# Patient Record
Sex: Female | Born: 1935 | Race: White | Hispanic: No | Smoking: Never smoker
Health system: Southern US, Community
[De-identification: ages and names within clinical notes are randomized; demographics above are authoritative.]

## PROBLEM LIST (undated history)

## (undated) DIAGNOSIS — F419 Anxiety disorder, unspecified: Secondary | ICD-10-CM

## (undated) DIAGNOSIS — I10 Essential (primary) hypertension: Secondary | ICD-10-CM

## (undated) DIAGNOSIS — Z950 Presence of cardiac pacemaker: Secondary | ICD-10-CM

## (undated) DIAGNOSIS — I340 Nonrheumatic mitral (valve) insufficiency: Secondary | ICD-10-CM

## (undated) DIAGNOSIS — I4891 Unspecified atrial fibrillation: Secondary | ICD-10-CM

## (undated) DIAGNOSIS — M1990 Unspecified osteoarthritis, unspecified site: Secondary | ICD-10-CM

## (undated) DIAGNOSIS — E785 Hyperlipidemia, unspecified: Secondary | ICD-10-CM

## (undated) DIAGNOSIS — E119 Type 2 diabetes mellitus without complications: Secondary | ICD-10-CM

## (undated) DIAGNOSIS — N189 Chronic kidney disease, unspecified: Secondary | ICD-10-CM

## (undated) DIAGNOSIS — F32A Depression, unspecified: Secondary | ICD-10-CM

## (undated) DIAGNOSIS — H3530 Unspecified macular degeneration: Secondary | ICD-10-CM

## (undated) DIAGNOSIS — K5792 Diverticulitis of intestine, part unspecified, without perforation or abscess without bleeding: Secondary | ICD-10-CM

## (undated) DIAGNOSIS — D649 Anemia, unspecified: Secondary | ICD-10-CM

## (undated) DIAGNOSIS — C50919 Malignant neoplasm of unspecified site of unspecified female breast: Secondary | ICD-10-CM

## (undated) DIAGNOSIS — K219 Gastro-esophageal reflux disease without esophagitis: Secondary | ICD-10-CM

## (undated) DIAGNOSIS — I639 Cerebral infarction, unspecified: Secondary | ICD-10-CM

## (undated) DIAGNOSIS — I499 Cardiac arrhythmia, unspecified: Secondary | ICD-10-CM

## (undated) DIAGNOSIS — I509 Heart failure, unspecified: Secondary | ICD-10-CM

## (undated) DIAGNOSIS — I341 Nonrheumatic mitral (valve) prolapse: Secondary | ICD-10-CM

## (undated) HISTORY — PX: INSERT / REPLACE / REMOVE PACEMAKER: SUR710

## (undated) HISTORY — PX: APPENDECTOMY: SHX54

## (undated) HISTORY — PX: CARDIAC CATHETERIZATION: SHX172

## (undated) HISTORY — DX: Chronic kidney disease, unspecified: N18.9

## (undated) HISTORY — DX: Anxiety disorder, unspecified: F41.9

## (undated) HISTORY — PX: TONSILLECTOMY: SUR1361

## (undated) HISTORY — PX: BREAST SURGERY: SHX581

## (undated) HISTORY — PX: EYE SURGERY: SHX253

## (undated) HISTORY — PX: ABDOMINAL HYSTERECTOMY: SHX81

## (undated) HISTORY — PX: LEFT OOPHORECTOMY: SHX1961

## (undated) HISTORY — DX: Essential (primary) hypertension: I10

## (undated) HISTORY — DX: Cerebral infarction, unspecified: I63.9

## (undated) HISTORY — DX: Depression, unspecified: F32.A

## (undated) HISTORY — PX: KNEE ARTHROSCOPY: SUR90

## (undated) HISTORY — PX: ESOPHAGEAL DILATION: SHX303

## (undated) HISTORY — PX: TEMPORAL ARTERY BIOPSY / LIGATION: SUR132

---

## 1976-04-06 DIAGNOSIS — C50919 Malignant neoplasm of unspecified site of unspecified female breast: Secondary | ICD-10-CM

## 1976-04-06 HISTORY — PX: MASTECTOMY: SHX3

## 1976-04-06 HISTORY — DX: Malignant neoplasm of unspecified site of unspecified female breast: C50.919

## 2004-06-25 ENCOUNTER — Ambulatory Visit: Payer: Self-pay | Admitting: Unknown Physician Specialty

## 2005-07-02 ENCOUNTER — Ambulatory Visit: Payer: Self-pay | Admitting: Internal Medicine

## 2007-10-03 ENCOUNTER — Ambulatory Visit: Payer: Self-pay | Admitting: Internal Medicine

## 2009-07-30 ENCOUNTER — Ambulatory Visit: Payer: Self-pay | Admitting: Internal Medicine

## 2009-12-23 ENCOUNTER — Ambulatory Visit: Payer: Self-pay | Admitting: Ophthalmology

## 2009-12-31 ENCOUNTER — Ambulatory Visit: Payer: Self-pay | Admitting: Ophthalmology

## 2010-01-03 ENCOUNTER — Ambulatory Visit: Payer: Self-pay | Admitting: Cardiovascular Disease

## 2010-12-10 ENCOUNTER — Ambulatory Visit: Payer: Self-pay | Admitting: Unknown Physician Specialty

## 2012-08-08 ENCOUNTER — Ambulatory Visit: Payer: Self-pay | Admitting: Cardiology

## 2012-08-08 DIAGNOSIS — I4891 Unspecified atrial fibrillation: Secondary | ICD-10-CM

## 2012-08-08 LAB — BASIC METABOLIC PANEL
Anion Gap: 18 — ABNORMAL HIGH (ref 7–16)
BUN: 18 mg/dL (ref 7–18)
Calcium, Total: 10.3 mg/dL — ABNORMAL HIGH (ref 8.5–10.1)
Chloride: 103 mmol/L (ref 98–107)
Co2: 17 mmol/L — ABNORMAL LOW (ref 21–32)
Creatinine: 0.72 mg/dL (ref 0.60–1.30)
EGFR (African American): >60
EGFR (Non-African Amer.): >60
Glucose: 228 mg/dL — ABNORMAL HIGH (ref 65–99)
Osmolality: 285 (ref 275–301)
Potassium: 4.2 mmol/L (ref 3.5–5.1)
Sodium: 138 mmol/L (ref 136–145)

## 2012-08-08 LAB — CBC WITH DIFFERENTIAL/PLATELET
Basophil #: 0.1 10*3/uL (ref 0.0–0.1)
Basophil %: 0.8 %
Eosinophil #: 0.1 10*3/uL (ref 0.0–0.7)
Eosinophil %: 1.2 %
HCT: 37.2 % (ref 35.0–47.0)
HGB: 12.6 g/dL (ref 12.0–16.0)
Lymphocyte #: 1.4 10*3/uL (ref 1.0–3.6)
Lymphocyte %: 20.2 %
MCH: 28.4 pg (ref 26.0–34.0)
MCHC: 34 g/dL (ref 32.0–36.0)
MCV: 84 fL (ref 80–100)
Monocyte #: 0.6 x10 3/mm (ref 0.2–0.9)
Monocyte %: 9 %
Neutrophil #: 4.6 10*3/uL (ref 1.4–6.5)
Neutrophil %: 68.8 %
Platelet: 258 10*3/uL (ref 150–440)
RBC: 4.45 10*6/uL (ref 3.80–5.20)
RDW: 13.9 % (ref 11.5–14.5)
WBC: 6.7 10*3/uL (ref 3.6–11.0)

## 2012-08-08 LAB — APTT: Activated PTT: 33.1 secs (ref 23.6–35.9)

## 2012-08-08 LAB — PROTIME-INR
INR: 1.4
Prothrombin Time: 17.3 secs — ABNORMAL HIGH (ref 11.5–14.7)

## 2012-08-11 ENCOUNTER — Ambulatory Visit: Payer: Self-pay | Admitting: Cardiology

## 2012-08-11 HISTORY — PX: PACEMAKER INSERTION: SHX728

## 2012-08-11 LAB — PROTIME-INR
INR: 1
Prothrombin Time: 13.2 secs (ref 11.5–14.7)

## 2012-09-30 DIAGNOSIS — I5030 Unspecified diastolic (congestive) heart failure: Secondary | ICD-10-CM | POA: Insufficient documentation

## 2012-09-30 DIAGNOSIS — I509 Heart failure, unspecified: Secondary | ICD-10-CM | POA: Insufficient documentation

## 2012-09-30 DIAGNOSIS — I340 Nonrheumatic mitral (valve) insufficiency: Secondary | ICD-10-CM | POA: Insufficient documentation

## 2012-11-29 ENCOUNTER — Encounter: Payer: Self-pay | Admitting: Internal Medicine

## 2012-12-05 ENCOUNTER — Encounter: Payer: Self-pay | Admitting: Internal Medicine

## 2013-01-04 ENCOUNTER — Encounter: Payer: Self-pay | Admitting: Internal Medicine

## 2013-01-30 ENCOUNTER — Ambulatory Visit: Payer: Self-pay | Admitting: Unknown Physician Specialty

## 2013-02-01 LAB — PATHOLOGY REPORT

## 2013-02-04 ENCOUNTER — Encounter: Payer: Self-pay | Admitting: Internal Medicine

## 2013-03-06 ENCOUNTER — Encounter: Payer: Self-pay | Admitting: Internal Medicine

## 2013-04-06 ENCOUNTER — Encounter: Payer: Self-pay | Admitting: Internal Medicine

## 2013-05-07 ENCOUNTER — Encounter: Payer: Self-pay | Admitting: Internal Medicine

## 2013-08-10 DIAGNOSIS — I4891 Unspecified atrial fibrillation: Secondary | ICD-10-CM | POA: Insufficient documentation

## 2013-10-26 DIAGNOSIS — I495 Sick sinus syndrome: Secondary | ICD-10-CM | POA: Insufficient documentation

## 2013-10-26 DIAGNOSIS — I341 Nonrheumatic mitral (valve) prolapse: Secondary | ICD-10-CM | POA: Insufficient documentation

## 2013-10-26 DIAGNOSIS — E119 Type 2 diabetes mellitus without complications: Secondary | ICD-10-CM | POA: Insufficient documentation

## 2013-11-22 ENCOUNTER — Ambulatory Visit: Payer: Self-pay | Admitting: Ophthalmology

## 2013-12-18 ENCOUNTER — Ambulatory Visit: Payer: Self-pay | Admitting: Internal Medicine

## 2014-03-15 ENCOUNTER — Ambulatory Visit: Payer: Self-pay | Admitting: Internal Medicine

## 2014-04-03 ENCOUNTER — Ambulatory Visit: Payer: Self-pay | Admitting: Unknown Physician Specialty

## 2014-06-12 DIAGNOSIS — E782 Mixed hyperlipidemia: Secondary | ICD-10-CM | POA: Insufficient documentation

## 2014-07-27 NOTE — Op Note (Signed)
PATIENT NAME:  Elizabeth Novak, Elizabeth Novak MR#:  [id number] DATE OF BIRTH:  10-24-1923  DATE OF PROCEDURE:  08/11/2012  PRIMARY CARE PHYSICIAN:  Emily Filbert, MD   PREPROCEDURE DIAGNOSES:  1.  Sick sinus syndrome.  2.  Elective replacement indication.   PROCEDURE: Single-chamber pacemaker generator change out.   POSTPROCEDURE DIAGNOSIS: Intermittent ventricular pacing.   INDICATION: The patient is an 81 year old female status post single chamber pacemaker for sick sinus syndrome. Recent pacemaker interrogation has shown the pacemaker was at elective replacement indication. The procedure, risks, benefits, and alternatives of pacemaker generator change out were explained to the patient and informed written consent was obtained.   DESCRIPTION OF PROCEDURE: She was brought to the operating room in a fasting state. The left pectoral region was prepped and draped in the usual sterile manner. A 6 cm incision was performed over the old pacemaker generator site. The old pacemaker generator was retrieved by electrocautery and blunt dissection. The ventricular lead was disconnected from the old pacemaker generator and interrogated. After proper thresholds were obtained, the leads were connected to a new single-chamber rate responsive pacemaker generator ([employer] Adapta [id number]).  The pacemaker pocket was irrigated with gentamicin solution. The new pacemaker generator was positioned into the pocket. The pocket was closed with 2-0 and 4-0 Vicryl, respectively. Steri-Strips and pressure dressing were applied.  ____________________________ Isaias Cowman, MD ap:sb D: 08/11/2012 13:58:29 ET T: 08/11/2012 14:15:07 ET JOB#: [id number]  cc: Isaias Cowman, MD, <Dictator> Isaias Cowman MD ELECTRONICALLY SIGNED 08/15/2012 13:48

## 2014-08-27 ENCOUNTER — Other Ambulatory Visit: Payer: Self-pay | Admitting: Internal Medicine

## 2014-08-27 DIAGNOSIS — M5416 Radiculopathy, lumbar region: Secondary | ICD-10-CM

## 2014-08-31 ENCOUNTER — Ambulatory Visit
Admission: RE | Admit: 2014-08-31 | Discharge: 2014-08-31 | Disposition: A | Discharge status: Home / Self Care | Payer: Medicare Other | Source: Ambulatory Visit | Attending: Internal Medicine | Admitting: Internal Medicine

## 2014-08-31 DIAGNOSIS — M2578 Osteophyte, vertebrae: Secondary | ICD-10-CM | POA: Diagnosis not present

## 2014-08-31 DIAGNOSIS — M5386 Other specified dorsopathies, lumbar region: Secondary | ICD-10-CM | POA: Diagnosis not present

## 2014-08-31 DIAGNOSIS — M4807 Spinal stenosis, lumbosacral region: Secondary | ICD-10-CM | POA: Diagnosis not present

## 2014-08-31 DIAGNOSIS — M5416 Radiculopathy, lumbar region: Secondary | ICD-10-CM | POA: Diagnosis present

## 2014-08-31 DIAGNOSIS — M47896 Other spondylosis, lumbar region: Secondary | ICD-10-CM | POA: Diagnosis not present

## 2014-11-15 ENCOUNTER — Other Ambulatory Visit
Admission: RE | Admit: 2014-11-15 | Discharge: 2014-11-15 | Disposition: A | Discharge status: Home / Self Care | Payer: Medicare Other | Source: Ambulatory Visit | Attending: Ophthalmology | Admitting: Ophthalmology

## 2014-11-15 DIAGNOSIS — H02409 Unspecified ptosis of unspecified eyelid: Secondary | ICD-10-CM | POA: Insufficient documentation

## 2015-01-01 LAB — MISC LABCORP TEST (SEND OUT)
LabCorp test name: 85933
Labcorp test code: 85933

## 2015-06-25 IMAGING — RF DG BARIUM SWALLOW
9 of 11 series · 14 of 20 positions shown · non-contrast
Comparison: None.

CLINICAL DATA: Dysphasia.

EXAM:
ESOPHOGRAM/BARIUM SWALLOW
TECHNIQUE: Combined double contrast and single contrast examination performed
using effervescent crystals, thick barium liquid, and thin barium
liquid.
FLUOROSCOPY TIME:  1 min 30 seconds.

[Series 1: fluoro_barium 2fps_bw · 0.18mm/px · 1 of 2 frames shown (1 of 9)]
[frame 1/2]
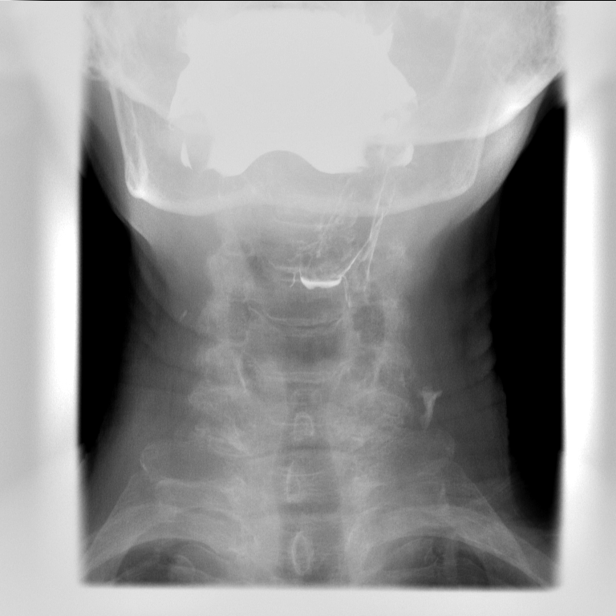

[Series 2: fluoro_barium 2fps_bw · 0.18mm/px · 3 of 12 frames shown (2 of 9)]
[frame 2/12]
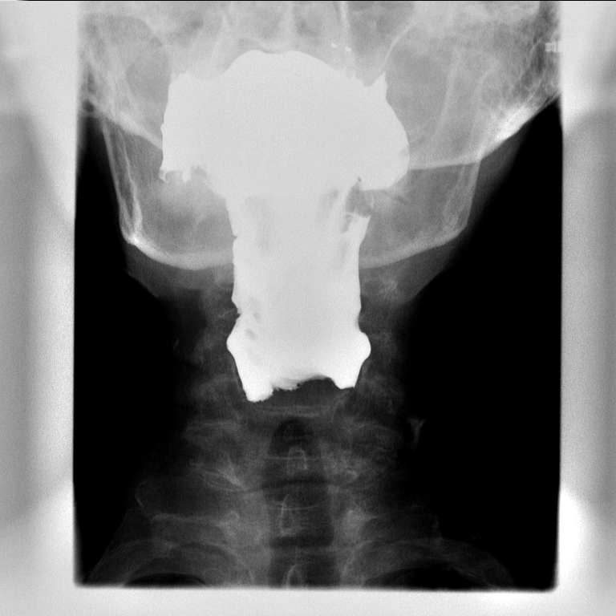
[frame 4/12]
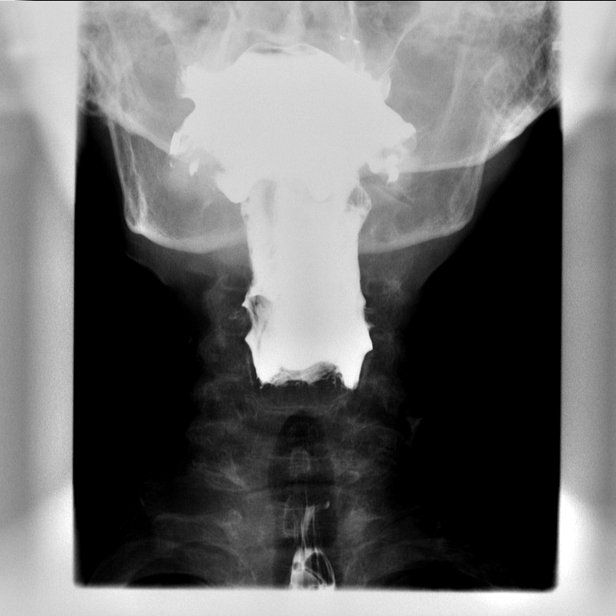
[frame 11/12]
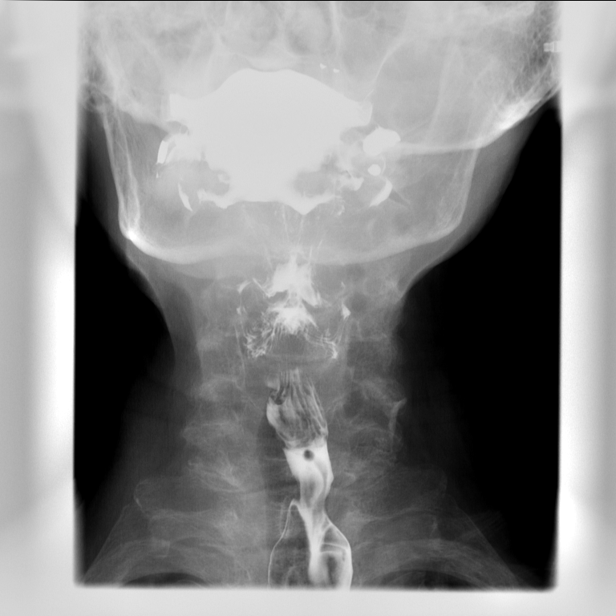

[Series 3: fluoro_barium 2fps_bw · 0.18mm/px · 3 of 10 frames shown (3 of 9)]
[frame 2/10]
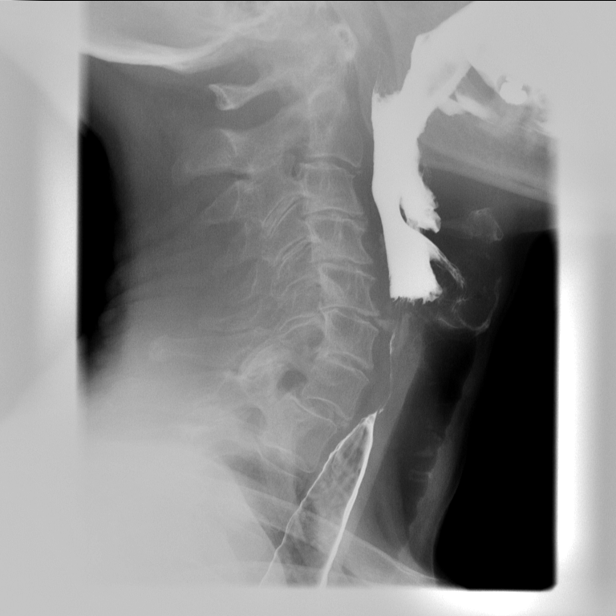
[frame 6/10]
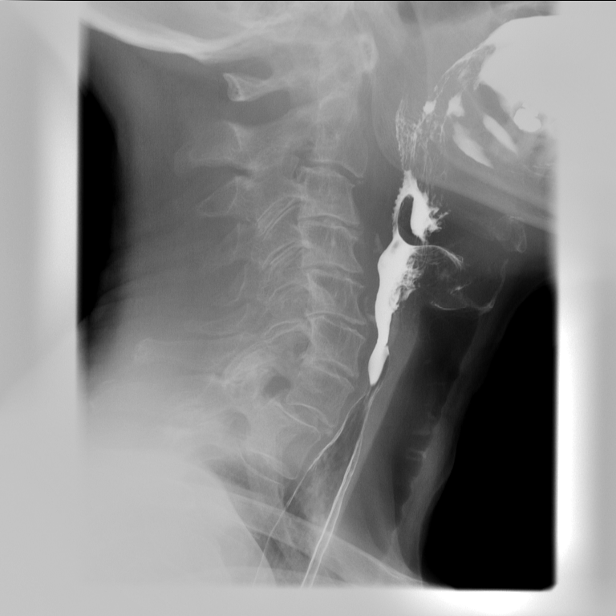
[frame 9/10]
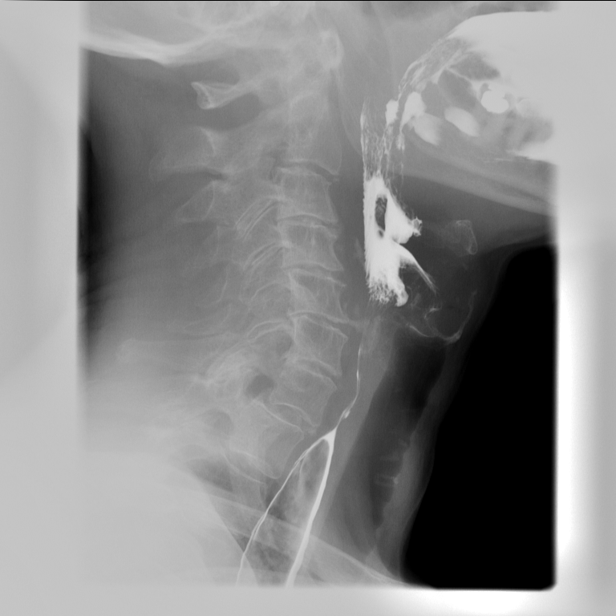

[Series 4: fluoro_barium 2fps_bw · 0.18mm/px · 1 of 1 slices shown (4 of 9)]
[im 1/1]
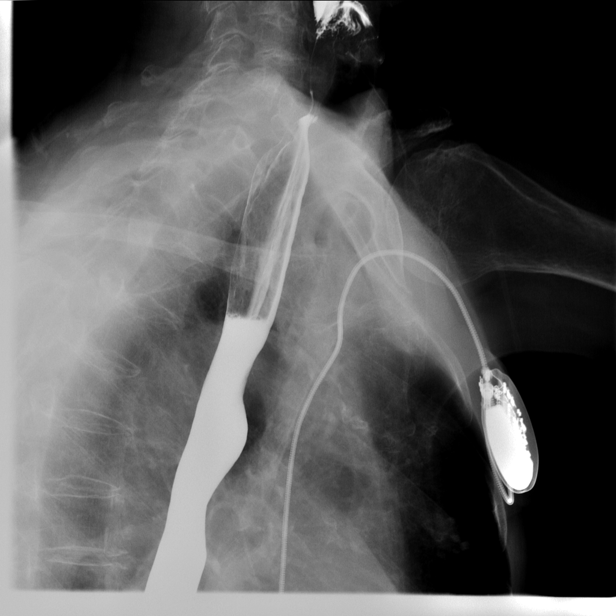

[Series 6: fluoro_barium 2fps_bw · 0.18mm/px · 1 of 1 slices shown (5 of 9)]
[im 1/1]
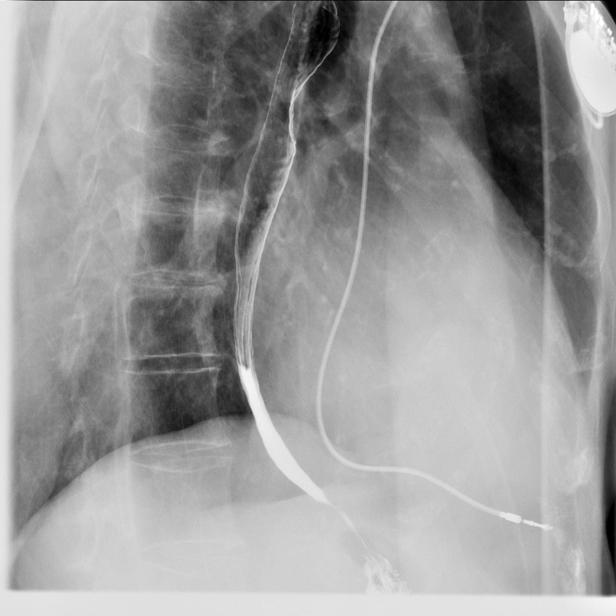

[Series 7: fluoro_barium 2fps_bw · 0.18mm/px · 1 of 1 slices shown (6 of 9)]
[im 1/1]
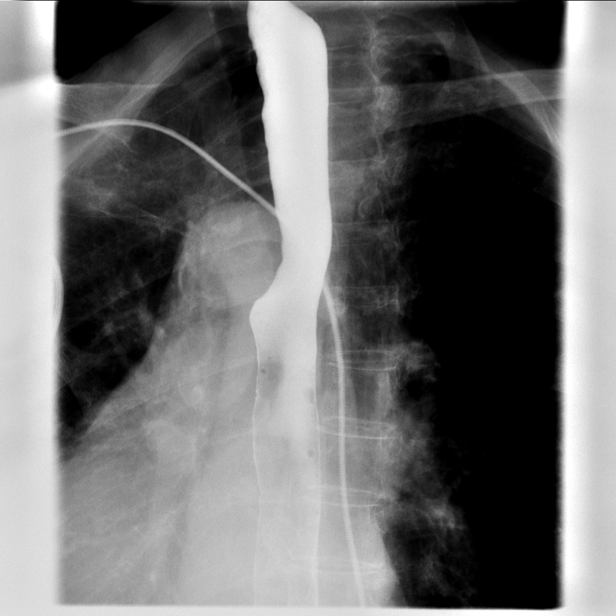

[Series 9: fluoro_barium 2fps_bw · 0.19mm/px · 1 of 1 slices shown (7 of 9)]
[im 1/1]
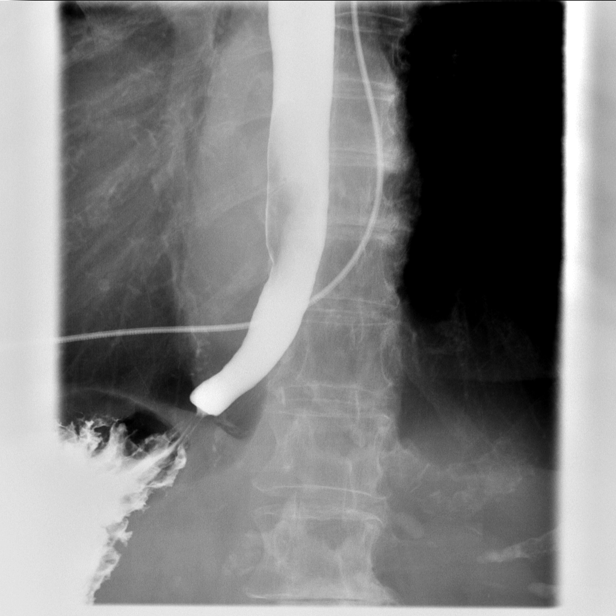

[Series 10: fluoro_barium 2fps_bw · 0.19mm/px · 1 of 1 slices shown (8 of 9)]
[im 1/1]
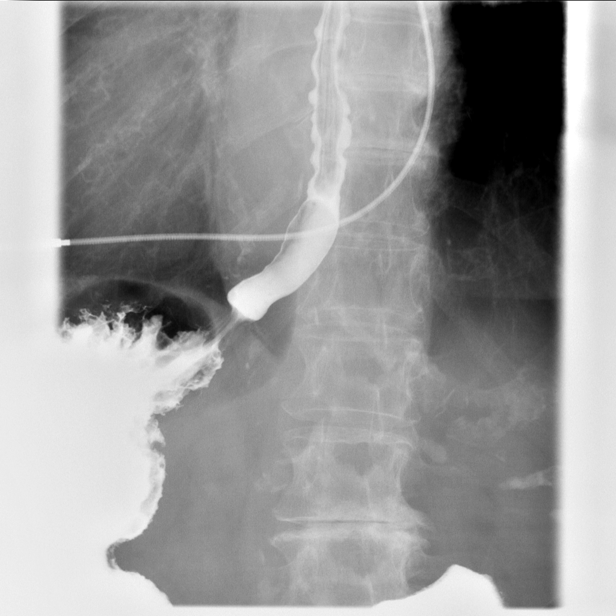

[Series 11: fluoro_barium 2fps_bw · 0.20mm/px · 2 of 4 frames shown (9 of 9)]
[frame 1/4]
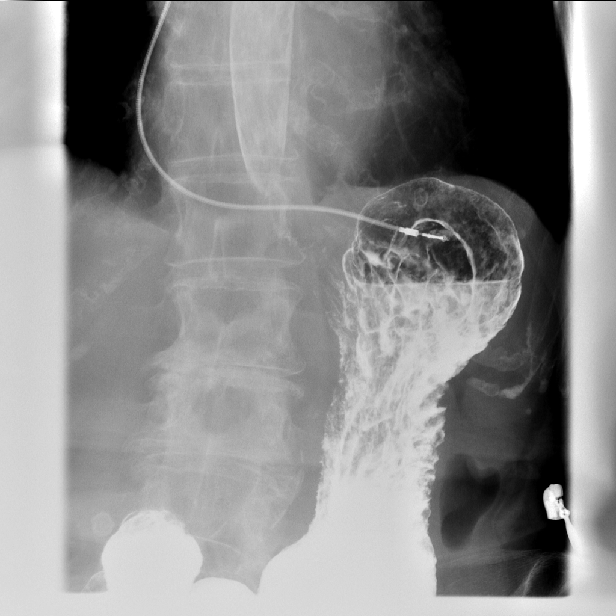
[frame 4/4]
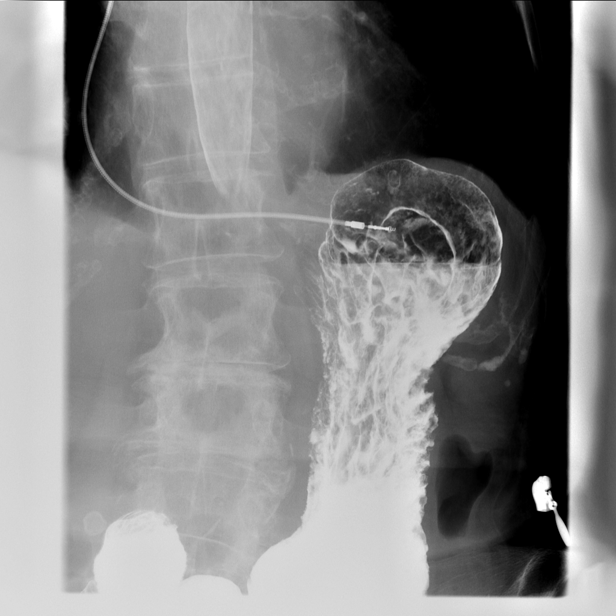

[14 of 20 positions shown; findings below may reference images not displayed]

FINDINGS: Minimal laryngeal penetration noted. No evidence of frank
aspiration. Mild deformity of the posterior sulci is noted secondary
prominent vertebral endplate osteophytes. Minimal irregularity noted
of the anterior cervical esophagus. Endoscopic evaluation this
region should be considered to exclude a focal ulceration or lesion.

Mild tertiary esophageal contractions are noted consistent present
esophagus. No hiatal hernia or reflux. No thoracic esophageal mass
lesion. Standardized barium tablet passed easily into the stomach.
IMPRESSION: 1. Minimal laryngeal penetration noted. No evidence of frank
aspiration.
2. Mild deformity the posterior wall esophagus is noted secondary to
prominent vertebral endplate osteophytes.
3. Mild irregularity noted of the anterior cervical esophagus.
Endoscopic evaluation of this region should be considered to exclude
a focal ulceration or lesion.
4. Presbyesophagus.  No reflux.

## 2015-06-30 ENCOUNTER — Emergency Department: Payer: Medicare Other

## 2015-06-30 ENCOUNTER — Observation Stay: Payer: Medicare Other

## 2015-06-30 ENCOUNTER — Observation Stay
Admission: EM | Admit: 2015-06-30 | Discharge: 2015-07-03 | Disposition: A | Discharge status: Skilled Nursing Facility | Payer: Medicare Other | Attending: Internal Medicine | Admitting: Internal Medicine

## 2015-06-30 DIAGNOSIS — Z79899 Other long term (current) drug therapy: Secondary | ICD-10-CM | POA: Insufficient documentation

## 2015-06-30 DIAGNOSIS — Z9841 Cataract extraction status, right eye: Secondary | ICD-10-CM | POA: Insufficient documentation

## 2015-06-30 DIAGNOSIS — T148XXA Other injury of unspecified body region, initial encounter: Secondary | ICD-10-CM

## 2015-06-30 DIAGNOSIS — E785 Hyperlipidemia, unspecified: Secondary | ICD-10-CM | POA: Insufficient documentation

## 2015-06-30 DIAGNOSIS — I509 Heart failure, unspecified: Secondary | ICD-10-CM | POA: Diagnosis not present

## 2015-06-30 DIAGNOSIS — T148 Other injury of unspecified body region: Secondary | ICD-10-CM | POA: Diagnosis present

## 2015-06-30 DIAGNOSIS — S8012XA Contusion of left lower leg, initial encounter: Secondary | ICD-10-CM | POA: Diagnosis not present

## 2015-06-30 DIAGNOSIS — Z888 Allergy status to other drugs, medicaments and biological substances status: Secondary | ICD-10-CM | POA: Insufficient documentation

## 2015-06-30 DIAGNOSIS — S8261XA Displaced fracture of lateral malleolus of right fibula, initial encounter for closed fracture: Secondary | ICD-10-CM | POA: Insufficient documentation

## 2015-06-30 DIAGNOSIS — E119 Type 2 diabetes mellitus without complications: Secondary | ICD-10-CM | POA: Diagnosis not present

## 2015-06-30 DIAGNOSIS — Z7901 Long term (current) use of anticoagulants: Secondary | ICD-10-CM | POA: Insufficient documentation

## 2015-06-30 DIAGNOSIS — I081 Rheumatic disorders of both mitral and tricuspid valves: Secondary | ICD-10-CM | POA: Insufficient documentation

## 2015-06-30 DIAGNOSIS — M1990 Unspecified osteoarthritis, unspecified site: Secondary | ICD-10-CM | POA: Insufficient documentation

## 2015-06-30 DIAGNOSIS — R079 Chest pain, unspecified: Principal | ICD-10-CM | POA: Insufficient documentation

## 2015-06-30 DIAGNOSIS — Z9842 Cataract extraction status, left eye: Secondary | ICD-10-CM | POA: Diagnosis not present

## 2015-06-30 DIAGNOSIS — M1711 Unilateral primary osteoarthritis, right knee: Secondary | ICD-10-CM | POA: Insufficient documentation

## 2015-06-30 DIAGNOSIS — K8020 Calculus of gallbladder without cholecystitis without obstruction: Secondary | ICD-10-CM | POA: Insufficient documentation

## 2015-06-30 DIAGNOSIS — I2510 Atherosclerotic heart disease of native coronary artery without angina pectoris: Secondary | ICD-10-CM | POA: Insufficient documentation

## 2015-06-30 DIAGNOSIS — M25571 Pain in right ankle and joints of right foot: Secondary | ICD-10-CM

## 2015-06-30 DIAGNOSIS — R531 Weakness: Secondary | ICD-10-CM | POA: Insufficient documentation

## 2015-06-30 DIAGNOSIS — I4891 Unspecified atrial fibrillation: Secondary | ICD-10-CM | POA: Diagnosis not present

## 2015-06-30 DIAGNOSIS — I110 Hypertensive heart disease with heart failure: Secondary | ICD-10-CM | POA: Insufficient documentation

## 2015-06-30 DIAGNOSIS — R200 Anesthesia of skin: Secondary | ICD-10-CM | POA: Insufficient documentation

## 2015-06-30 DIAGNOSIS — Z9010 Acquired absence of unspecified breast and nipple: Secondary | ICD-10-CM | POA: Insufficient documentation

## 2015-06-30 DIAGNOSIS — E041 Nontoxic single thyroid nodule: Secondary | ICD-10-CM | POA: Insufficient documentation

## 2015-06-30 DIAGNOSIS — J90 Pleural effusion, not elsewhere classified: Secondary | ICD-10-CM | POA: Insufficient documentation

## 2015-06-30 DIAGNOSIS — I495 Sick sinus syndrome: Secondary | ICD-10-CM | POA: Diagnosis not present

## 2015-06-30 DIAGNOSIS — J449 Chronic obstructive pulmonary disease, unspecified: Secondary | ICD-10-CM | POA: Insufficient documentation

## 2015-06-30 DIAGNOSIS — R52 Pain, unspecified: Secondary | ICD-10-CM | POA: Insufficient documentation

## 2015-06-30 DIAGNOSIS — Z950 Presence of cardiac pacemaker: Secondary | ICD-10-CM | POA: Insufficient documentation

## 2015-06-30 DIAGNOSIS — M542 Cervicalgia: Secondary | ICD-10-CM | POA: Insufficient documentation

## 2015-06-30 DIAGNOSIS — Z7984 Long term (current) use of oral hypoglycemic drugs: Secondary | ICD-10-CM | POA: Insufficient documentation

## 2015-06-30 DIAGNOSIS — I517 Cardiomegaly: Secondary | ICD-10-CM | POA: Insufficient documentation

## 2015-06-30 DIAGNOSIS — Z853 Personal history of malignant neoplasm of breast: Secondary | ICD-10-CM | POA: Diagnosis not present

## 2015-06-30 DIAGNOSIS — Z8249 Family history of ischemic heart disease and other diseases of the circulatory system: Secondary | ICD-10-CM | POA: Insufficient documentation

## 2015-06-30 DIAGNOSIS — Z885 Allergy status to narcotic agent status: Secondary | ICD-10-CM | POA: Insufficient documentation

## 2015-06-30 DIAGNOSIS — I340 Nonrheumatic mitral (valve) insufficiency: Secondary | ICD-10-CM | POA: Insufficient documentation

## 2015-06-30 HISTORY — DX: Nonrheumatic mitral (valve) prolapse: I34.1

## 2015-06-30 HISTORY — DX: Nonrheumatic mitral (valve) insufficiency: I34.0

## 2015-06-30 HISTORY — DX: Type 2 diabetes mellitus without complications: E11.9

## 2015-06-30 HISTORY — DX: Heart failure, unspecified: I50.9

## 2015-06-30 HISTORY — DX: Hyperlipidemia, unspecified: E78.5

## 2015-06-30 HISTORY — DX: Unspecified osteoarthritis, unspecified site: M19.90

## 2015-06-30 HISTORY — DX: Presence of cardiac pacemaker: Z95.0

## 2015-06-30 HISTORY — DX: Unspecified atrial fibrillation: I48.91

## 2015-06-30 HISTORY — DX: Malignant neoplasm of unspecified site of unspecified female breast: C50.919

## 2015-06-30 LAB — COMPREHENSIVE METABOLIC PANEL
ALT: 39 U/L (ref 14–54)
AST: 52 U/L — ABNORMAL HIGH (ref 15–41)
Albumin: 4.1 g/dL (ref 3.5–5.0)
Alkaline Phosphatase: 51 U/L (ref 38–126)
Anion gap: 7 (ref 5–15)
BUN: 16 mg/dL (ref 6–20)
CO2: 29 mmol/L (ref 22–32)
Calcium: 9.9 mg/dL (ref 8.9–10.3)
Chloride: 100 mmol/L — ABNORMAL LOW (ref 101–111)
Creatinine, Ser: 0.69 mg/dL (ref 0.44–1.00)
GFR calc Af Amer: >60 mL/min (ref >60)
GFR calc non Af Amer: >60 mL/min (ref >60)
Glucose, Bld: 144 mg/dL — ABNORMAL HIGH (ref 65–99)
Potassium: 4.1 mmol/L (ref 3.5–5.1)
Sodium: 136 mmol/L (ref 135–145)
Total Bilirubin: 0.9 mg/dL (ref 0.3–1.2)
Total Protein: 6.8 g/dL (ref 6.5–8.1)

## 2015-06-30 LAB — CBC WITH DIFFERENTIAL/PLATELET
Basophils Absolute: 0 10*3/uL (ref 0–0.1)
Basophils Relative: 1 %
Eosinophils Absolute: 0.1 10*3/uL (ref 0–0.7)
Eosinophils Relative: 1 %
HCT: 37.3 % (ref 35.0–47.0)
Hemoglobin: 12.6 g/dL (ref 12.0–16.0)
Lymphocytes Relative: 13 %
Lymphs Abs: 1 10*3/uL (ref 1.0–3.6)
MCH: 28.9 pg (ref 26.0–34.0)
MCHC: 33.7 g/dL (ref 32.0–36.0)
MCV: 85.7 fL (ref 80.0–100.0)
Monocytes Absolute: 0.6 10*3/uL (ref 0.2–0.9)
Monocytes Relative: 7 %
Neutro Abs: 6.3 10*3/uL (ref 1.4–6.5)
Neutrophils Relative %: 78 %
Platelets: 247 10*3/uL (ref 150–440)
RBC: 4.36 MIL/uL (ref 3.80–5.20)
RDW: 14.2 % (ref 11.5–14.5)
WBC: 8 10*3/uL (ref 3.6–11.0)

## 2015-06-30 LAB — GLUCOSE, CAPILLARY
Glucose-Capillary: 159 mg/dL — ABNORMAL HIGH (ref 65–99)
Glucose-Capillary: 180 mg/dL — ABNORMAL HIGH (ref 65–99)

## 2015-06-30 LAB — PROTIME-INR
INR: 1.55
Prothrombin Time: 18.6 seconds — ABNORMAL HIGH (ref 11.4–15.0)

## 2015-06-30 LAB — TROPONIN I
Troponin I: <0.03 ng/mL (ref <0.031)
Troponin I: <0.03 ng/mL (ref <0.031)

## 2015-06-30 LAB — APTT: aPTT: 34 seconds (ref 24–36)

## 2015-06-30 MED ORDER — MAGNESIUM OXIDE 400 (241.3 MG) MG PO TABS
400.0000 mg | ORAL_TABLET | Freq: Every day | ORAL | Status: DC
  Administered 2015-07-01 – 2015-07-03 (×3): 400 mg via ORAL
  Filled 2015-06-30 (×3): qty 1

## 2015-06-30 MED ORDER — METOPROLOL SUCCINATE ER 50 MG PO TB24: 75.0000 mg | ORAL_TABLET | Freq: Every day | ORAL | Status: DC

## 2015-06-30 MED ORDER — FENTANYL CITRATE (PF) 100 MCG/2ML IJ SOLN: 25.0000 ug | Freq: Four times a day (QID) | INTRAMUSCULAR | Status: DC | PRN

## 2015-06-30 MED ORDER — SODIUM CHLORIDE 0.9% FLUSH: 3.0000 mL | INTRAVENOUS | Status: DC | PRN

## 2015-06-30 MED ORDER — INSULIN ASPART 100 UNIT/ML ~~LOC~~ SOLN
0.0000 [IU] | Freq: Three times a day (TID) | SUBCUTANEOUS | Status: DC
  Administered 2015-07-01: 2 [IU] via SUBCUTANEOUS
  Administered 2015-07-01 – 2015-07-02 (×2): 3 [IU] via SUBCUTANEOUS
  Administered 2015-07-02: 2 [IU] via SUBCUTANEOUS
  Administered 2015-07-02: 1 [IU] via SUBCUTANEOUS
  Administered 2015-07-03 (×2): 2 [IU] via SUBCUTANEOUS
  Filled 2015-06-30 (×2): qty 2
  Filled 2015-06-30: qty 1
  Filled 2015-06-30: qty 3
  Filled 2015-06-30: qty 2
  Filled 2015-06-30: qty 3
  Filled 2015-06-30: qty 2

## 2015-06-30 MED ORDER — TORSEMIDE 20 MG PO TABS
30.0000 mg | ORAL_TABLET | Freq: Every day | ORAL | Status: DC
  Administered 2015-07-01 – 2015-07-03 (×3): 30 mg via ORAL
  Filled 2015-06-30 (×2): qty 2
  Filled 2015-06-30: qty 1

## 2015-06-30 MED ORDER — INSULIN ASPART 100 UNIT/ML ~~LOC~~ SOLN: 0.0000 [IU] | Freq: Every day | SUBCUTANEOUS | Status: DC

## 2015-06-30 MED ORDER — POTASSIUM CHLORIDE CRYS ER 10 MEQ PO TBCR
10.0000 meq | EXTENDED_RELEASE_TABLET | Freq: Four times a day (QID) | ORAL | Status: DC
  Administered 2015-06-30 – 2015-07-03 (×12): 10 meq via ORAL
  Filled 2015-06-30 (×12): qty 1

## 2015-06-30 MED ORDER — SODIUM CHLORIDE 0.9% FLUSH: 3.0000 mL | Freq: Two times a day (BID) | INTRAVENOUS | Status: DC

## 2015-06-30 MED ORDER — SODIUM CHLORIDE 0.9% FLUSH
3.0000 mL | Freq: Two times a day (BID) | INTRAVENOUS | Status: DC
  Administered 2015-06-30 – 2015-07-03 (×6): 3 mL via INTRAVENOUS

## 2015-06-30 MED ORDER — ONDANSETRON HCL 4 MG PO TABS: 4.0000 mg | ORAL_TABLET | Freq: Four times a day (QID) | ORAL | Status: DC | PRN

## 2015-06-30 MED ORDER — FLUTICASONE PROPIONATE 50 MCG/ACT NA SUSP
2.0000 | Freq: Every day | NASAL | Status: DC
  Administered 2015-07-01 – 2015-07-03 (×3): 2 via NASAL
  Filled 2015-06-30: qty 16

## 2015-06-30 MED ORDER — SENNOSIDES-DOCUSATE SODIUM 8.6-50 MG PO TABS
1.0000 | ORAL_TABLET | Freq: Two times a day (BID) | ORAL | Status: DC
  Administered 2015-06-30 – 2015-07-03 (×6): 1 via ORAL
  Filled 2015-06-30 (×4): qty 1

## 2015-06-30 MED ORDER — WARFARIN - PHARMACIST DOSING INPATIENT
Freq: Every day | Status: DC
  Administered 2015-07-01: 18:00:00
  Filled 2015-06-30 (×4): qty 1

## 2015-06-30 MED ORDER — SODIUM CHLORIDE 0.9 % IV SOLN: 250.0000 mL | INTRAVENOUS | Status: DC | PRN

## 2015-06-30 MED ORDER — ONDANSETRON HCL 4 MG/2ML IJ SOLN
4.0000 mg | Freq: Once | INTRAMUSCULAR | Status: AC
  Administered 2015-06-30: 4 mg via INTRAVENOUS
  Filled 2015-06-30: qty 2

## 2015-06-30 MED ORDER — SENNOSIDES-DOCUSATE SODIUM 8.6-50 MG PO TABS
1.0000 | ORAL_TABLET | Freq: Every evening | ORAL | Status: DC | PRN
  Filled 2015-06-30 (×3): qty 1

## 2015-06-30 MED ORDER — ALBUTEROL SULFATE (2.5 MG/3ML) 0.083% IN NEBU: 2.5000 mg | INHALATION_SOLUTION | RESPIRATORY_TRACT | Status: DC | PRN

## 2015-06-30 MED ORDER — ACETAMINOPHEN 650 MG RE SUPP: 650.0000 mg | Freq: Four times a day (QID) | RECTAL | Status: DC | PRN

## 2015-06-30 MED ORDER — SODIUM CHLORIDE 0.9 % IV BOLUS (SEPSIS)
500.0000 mL | Freq: Once | INTRAVENOUS | Status: AC
  Administered 2015-06-30: 500 mL via INTRAVENOUS

## 2015-06-30 MED ORDER — HYDROCORTISONE NA SUCCINATE PF 100 MG IJ SOLR
200.0000 mg | Freq: Once | INTRAMUSCULAR | Status: AC
  Administered 2015-06-30: 200 mg via INTRAVENOUS
  Filled 2015-06-30: qty 4

## 2015-06-30 MED ORDER — PANTOPRAZOLE SODIUM 40 MG PO TBEC
40.0000 mg | DELAYED_RELEASE_TABLET | Freq: Every day | ORAL | Status: DC
  Administered 2015-07-01 – 2015-07-03 (×3): 40 mg via ORAL
  Filled 2015-06-30 (×3): qty 1

## 2015-06-30 MED ORDER — FENTANYL CITRATE (PF) 100 MCG/2ML IJ SOLN
50.0000 ug | Freq: Once | INTRAMUSCULAR | Status: AC
  Administered 2015-06-30: 50 ug via INTRAVENOUS
  Filled 2015-06-30: qty 2

## 2015-06-30 MED ORDER — ACETAMINOPHEN 325 MG PO TABS: 650.0000 mg | ORAL_TABLET | Freq: Four times a day (QID) | ORAL | Status: DC | PRN

## 2015-06-30 MED ORDER — ONDANSETRON HCL 4 MG/2ML IJ SOLN: 4.0000 mg | Freq: Four times a day (QID) | INTRAMUSCULAR | Status: DC | PRN

## 2015-06-30 MED ORDER — WARFARIN SODIUM 3 MG PO TABS
3.0000 mg | ORAL_TABLET | Freq: Once | ORAL | Status: AC
  Administered 2015-06-30: 3 mg via ORAL
  Filled 2015-06-30 (×2): qty 1

## 2015-06-30 MED ORDER — DIGOXIN 125 MCG PO TABS: 125.0000 ug | ORAL_TABLET | Freq: Every day | ORAL | Status: DC

## 2015-06-30 MED ORDER — IOPAMIDOL (ISOVUE-300) INJECTION 61%
100.0000 mL | Freq: Once | INTRAVENOUS | Status: AC | PRN
  Administered 2015-06-30: 100 mL via INTRAVENOUS

## 2015-06-30 MED ORDER — DIPHENHYDRAMINE HCL 50 MG/ML IJ SOLN
50.0000 mg | Freq: Once | INTRAMUSCULAR | Status: AC
  Administered 2015-06-30: 50 mg via INTRAVENOUS
  Filled 2015-06-30: qty 1

## 2015-06-30 MED ORDER — MORPHINE SULFATE (PF) 4 MG/ML IV SOLN
4.0000 mg | INTRAVENOUS | Status: DC | PRN
  Administered 2015-06-30 – 2015-07-03 (×4): 4 mg via INTRAVENOUS
  Filled 2015-06-30 (×4): qty 1

## 2015-06-30 MED ORDER — HYDROCODONE-ACETAMINOPHEN 5-325 MG PO TABS
1.0000 | ORAL_TABLET | ORAL | Status: DC | PRN
  Administered 2015-06-30 (×3): 1 via ORAL
  Administered 2015-07-01 – 2015-07-03 (×11): 2 via ORAL
  Filled 2015-06-30: qty 2
  Filled 2015-06-30: qty 1
  Filled 2015-06-30 (×2): qty 2
  Filled 2015-06-30: qty 1
  Filled 2015-06-30 (×3): qty 2
  Filled 2015-06-30: qty 1
  Filled 2015-06-30 (×5): qty 2
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
## 2015-06-30 NOTE — ED Notes (Signed)
Pt c/o left leg pain. Swelling noted to left lower leg. MD notified. Pt will go to xray.

## 2015-06-30 NOTE — Care Management Obs Status (Signed)
[facility] NOTIFICATION   Patient Details  Name: Caroline Twiddy MRN: [id number] Date of Birth: Dec 07, 1923   Medicare Observation Status Notification Given:  Yes (signed by POA)    Ival Bible, RN 06/30/2015, 3:23 PM

## 2015-06-30 NOTE — ED Notes (Signed)
Pt transported to CT scan.
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
## 2015-06-30 NOTE — Consult Note (Signed)
ANTICOAGULATION CONSULT NOTE - Initial Consult  Pharmacy Consult for warfarin Indication: atrial fibrillation  Allergies  Allergen Reactions  . Codeine Nausea Only  . Disopyramide     Other reaction(s): Unknown  . Ibuprofen Diarrhea  . Iodine     blisters  . Quinidine     Other reaction(s): Unknown  . Terfenadine     Other reaction(s): Unknown  . Topiramate     Other reaction(s): Other (See Comments) Hair loss  . Verapamil     Other reaction(s): Unknown    Patient Measurements:   Heparin Dosing Weight:   Vital Signs: BP: 102/58 mmHg (03/26 1632) Pulse Rate: 98 (03/26 1632)  Labs:  Recent Labs  06/30/15 1047  HGB 12.6  HCT 37.3  PLT 247  APTT 34  LABPROT 18.6*  INR 1.55  CREATININE 0.69  TROPONINI <0.03    CrCl cannot be calculated (Unknown ideal weight.).   Medical History: Past Medical History  Diagnosis Date  . Diabetes mellitus without complication ([place])   . Arthritis   . A-fib ([place])   . Breast cancer ([place])     Medications:  Scheduled:   Assessment: Pt is a 82 year old female with a PMH of afib on warfarin. Home dose is 1.5mg  qod alternating with 2mg  qod. INR on admission is subtherapeutic at 1.55. Patient last took 1.5mg  last night (3/25), no dose today yet.  Goal of Therapy:  INR 2-3 Monitor platelets by anticoagulation protocol: Yes   Plan:  INR is subtherapeutic. Will give 3mg  tonight and then resume pt home dose tomorrow night. Recheck INR in the AM.  Ramond Dial, Pharm.D Clinical Pharmacist   06/30/2015,4:58 PM
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
## 2015-07-01 ENCOUNTER — Observation Stay: Payer: Medicare Other

## 2015-07-01 DIAGNOSIS — R079 Chest pain, unspecified: Secondary | ICD-10-CM | POA: Diagnosis not present

## 2015-07-01 LAB — GLUCOSE, CAPILLARY
Glucose-Capillary: 138 mg/dL — ABNORMAL HIGH (ref 65–99)
Glucose-Capillary: 202 mg/dL — ABNORMAL HIGH (ref 65–99)
Glucose-Capillary: 174 mg/dL — ABNORMAL HIGH (ref 65–99)
Glucose-Capillary: 169 mg/dL — ABNORMAL HIGH (ref 65–99)

## 2015-07-01 LAB — PROTIME-INR
INR: 1.85
Prothrombin Time: 21.3 seconds — ABNORMAL HIGH (ref 11.4–15.0)

## 2015-07-01 MED ORDER — METOPROLOL TARTRATE 50 MG PO TABS
50.0000 mg | ORAL_TABLET | Freq: Every day | ORAL | Status: DC
  Administered 2015-07-01 – 2015-07-02 (×2): 50 mg via ORAL
  Filled 2015-07-01 (×2): qty 1

## 2015-07-01 MED ORDER — DIGOXIN 125 MCG PO TABS
0.1250 mg | ORAL_TABLET | Freq: Every day | ORAL | Status: DC
  Administered 2015-07-01 – 2015-07-02 (×2): 0.125 mg via ORAL
  Filled 2015-07-01 (×2): qty 1

## 2015-07-01 MED ORDER — WARFARIN SODIUM 2 MG PO TABS
2.0000 mg | ORAL_TABLET | Freq: Once | ORAL | Status: AC
  Administered 2015-07-01: 2 mg via ORAL
  Filled 2015-07-01: qty 1

## 2015-07-01 MED ORDER — METOPROLOL SUCCINATE ER 25 MG PO TB24
25.0000 mg | ORAL_TABLET | Freq: Every day | ORAL | Status: DC
  Administered 2015-07-01 – 2015-07-02 (×2): 25 mg via ORAL
  Filled 2015-07-01 (×2): qty 1

## 2015-07-01 NOTE — Consult Note (Signed)
ANTICOAGULATION CONSULT NOTE - Initial Consult  Pharmacy Consult for warfarin Indication: atrial fibrillation  Patient Measurements: Height: 5\' 3"  (160 cm) Weight: 111 lb 6.4 oz (50.531 kg) IBW/kg (Calculated) : 52.4  Vital Signs: Temp: 98.5 F (36.9 C) (03/27 0756) Temp Source: Oral (03/27 0756) BP: 121/71 mmHg (03/27 0756) Pulse Rate: 99 (03/27 0756)  Labs:  Recent Labs  06/30/15 1047 06/30/15 1847 07/01/15 0433  HGB 12.6  --   --   HCT 37.3  --   --   PLT 247  --   --   APTT 34  --   --   LABPROT 18.6*  --  21.3*  INR 1.55  --  1.85  CREATININE 0.69  --   --   TROPONINI <0.03 <0.03  --    Estimated Creatinine Clearance: 38 mL/min (by C-G formula based on Cr of 0.69).  Medical History: Past Medical History  Diagnosis Date  . Diabetes mellitus without complication ([place])   . Arthritis   . A-fib ([place])   . Breast cancer ([place])   . Presence of permanent cardiac pacemaker   . A-fib ([place])   . CHF (congestive heart failure) ([place])   . Mitral valve regurgitation   . Mitral valve prolapse   . Hyperlipidemia    Assessment: Pt is a 82 year old female with a PMH of afib on warfarin. Home dose is 1.5mg  qod alternating with 2mg  qod. Patient last took 1.5mg  on 3/25.  INR remains subtherapeutic at 1.85 but has increased from 1.55 yesterday.   Dosing history Date INR Dose 3/26 1.55 3 mg   3/27 1.85 2 mg   Goal of Therapy:  INR 2-3 Monitor platelets by anticoagulation protocol: Yes   Plan:  Will resume home dose and order warfarin 2 mg dose for tonight given increase in INR.  Theodis Shove, Pharm.D Clinical Pharmacist 07/01/2015,8:50 AM
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
## 2015-07-01 NOTE — Progress Notes (Signed)
Spoke with Marya Amsler from [employer], Network engineer will call back with a time for fitting the pt with a CAM walker.
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
## 2015-07-02 DIAGNOSIS — T148XXA Other injury of unspecified body region, initial encounter: Secondary | ICD-10-CM | POA: Insufficient documentation

## 2015-07-02 DIAGNOSIS — R52 Pain, unspecified: Secondary | ICD-10-CM

## 2015-07-02 DIAGNOSIS — T148 Other injury of unspecified body region: Secondary | ICD-10-CM | POA: Diagnosis not present

## 2015-07-02 DIAGNOSIS — R079 Chest pain, unspecified: Secondary | ICD-10-CM | POA: Diagnosis not present

## 2015-07-02 LAB — BASIC METABOLIC PANEL
Anion gap: 5 (ref 5–15)
BUN: 14 mg/dL (ref 6–20)
CO2: 28 mmol/L (ref 22–32)
Calcium: 9.4 mg/dL (ref 8.9–10.3)
Chloride: 102 mmol/L (ref 101–111)
Creatinine, Ser: 0.7 mg/dL (ref 0.44–1.00)
GFR calc Af Amer: >60 mL/min (ref >60)
GFR calc non Af Amer: >60 mL/min (ref >60)
Glucose, Bld: 149 mg/dL — ABNORMAL HIGH (ref 65–99)
Potassium: 4.4 mmol/L (ref 3.5–5.1)
Sodium: 135 mmol/L (ref 135–145)

## 2015-07-02 LAB — CBC
HCT: 32 % — ABNORMAL LOW (ref 35.0–47.0)
Hemoglobin: 10.9 g/dL — ABNORMAL LOW (ref 12.0–16.0)
MCH: 29.6 pg (ref 26.0–34.0)
MCHC: 33.9 g/dL (ref 32.0–36.0)
MCV: 87.4 fL (ref 80.0–100.0)
Platelets: 201 10*3/uL (ref 150–440)
RBC: 3.67 MIL/uL — ABNORMAL LOW (ref 3.80–5.20)
RDW: 14.3 % (ref 11.5–14.5)
WBC: 8.1 10*3/uL (ref 3.6–11.0)

## 2015-07-02 LAB — PROTIME-INR
INR: 2.45
Prothrombin Time: 26.3 seconds — ABNORMAL HIGH (ref 11.4–15.0)

## 2015-07-02 LAB — GLUCOSE, CAPILLARY
Glucose-Capillary: 145 mg/dL — ABNORMAL HIGH (ref 65–99)
Glucose-Capillary: 202 mg/dL — ABNORMAL HIGH (ref 65–99)
Glucose-Capillary: 172 mg/dL — ABNORMAL HIGH (ref 65–99)
Glucose-Capillary: 185 mg/dL — ABNORMAL HIGH (ref 65–99)

## 2015-07-02 MED ORDER — KETOROLAC TROMETHAMINE 15 MG/ML IJ SOLN
7.5000 mg | Freq: Four times a day (QID) | INTRAMUSCULAR | Status: DC | PRN
  Administered 2015-07-02 – 2015-07-03 (×2): 7.5 mg via INTRAVENOUS
  Filled 2015-07-02 (×2): qty 1
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
## 2015-07-02 NOTE — Progress Notes (Signed)
Visit with patient again this evening. Patient reports continued to have pain but feeling somewhat better this evening than last night. Mostly concerned about financial implications of current admission status.  On exam. Left lower extremities exam closely with significant improvement in swelling from previous examination. No evidence of erythema or infection to left lower extremity hematoma.  No current plans for surgical intervention for this. Remainder of injuries requiring treatment per orthopedics and primary care. Surgery will continue to follow for now in case the hematoma orders have infected.  Clayburn Pert, MD FACS General Surgeon [facility]
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
## 2015-07-02 NOTE — Progress Notes (Signed)
Dr. Adonis Huguenin came and examined pt.

## 2015-07-02 NOTE — Clinical Social Work Note (Signed)
Clinical Social Work Assessment  Patient Details  Name: Elizabeth Novak MRN: [id number] Date of Birth: 1924/02/12  Date of referral:  07/02/15               Reason for consult:  Facility Placement, Other (Comment Required) (Patient is under Medicare Observation )                Permission sought to share information with:    Permission granted to share information::     Name::        Agency::     Relationship::     Contact Information:     Housing/Transportation Living arrangements for the past 2 months:  Single Family Home Source of Information:  Patient, Power of Attorney Patient Interpreter Needed:  None Criminal Activity/Legal Involvement Pertinent to Current Situation/Hospitalization:  No - Comment as needed Significant Relationships:  Other Family Members, Adult Children Lives with:  Self Do you feel safe going back to the place where you live?  Yes Need for family participation in patient care:  Yes (Comment)  Care giving concerns:  Patient lives alone in [place].    Office manager / plan:  Visual merchandiser (CSW) received verbal consult from PT that recommendation is for SNF. CSW reviewed case with RN Case Manager who reported that patient will likely remain in observation status. CSW met with patient alone at bedside. Patient was alert and oriented and sitting up in the chair. CSW introduced self and explained role of CSW department. Patient reported that she lives alone in [place] and has a son in [place] and several nieces and nephews in the area. CSW explained to patient that she is under Medicare Observation status, which means Medicare will not pay for SNF. CSW explained private pay option for SNF. Per patient she cannot afford to pay for SNF out of pocket. CSW explained that Medicare would pay for home health PT. Patient reported that she would go home and get her son to stay with her or hire someone to stay with her. Patient gave CSW permission to call  her niece Jacqulyn Bath, who is HPOA.   CSW contacted patient's niece Doris Cheadle and made her aware of above. Per niece patient's son lives in [place] however he can come stay with her if needed. Niece also stated that patient can line up other people to stay with her if she needs to. Niece reported that she would like for patient to go to SNF but understands patient does not have a payer for SNF and is agreeable for her to return home. CSW explained that if patient needs EMS for transport home that can be arranged and patient will have a co-pay. RN Case Manager is aware of above.     Employment status:  Disabled (Comment on whether or not currently receiving Disability), Retired Health and safety inspector:  Medicare (Medicare Observation) PT Recommendations:  Skilled Nursing Facility Information / Referral to community resources:  Other (Comment Required), Skilled Nursing Facility (Patient is under Medicare observation and has not payor for SNF. )  Patient/Family's Response to care:  Patient understands that Medicare will not pay for SNF and has agreed to return home with home health.   Patient/Family's Understanding of and Emotional Response to Diagnosis, Current Treatment, and Prognosis:  Patient and niece were pleasant and thanked CSW for assistance.   Emotional Assessment Appearance:  Appears stated age Attitude/Demeanor/Rapport:    Affect (typically observed):  Accepting, Adaptable, Pleasant Orientation:  Oriented to  Self, Oriented to Place, Oriented to  Time, Oriented to Situation Alcohol / Substance use:  Not Applicable Psych involvement (Current and /or in the community):  No (Comment)  Discharge Needs  Concerns to be addressed:  Discharge Planning Concerns Readmission within the last 30 days:  No Current discharge risk:  Dependent with Mobility Barriers to Discharge:  Continued Medical Work up   Haig Prophet, LCSW 07/02/2015, 1:35 PM
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
## 2015-07-03 ENCOUNTER — Observation Stay (HOSPITAL_BASED_OUTPATIENT_CLINIC_OR_DEPARTMENT_OTHER)
Admit: 2015-07-03 | Discharge: 2015-07-03 | Disposition: A | Discharge status: Home / Self Care | Payer: Medicare Other | Attending: Internal Medicine | Admitting: Internal Medicine

## 2015-07-03 DIAGNOSIS — R079 Chest pain, unspecified: Secondary | ICD-10-CM | POA: Diagnosis not present

## 2015-07-03 DIAGNOSIS — S8012XA Contusion of left lower leg, initial encounter: Secondary | ICD-10-CM | POA: Insufficient documentation

## 2015-07-03 DIAGNOSIS — S8012XD Contusion of left lower leg, subsequent encounter: Secondary | ICD-10-CM

## 2015-07-03 LAB — ECHOCARDIOGRAM COMPLETE
Height: 63 in
Weight: 1782.4 oz

## 2015-07-03 LAB — CBC
HCT: 27.6 % — ABNORMAL LOW (ref 35.0–47.0)
Hemoglobin: 9.4 g/dL — ABNORMAL LOW (ref 12.0–16.0)
MCH: 28.8 pg (ref 26.0–34.0)
MCHC: 33.8 g/dL (ref 32.0–36.0)
MCV: 85.2 fL (ref 80.0–100.0)
Platelets: 183 10*3/uL (ref 150–440)
RBC: 3.24 MIL/uL — ABNORMAL LOW (ref 3.80–5.20)
RDW: 13.8 % (ref 11.5–14.5)
WBC: 8.5 10*3/uL (ref 3.6–11.0)

## 2015-07-03 LAB — GLUCOSE, CAPILLARY
Glucose-Capillary: 166 mg/dL — ABNORMAL HIGH (ref 65–99)
Glucose-Capillary: 153 mg/dL — ABNORMAL HIGH (ref 65–99)
Glucose-Capillary: 193 mg/dL — ABNORMAL HIGH (ref 65–99)

## 2015-07-03 LAB — PROTIME-INR
INR: 2.59
Prothrombin Time: 27.4 seconds — ABNORMAL HIGH (ref 11.4–15.0)

## 2015-07-03 MED ORDER — WARFARIN SODIUM 3 MG PO TABS
1.5000 mg | ORAL_TABLET | Freq: Once | ORAL | Status: DC
Start: 2015-07-03 — End: 2015-07-03

## 2015-07-03 MED ORDER — WARFARIN SODIUM 3 MG PO TABS: 1.5000 mg | ORAL_TABLET | Freq: Once | ORAL | Status: DC

## 2015-07-03 MED ORDER — ACETAMINOPHEN 325 MG PO TABS: 650.0000 mg | ORAL_TABLET | Freq: Four times a day (QID) | ORAL | Status: DC | PRN

## 2015-07-03 MED ORDER — METOPROLOL SUCCINATE ER 50 MG PO TB24: 75.0000 mg | ORAL_TABLET | Freq: Every day | ORAL | Status: DC

## 2015-07-03 MED ORDER — HYDROCODONE-ACETAMINOPHEN 5-325 MG PO TABS: 1.0000 | ORAL_TABLET | Freq: Four times a day (QID) | ORAL | Status: DC | PRN

## 2015-07-03 MED ORDER — HYDROCODONE-ACETAMINOPHEN 5-325 MG PO TABS: 1.0000 | ORAL_TABLET | ORAL | Status: DC | PRN
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
## 2015-07-03 NOTE — Discharge Instructions (Signed)
Activity-partial weightbearing on the right leg, as recommended by Ortho Cardiac diet Follow-up with primary care physician in 3-5 days Follow-up with surgery-Dr. Adonis Huguenin in a week Follow-up with Ortho Dr. Marry Guan in a week Home health to get repeat PT/INR tomorrow, further Coumadin dosing by primary care physician based on PT/INR results

## 2015-07-03 NOTE — Care Management (Signed)
Home health orders received and faxed to Santiago Glad with [facility]. No further RNCM needs. Case closed.

## 2015-07-03 NOTE — Consult Note (Signed)
ANTICOAGULATION CONSULT NOTE - Initial Consult  Pharmacy Consult for warfarin Indication: atrial fibrillation  Patient Measurements: Height: 5\' 3"  (160 cm) Weight: 111 lb 6.4 oz (50.531 kg) IBW/kg (Calculated) : 52.4  Vital Signs: Temp: 97.9 F (36.6 C) (03/29 0747) Temp Source: Oral (03/29 0747) BP: 147/62 mmHg (03/29 0747) Pulse Rate: 78 (03/29 0947)  Labs:  Recent Labs  06/30/15 1847 07/01/15 0433 07/02/15 0452 07/03/15 0510  HGB  --   --  10.9* 9.4*  HCT  --   --  32.0* 27.6*  PLT  --   --  201 183  LABPROT  --  21.3* 26.3* 27.4*  INR  --  1.85 2.45 2.59  CREATININE  --   --  0.70  --   TROPONINI <0.03  --   --   --    Estimated Creatinine Clearance: 38 mL/min (by C-G formula based on Cr of 0.7).  Medical History: Past Medical History  Diagnosis Date  . Diabetes mellitus without complication ([place])   . Arthritis   . A-fib ([place])   . Breast cancer ([place])   . Presence of permanent cardiac pacemaker   . A-fib ([place])   . CHF (congestive heart failure) ([place])   . Mitral valve regurgitation   . Mitral valve prolapse   . Hyperlipidemia    Assessment: Pt is a 82 year old female with a PMH of afib on warfarin. Home dose is 1.5mg  qod alternating with 2mg  qod. Patient last took 1.5mg  on 3/25.  INR is therapeutic today at 2.59.   Dosing history Date INR Dose 3/26 1.55 3 mg   3/27 1.85 2 mg  3/28 2.45 HOLD  3/29 2.59 1.5 mg   Goal of Therapy:  INR 2-3 Monitor platelets by anticoagulation protocol: Yes   Plan:  INR is therapeutic today and has not increased significantly from yesterday.  Will resume home dose and give 1.5 mg of warfarin tonight.   Theodis Shove, Pharm.D Clinical Pharmacist 07/03/2015,12:26 PM

## 2015-07-03 NOTE — Plan of Care (Signed)
Problem: Activity: Goal: Risk for activity intolerance will decrease Outcome: Progressing Pt up to [facility] one assist

## 2015-07-03 NOTE — Progress Notes (Signed)
EMS to take patient home, friend at side, discharge instructions given per MD order by Curlene Labrum.  Rx.slip given for hydrocodone. Patient verbalized understanding of discharge instructions given.  Discharge via stretcher with EMS.

## 2015-07-03 NOTE — Progress Notes (Signed)
LLE dressing changed per MD order.

## 2015-07-03 NOTE — Care Management (Signed)
Met with patient to discuss home health agency list provided yesterday. She would like to use [facility]. I have text Dr. Gilberto Better to update on patient need. HHRN for PT/INR labs along with wound assessment/documentation, PT, Education officer, museum, and nursing assistant to help with ADLs. No current orders are in [facility] but I will fax them to Santiago Glad with [facility] when they are available. EMS packet compete. Patient is arranging private duty for today- someone named Judson Roch.
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
## 2015-07-03 NOTE — Progress Notes (Signed)
*  PRELIMINARY RESULTS* Echocardiogram 2D Echocardiogram has been performed.  Elizabeth Novak 07/03/2015, 11:45 AM
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
## 2015-07-10 ENCOUNTER — Telehealth: Payer: Self-pay
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
## 2015-07-11 ENCOUNTER — Encounter: Payer: Self-pay | Admitting: General Surgery

## 2015-07-11 ENCOUNTER — Ambulatory Visit (INDEPENDENT_AMBULATORY_CARE_PROVIDER_SITE_OTHER): Payer: Medicare Other | Admitting: General Surgery

## 2015-07-11 VITALS — BP 122/65 | HR 86 | Temp 97.4°F | Ht 63.0 in | Wt 111.0 lb

## 2015-07-11 DIAGNOSIS — R0789 Other chest pain: Secondary | ICD-10-CM

## 2015-07-11 DIAGNOSIS — S8012XD Contusion of left lower leg, subsequent encounter: Secondary | ICD-10-CM | POA: Diagnosis not present

## 2015-07-11 MED ORDER — GABAPENTIN 300 MG PO CAPS: 300.0000 mg | ORAL_CAPSULE | Freq: Two times a day (BID) | ORAL | Status: DC
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
## 2015-07-11 NOTE — Patient Instructions (Signed)
You may use Ice or Heat if this helps your pain.  Apply lotion to your leg to keep the skin in this area from drying out.  We have sent your prescription for your medication to the pharmacy.

## 2015-07-17 ENCOUNTER — Telehealth: Payer: Self-pay

## 2015-07-17 NOTE — Telephone Encounter (Signed)
I was asked by Dr. Adonis Huguenin to call and get an update on the patient. I tried both numbers listed and was unable to get a voicemail or anyone to answer phone. Will be glad to speak with patient or family if she calls in.

## 2015-07-24 ENCOUNTER — Ambulatory Visit (INDEPENDENT_AMBULATORY_CARE_PROVIDER_SITE_OTHER): Payer: Medicare Other | Admitting: General Surgery

## 2015-07-24 ENCOUNTER — Encounter: Payer: Self-pay | Admitting: General Surgery

## 2015-07-24 DIAGNOSIS — S8012XA Contusion of left lower leg, initial encounter: Secondary | ICD-10-CM
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
## 2015-07-24 NOTE — Patient Instructions (Addendum)
Keep leg elevated when not walking around. Keep leg wrapped. Restart Gabapentin at night. Return in one week with the nurse and two weeks with the doctor.

## 2015-07-25 ENCOUNTER — Telehealth: Payer: Self-pay | Admitting: *Deleted

## 2015-07-25 NOTE — Telephone Encounter (Signed)
-----   Message from Robert Bellow, MD sent at 07/25/2015  8:51 AM EDT ----- Please contact patient and see how she tolerated Unna boot.  If not well, we may do I&D tomorrow.

## 2015-07-25 NOTE — Telephone Encounter (Signed)
She states she took a gabapentin and 1/2 pain pill last night and finally slept 6 hours. She states yesterday was worse but today not so bad. She states it feels better when she is off of it and it is not dangling. She is willing to keep the wrap on and wants to try to avoid surgery for now.

## 2015-07-26 DIAGNOSIS — S7002XA Contusion of left hip, initial encounter: Secondary | ICD-10-CM | POA: Insufficient documentation

## 2015-07-30 ENCOUNTER — Ambulatory Visit (INDEPENDENT_AMBULATORY_CARE_PROVIDER_SITE_OTHER): Payer: Medicare Other | Admitting: *Deleted

## 2015-07-30 DIAGNOSIS — S8012XD Contusion of left lower leg, subsequent encounter: Secondary | ICD-10-CM

## 2015-07-30 NOTE — Progress Notes (Signed)
The patient came in today for unna boot dressing change.  Left leg was washed with soap and water.  Unna boot, duoderm, kerlix and coban applied.  Edema improving.The area of concern is improving. She states the home health nurse had to change the unna boot due to bleeding.   Follow up as scheduled with MD next week.

## 2015-07-30 NOTE — Patient Instructions (Signed)
The patient is aware to call back for any questions or concerns.  

## 2015-08-08 ENCOUNTER — Encounter: Payer: Self-pay | Admitting: General Surgery

## 2015-08-08 ENCOUNTER — Ambulatory Visit (INDEPENDENT_AMBULATORY_CARE_PROVIDER_SITE_OTHER): Payer: Medicare Other | Admitting: General Surgery

## 2015-08-08 VITALS — BP 132/80 | HR 90 | Resp 16 | Ht 63.0 in | Wt 100.0 lb

## 2015-08-08 DIAGNOSIS — S8012XD Contusion of left lower leg, subsequent encounter: Secondary | ICD-10-CM | POA: Diagnosis not present

## 2015-08-08 NOTE — Patient Instructions (Signed)
The patient is aware to call back for any questions or concerns.  

## 2015-08-08 NOTE — Progress Notes (Signed)
Patient ID: Elizabeth Novak, female   DOB: 11-03-1923, 82 y.o.   MRN: [id number]  Chief Complaint  Patient presents with  . Follow-up    HPI Elizabeth Novak is a 82 y.o. female.  Here today for follow up left leg hematoma. She staes she is still having pain in the area.    HPI  Past Medical History  Diagnosis Date  . Diabetes mellitus without complication (HCC)   . Arthritis   . A-fib (HCC)   . Presence of permanent cardiac pacemaker   . A-fib (HCC)   . CHF (congestive heart failure) (HCC)   . Mitral valve regurgitation   . Mitral valve prolapse   . Hyperlipidemia   . Breast cancer (HCC) 1978    Past Surgical History  Procedure Laterality Date  . Pacemaker insertion  08/11/12  . Mastectomy Left   . Mastectomy Right   . Knee arthroscopy Right   . Abdominal hysterectomy    . Esophageal dilation    . Temporal artery biopsy / ligation    . Cardiac catheterization      Family History  Problem Relation Age of Onset  . Hypertension Mother     Social History Social History  Substance Use Topics  . Smoking status: Never Smoker   . Smokeless tobacco: Never Used  . Alcohol Use: No    Allergies  Allergen Reactions  . Codeine Nausea Only  . Disopyramide     Other reaction(s): Unknown  . Ibuprofen Diarrhea  . Iodine     blisters  . Norpace [Disopyramide Phosphate]   . Quinidine     Other reaction(s): Unknown  . Terfenadine     Other reaction(s): Unknown  . Topiramate     Other reaction(s): Other (See Comments) Hair loss  . Verapamil     Other reaction(s): Unknown    Current Outpatient Prescriptions  Medication Sig Dispense Refill  . acetaminophen (TYLENOL) 325 MG tablet Take 2 tablets (650 mg total) by mouth every 6 (six) hours as needed for mild pain (or Fever >/= 101).    . Biotin 1 MG CAPS Take 1 mg by mouth daily.    . digoxin (LANOXIN) 0.125 MG tablet Take 125 mcg by mouth at bedtime.     . ferrous sulfate 325 (65 FE) MG tablet Take 325 mg by  mouth daily with breakfast.    . fluticasone (FLONASE) 50 MCG/ACT nasal spray Place 2 sprays into both nostrils daily.    Marland Kitchen gabapentin (NEURONTIN) 100 MG capsule Take 100 mg by mouth at bedtime.    Marland Kitchen glimepiride (AMARYL) 2 MG tablet Take 2 mg by mouth 3 (three) times daily.    . magnesium oxide (MAG-OX) 400 MG tablet Take 400 mg by mouth 2 (two) times daily. Reported on 06/30/2015    . metFORMIN (GLUCOPHAGE) 500 MG tablet Take 500 mg by mouth 2 (two) times daily with a meal.    . methocarbamol (ROBAXIN) 500 MG tablet Take 500 mg by mouth 4 (four) times daily.    . metoprolol succinate (TOPROL-XL) 50 MG 24 hr tablet Take 75 mg by mouth daily.    . Multiple Vitamins-Minerals (PRESERVISION AREDS 2 PO) Take 1 tablet by mouth 2 (two) times daily.    Marland Kitchen omeprazole (PRILOSEC) 20 MG capsule Take 20 mg by mouth 2 (two) times daily as needed. For heartburn/indigestion.    Marland Kitchen oxycodone-acetaminophen (LYNOX) 5-300 MG tablet Take 1 tablet by mouth every 4 (four) hours as needed for pain.    Marland Kitchen  potassium chloride (K-DUR,KLOR-CON) 10 MEQ tablet Take 10 mEq by mouth 4 (four) times daily.    Marland Kitchen senna-docusate (SENOKOT-S) 8.6-50 MG tablet Take 1 tablet by mouth 2 (two) times daily.    Marland Kitchen torsemide (DEMADEX) 20 MG tablet Take 20 mg by mouth 3 (three) times daily.     Marland Kitchen warfarin (COUMADIN) 1 MG tablet Take 1 mg by mouth daily.     No current facility-administered medications for this visit.    Review of Systems Review of Systems  Constitutional: Negative.   Respiratory: Negative.   Cardiovascular: Negative.     Blood pressure 132/80, pulse 90, resp. rate 16, height 5\' 3"  (1.6 m), weight 100 lb (45.36 kg).  Physical Exam Physical Exam  Constitutional: She is oriented to person, place, and time. She appears well-developed and well-nourished.  Musculoskeletal:       Legs: Neurological: She is alert and oriented to person, place, and time.  Skin: Skin is warm and dry.  Psychiatric: Her behavior is normal.        Assessment    Resolving hematoma left lower extremity.    Plan    The patient is still having some discomfort, but in general appears to be doing better. Area is decreasing in diameter in volume nicely.  For continuity of examinations a repeat exam with the office nurse in 1 week in physician exam in 2 weeks is been requested.     Follow up MD 2 weeks. Unna boot in one week.  PCP:  Bethann Punches This information has been scribed by Dorathy Daft RN, BSN,BC.   Elizabeth Novak 08/09/2015, 6:36 AM

## 2015-08-13 ENCOUNTER — Ambulatory Visit (INDEPENDENT_AMBULATORY_CARE_PROVIDER_SITE_OTHER): Payer: Medicare Other | Admitting: *Deleted

## 2015-08-13 DIAGNOSIS — S8012XD Contusion of left lower leg, subsequent encounter: Secondary | ICD-10-CM

## 2015-08-13 NOTE — Patient Instructions (Addendum)
May take one half or one hydrocodone for pain instead of percocet.  Stop the glimepiride at night continue Finger stick blood sugars

## 2015-08-13 NOTE — Progress Notes (Addendum)
The patient came in today for unna boot dressing change.  Right leg were washed with soap and water.  Wound is 4.5 x 9 with circumference of 25. Unna boot duoderm, kerlix and coban applied.  Edema improving.The area of concern is improving. Dr Bary Castilla present to evaluate wound as well and is pleased with healing. He advised that she may adjust percocet and hydrocodone according to pain. She states her blood sugar has been dropping mostly in the mornings. Day time BS is in the 200's. Per Dr Bary Castilla she will hold the glimepiride at night.Follow up as scheduled.

## 2015-08-15 ENCOUNTER — Ambulatory Visit: Payer: Medicare Other

## 2015-08-20 ENCOUNTER — Ambulatory Visit: Payer: Self-pay

## 2015-08-20 ENCOUNTER — Encounter
Admission: RE | Admit: 2015-08-20 | Discharge: 2015-08-20 | Disposition: A | Discharge status: Home / Self Care | Payer: Medicare Other | Source: Ambulatory Visit | Attending: General Surgery | Admitting: General Surgery

## 2015-08-20 ENCOUNTER — Ambulatory Visit (INDEPENDENT_AMBULATORY_CARE_PROVIDER_SITE_OTHER): Payer: Medicare Other | Admitting: General Surgery

## 2015-08-20 ENCOUNTER — Encounter: Payer: Self-pay | Admitting: General Surgery

## 2015-08-20 VITALS — BP 116/70 | HR 89 | Resp 14 | Ht 63.0 in | Wt 100.0 lb

## 2015-08-20 DIAGNOSIS — I2510 Atherosclerotic heart disease of native coronary artery without angina pectoris: Secondary | ICD-10-CM | POA: Diagnosis not present

## 2015-08-20 DIAGNOSIS — S8012XD Contusion of left lower leg, subsequent encounter: Secondary | ICD-10-CM

## 2015-08-20 DIAGNOSIS — Z885 Allergy status to narcotic agent status: Secondary | ICD-10-CM | POA: Diagnosis not present

## 2015-08-20 DIAGNOSIS — L97901 Non-pressure chronic ulcer of unspecified part of unspecified lower leg limited to breakdown of skin: Secondary | ICD-10-CM | POA: Diagnosis not present

## 2015-08-20 DIAGNOSIS — X58XXXA Exposure to other specified factors, initial encounter: Secondary | ICD-10-CM | POA: Diagnosis not present

## 2015-08-20 DIAGNOSIS — Y929 Unspecified place or not applicable: Secondary | ICD-10-CM | POA: Diagnosis not present

## 2015-08-20 DIAGNOSIS — E1140 Type 2 diabetes mellitus with diabetic neuropathy, unspecified: Secondary | ICD-10-CM | POA: Diagnosis not present

## 2015-08-20 DIAGNOSIS — I4891 Unspecified atrial fibrillation: Secondary | ICD-10-CM | POA: Diagnosis not present

## 2015-08-20 DIAGNOSIS — I998 Other disorder of circulatory system: Secondary | ICD-10-CM | POA: Diagnosis not present

## 2015-08-20 DIAGNOSIS — Z886 Allergy status to analgesic agent status: Secondary | ICD-10-CM | POA: Diagnosis not present

## 2015-08-20 DIAGNOSIS — S8012XA Contusion of left lower leg, initial encounter: Secondary | ICD-10-CM | POA: Diagnosis not present

## 2015-08-20 DIAGNOSIS — I341 Nonrheumatic mitral (valve) prolapse: Secondary | ICD-10-CM | POA: Diagnosis not present

## 2015-08-20 DIAGNOSIS — K219 Gastro-esophageal reflux disease without esophagitis: Secondary | ICD-10-CM | POA: Diagnosis not present

## 2015-08-20 DIAGNOSIS — Z853 Personal history of malignant neoplasm of breast: Secondary | ICD-10-CM | POA: Diagnosis not present

## 2015-08-20 DIAGNOSIS — Z950 Presence of cardiac pacemaker: Secondary | ICD-10-CM | POA: Diagnosis not present

## 2015-08-20 DIAGNOSIS — Y939 Activity, unspecified: Secondary | ICD-10-CM | POA: Diagnosis not present

## 2015-08-20 DIAGNOSIS — E785 Hyperlipidemia, unspecified: Secondary | ICD-10-CM | POA: Diagnosis not present

## 2015-08-20 DIAGNOSIS — Z888 Allergy status to other drugs, medicaments and biological substances status: Secondary | ICD-10-CM | POA: Diagnosis not present

## 2015-08-20 HISTORY — DX: Diverticulitis of intestine, part unspecified, without perforation or abscess without bleeding: K57.92

## 2015-08-20 HISTORY — DX: Cardiac arrhythmia, unspecified: I49.9

## 2015-08-20 HISTORY — DX: Gastro-esophageal reflux disease without esophagitis: K21.9

## 2015-08-20 HISTORY — DX: Unspecified macular degeneration: H35.30

## 2015-08-20 HISTORY — DX: Anemia, unspecified: D64.9

## 2015-08-20 LAB — BASIC METABOLIC PANEL
Anion gap: 10 (ref 5–15)
BUN: 15 mg/dL (ref 6–20)
CO2: 30 mmol/L (ref 22–32)
Calcium: 9.8 mg/dL (ref 8.9–10.3)
Chloride: 94 mmol/L — ABNORMAL LOW (ref 101–111)
Creatinine, Ser: 0.74 mg/dL (ref 0.44–1.00)
GFR calc Af Amer: >60 mL/min (ref >60)
GFR calc non Af Amer: >60 mL/min (ref >60)
Glucose, Bld: 242 mg/dL — ABNORMAL HIGH (ref 65–99)
Potassium: 3.2 mmol/L — ABNORMAL LOW (ref 3.5–5.1)
Sodium: 134 mmol/L — ABNORMAL LOW (ref 135–145)

## 2015-08-20 LAB — DIFFERENTIAL
Basophils Absolute: 0 10*3/uL (ref 0–0.1)
Basophils Relative: 1 %
Eosinophils Absolute: 0.1 10*3/uL (ref 0–0.7)
Eosinophils Relative: 2 %
Lymphocytes Relative: 22 %
Lymphs Abs: 1.3 10*3/uL (ref 1.0–3.6)
Monocytes Absolute: 0.6 10*3/uL (ref 0.2–0.9)
Monocytes Relative: 10 %
Neutro Abs: 4 10*3/uL (ref 1.4–6.5)
Neutrophils Relative %: 65 %

## 2015-08-20 LAB — CBC
HCT: 37.6 % (ref 35.0–47.0)
Hemoglobin: 12.2 g/dL (ref 12.0–16.0)
MCH: 27.5 pg (ref 26.0–34.0)
MCHC: 32.5 g/dL (ref 32.0–36.0)
MCV: 84.5 fL (ref 80.0–100.0)
Platelets: 292 10*3/uL (ref 150–440)
RBC: 4.45 MIL/uL (ref 3.80–5.20)
RDW: 14 % (ref 11.5–14.5)
WBC: 6 10*3/uL (ref 3.6–11.0)

## 2015-08-20 LAB — PROTIME-INR
INR: 1.03
Prothrombin Time: 13.7 seconds (ref 11.4–15.0)

## 2015-08-20 NOTE — Patient Instructions (Signed)
Patient is scheduled for surgery at [facility] for 08/21/15. She will pre admit at the hospital today. Patient is aware of date and instructions.

## 2015-08-20 NOTE — Progress Notes (Signed)
Patient ID: Elizabeth Novak, female   DOB: 02/28/1924, 82 y.o.   MRN: [id number]  Chief Complaint  Patient presents with  . Follow-up    hematoma     HPI Elizabeth Novak is a 82 y.o. female here today for her follow up hematoma left leg.Patient states she got rewrapped on Thursday. At the time of her last visit on May 5, she had had several episodes of hypoglycemia. She had been instructed to discontinue her gliperimide at nighttime, and the following day she was seen by her primary care physician, Bethann Punches, M.D. at which time he discontinued it completely. She brought a list of her blood sugars with her and she has had no further episodes of nocturnal hypoglycemia. Lowest blood sugar 62 on the morning of 59 and since then nothing below 88. Evening blood sugars have been running a little higher but generally between 180 and 2:30. Great niece was present, [place] HPI  Past Medical History  Diagnosis Date  . Diabetes mellitus without complication (HCC)   . Arthritis   . A-fib (HCC)   . Presence of permanent cardiac pacemaker   . A-fib (HCC)   . CHF (congestive heart failure) (HCC)   . Mitral valve regurgitation   . Mitral valve prolapse   . Hyperlipidemia   . Breast cancer (HCC) 1978    Past Surgical History  Procedure Laterality Date  . Pacemaker insertion  08/11/12  . Mastectomy Left   . Mastectomy Right   . Knee arthroscopy Right   . Abdominal hysterectomy    . Esophageal dilation    . Temporal artery biopsy / ligation    . Cardiac catheterization      Family History  Problem Relation Age of Onset  . Hypertension Mother     Social History Social History  Substance Use Topics  . Smoking status: Never Smoker   . Smokeless tobacco: Never Used  . Alcohol Use: No    Allergies  Allergen Reactions  . Codeine Nausea Only  . Disopyramide     Other reaction(s): Unknown  . Ibuprofen Diarrhea  . Iodine     blisters  . Norpace [Disopyramide Phosphate]   .  Quinidine     Other reaction(s): Unknown  . Terfenadine     Other reaction(s): Unknown  . Topiramate     Other reaction(s): Other (See Comments) Hair loss  . Verapamil     Other reaction(s): Unknown    Current Outpatient Prescriptions  Medication Sig Dispense Refill  . acetaminophen (TYLENOL) 325 MG tablet Take 2 tablets (650 mg total) by mouth every 6 (six) hours as needed for mild pain (or Fever >/= 101).    . Biotin 1 MG CAPS Take 1 mg by mouth daily.    . digoxin (LANOXIN) 0.125 MG tablet Take 125 mcg by mouth at bedtime.     . ferrous sulfate 325 (65 FE) MG tablet Take 325 mg by mouth daily with breakfast.    . fluticasone (FLONASE) 50 MCG/ACT nasal spray Place 2 sprays into both nostrils daily.    Marland Kitchen gabapentin (NEURONTIN) 100 MG capsule Take 100 mg by mouth at bedtime.    Marland Kitchen glimepiride (AMARYL) 2 MG tablet Take 2 mg by mouth 3 (three) times daily.    . magnesium oxide (MAG-OX) 400 MG tablet Take 400 mg by mouth 2 (two) times daily. Reported on 06/30/2015    . metFORMIN (GLUCOPHAGE) 500 MG tablet Take 500 mg by mouth 2 (two) times daily  with a meal.    . methocarbamol (ROBAXIN) 500 MG tablet Take 500 mg by mouth 4 (four) times daily.    . metoprolol succinate (TOPROL-XL) 50 MG 24 hr tablet Take 75 mg by mouth daily.    . Multiple Vitamins-Minerals (PRESERVISION AREDS 2 PO) Take 1 tablet by mouth 2 (two) times daily.    Marland Kitchen omeprazole (PRILOSEC) 20 MG capsule Take 20 mg by mouth 2 (two) times daily as needed. For heartburn/indigestion.    Marland Kitchen oxycodone-acetaminophen (LYNOX) 5-300 MG tablet Take 1 tablet by mouth every 4 (four) hours as needed for pain.    . potassium chloride (K-DUR,KLOR-CON) 10 MEQ tablet Take 10 mEq by mouth 4 (four) times daily.    Marland Kitchen senna-docusate (SENOKOT-S) 8.6-50 MG tablet Take 1 tablet by mouth 2 (two) times daily.    Marland Kitchen torsemide (DEMADEX) 20 MG tablet Take 20 mg by mouth 3 (three) times daily.     Marland Kitchen warfarin (COUMADIN) 1 MG tablet Take 1 mg by mouth daily.  Reported on 08/20/2015     No current facility-administered medications for this visit.    Review of Systems Review of Systems  Constitutional: Negative.   Respiratory: Negative.   Cardiovascular: Negative.     Blood pressure 116/70, pulse 89, resp. rate 14, height 5\' 3"  (1.6 m), weight 100 lb (45.36 kg).  Physical Exam Physical Exam  Constitutional: She is oriented to person, place, and time. She appears well-developed and well-nourished.  Cardiovascular: An irregularly irregular rhythm present.  Pulmonary/Chest: Effort normal and breath sounds normal.  Musculoskeletal:       Legs: Neurological: She is alert and oriented to person, place, and time.  Skin: Skin is dry.    Data Reviewed Ultrasound examination showed a hematoma cavity measuring up to 2.6 cm in thickness above the level of the muscle fascia. The hematoma covers about 8 cm in length and width.  Popliteal vein and artery are patent.  Assessment    Chronic, slowly resolving hematoma of the left lower extremity with overlying superficial soft tissue loss.    Plan    At this point I think we can benefit from evacuation of the hematoma and debridement of the superficial soft tissue loss. She is at high risk for anesthesia based on the fact that she and Moses attended high school together, but I think this can be safely accomplished with propofol as it would be a very short procedure. Anesthesia input will be appreciated.    Patient is scheduled for surgery at [facility] for 08/21/15. She will pre admit at the hospital today. Patient is aware of date and instructions.   PCP:  Hyacinth Meeker This information has been scribed by Ples Specter CMA.   Earline Mayotte 08/20/2015, 9:44 AM

## 2015-08-20 NOTE — Pre-Procedure Instructions (Signed)
Cardiac clearance received from Dr.Fath and placed on chart.

## 2015-08-20 NOTE — Pre-Procedure Instructions (Signed)
REQUEST FOR CLEARANCE/EKG AS INSTRUCTED BY DR VANSTAVERN CCALLED AND FAXED TO DR Ubaldo Glassing. SPOKE WITH PATRICIA. ALSO CALLED AND FAXED INFO TO DR Bary Castilla AND SPOKE WITH CAROLYN

## 2015-08-20 NOTE — H&P (Signed)
HPI  Elizabeth Novak is a 82 y.o. female here today for her follow up hematoma left leg.Patient states she got rewrapped on Thursday. At the time of her last visit on May 5, she had had several episodes of hypoglycemia. She had been instructed to discontinue her gliperimide at nighttime, and the following day she was seen by her primary care physician, Emily Filbert, M.D. at which time he discontinued it completely. She brought a list of her blood sugars with her and she has had no further episodes of nocturnal hypoglycemia. Lowest blood sugar 62 on the morning of 59 and since then nothing below 88. Evening blood sugars have been running a little higher but generally between 180 and 2:30.  Great niece was present, Elizabeth Novak  HPI  Past Medical History   Diagnosis  Date   .  Diabetes mellitus without complication ([place])    .  Arthritis    .  A-fib ([place])    .  Presence of permanent cardiac pacemaker    .  A-fib ([place])    .  CHF (congestive heart failure) ([place])    .  Mitral valve regurgitation    .  Mitral valve prolapse    .  Hyperlipidemia    .  Breast cancer ([place])  1978    Past Surgical History   Procedure  Laterality  Date   .  Pacemaker insertion   08/11/12   .  Mastectomy  Left    .  Mastectomy  Right    .  Knee arthroscopy  Right    .  Abdominal hysterectomy     .  Esophageal dilation     .  Temporal artery biopsy / ligation     .  Cardiac catheterization      Family History   Problem  Relation  Age of Onset   .  Hypertension  Mother     Social History  Social History   Substance Use Topics   .  Smoking status:  Never Smoker   .  Smokeless tobacco:  Never Used   .  Alcohol Use:  No    Allergies   Allergen  Reactions   .  Codeine  Nausea Only   .  Disopyramide      Other reaction(s): Unknown   .  Ibuprofen  Diarrhea   .  Iodine      blisters   .  Norpace [Disopyramide Phosphate]    .  Quinidine      Other reaction(s): Unknown   .  Terfenadine      Other  reaction(s): Unknown   .  Topiramate      Other reaction(s): Other (See Comments)  Hair loss   .  Verapamil      Other reaction(s): Unknown    Current Outpatient Prescriptions   Medication  Sig  Dispense  Refill   .  acetaminophen (TYLENOL) 325 MG tablet  Take 2 tablets (650 mg total) by mouth every 6 (six) hours as needed for mild pain (or Fever >/= 101).     .  Biotin 1 MG CAPS  Take 1 mg by mouth daily.     .  digoxin (LANOXIN) 0.125 MG tablet  Take 125 mcg by mouth at bedtime.     .  ferrous sulfate 325 (65 FE) MG tablet  Take 325 mg by mouth daily with breakfast.     .  fluticasone (FLONASE) 50 MCG/ACT nasal spray  Place 2 sprays  into both nostrils daily.     Marland Kitchen  gabapentin (NEURONTIN) 100 MG capsule  Take 100 mg by mouth at bedtime.     Marland Kitchen  glimepiride (AMARYL) 2 MG tablet  Take 2 mg by mouth 3 (three) times daily.     .  magnesium oxide (MAG-OX) 400 MG tablet  Take 400 mg by mouth 2 (two) times daily. Reported on 06/30/2015     .  metFORMIN (GLUCOPHAGE) 500 MG tablet  Take 500 mg by mouth 2 (two) times daily with a meal.     .  methocarbamol (ROBAXIN) 500 MG tablet  Take 500 mg by mouth 4 (four) times daily.     .  metoprolol succinate (TOPROL-XL) 50 MG 24 hr tablet  Take 75 mg by mouth daily.     .  Multiple Vitamins-Minerals (PRESERVISION AREDS 2 PO)  Take 1 tablet by mouth 2 (two) times daily.     Marland Kitchen  omeprazole (PRILOSEC) 20 MG capsule  Take 20 mg by mouth 2 (two) times daily as needed. For heartburn/indigestion.     Marland Kitchen  oxycodone-acetaminophen (LYNOX) 5-300 MG tablet  Take 1 tablet by mouth every 4 (four) hours as needed for pain.     .  potassium chloride (K-DUR,KLOR-CON) 10 MEQ tablet  Take 10 mEq by mouth 4 (four) times daily.     Marland Kitchen  senna-docusate (SENOKOT-S) 8.6-50 MG tablet  Take 1 tablet by mouth 2 (two) times daily.     Marland Kitchen  torsemide (DEMADEX) 20 MG tablet  Take 20 mg by mouth 3 (three) times daily.     Marland Kitchen  warfarin (COUMADIN) 1 MG tablet  Take 1 mg by mouth daily. Reported on  08/20/2015      No current facility-administered medications for this visit.    Review of Systems  Review of Systems  Constitutional: Negative.  Respiratory: Negative.  Cardiovascular: Negative.   Blood pressure 116/70, pulse 89, resp. rate 14, height 5\' 3"  (1.6 m), weight 100 lb (45.36 kg).  Physical Exam  Physical Exam  Constitutional: She is oriented to person, place, and time. She appears well-developed and well-nourished.  Cardiovascular: An irregularly irregular rhythm present.  Pulmonary/Chest: Effort normal and breath sounds normal.  Musculoskeletal:  Legs: Neurological: She is alert and oriented to person, place, and time.  Skin: Skin is dry.   Data Reviewed  Ultrasound examination showed a hematoma cavity measuring up to 2.6 cm in thickness above the level of the muscle fascia. The hematoma covers about 8 cm in length and width.  Popliteal vein and artery are patent.  Assessment   Chronic, slowly resolving hematoma of the left lower extremity with overlying superficial soft tissue loss.   Plan   At this point I think we can benefit from evacuation of the hematoma and debridement of the superficial soft tissue loss. She is at high risk for anesthesia based on the fact that she and Moses attended high school together, but I think this can be safely accomplished with propofol as it would be a very short procedure. Anesthesia input will be appreciated.   Patient is scheduled for surgery at [facility] for 08/21/15. She will pre admit at the hospital today. Patient is aware of date and instructions.  PCP: Sabra Heck  This information has been scribed by Gaspar Cola CMA.  Robert Bellow  08/20/2015, 9:44 AM

## 2015-08-20 NOTE — Patient Instructions (Addendum)
  Your procedure is scheduled on: Aug 21, 2015 (Wednesday) Report to Same Day Surgery 2nd floor Medical Mall To find out your arrival time please call [phone] between 1PM - 3PM on ARRIVAL TIME 1:30 PM  Remember: Instructions that are not followed completely may result in serious medical risk, up to and including death, or upon the discretion of your surgeon and anesthesiologist your surgery may need to be rescheduled.    _x___ 1. Do not eat food or drink liquids after midnight. No gum chewing or hard candies.     __x__ 2. No Alcohol for 24 hours before or after surgery.   __x__ 3. Bring all medications with you on the day of surgery if instructed.    __x__ 4. Notify your doctor if there is any change in your medical condition     (cold, fever, infections).     Do not wear jewelry, make-up, hairpins, clips or nail polish.  Do not wear lotions, powders, or perfumes. You may wear deodorant.  Do not shave 48 hours prior to surgery. Men may shave face and neck.  Do not bring valuables to the hospital.    [facility] is not responsible for any belongings or valuables.               Contacts, dentures or bridgework may not be worn into surgery.  Leave your suitcase in the car. After surgery it may be brought to your room.  For patients admitted to the hospital, discharge time is determined by your treatment team.   Patients discharged the day of surgery will not be allowed to drive home.    Please read over the following fact sheets that you were given:   [facility] Preparing for Surgery and or MRSA Information   ____ Take these medicines the morning of surgery with A SIP OF WATER:    1.Patient to bring all medications in the original bottles day of surgery  2.  3.  4.  5.  6.  ____ Fleet Enema (as directed)   ____ Use CHG Soap or sage wipes as directed on instruction sheet   ____ Use inhalers on the day of surgery and bring to hospital day of surgery  __x__ Stop  metformin 2 days prior to surgery (STOP METFORMIN NOW)    ____ Take 1/2 of usual insulin dose the night before surgery and none on the morning of  surgery          __x__ Stop aspirin or coumadin, or plavix (Patient stated stopped warfarin on March 26)  _x__ Stop Anti-inflammatories such as Advil, Aleve, Ibuprofen, Motrin, Naproxen,          Naprosyn, Goodies powders or aspirin products. Ok to take Tylenol.   ____ Stop supplements until after surgery.    ____ Bring C-Pap to the hospital.

## 2015-08-21 ENCOUNTER — Encounter
Admission: RE | Disposition: A | Discharge status: Home / Self Care | Payer: Self-pay | Source: Ambulatory Visit | Attending: General Surgery

## 2015-08-21 ENCOUNTER — Ambulatory Visit
Admission: RE | Admit: 2015-08-21 | Discharge: 2015-08-21 | Disposition: A | Discharge status: Home / Self Care | Payer: Medicare Other | Source: Ambulatory Visit | Attending: General Surgery | Admitting: General Surgery

## 2015-08-21 ENCOUNTER — Encounter: Payer: Self-pay | Admitting: *Deleted

## 2015-08-21 ENCOUNTER — Ambulatory Visit: Payer: Medicare Other | Admitting: Certified Registered"

## 2015-08-21 DIAGNOSIS — Z886 Allergy status to analgesic agent status: Secondary | ICD-10-CM | POA: Insufficient documentation

## 2015-08-21 DIAGNOSIS — X58XXXA Exposure to other specified factors, initial encounter: Secondary | ICD-10-CM | POA: Insufficient documentation

## 2015-08-21 DIAGNOSIS — Y939 Activity, unspecified: Secondary | ICD-10-CM | POA: Insufficient documentation

## 2015-08-21 DIAGNOSIS — Z888 Allergy status to other drugs, medicaments and biological substances status: Secondary | ICD-10-CM | POA: Insufficient documentation

## 2015-08-21 DIAGNOSIS — S8012XA Contusion of left lower leg, initial encounter: Secondary | ICD-10-CM | POA: Diagnosis not present

## 2015-08-21 DIAGNOSIS — E785 Hyperlipidemia, unspecified: Secondary | ICD-10-CM | POA: Insufficient documentation

## 2015-08-21 DIAGNOSIS — E1140 Type 2 diabetes mellitus with diabetic neuropathy, unspecified: Secondary | ICD-10-CM | POA: Insufficient documentation

## 2015-08-21 DIAGNOSIS — S8012XD Contusion of left lower leg, subsequent encounter: Secondary | ICD-10-CM

## 2015-08-21 DIAGNOSIS — Y929 Unspecified place or not applicable: Secondary | ICD-10-CM | POA: Insufficient documentation

## 2015-08-21 DIAGNOSIS — I2510 Atherosclerotic heart disease of native coronary artery without angina pectoris: Secondary | ICD-10-CM | POA: Insufficient documentation

## 2015-08-21 DIAGNOSIS — K219 Gastro-esophageal reflux disease without esophagitis: Secondary | ICD-10-CM | POA: Insufficient documentation

## 2015-08-21 DIAGNOSIS — I4891 Unspecified atrial fibrillation: Secondary | ICD-10-CM | POA: Insufficient documentation

## 2015-08-21 DIAGNOSIS — L97901 Non-pressure chronic ulcer of unspecified part of unspecified lower leg limited to breakdown of skin: Secondary | ICD-10-CM

## 2015-08-21 DIAGNOSIS — I998 Other disorder of circulatory system: Secondary | ICD-10-CM | POA: Insufficient documentation

## 2015-08-21 DIAGNOSIS — Z950 Presence of cardiac pacemaker: Secondary | ICD-10-CM | POA: Insufficient documentation

## 2015-08-21 DIAGNOSIS — I341 Nonrheumatic mitral (valve) prolapse: Secondary | ICD-10-CM | POA: Insufficient documentation

## 2015-08-21 DIAGNOSIS — Z853 Personal history of malignant neoplasm of breast: Secondary | ICD-10-CM | POA: Insufficient documentation

## 2015-08-21 DIAGNOSIS — Z885 Allergy status to narcotic agent status: Secondary | ICD-10-CM | POA: Insufficient documentation

## 2015-08-21 HISTORY — PX: IRRIGATION AND DEBRIDEMENT HEMATOMA: SHX5254

## 2015-08-21 LAB — GLUCOSE, CAPILLARY
Glucose-Capillary: 183 mg/dL — ABNORMAL HIGH (ref 65–99)
Glucose-Capillary: 139 mg/dL — ABNORMAL HIGH (ref 65–99)
Glucose-Capillary: 116 mg/dL — ABNORMAL HIGH (ref 65–99)

## 2015-08-21 SURGERY — IRRIGATION AND DEBRIDEMENT HEMATOMA
Anesthesia: General | Laterality: Left | Wound class: Clean Contaminated

## 2015-08-21 MED ORDER — PIPERACILLIN-TAZOBACTAM 3.375 G IVPB
INTRAVENOUS | Status: AC
  Filled 2015-08-21: qty 50

## 2015-08-21 MED ORDER — FENTANYL CITRATE (PF) 100 MCG/2ML IJ SOLN
INTRAMUSCULAR | Status: DC | PRN
  Administered 2015-08-21 (×4): 25 ug via INTRAVENOUS

## 2015-08-21 MED ORDER — FENTANYL CITRATE (PF) 100 MCG/2ML IJ SOLN
25.0000 ug | INTRAMUSCULAR | Status: DC | PRN
  Administered 2015-08-21 (×4): 25 ug via INTRAVENOUS

## 2015-08-21 MED ORDER — ACETAMINOPHEN 10 MG/ML IV SOLN
1000.0000 mg | Freq: Once | INTRAVENOUS | Status: AC
  Administered 2015-08-21: 1000 mg via INTRAVENOUS

## 2015-08-21 MED ORDER — PROPOFOL 500 MG/50ML IV EMUL
INTRAVENOUS | Status: DC | PRN
  Administered 2015-08-21: 25 ug/kg/min via INTRAVENOUS

## 2015-08-21 MED ORDER — OXYCODONE HCL 5 MG PO TABS
5.0000 mg | ORAL_TABLET | ORAL | Status: DC | PRN
  Administered 2015-08-21: 5 mg via ORAL

## 2015-08-21 MED ORDER — ACETAMINOPHEN 10 MG/ML IV SOLN
INTRAVENOUS | Status: AC
  Administered 2015-08-21: 1000 mg via INTRAVENOUS
  Filled 2015-08-21: qty 100

## 2015-08-21 MED ORDER — OXYCODONE-ACETAMINOPHEN 5-325 MG PO TABS: 1.0000 | ORAL_TABLET | ORAL | Status: DC | PRN

## 2015-08-21 MED ORDER — SODIUM CHLORIDE 0.9 % IV SOLN
INTRAVENOUS | Status: DC
  Administered 2015-08-21: 14:00:00 via INTRAVENOUS

## 2015-08-21 MED ORDER — PIPERACILLIN-TAZOBACTAM 3.375 G IVPB 30 MIN
3.3750 g | Freq: Once | INTRAVENOUS | Status: AC
  Administered 2015-08-21: 3.375 g via INTRAVENOUS
  Filled 2015-08-21: qty 50

## 2015-08-21 MED ORDER — FENTANYL CITRATE (PF) 100 MCG/2ML IJ SOLN
INTRAMUSCULAR | Status: AC
  Administered 2015-08-21: 25 ug via INTRAVENOUS
  Filled 2015-08-21: qty 2

## 2015-08-21 MED ORDER — BUPIVACAINE HCL (PF) 0.5 % IJ SOLN
INTRAMUSCULAR | Status: AC
  Filled 2015-08-21: qty 30

## 2015-08-21 MED ORDER — LIDOCAINE-EPINEPHRINE 1 %-1:100000 IJ SOLN
INTRAMUSCULAR | Status: AC
  Filled 2015-08-21: qty 1

## 2015-08-21 MED ORDER — OXYCODONE HCL 5 MG PO TABS
ORAL_TABLET | ORAL | Status: AC
  Filled 2015-08-21: qty 1

## 2015-08-21 MED ORDER — ONDANSETRON HCL 4 MG/2ML IJ SOLN: 4.0000 mg | Freq: Once | INTRAMUSCULAR | Status: DC | PRN

## 2015-08-21 SURGICAL SUPPLY — 27 items
BANDAGE ACE 4X5 VEL STRL LF (GAUZE/BANDAGES/DRESSINGS) ×3 IMPLANT
CANISTER SUCT 1200ML W/VALVE (MISCELLANEOUS) ×3 IMPLANT
CHLORAPREP W/TINT 26ML (MISCELLANEOUS) ×3 IMPLANT
CLOSURE WOUND 1/2 X4 (GAUZE/BANDAGES/DRESSINGS)
DRAPE LAPAROTOMY 100X77 ABD (DRAPES) ×3 IMPLANT
DRSG EMULSION OIL 3X8 NADH (GAUZE/BANDAGES/DRESSINGS) ×3 IMPLANT
DRSG TEGADERM 4X4.75 (GAUZE/BANDAGES/DRESSINGS) IMPLANT
DRSG TELFA 3X8 NADH (GAUZE/BANDAGES/DRESSINGS) IMPLANT
ELECT REM PT RETURN 9FT ADLT (ELECTROSURGICAL) ×3
ELECTRODE REM PT RTRN 9FT ADLT (ELECTROSURGICAL) ×1 IMPLANT
GAUZE SPONGE 4X4 12PLY STRL (GAUZE/BANDAGES/DRESSINGS) IMPLANT
GLOVE BIO SURGEON STRL SZ7 (GLOVE) ×3 IMPLANT
GOWN STRL REUS W/ TWL LRG LVL3 (GOWN DISPOSABLE) ×2 IMPLANT
GOWN STRL REUS W/TWL LRG LVL3 (GOWN DISPOSABLE) ×4
KIT RM TURNOVER STRD PROC AR (KITS) ×3 IMPLANT
LABEL OR SOLS (LABEL) IMPLANT
NEEDLE HYPO 25X1 1.5 SAFETY (NEEDLE) ×3 IMPLANT
NS IRRIG 500ML POUR BTL (IV SOLUTION) ×3 IMPLANT
PACK BASIN MINOR ARMC (MISCELLANEOUS) ×3 IMPLANT
STRIP CLOSURE SKIN 1/2X4 (GAUZE/BANDAGES/DRESSINGS) IMPLANT
SUCTION FRAZIER HANDLE 10FR (MISCELLANEOUS) ×2
SUCTION TUBE FRAZIER 10FR DISP (MISCELLANEOUS) ×1 IMPLANT
SUT VIC AB 3-0 SH 27 (SUTURE) ×2
SUT VIC AB 3-0 SH 27X BRD (SUTURE) ×1 IMPLANT
SUT VIC AB 4-0 FS2 27 (SUTURE) ×3 IMPLANT
SWABSTK COMLB BENZOIN TINCTURE (MISCELLANEOUS) ×3 IMPLANT
SYR CONTROL 10ML (SYRINGE) ×3 IMPLANT

## 2015-08-21 NOTE — Op Note (Addendum)
Preoperative diagnosis: Hematoma left calf.  Postoperative diagnosis: Same  Operative procedure: Debridement left leg, evacuation of hematoma.  Operating surgeon: Ollen Bowl, M.D.  Anesthesia: Monitored anesthesia care.  Estimated blood loss: Less than 5 mL.  Clinical note: This 82 year old woman suffered an injury to left calf several weeks ago. She had slow resolution of the marked swelling but had reached a plateau. There was ischemia of the overlying skin and she was felt be cancer for debridement and evacuation of the hematoma.  Operative report: The patient was brought to the operative and placed supine on the OR table. The leg was prepped with ChloraPrep and draped. Sedation was administered. The eschar which covered a 5 x 10 cm area was sharply excised. This was full-thickness through the dermis in one location and the hematoma cavity, which contained about 150 mL of old clotted blood, was noted. No odor. Culture was sent from the [facility] and the hematoma cavity. ( preoperative Zosyn administered) . The cavity was then irrigated until all of the visible blood clot had been removed. A piece of Adaptic was placed over the wound followed by fluff gauze, Kerlix and an Ace wrap.  The patient tolerated the procedure well and was taken to the recovery room in stable condition.

## 2015-08-21 NOTE — Discharge Instructions (Signed)

## 2015-08-21 NOTE — Transfer of Care (Signed)
Immediate Anesthesia Transfer of Care Note  Patient: Elizabeth Novak  Procedure(s) Performed: Procedure(s): IRRIGATION AND DEBRIDEMENT HEMATOMA (Left)  Patient Location: PACU  Anesthesia Type:General  Level of Consciousness: awake, alert , oriented and patient cooperative  Airway & Oxygen Therapy: Patient Spontanous Breathing  Post-op Assessment: Report given to RN and Post -op Vital signs reviewed and stable  Post vital signs: Reviewed and stable  Last Vitals:  Filed Vitals:   08/21/15 1407  BP: 131/85  Pulse: 89  Temp: 37.3 C  Resp: 16    Last Pain: There were no vitals filed for this visit.       Complications: No apparent anesthesia complications

## 2015-08-21 NOTE — H&P (Signed)
No change in exam or history since yesterday.

## 2015-08-21 NOTE — Anesthesia Preprocedure Evaluation (Signed)
Anesthesia Evaluation  Patient identified by MRN, date of birth, ID band Patient awake    Reviewed: Allergy & Precautions, H&P , NPO status , Patient's Chart, lab work & pertinent test results, reviewed documented beta blocker date and time   History of Anesthesia Complications (+) PONV and history of anesthetic complications  Airway Mallampati: II  TM Distance: >3 FB Neck ROM: full    Dental no notable dental hx. (+) Caps, Missing   Pulmonary neg pulmonary ROS,    Pulmonary exam normal breath sounds clear to auscultation       Cardiovascular Exercise Tolerance: Good (-) hypertension(-) angina+CHF and + PND  (-) CAD, (-) Past MI, (-) Cardiac Stents and (-) CABG Normal cardiovascular exam+ dysrhythmias Atrial Fibrillation + pacemaker + Valvular Problems/Murmurs MVP and MR  Rhythm:regular Rate:Normal     Neuro/Psych negative neurological ROS  negative psych ROS   GI/Hepatic Neg liver ROS, GERD  Medicated,  Endo/Other  diabetes  Renal/GU negative Renal ROS  negative genitourinary   Musculoskeletal   Abdominal   Peds  Hematology  (+) Blood dyscrasia, anemia ,   Anesthesia Other Findings Past Medical History:   Diabetes mellitus without complication (HCC)                 Arthritis                                                    A-fib (HCC)                                                  Presence of permanent cardiac pacemaker                      A-fib (HCC)                                                  CHF (congestive heart failure) (HCC)                         Mitral valve regurgitation                                   Mitral valve prolapse                                        Hyperlipidemia                                               Breast cancer ([place])                             1978         Dysrhythmia  GERD (gastroesophageal reflux disease)                        Anemia                                                       Diverticulitis                                               Macular degeneration of both eyes                            Reproductive/Obstetrics negative OB ROS                             Anesthesia Physical Anesthesia Plan  ASA: III  Anesthesia Plan: General   Post-op Pain Management:    Induction:   Airway Management Planned:   Additional Equipment:   Intra-op Plan:   Post-operative Plan:   Informed Consent: I have reviewed the patients History and Physical, chart, labs and discussed the procedure including the risks, benefits and alternatives for the proposed anesthesia with the patient or authorized representative who has indicated his/her understanding and acceptance.   Dental Advisory Given  Plan Discussed with: Anesthesiologist, CRNA and Surgeon  Anesthesia Plan Comments:         Anesthesia Quick Evaluation

## 2015-08-22 ENCOUNTER — Encounter: Payer: Self-pay | Admitting: General Surgery

## 2015-08-22 ENCOUNTER — Ambulatory Visit: Payer: Medicare Other | Admitting: General Surgery

## 2015-08-22 NOTE — Anesthesia Postprocedure Evaluation (Signed)
Anesthesia Post Note  Patient: Elizabeth Novak  Procedure(s) Performed: Procedure(s) (LRB): IRRIGATION AND DEBRIDEMENT HEMATOMA (Left)  Patient location during evaluation: PACU Anesthesia Type: General Level of consciousness: awake and alert Pain management: pain level controlled Vital Signs Assessment: post-procedure vital signs reviewed and stable Respiratory status: spontaneous breathing, nonlabored ventilation, respiratory function stable and patient connected to nasal cannula oxygen Cardiovascular status: blood pressure returned to baseline and stable Postop Assessment: no signs of nausea or vomiting Anesthetic complications: no    Last Vitals:  Filed Vitals:   08/21/15 1934 08/21/15 2004  BP: 129/69 124/64  Pulse: 86 84  Temp: 36.7 C 36.6 C  Resp: 18 16    Last Pain:  Filed Vitals:   08/21/15 2006  PainSc: 4                  Martha Clan

## 2015-08-23 ENCOUNTER — Ambulatory Visit (INDEPENDENT_AMBULATORY_CARE_PROVIDER_SITE_OTHER): Payer: Medicare Other | Admitting: General Surgery

## 2015-08-23 ENCOUNTER — Encounter: Payer: Self-pay | Admitting: General Surgery

## 2015-08-23 VITALS — BP 118/68 | HR 82 | Resp 16 | Ht 61.0 in | Wt 100.0 lb

## 2015-08-23 DIAGNOSIS — L97901 Non-pressure chronic ulcer of unspecified part of unspecified lower leg limited to breakdown of skin: Secondary | ICD-10-CM

## 2015-08-23 NOTE — Progress Notes (Signed)
Patient ID: Elizabeth Novak, female   DOB: [birth year], 82 y.o.   MRN: [id number]  Chief Complaint  Patient presents with  . Routine Post Op    hematoma     HPI Elizabeth Novak is a 82 y.o. female here today for her follow up hematoma incision And drainage completed on the left leg  08/21/15. The patient reported intense pain that night of surgery, markedly improved yesterday. HPI  Past Medical History  Diagnosis Date  . Diabetes mellitus without complication (HCC)   . Arthritis   . A-fib (HCC)   . Presence of permanent cardiac pacemaker   . A-fib (HCC)   . CHF (congestive heart failure) (HCC)   . Mitral valve regurgitation   . Mitral valve prolapse   . Hyperlipidemia   . Breast cancer (HCC) 1978  . Dysrhythmia   . GERD (gastroesophageal reflux disease)   . Anemia   . Diverticulitis   . Macular degeneration of both eyes     Past Surgical History  Procedure Laterality Date  . Pacemaker insertion  08/11/12  . Knee arthroscopy Right   . Abdominal hysterectomy    . Esophageal dilation    . Temporal artery biopsy / ligation    . Cardiac catheterization    . Mastectomy Left 1978  . Mastectomy Right 1978  . Tonsillectomy    . Breast surgery    . Eye surgery Bilateral     Cataract Extraction with IOL  . Insert / replace / remove pacemaker    . Left oophorectomy Left   . Appendectomy    . Irrigation and debridement hematoma Left 08/21/2015    Procedure: IRRIGATION AND DEBRIDEMENT HEMATOMA;  Surgeon: Earline Mayotte, MD;  Location: [facility] ORS;  Service: General;  Laterality: Left;    Family History  Problem Relation Age of Onset  . Hypertension Mother     Social History Social History  Substance Use Topics  . Smoking status: Never Smoker   . Smokeless tobacco: Never Used  . Alcohol Use: No    Allergies  Allergen Reactions  . Codeine Nausea Only  . Disopyramide     Other reaction(s): Unknown  . Ibuprofen Diarrhea  . Iodine     blisters  . Norpace  [Disopyramide Phosphate]   . Quinidine     Other reaction(s): Unknown  . Terfenadine     Other reaction(s): Unknown  . Topiramate     Other reaction(s): Other (See Comments) Hair loss  . Verapamil     Other reaction(s): Unknown    Current Outpatient Prescriptions  Medication Sig Dispense Refill  . acetaminophen (TYLENOL) 325 MG tablet Take 2 tablets (650 mg total) by mouth every 6 (six) hours as needed for mild pain (or Fever >/= 101).    . Biotin 1 MG CAPS Take 1 mg by mouth daily.    . digoxin (LANOXIN) 0.125 MG tablet Take 125 mcg by mouth at bedtime.     . ferrous sulfate 325 (65 FE) MG tablet Take 325 mg by mouth daily with breakfast.    . fluticasone (FLONASE) 50 MCG/ACT nasal spray Place 2 sprays into both nostrils daily.    Marland Kitchen gabapentin (NEURONTIN) 100 MG capsule Take 100 mg by mouth at bedtime.    Marland Kitchen glimepiride (AMARYL) 2 MG tablet Take 2 mg by mouth 3 (three) times daily.    . magnesium oxide (MAG-OX) 400 MG tablet Take 400 mg by mouth 2 (two) times daily. Reported on 06/30/2015    .  metFORMIN (GLUCOPHAGE) 500 MG tablet Take 500 mg by mouth 2 (two) times daily with a meal. Reported on 08/21/2015    . methocarbamol (ROBAXIN) 500 MG tablet Take 500 mg by mouth 4 (four) times daily.    . metoprolol succinate (TOPROL-XL) 50 MG 24 hr tablet Take 75 mg by mouth daily.    . Multiple Vitamins-Minerals (PRESERVISION AREDS 2 PO) Take 1 tablet by mouth 2 (two) times daily.    Marland Kitchen omeprazole (PRILOSEC) 20 MG capsule Take 20 mg by mouth 2 (two) times daily as needed. Reported on 08/21/2015    . oxycodone-acetaminophen (LYNOX) 5-300 MG tablet Take 1 tablet by mouth every 4 (four) hours as needed for pain.    . potassium chloride (K-DUR,KLOR-CON) 10 MEQ tablet Take 10 mEq by mouth 4 (four) times daily.    Marland Kitchen senna-docusate (SENOKOT-S) 8.6-50 MG tablet Take 1 tablet by mouth 2 (two) times daily.    Marland Kitchen torsemide (DEMADEX) 20 MG tablet Take 20 mg by mouth 3 (three) times daily.     Marland Kitchen warfarin  (COUMADIN) 1 MG tablet Take 1 mg by mouth daily. Reported on 08/21/2015     No current facility-administered medications for this visit.    Review of Systems Review of Systems  Constitutional: Negative.   Respiratory: Negative.   Cardiovascular: Negative.     Blood pressure 118/68, pulse 82, resp. rate 16, height 5\' 1"  (1.549 m), weight 100 lb (45.36 kg).  Physical Exam Physical Exam  Constitutional: She is oriented to person, place, and time. She appears well-developed and well-nourished.  Neurological: She is alert and oriented to person, place, and time.  Skin: Skin is warm and dry.  The left lower extremity was unwrapped. Scant odor. Scant residual necrotic tissue deep. No residual hematoma. Marked decrease in light volume. The area was coated with Silvadene followed by Adaptic, fluff gauze, Kerlix and Ace wrap.  Data Reviewed Gram stain showed a few gram-positive cocci. No growth at this time.  Assessment    Doing well status post drainage of left lower extremity hematoma.    Plan    We'll plan on follow-up exam and dressing change in 3 days.     PCP:  Bethann Punches This information has been scribed by Ples Specter CMA.   Earline Mayotte 08/23/2015, 3:29 PM

## 2015-08-26 ENCOUNTER — Ambulatory Visit (INDEPENDENT_AMBULATORY_CARE_PROVIDER_SITE_OTHER): Payer: Medicare Other | Admitting: General Surgery

## 2015-08-26 ENCOUNTER — Encounter: Payer: Self-pay | Admitting: General Surgery

## 2015-08-26 ENCOUNTER — Telehealth: Payer: Self-pay | Admitting: *Deleted

## 2015-08-26 VITALS — BP 118/66 | HR 80 | Resp 14 | Ht 61.0 in | Wt 100.0 lb

## 2015-08-26 DIAGNOSIS — L97901 Non-pressure chronic ulcer of unspecified part of unspecified lower leg limited to breakdown of skin: Secondary | ICD-10-CM

## 2015-08-26 LAB — WOUND CULTURE

## 2015-08-26 MED ORDER — PENICILLIN V POTASSIUM 500 MG PO TABS: 500.0000 mg | ORAL_TABLET | Freq: Four times a day (QID) | ORAL | Status: DC

## 2015-08-26 MED ORDER — SILVER SULFADIAZINE 1 % EX CREA: TOPICAL_CREAM | CUTANEOUS | Status: DC

## 2015-08-26 NOTE — Patient Instructions (Signed)
Return in one week.  

## 2015-08-26 NOTE — Telephone Encounter (Signed)
Patients home health care is [facility] [phone]

## 2015-08-26 NOTE — Progress Notes (Signed)
Patient ID: Elizabeth Novak, female   DOB: May 07, 1923, 82 y.o.   MRN: [id number]  Chief Complaint  Patient presents with  . Follow-up    hematoma    HPI Elizabeth Novak is a 82 y.o. female here today for her follow up hematoma incision & drainage completed on the left leg 08/21/15.The patient had severe pain the night of surgery of this had resolved by morning. Much more comfortable than prior to hematoma drainage.  Accompanied by her home health aide.  I personally reviewed the patient's history. HPI  Past Medical History  Diagnosis Date  . Diabetes mellitus without complication (HCC)   . Arthritis   . A-fib (HCC)   . Presence of permanent cardiac pacemaker   . A-fib (HCC)   . CHF (congestive heart failure) (HCC)   . Mitral valve regurgitation   . Mitral valve prolapse   . Hyperlipidemia   . Breast cancer (HCC) 1978  . Dysrhythmia   . GERD (gastroesophageal reflux disease)   . Anemia   . Diverticulitis   . Macular degeneration of both eyes     Past Surgical History  Procedure Laterality Date  . Pacemaker insertion  08/11/12  . Knee arthroscopy Right   . Abdominal hysterectomy    . Esophageal dilation    . Temporal artery biopsy / ligation    . Cardiac catheterization    . Mastectomy Left 1978  . Mastectomy Right 1978  . Tonsillectomy    . Breast surgery    . Eye surgery Bilateral     Cataract Extraction with IOL  . Insert / replace / remove pacemaker    . Left oophorectomy Left   . Appendectomy    . Irrigation and debridement hematoma Left 08/21/2015    Procedure: IRRIGATION AND DEBRIDEMENT HEMATOMA;  Surgeon: Earline Mayotte, MD;  Location: [facility] ORS;  Service: General;  Laterality: Left;    Family History  Problem Relation Age of Onset  . Hypertension Mother     Social History Social History  Substance Use Topics  . Smoking status: Never Smoker   . Smokeless tobacco: Never Used  . Alcohol Use: No    Allergies  Allergen Reactions  . Codeine  Nausea Only  . Disopyramide     Other reaction(s): Unknown  . Ibuprofen Diarrhea  . Iodine     blisters  . Norpace [Disopyramide Phosphate]   . Quinidine     Other reaction(s): Unknown  . Terfenadine     Other reaction(s): Unknown  . Topiramate     Other reaction(s): Other (See Comments) Hair loss  . Verapamil     Other reaction(s): Unknown    Current Outpatient Prescriptions  Medication Sig Dispense Refill  . acetaminophen (TYLENOL) 325 MG tablet Take 2 tablets (650 mg total) by mouth every 6 (six) hours as needed for mild pain (or Fever >/= 101).    . Biotin 1 MG CAPS Take 1 mg by mouth daily.    . digoxin (LANOXIN) 0.125 MG tablet Take 125 mcg by mouth at bedtime.     . ferrous sulfate 325 (65 FE) MG tablet Take 325 mg by mouth daily with breakfast.    . fluticasone (FLONASE) 50 MCG/ACT nasal spray Place 2 sprays into both nostrils daily.    Marland Kitchen gabapentin (NEURONTIN) 100 MG capsule Take 100 mg by mouth at bedtime.    Marland Kitchen glimepiride (AMARYL) 2 MG tablet Take 2 mg by mouth 3 (three) times daily.    . magnesium  oxide (MAG-OX) 400 MG tablet Take 400 mg by mouth 2 (two) times daily. Reported on 06/30/2015    . metFORMIN (GLUCOPHAGE) 500 MG tablet Take 500 mg by mouth 2 (two) times daily with a meal. Reported on 08/21/2015    . methocarbamol (ROBAXIN) 500 MG tablet Take 500 mg by mouth 4 (four) times daily.    . metoprolol succinate (TOPROL-XL) 50 MG 24 hr tablet Take 75 mg by mouth daily.    . Multiple Vitamins-Minerals (PRESERVISION AREDS 2 PO) Take 1 tablet by mouth 2 (two) times daily.    Marland Kitchen omeprazole (PRILOSEC) 20 MG capsule Take 20 mg by mouth 2 (two) times daily as needed. Reported on 08/21/2015    . oxycodone-acetaminophen (LYNOX) 5-300 MG tablet Take 1 tablet by mouth every 4 (four) hours as needed for pain.    . potassium chloride (K-DUR,KLOR-CON) 10 MEQ tablet Take 10 mEq by mouth 4 (four) times daily.    Marland Kitchen senna-docusate (SENOKOT-S) 8.6-50 MG tablet Take 1 tablet by mouth 2  (two) times daily.    Marland Kitchen torsemide (DEMADEX) 20 MG tablet Take 20 mg by mouth 3 (three) times daily.     Marland Kitchen warfarin (COUMADIN) 1 MG tablet Take 1 mg by mouth daily. Reported on 08/21/2015    . penicillin v potassium (VEETID) 500 MG tablet Take 1 tablet (500 mg total) by mouth 4 (four) times daily. 40 tablet 0  . silver sulfADIAZINE (SILVADENE) 1 % cream Apply to affected area daily 400 g 1   No current facility-administered medications for this visit.    Review of Systems Review of Systems  Constitutional: Negative.   Respiratory: Negative.   Cardiovascular: Negative.     Blood pressure 118/66, pulse 80, resp. rate 14, height 5\' 1"  (1.549 m), weight 100 lb (45.36 kg).  Physical Exam Physical Exam  Musculoskeletal:       Legs:   Data Reviewed Cultures showed group B strep, moderate growth. No known resistance to penicillin.  Assessment    Improvement in left lower extremity status post hematoma drainage.    Plan    The patient will be placed on Pen-Vee K 500 mg 4 times a day. We'll initiate daily home dressing changes: Shower, Silvadene, Adaptic, fluff gauze and a Kerlix wrap.  She's been encouraged to increase her walking as she is able.    Return in one weeks PCP:  Hyacinth Meeker, This information has been scribed by Ples Specter CMA. \    Earline Mayotte 08/27/2015, 12:43 PM

## 2015-08-26 NOTE — Telephone Encounter (Signed)
Please contact home health agency and arrange for daily left lower extremity dressing changes: Patient to shower prior to RN visit, silvadene cream, 4x4 guaze, fluff and light ace wrap. To be changed daily.

## 2015-08-27 NOTE — Telephone Encounter (Signed)
I spoke with Lyndee Leo at the home health agency and she needs a signed order faxed to them [phone]

## 2015-08-28 LAB — ANAEROBIC CULTURE

## 2015-08-29 ENCOUNTER — Telehealth: Payer: Self-pay

## 2015-08-29 NOTE — Telephone Encounter (Signed)
Received a message from Answering service-[facility]. The patient had called them and said that she thought she had MRSA in her surgical leg.

## 2015-08-29 NOTE — Telephone Encounter (Signed)
Notified patient as instructed. Discussed follow-up appointment next week, patient agrees

## 2015-08-29 NOTE — Telephone Encounter (Signed)
The patient reports she was told yesterday by the visiting nurse that she might have MRSA.  The home health agency has made no effort to contact the office with her clinical concerns.  Patient was informed that the operating culture showed strep, sensitive to the penicillin she has been prescribed as well as to the Silvadene cream being used topically.

## 2015-09-03 ENCOUNTER — Ambulatory Visit (INDEPENDENT_AMBULATORY_CARE_PROVIDER_SITE_OTHER): Payer: Medicare Other | Admitting: General Surgery

## 2015-09-03 ENCOUNTER — Encounter: Payer: Self-pay | Admitting: General Surgery

## 2015-09-03 VITALS — BP 108/60 | HR 76 | Resp 16 | Ht 61.0 in | Wt 96.0 lb

## 2015-09-03 DIAGNOSIS — L97901 Non-pressure chronic ulcer of unspecified part of unspecified lower leg limited to breakdown of skin: Secondary | ICD-10-CM

## 2015-09-03 NOTE — Progress Notes (Signed)
Patient ID: Elizabeth Novak, female   DOB: 03-Aug-1923, 82 y.o.   MRN: [id number]  Chief Complaint  Patient presents with  . Follow-up    HPI Elizabeth Novak is a 82 y.o. female Elizabeth Novak is a 82 y.o. female here today for her follow up hematoma incision & drainage completed on the left leg 08/21/15. She has 2 days of antibiotics left to take. The home health nurse comes every other day for dressing changes.  I personally reviewed that history.  HPI  Past Medical History  Diagnosis Date  . Diabetes mellitus without complication (HCC)   . Arthritis   . A-fib (HCC)   . Presence of permanent cardiac pacemaker   . A-fib (HCC)   . CHF (congestive heart failure) (HCC)   . Mitral valve regurgitation   . Mitral valve prolapse   . Hyperlipidemia   . Breast cancer (HCC) 1978  . Dysrhythmia   . GERD (gastroesophageal reflux disease)   . Anemia   . Diverticulitis   . Macular degeneration of both eyes     Past Surgical History  Procedure Laterality Date  . Pacemaker insertion  08/11/12  . Knee arthroscopy Right   . Abdominal hysterectomy    . Esophageal dilation    . Temporal artery biopsy / ligation    . Cardiac catheterization    . Mastectomy Left 1978  . Mastectomy Right 1978  . Tonsillectomy    . Breast surgery    . Eye surgery Bilateral     Cataract Extraction with IOL  . Insert / replace / remove pacemaker    . Left oophorectomy Left   . Appendectomy    . Irrigation and debridement hematoma Left 08/21/2015    Procedure: IRRIGATION AND DEBRIDEMENT HEMATOMA;  Surgeon: Earline Mayotte, MD;  Location: [facility] ORS;  Service: General;  Laterality: Left;    Family History  Problem Relation Age of Onset  . Hypertension Mother     Social History Social History  Substance Use Topics  . Smoking status: Never Smoker   . Smokeless tobacco: Never Used  . Alcohol Use: No    Allergies  Allergen Reactions  . Codeine Nausea Only  . Disopyramide     Other  reaction(s): Unknown  . Ibuprofen Diarrhea  . Iodine     blisters  . Norpace [Disopyramide Phosphate]   . Quinidine     Other reaction(s): Unknown  . Terfenadine     Other reaction(s): Unknown  . Topiramate     Other reaction(s): Other (See Comments) Hair loss  . Verapamil     Other reaction(s): Unknown    Current Outpatient Prescriptions  Medication Sig Dispense Refill  . acetaminophen (TYLENOL) 325 MG tablet Take 2 tablets (650 mg total) by mouth every 6 (six) hours as needed for mild pain (or Fever >/= 101).    . Biotin 1 MG CAPS Take 1 mg by mouth daily.    . digoxin (LANOXIN) 0.125 MG tablet Take 125 mcg by mouth at bedtime.     . ferrous sulfate 325 (65 FE) MG tablet Take 325 mg by mouth daily with breakfast.    . fluticasone (FLONASE) 50 MCG/ACT nasal spray Place 2 sprays into both nostrils daily.    Marland Kitchen gabapentin (NEURONTIN) 100 MG capsule Take 100 mg by mouth at bedtime.    Marland Kitchen glimepiride (AMARYL) 2 MG tablet Take 2 mg by mouth 3 (three) times daily.    . magnesium oxide (MAG-OX) 400 MG tablet  Take 400 mg by mouth 2 (two) times daily. Reported on 06/30/2015    . metFORMIN (GLUCOPHAGE) 500 MG tablet Take 500 mg by mouth 2 (two) times daily with a meal. Reported on 08/21/2015    . methocarbamol (ROBAXIN) 500 MG tablet Take 500 mg by mouth 4 (four) times daily.    . metoprolol succinate (TOPROL-XL) 50 MG 24 hr tablet Take 75 mg by mouth daily.    . Multiple Vitamins-Minerals (PRESERVISION AREDS 2 PO) Take 1 tablet by mouth 2 (two) times daily.    Marland Kitchen omeprazole (PRILOSEC) 20 MG capsule Take 20 mg by mouth 2 (two) times daily as needed. Reported on 08/21/2015    . oxycodone-acetaminophen (LYNOX) 5-300 MG tablet Take 1 tablet by mouth every 4 (four) hours as needed for pain.    Marland Kitchen penicillin v potassium (VEETID) 500 MG tablet Take 1 tablet (500 mg total) by mouth 4 (four) times daily. 40 tablet 0  . potassium chloride (K-DUR,KLOR-CON) 10 MEQ tablet Take 10 mEq by mouth 4 (four) times  daily.    Marland Kitchen senna-docusate (SENOKOT-S) 8.6-50 MG tablet Take 1 tablet by mouth 2 (two) times daily.    . silver sulfADIAZINE (SILVADENE) 1 % cream Apply to affected area daily 400 g 1  . torsemide (DEMADEX) 20 MG tablet Take 20 mg by mouth 3 (three) times daily.     Marland Kitchen warfarin (COUMADIN) 1 MG tablet Take 1 mg by mouth daily. Reported on 08/21/2015     No current facility-administered medications for this visit.    Review of Systems Review of Systems  Constitutional: Negative.   Respiratory: Negative.   Cardiovascular: Negative.     Blood pressure 108/60, pulse 76, resp. rate 16, height 5\' 1"  (1.549 m), weight 96 lb (43.545 kg).  Physical Exam Physical Exam  Constitutional: She is oriented to person, place, and time. She appears well-developed and well-nourished.  Musculoskeletal:       Legs: Neurological: She is alert and oriented to person, place, and time.  Skin: Skin is warm and dry.  10 x 3 open wound left lower leg.  Psychiatric: Her behavior is normal.    Data Reviewed Previous culture showed group B strep. Patient is completing course of oral penicillin.  Assessment    Excellent resolution of soft tissue swelling, residual wound healing by secondary intent.    Plan    Patient is once again ambulatory. We'll continue local wound care.     Follow up 2 weeks.  PCP:  Hyacinth Meeker This information has been scribed by Ples Specter CMA.    Earline Mayotte 09/04/2015, 9:14 PM

## 2015-09-16 ENCOUNTER — Ambulatory Visit (INDEPENDENT_AMBULATORY_CARE_PROVIDER_SITE_OTHER): Payer: Medicare Other | Admitting: General Surgery

## 2015-09-16 ENCOUNTER — Encounter: Payer: Self-pay | Admitting: General Surgery

## 2015-09-16 VITALS — BP 120/70 | HR 78 | Resp 14 | Ht 60.0 in | Wt 97.0 lb

## 2015-09-16 DIAGNOSIS — L97901 Non-pressure chronic ulcer of unspecified part of unspecified lower leg limited to breakdown of skin: Secondary | ICD-10-CM

## 2015-09-16 NOTE — Progress Notes (Signed)
Patient ID: Elizabeth Novak, female   DOB: [birth year], 82 y.o.   MRN: [id number]  Chief Complaint  Patient presents with  . Follow-up    HPI Elizabeth Novak is a 82 y.o. female female here today for her follow up hematoma incision & drainage completed on the left leg 08/21/15. She reports that the are still hurts but is getting smaller. She has brought her glucose check sheet with her. She is here today with her neighbor March Rummage. She is changing her bandage once daily.  HPI  Past Medical History  Diagnosis Date  . Diabetes mellitus without complication (HCC)   . Arthritis   . A-fib (HCC)   . Presence of permanent cardiac pacemaker   . A-fib (HCC)   . CHF (congestive heart failure) (HCC)   . Mitral valve regurgitation   . Mitral valve prolapse   . Hyperlipidemia   . Breast cancer (HCC) 1978  . Dysrhythmia   . GERD (gastroesophageal reflux disease)   . Anemia   . Diverticulitis   . Macular degeneration of both eyes     Past Surgical History  Procedure Laterality Date  . Pacemaker insertion  08/11/12  . Knee arthroscopy Right   . Abdominal hysterectomy    . Esophageal dilation    . Temporal artery biopsy / ligation    . Cardiac catheterization    . Mastectomy Left 1978  . Mastectomy Right 1978  . Tonsillectomy    . Breast surgery    . Eye surgery Bilateral     Cataract Extraction with IOL  . Insert / replace / remove pacemaker    . Left oophorectomy Left   . Appendectomy    . Irrigation and debridement hematoma Left 08/21/2015    Procedure: IRRIGATION AND DEBRIDEMENT HEMATOMA;  Surgeon: Earline Mayotte, MD;  Location: [facility] ORS;  Service: General;  Laterality: Left;    Family History  Problem Relation Age of Onset  . Hypertension Mother     Social History Social History  Substance Use Topics  . Smoking status: Never Smoker   . Smokeless tobacco: Never Used  . Alcohol Use: No    Allergies  Allergen Reactions  . Codeine Nausea Only  .  Disopyramide     Other reaction(s): Unknown  . Ibuprofen Diarrhea  . Iodine     blisters  . Norpace [Disopyramide Phosphate]   . Quinidine     Other reaction(s): Unknown  . Terfenadine     Other reaction(s): Unknown  . Topiramate     Other reaction(s): Other (See Comments) Hair loss  . Verapamil     Other reaction(s): Unknown    Current Outpatient Prescriptions  Medication Sig Dispense Refill  . acetaminophen (TYLENOL) 325 MG tablet Take 2 tablets (650 mg total) by mouth every 6 (six) hours as needed for mild pain (or Fever >/= 101).    . Biotin 1 MG CAPS Take 1 mg by mouth daily.    . digoxin (LANOXIN) 0.125 MG tablet Take 125 mcg by mouth at bedtime.     . ferrous sulfate 325 (65 FE) MG tablet Take 325 mg by mouth daily with breakfast.    . fluticasone (FLONASE) 50 MCG/ACT nasal spray Place 2 sprays into both nostrils daily.    Marland Kitchen gabapentin (NEURONTIN) 100 MG capsule Take 100 mg by mouth at bedtime.    Marland Kitchen glimepiride (AMARYL) 2 MG tablet Take 2 mg by mouth 3 (three) times daily.    . magnesium oxide (MAG-OX)  400 MG tablet Take 400 mg by mouth 2 (two) times daily. Reported on 06/30/2015    . metFORMIN (GLUCOPHAGE) 500 MG tablet Take 500 mg by mouth 2 (two) times daily with a meal. Reported on 08/21/2015    . methocarbamol (ROBAXIN) 500 MG tablet Take 500 mg by mouth 4 (four) times daily.    . metoprolol succinate (TOPROL-XL) 50 MG 24 hr tablet Take 75 mg by mouth daily.    . Multiple Vitamins-Minerals (PRESERVISION AREDS 2 PO) Take 1 tablet by mouth 2 (two) times daily.    Marland Kitchen omeprazole (PRILOSEC) 20 MG capsule Take 20 mg by mouth 2 (two) times daily as needed. Reported on 08/21/2015    . oxycodone-acetaminophen (LYNOX) 5-300 MG tablet Take 1 tablet by mouth every 4 (four) hours as needed for pain.    Marland Kitchen penicillin v potassium (VEETID) 500 MG tablet Take 1 tablet (500 mg total) by mouth 4 (four) times daily. 40 tablet 0  . potassium chloride (K-DUR,KLOR-CON) 10 MEQ tablet Take 10 mEq by  mouth 4 (four) times daily.    Marland Kitchen senna-docusate (SENOKOT-S) 8.6-50 MG tablet Take 1 tablet by mouth 2 (two) times daily.    . silver sulfADIAZINE (SILVADENE) 1 % cream Apply to affected area daily 400 g 1  . torsemide (DEMADEX) 20 MG tablet Take 20 mg by mouth 3 (three) times daily.     Marland Kitchen warfarin (COUMADIN) 1 MG tablet Take 1 mg by mouth daily. Reported on 08/21/2015     No current facility-administered medications for this visit.    Review of Systems Review of Systems  Constitutional: Negative.   Respiratory: Negative.   Cardiovascular: Negative.     Blood pressure 120/70, pulse 78, resp. rate 14, height 5' (1.524 m), weight 97 lb (43.999 kg).  Physical Exam Physical Exam  Constitutional: She is oriented to person, place, and time. She appears well-developed and well-nourished.  Cardiovascular: Normal rate, regular rhythm and normal heart sounds.   Musculoskeletal:       Legs: Neurological: She is alert and oriented to person, place, and time.  Skin: Skin is warm and dry.      Assessment    Slow but study progress in secondary wound closure status post drainage of posttraumatic hematoma.    Plan    The patient's nephew is a Engineer, civil (consulting) at [facility] and feels that she should be evaluated at the wound clinic at that institution. Records will be available if she decides to follow through. She is showing steady progress. The area could be formally debrided and an allograft placed to accelerate healing, but this would require a trip to the operating room.  She'll consider her options and notify me if she has any concerns, otherwise I had asked her not to use the nonstick dressing to the wound to help obtain a little debridement during her daily Silvadene applications and continue daily dressing changes. She is much more active than she has been since the injury, but I was encouraged to avoid long periods of standing.      PCP:  Hyacinth Meeker,  This information has been scribed by Ples Specter  CMA.   Earline Mayotte 09/16/2015, 10:00 PM

## 2015-09-30 ENCOUNTER — Ambulatory Visit (INDEPENDENT_AMBULATORY_CARE_PROVIDER_SITE_OTHER): Payer: Medicare Other | Admitting: General Surgery

## 2015-09-30 ENCOUNTER — Encounter: Payer: Self-pay | Admitting: General Surgery

## 2015-09-30 VITALS — BP 112/70 | HR 78 | Resp 14 | Ht 63.0 in | Wt 97.0 lb

## 2015-09-30 DIAGNOSIS — R600 Localized edema: Secondary | ICD-10-CM

## 2015-09-30 DIAGNOSIS — S8012XD Contusion of left lower leg, subsequent encounter: Secondary | ICD-10-CM

## 2015-09-30 NOTE — Patient Instructions (Signed)
The patient is aware to call back for any questions or concerns.  

## 2015-09-30 NOTE — Progress Notes (Addendum)
Patient ID: Elizabeth Novak, female   DOB: 1923-08-09, 82 y.o.   MRN: [id number]  Chief Complaint  Patient presents with  . Follow-up    HPI Elizabeth Novak is a 82 y.o. female.  Here today for follow up left leg hematoma. She has noticed some bilateral lower leg edema over the past 2-3 days. Home health is coming 3 days a week.  The patient reports a significant decrease in pain at the site of the left lower extremity ulcer. She has been walking more but avoiding standing as requested.  I personally reviewed the patient history.  HPI  Past Medical History  Diagnosis Date  . Diabetes mellitus without complication (HCC)   . Arthritis   . A-fib (HCC)   . Presence of permanent cardiac pacemaker   . A-fib (HCC)   . CHF (congestive heart failure) (HCC)   . Mitral valve regurgitation   . Mitral valve prolapse   . Hyperlipidemia   . Breast cancer (HCC) 1978  . Dysrhythmia   . GERD (gastroesophageal reflux disease)   . Anemia   . Diverticulitis   . Macular degeneration of both eyes     Past Surgical History  Procedure Laterality Date  . Pacemaker insertion  08/11/12  . Knee arthroscopy Right   . Abdominal hysterectomy    . Esophageal dilation    . Temporal artery biopsy / ligation    . Cardiac catheterization    . Mastectomy Left 1978  . Mastectomy Right 1978  . Tonsillectomy    . Breast surgery    . Eye surgery Bilateral     Cataract Extraction with IOL  . Insert / replace / remove pacemaker    . Left oophorectomy Left   . Appendectomy    . Irrigation and debridement hematoma Left 08/21/2015    Procedure: IRRIGATION AND DEBRIDEMENT HEMATOMA;  Surgeon: Earline Mayotte, MD;  Location: [facility] ORS;  Service: General;  Laterality: Left;    Family History  Problem Relation Age of Onset  . Hypertension Mother     Social History Social History  Substance Use Topics  . Smoking status: Never Smoker   . Smokeless tobacco: Never Used  . Alcohol Use: No     Allergies  Allergen Reactions  . Codeine Nausea Only  . Disopyramide     Other reaction(s): Unknown  . Ibuprofen Diarrhea  . Iodine     blisters  . Norpace [Disopyramide Phosphate]   . Quinidine     Other reaction(s): Unknown  . Terfenadine     Other reaction(s): Unknown  . Topiramate     Other reaction(s): Other (See Comments) Hair loss  . Verapamil     Other reaction(s): Unknown    Current Outpatient Prescriptions  Medication Sig Dispense Refill  . acetaminophen (TYLENOL) 325 MG tablet Take 2 tablets (650 mg total) by mouth every 6 (six) hours as needed for mild pain (or Fever >/= 101).    . Biotin 1 MG CAPS Take 1 mg by mouth daily.    . digoxin (LANOXIN) 0.125 MG tablet Take 125 mcg by mouth at bedtime.     . ferrous sulfate 325 (65 FE) MG tablet Take 325 mg by mouth daily with breakfast.    . fluticasone (FLONASE) 50 MCG/ACT nasal spray Place 2 sprays into both nostrils daily.    Marland Kitchen gabapentin (NEURONTIN) 100 MG capsule Take 100 mg by mouth at bedtime.    Marland Kitchen glimepiride (AMARYL) 2 MG tablet Take 2 mg by  mouth 3 (three) times daily.    . magnesium oxide (MAG-OX) 400 MG tablet Take 400 mg by mouth 2 (two) times daily. Reported on 06/30/2015    . metFORMIN (GLUCOPHAGE) 500 MG tablet Take 500 mg by mouth 2 (two) times daily with a meal. Reported on 08/21/2015    . methocarbamol (ROBAXIN) 500 MG tablet Take 500 mg by mouth 4 (four) times daily.    . metoprolol succinate (TOPROL-XL) 50 MG 24 hr tablet Take 75 mg by mouth daily.    . Multiple Vitamins-Minerals (PRESERVISION AREDS 2 PO) Take 1 tablet by mouth 2 (two) times daily.    Marland Kitchen omeprazole (PRILOSEC) 20 MG capsule Take 20 mg by mouth 2 (two) times daily as needed. Reported on 08/21/2015    . penicillin v potassium (VEETID) 500 MG tablet Take 1 tablet (500 mg total) by mouth 4 (four) times daily. 40 tablet 0  . potassium chloride (K-DUR,KLOR-CON) 10 MEQ tablet Take 10 mEq by mouth 4 (four) times daily.    Marland Kitchen senna-docusate  (SENOKOT-S) 8.6-50 MG tablet Take 1 tablet by mouth 2 (two) times daily.    . silver sulfADIAZINE (SILVADENE) 1 % cream Apply to affected area daily 400 g 1  . torsemide (DEMADEX) 20 MG tablet Take 20 mg by mouth daily. 1.5 tablet     No current facility-administered medications for this visit.    Review of Systems Review of Systems  Blood pressure 112/70, pulse 78, resp. rate 14, height 5\' 3"  (1.6 m), weight 97 lb (43.999 kg). The patient's weight is stable from her last visit, up 1 pound from one month ago. Physical Exam Physical Exam  Constitutional: She is oriented to person, place, and time. She appears well-developed and well-nourished.  Cardiovascular: Normal rate, regular rhythm and normal heart sounds.   Bilateral lower leg edema present, one-2+.  No JVD, no hepatojugular reflux. No gallop.  Pulmonary/Chest: Effort normal and breath sounds normal.  Abdominal: Soft.  Musculoskeletal:       Legs: Neurological: She is alert and oriented to person, place, and time.  Skin: Skin is warm.  9 x 3 left lower leg  Psychiatric: Her behavior is normal.   Data: Laboratory studies recently completed her PCP showed mild elevation of the CO2 would otherwise normal electrolytes. Normal renal function with an estimated GFR 79. Hemoglobin A1c 7.1.  She has been checking her blood sugars 3-4 times per day. In general markedly improved with only one value greater than 200 since her last visit.  Assessment    Study improvement in left lower extremity ulcer status post incision and drainage of hematoma.    Plan    The patient will continue to apply Silvadene cream after showering. Light compressive wrap to the applied daily-every other day. Walking encouraged. Patient will refrain from standing. She may drive if she is comfortable and confident in her ability to control the car.    Home Health may decrease to once a week. Follow up in 3 weeks.  PCP:  Hyacinth Meeker,  This information has been  scribed by Dorathy Daft RN, BSN,BC.    Earline Mayotte 10/01/2015, 5:14 PM

## 2015-10-01 DIAGNOSIS — R600 Localized edema: Secondary | ICD-10-CM | POA: Insufficient documentation

## 2015-10-21 ENCOUNTER — Ambulatory Visit (INDEPENDENT_AMBULATORY_CARE_PROVIDER_SITE_OTHER): Payer: Medicare Other | Admitting: General Surgery

## 2015-10-21 ENCOUNTER — Encounter: Payer: Self-pay | Admitting: General Surgery

## 2015-10-21 VITALS — BP 130/74 | HR 77 | Resp 13 | Ht 67.0 in | Wt 97.0 lb

## 2015-10-21 DIAGNOSIS — S8012XD Contusion of left lower leg, subsequent encounter: Secondary | ICD-10-CM

## 2015-10-21 NOTE — Progress Notes (Signed)
Patient ID: Elizabeth Novak, female   DOB: Aug 10, 1923, 82 y.o.   MRN: [id number]  Chief Complaint  Patient presents with  . Routine Post Op    left leg hematoma    HPI Elizabeth Novak is a 82 y.o. female here for a follow up from a left lower leg hematoma. She reports that area is better and much smaller. She reports that bilateral leg edema is improvedWith a recent change in her diuretic therapy by her PCP. She is here today with her friend Camille Bal.  The patient reports some intermittent pain at the ulcer site, markedly improved over the last several months. I personally reviewed the patient's history. HPI  Past Medical History  Diagnosis Date  . Diabetes mellitus without complication (HCC)   . Arthritis   . A-fib (HCC)   . Presence of permanent cardiac pacemaker   . A-fib (HCC)   . CHF (congestive heart failure) (HCC)   . Mitral valve regurgitation   . Mitral valve prolapse   . Hyperlipidemia   . Breast cancer (HCC) 1978  . Dysrhythmia   . GERD (gastroesophageal reflux disease)   . Anemia   . Diverticulitis   . Macular degeneration of both eyes     Past Surgical History  Procedure Laterality Date  . Pacemaker insertion  08/11/12  . Knee arthroscopy Right   . Abdominal hysterectomy    . Esophageal dilation    . Temporal artery biopsy / ligation    . Cardiac catheterization    . Mastectomy Left 1978  . Mastectomy Right 1978  . Tonsillectomy    . Breast surgery    . Eye surgery Bilateral     Cataract Extraction with IOL  . Insert / replace / remove pacemaker    . Left oophorectomy Left   . Appendectomy    . Irrigation and debridement hematoma Left 08/21/2015    Procedure: IRRIGATION AND DEBRIDEMENT HEMATOMA;  Surgeon: Earline Mayotte, MD;  Location: [facility] ORS;  Service: General;  Laterality: Left;    Family History  Problem Relation Age of Onset  . Hypertension Mother     Social History Social History  Substance Use Topics  . Smoking status: Never  Smoker   . Smokeless tobacco: Never Used  . Alcohol Use: No    Allergies  Allergen Reactions  . Codeine Nausea Only  . Disopyramide     Other reaction(s): Unknown  . Ibuprofen Diarrhea  . Iodine     blisters  . Norpace [Disopyramide Phosphate]   . Quinidine     Other reaction(s): Unknown  . Terfenadine     Other reaction(s): Unknown  . Topiramate     Other reaction(s): Other (See Comments) Hair loss  . Verapamil     Other reaction(s): Unknown    Current Outpatient Prescriptions  Medication Sig Dispense Refill  . acetaminophen (TYLENOL) 325 MG tablet Take 2 tablets (650 mg total) by mouth every 6 (six) hours as needed for mild pain (or Fever >/= 101).    . Biotin 1 MG CAPS Take 1 mg by mouth daily.    . digoxin (LANOXIN) 0.125 MG tablet Take 125 mcg by mouth at bedtime.     . ferrous sulfate 325 (65 FE) MG tablet Take 325 mg by mouth daily with breakfast.    . fluticasone (FLONASE) 50 MCG/ACT nasal spray Place 2 sprays into both nostrils daily.    Marland Kitchen gabapentin (NEURONTIN) 100 MG capsule Take 100 mg by mouth at bedtime.    Marland Kitchen  glimepiride (AMARYL) 2 MG tablet Take 2 mg by mouth 3 (three) times daily.    . hydrochlorothiazide (HYDRODIURIL) 12.5 MG tablet Take by mouth.    . magnesium oxide (MAG-OX) 400 MG tablet Take 400 mg by mouth 2 (two) times daily. Reported on 06/30/2015    . metFORMIN (GLUCOPHAGE) 500 MG tablet Take 500 mg by mouth 2 (two) times daily with a meal. Reported on 08/21/2015    . methocarbamol (ROBAXIN) 500 MG tablet Take 500 mg by mouth 4 (four) times daily.    . metoprolol succinate (TOPROL-XL) 50 MG 24 hr tablet Take 75 mg by mouth daily.    . Multiple Vitamins-Minerals (PRESERVISION AREDS 2 PO) Take 1 tablet by mouth 2 (two) times daily.    Marland Kitchen omeprazole (PRILOSEC) 20 MG capsule Take 20 mg by mouth 2 (two) times daily as needed. Reported on 08/21/2015    . potassium chloride (K-DUR,KLOR-CON) 10 MEQ tablet Take 10 mEq by mouth 4 (four) times daily.    Marland Kitchen  senna-docusate (SENOKOT-S) 8.6-50 MG tablet Take 1 tablet by mouth 2 (two) times daily.    . silver sulfADIAZINE (SILVADENE) 1 % cream Apply to affected area daily 400 g 1  . torsemide (DEMADEX) 20 MG tablet Take 20 mg by mouth daily. 1.5 tablet     No current facility-administered medications for this visit.    Review of Systems Review of Systems  Constitutional: Negative.   Respiratory: Negative.   Cardiovascular: Negative.     Blood pressure 130/74, pulse 77, resp. rate 13, height 5\' 7"  (1.702 m), weight 97 lb (43.999 kg).  Physical Exam Physical Exam  Musculoskeletal:       Legs:     Assessment    Steady progress towards closure of left lower extremity ulcer.    Plan    Will discontinue the weekly nursing visits.  Patient to return in one month. PCP: Bethann Punches This has been scribed by Sinda Du LPN    Earline Mayotte 10/22/2015, 11:12 AM

## 2015-10-21 NOTE — Patient Instructions (Addendum)
Patient to return in one month. 

## 2015-10-22 ENCOUNTER — Telehealth: Payer: Self-pay

## 2015-10-22 NOTE — Telephone Encounter (Signed)
-----   Message from Robert Bellow, MD sent at 10/22/2015 11:15 AM EDT ----- Contact the visiting nurse service and discontinue the weekly visits. Thank you

## 2015-10-22 NOTE — Telephone Encounter (Signed)
Spoke with [place] with [facility] and discontinued weekly nurse visits.

## 2015-12-02 ENCOUNTER — Ambulatory Visit (INDEPENDENT_AMBULATORY_CARE_PROVIDER_SITE_OTHER): Payer: Medicare Other | Admitting: General Surgery

## 2015-12-02 ENCOUNTER — Encounter: Payer: Self-pay | Admitting: General Surgery

## 2015-12-02 VITALS — BP 138/80 | HR 78 | Resp 14 | Ht 63.5 in | Wt 101.2 lb

## 2015-12-02 DIAGNOSIS — S8012XD Contusion of left lower leg, subsequent encounter: Secondary | ICD-10-CM | POA: Diagnosis not present

## 2015-12-02 NOTE — Patient Instructions (Signed)
Return in eight weeks.

## 2015-12-02 NOTE — Progress Notes (Signed)
Patient ID: Elizabeth Novak, female   DOB: 02-Mar-1924, 82 y.o.   MRN: [id number]  Chief Complaint  Patient presents with  . Follow-up    left leg ulcer    HPI Elizabeth Novak is a 82 y.o. female here for a follow up for her left leg ulcer. She states that it still hurts but is healing well.  HPI  Past Medical History:  Diagnosis Date  . A-fib (HCC)   . A-fib (HCC)   . Anemia   . Arthritis   . Breast cancer (HCC) 1978  . CHF (congestive heart failure) (HCC)   . Diabetes mellitus without complication (HCC)   . Diverticulitis   . Dysrhythmia   . GERD (gastroesophageal reflux disease)   . Hyperlipidemia   . Macular degeneration of both eyes   . Mitral valve prolapse   . Mitral valve regurgitation   . Presence of permanent cardiac pacemaker     Past Surgical History:  Procedure Laterality Date  . ABDOMINAL HYSTERECTOMY    . APPENDECTOMY    . BREAST SURGERY    . CARDIAC CATHETERIZATION    . ESOPHAGEAL DILATION    . EYE SURGERY Bilateral    Cataract Extraction with IOL  . INSERT / REPLACE / REMOVE PACEMAKER    . IRRIGATION AND DEBRIDEMENT HEMATOMA Left 08/21/2015   Procedure: IRRIGATION AND DEBRIDEMENT HEMATOMA;  Surgeon: Earline Mayotte, MD;  Location: [facility] ORS;  Service: General;  Laterality: Left;  . KNEE ARTHROSCOPY Right   . LEFT OOPHORECTOMY Left   . MASTECTOMY Left 1978  . MASTECTOMY Right 1978  . PACEMAKER INSERTION  08/11/12  . TEMPORAL ARTERY BIOPSY / LIGATION    . TONSILLECTOMY      Family History  Problem Relation Age of Onset  . Hypertension Mother     Social History Social History  Substance Use Topics  . Smoking status: Never Smoker  . Smokeless tobacco: Never Used  . Alcohol use No    Allergies  Allergen Reactions  . Codeine Nausea Only  . Disopyramide     Other reaction(s): Unknown  . Ibuprofen Diarrhea  . Iodine     blisters  . Norpace [Disopyramide Phosphate]   . Quinidine     Other reaction(s): Unknown  . Terfenadine    Other reaction(s): Unknown  . Topiramate     Other reaction(s): Other (See Comments) Hair loss  . Verapamil     Other reaction(s): Unknown    Current Outpatient Prescriptions  Medication Sig Dispense Refill  . acetaminophen (TYLENOL) 325 MG tablet Take 2 tablets (650 mg total) by mouth every 6 (six) hours as needed for mild pain (or Fever >/= 101).    . Biotin 1 MG CAPS Take 1 mg by mouth daily.    . digoxin (LANOXIN) 0.125 MG tablet Take 125 mcg by mouth at bedtime.     . ferrous sulfate 325 (65 FE) MG tablet Take 325 mg by mouth daily with breakfast.    . fluticasone (FLONASE) 50 MCG/ACT nasal spray Place 2 sprays into both nostrils daily.    Marland Kitchen gabapentin (NEURONTIN) 100 MG capsule Take 100 mg by mouth at bedtime.    Marland Kitchen glimepiride (AMARYL) 2 MG tablet Take 2 mg by mouth 3 (three) times daily.    . hydrochlorothiazide (HYDRODIURIL) 12.5 MG tablet Take by mouth.    . magnesium oxide (MAG-OX) 400 MG tablet Take 400 mg by mouth 2 (two) times daily. Reported on 06/30/2015    . metFORMIN (  GLUCOPHAGE) 500 MG tablet Take 500 mg by mouth 2 (two) times daily with a meal. Reported on 08/21/2015    . methocarbamol (ROBAXIN) 500 MG tablet Take 500 mg by mouth 4 (four) times daily.    . metoprolol succinate (TOPROL-XL) 50 MG 24 hr tablet Take 75 mg by mouth daily.    . Multiple Vitamins-Minerals (PRESERVISION AREDS 2 PO) Take 1 tablet by mouth 2 (two) times daily.    Marland Kitchen omeprazole (PRILOSEC) 20 MG capsule Take 20 mg by mouth 2 (two) times daily as needed. Reported on 08/21/2015    . potassium chloride (K-DUR,KLOR-CON) 10 MEQ tablet Take 10 mEq by mouth 4 (four) times daily.    Marland Kitchen senna-docusate (SENOKOT-S) 8.6-50 MG tablet Take 1 tablet by mouth 2 (two) times daily.    . silver sulfADIAZINE (SILVADENE) 1 % cream Apply to affected area daily 400 g 1  . torsemide (DEMADEX) 20 MG tablet Take 20 mg by mouth daily. 1.5 tablet     No current facility-administered medications for this visit.     Review of  Systems Review of Systems  Constitutional: Negative.   Respiratory: Negative.   Cardiovascular: Negative.     Blood pressure 138/80, pulse 78, resp. rate 14, height 5' 3.5" (1.613 m), weight 101 lb 3.2 oz (45.9 kg).  Physical Exam Physical Exam  Musculoskeletal:       Legs:       Assessment    Study improvement in left lower extremity hematoma resolution.    Plan     The patient will continue application of Silvadene cream on a daily basis. Patient to return in eight weeks.   This has been scribed by Sinda Du LPN    Earline Mayotte 12/02/2015, 8:30 PM

## 2016-02-03 ENCOUNTER — Encounter: Payer: Self-pay | Admitting: General Surgery

## 2016-02-03 ENCOUNTER — Ambulatory Visit (INDEPENDENT_AMBULATORY_CARE_PROVIDER_SITE_OTHER): Payer: Medicare Other | Admitting: General Surgery

## 2016-02-03 VITALS — BP 128/68 | HR 70 | Resp 14 | Ht 63.0 in | Wt 101.0 lb

## 2016-02-03 DIAGNOSIS — S8012XD Contusion of left lower leg, subsequent encounter: Secondary | ICD-10-CM

## 2016-02-03 NOTE — Patient Instructions (Signed)
Return as needed

## 2016-02-03 NOTE — Progress Notes (Signed)
Patient ID: Elizabeth Novak, female   DOB: October 13, 1923, 82 y.o.   MRN: [id number]  Chief Complaint  Patient presents with  . Follow-up    leg ulcer     HPI Elizabeth Novak is a 82 y.o. female here for a follow up for her left leg ulcer. Patient states the area is doing well at this time.  HPI  Past Medical History:  Diagnosis Date  . A-fib (HCC)   . A-fib (HCC)   . Anemia   . Arthritis   . Breast cancer (HCC) 1978  . CHF (congestive heart failure) (HCC)   . Diabetes mellitus without complication (HCC)   . Diverticulitis   . Dysrhythmia   . GERD (gastroesophageal reflux disease)   . Hyperlipidemia   . Macular degeneration of both eyes   . Mitral valve prolapse   . Mitral valve regurgitation   . Presence of permanent cardiac pacemaker     Past Surgical History:  Procedure Laterality Date  . ABDOMINAL HYSTERECTOMY    . APPENDECTOMY    . BREAST SURGERY    . CARDIAC CATHETERIZATION    . ESOPHAGEAL DILATION    . EYE SURGERY Bilateral    Cataract Extraction with IOL  . INSERT / REPLACE / REMOVE PACEMAKER    . IRRIGATION AND DEBRIDEMENT HEMATOMA Left 08/21/2015   Procedure: IRRIGATION AND DEBRIDEMENT HEMATOMA;  Surgeon: Earline Mayotte, MD;  Location: [facility] ORS;  Service: General;  Laterality: Left;  . KNEE ARTHROSCOPY Right   . LEFT OOPHORECTOMY Left   . MASTECTOMY Left 1978  . MASTECTOMY Right 1978  . PACEMAKER INSERTION  08/11/12  . TEMPORAL ARTERY BIOPSY / LIGATION    . TONSILLECTOMY      Family History  Problem Relation Age of Onset  . Hypertension Mother     Social History Social History  Substance Use Topics  . Smoking status: Never Smoker  . Smokeless tobacco: Never Used  . Alcohol use No    Allergies  Allergen Reactions  . Codeine Nausea Only  . Disopyramide     Other reaction(s): Unknown  . Ibuprofen Diarrhea  . Iodine     blisters  . Norpace [Disopyramide Phosphate]   . Quinidine     Other reaction(s): Unknown  . Terfenadine     Other  reaction(s): Unknown  . Topiramate     Other reaction(s): Other (See Comments) Hair loss  . Verapamil     Other reaction(s): Unknown    Current Outpatient Prescriptions  Medication Sig Dispense Refill  . acetaminophen (TYLENOL) 325 MG tablet Take 2 tablets (650 mg total) by mouth every 6 (six) hours as needed for mild pain (or Fever >/= 101).    . Biotin 1 MG CAPS Take 1 mg by mouth daily.    . digoxin (LANOXIN) 0.125 MG tablet Take 125 mcg by mouth at bedtime.     . ferrous sulfate 325 (65 FE) MG tablet Take 325 mg by mouth daily with breakfast.    . fluticasone (FLONASE) 50 MCG/ACT nasal spray Place 2 sprays into both nostrils daily.    Marland Kitchen gabapentin (NEURONTIN) 100 MG capsule Take 100 mg by mouth at bedtime.    Marland Kitchen glimepiride (AMARYL) 2 MG tablet Take 2 mg by mouth 3 (three) times daily.    . hydrochlorothiazide (HYDRODIURIL) 12.5 MG tablet Take by mouth.    . magnesium oxide (MAG-OX) 400 MG tablet Take 400 mg by mouth 2 (two) times daily. Reported on 06/30/2015    .  metFORMIN (GLUCOPHAGE) 500 MG tablet Take 500 mg by mouth 2 (two) times daily with a meal. Reported on 08/21/2015    . methocarbamol (ROBAXIN) 500 MG tablet Take 500 mg by mouth 4 (four) times daily.    . metoprolol succinate (TOPROL-XL) 50 MG 24 hr tablet Take 75 mg by mouth daily.    . Multiple Vitamins-Minerals (PRESERVISION AREDS 2 PO) Take 1 tablet by mouth 2 (two) times daily.    Marland Kitchen omeprazole (PRILOSEC) 20 MG capsule Take 20 mg by mouth 2 (two) times daily as needed. Reported on 08/21/2015    . potassium chloride (K-DUR,KLOR-CON) 10 MEQ tablet Take 10 mEq by mouth 4 (four) times daily.    Marland Kitchen senna-docusate (SENOKOT-S) 8.6-50 MG tablet Take 1 tablet by mouth 2 (two) times daily.    . silver sulfADIAZINE (SILVADENE) 1 % cream Apply to affected area daily 400 g 1  . torsemide (DEMADEX) 20 MG tablet Take 20 mg by mouth daily. 1.5 tablet     No current facility-administered medications for this visit.     Review of  Systems Review of Systems  Constitutional: Negative.   Respiratory: Negative.   Cardiovascular: Negative.     Blood pressure 128/68, pulse 70, resp. rate 14, height 5\' 3"  (1.6 m), weight 101 lb (45.8 kg).  Physical Exam Physical Exam  Constitutional: She is oriented to person, place, and time. She appears well-developed and well-nourished.  Musculoskeletal:       Legs: Neurological: She is alert and oriented to person, place, and time.  Skin: Skin is warm and dry.    Assessment    The improvement in left lower extremity ulcer healing.    Plan    The patient will continue to apply Silvadene as needed and keep the area covered until the last remaining area has epithelialized. She'll report promptly if there is any increase in the wound.  Edema above the old injury site is secondary to the loss to be a last history of the tissue  was debrided.   Return as needed. This information has been scribed by Ples Specter CMA. \ Earline Mayotte 02/04/2016, 12:44 PM

## 2016-02-15 ENCOUNTER — Observation Stay
Admission: EM | Admit: 2016-02-15 | Discharge: 2016-02-16 | Disposition: A | Discharge status: Home / Self Care | Payer: Medicare Other | Attending: Internal Medicine | Admitting: Internal Medicine

## 2016-02-15 ENCOUNTER — Observation Stay: Payer: Medicare Other

## 2016-02-15 ENCOUNTER — Emergency Department: Payer: Medicare Other

## 2016-02-15 ENCOUNTER — Encounter: Payer: Self-pay | Admitting: Emergency Medicine

## 2016-02-15 DIAGNOSIS — Z9013 Acquired absence of bilateral breasts and nipples: Secondary | ICD-10-CM | POA: Insufficient documentation

## 2016-02-15 DIAGNOSIS — Z7982 Long term (current) use of aspirin: Secondary | ICD-10-CM | POA: Insufficient documentation

## 2016-02-15 DIAGNOSIS — E785 Hyperlipidemia, unspecified: Secondary | ICD-10-CM | POA: Diagnosis not present

## 2016-02-15 DIAGNOSIS — G459 Transient cerebral ischemic attack, unspecified: Secondary | ICD-10-CM | POA: Diagnosis not present

## 2016-02-15 DIAGNOSIS — I482 Chronic atrial fibrillation: Secondary | ICD-10-CM | POA: Insufficient documentation

## 2016-02-15 DIAGNOSIS — E119 Type 2 diabetes mellitus without complications: Secondary | ICD-10-CM | POA: Diagnosis not present

## 2016-02-15 DIAGNOSIS — H3530 Unspecified macular degeneration: Secondary | ICD-10-CM | POA: Diagnosis not present

## 2016-02-15 DIAGNOSIS — I495 Sick sinus syndrome: Secondary | ICD-10-CM | POA: Insufficient documentation

## 2016-02-15 DIAGNOSIS — Z950 Presence of cardiac pacemaker: Secondary | ICD-10-CM | POA: Insufficient documentation

## 2016-02-15 DIAGNOSIS — Z853 Personal history of malignant neoplasm of breast: Secondary | ICD-10-CM | POA: Insufficient documentation

## 2016-02-15 DIAGNOSIS — R202 Paresthesia of skin: Secondary | ICD-10-CM

## 2016-02-15 DIAGNOSIS — I509 Heart failure, unspecified: Secondary | ICD-10-CM | POA: Insufficient documentation

## 2016-02-15 DIAGNOSIS — K219 Gastro-esophageal reflux disease without esophagitis: Secondary | ICD-10-CM | POA: Insufficient documentation

## 2016-02-15 DIAGNOSIS — Z79899 Other long term (current) drug therapy: Secondary | ICD-10-CM | POA: Diagnosis not present

## 2016-02-15 DIAGNOSIS — R531 Weakness: Secondary | ICD-10-CM | POA: Diagnosis not present

## 2016-02-15 DIAGNOSIS — M6281 Muscle weakness (generalized): Secondary | ICD-10-CM

## 2016-02-15 LAB — BASIC METABOLIC PANEL
Anion gap: 8 (ref 5–15)
BUN: 21 mg/dL — ABNORMAL HIGH (ref 6–20)
CO2: 30 mmol/L (ref 22–32)
Calcium: 10 mg/dL (ref 8.9–10.3)
Chloride: 102 mmol/L (ref 101–111)
Creatinine, Ser: 0.79 mg/dL (ref 0.44–1.00)
GFR calc Af Amer: >60 mL/min (ref >60)
GFR calc non Af Amer: >60 mL/min (ref >60)
Glucose, Bld: 179 mg/dL — ABNORMAL HIGH (ref 65–99)
Potassium: 3.6 mmol/L (ref 3.5–5.1)
Sodium: 140 mmol/L (ref 135–145)

## 2016-02-15 LAB — CBC WITH DIFFERENTIAL/PLATELET
Basophils Absolute: 0 10*3/uL (ref 0–0.1)
Basophils Relative: 1 %
Eosinophils Absolute: 0.1 10*3/uL (ref 0–0.7)
Eosinophils Relative: 2 %
HCT: 38.5 % (ref 35.0–47.0)
Hemoglobin: 12.8 g/dL (ref 12.0–16.0)
Lymphocytes Relative: 24 %
Lymphs Abs: 1.2 10*3/uL (ref 1.0–3.6)
MCH: 29.5 pg (ref 26.0–34.0)
MCHC: 33.1 g/dL (ref 32.0–36.0)
MCV: 89.1 fL (ref 80.0–100.0)
Monocytes Absolute: 0.4 10*3/uL (ref 0.2–0.9)
Monocytes Relative: 9 %
Neutro Abs: 3.2 10*3/uL (ref 1.4–6.5)
Neutrophils Relative %: 64 %
Platelets: 248 10*3/uL (ref 150–440)
RBC: 4.32 MIL/uL (ref 3.80–5.20)
RDW: 13.3 % (ref 11.5–14.5)
WBC: 4.9 10*3/uL (ref 3.6–11.0)

## 2016-02-15 LAB — GLUCOSE, CAPILLARY: Glucose-Capillary: 93 mg/dL (ref 65–99)

## 2016-02-15 LAB — PROTIME-INR
INR: 1.17
Prothrombin Time: 15 seconds (ref 11.4–15.2)

## 2016-02-15 LAB — APTT: aPTT: 137 seconds — ABNORMAL HIGH (ref 24–36)

## 2016-02-15 MED ORDER — SODIUM CHLORIDE 0.9% FLUSH: 3.0000 mL | Freq: Two times a day (BID) | INTRAVENOUS | Status: DC

## 2016-02-15 MED ORDER — OCUVITE-LUTEIN PO CAPS
ORAL_CAPSULE | Freq: Two times a day (BID) | ORAL | Status: DC
  Administered 2016-02-16 (×2): 1 via ORAL
  Filled 2016-02-15 (×2): qty 1

## 2016-02-15 MED ORDER — FERROUS FUMARATE 324 (106 FE) MG PO TABS
1.0000 | ORAL_TABLET | ORAL | Status: DC
  Filled 2016-02-15: qty 1

## 2016-02-15 MED ORDER — METOPROLOL SUCCINATE ER 25 MG PO TB24
75.0000 mg | ORAL_TABLET | Freq: Every day | ORAL | Status: DC
  Administered 2016-02-16: 75 mg via ORAL
  Filled 2016-02-15 (×3): qty 1

## 2016-02-15 MED ORDER — CALCIUM CITRATE-VITAMIN D 500-400 MG-UNIT PO CHEW
1.0000 | CHEWABLE_TABLET | Freq: Two times a day (BID) | ORAL | Status: DC
  Administered 2016-02-16 (×2): 1 via ORAL
  Filled 2016-02-15 (×3): qty 1

## 2016-02-15 MED ORDER — SODIUM CHLORIDE 0.9 % IV SOLN: 250.0000 mL | INTRAVENOUS | Status: DC | PRN

## 2016-02-15 MED ORDER — SODIUM CHLORIDE 0.9% FLUSH
3.0000 mL | Freq: Two times a day (BID) | INTRAVENOUS | Status: DC
  Administered 2016-02-16: 3 mL via INTRAVENOUS

## 2016-02-15 MED ORDER — ACETAMINOPHEN 325 MG PO TABS: 650.0000 mg | ORAL_TABLET | Freq: Four times a day (QID) | ORAL | Status: DC | PRN

## 2016-02-15 MED ORDER — SODIUM CHLORIDE 0.9% FLUSH: 3.0000 mL | INTRAVENOUS | Status: DC | PRN

## 2016-02-15 MED ORDER — HEPARIN BOLUS VIA INFUSION
2250.0000 [IU] | Freq: Once | INTRAVENOUS | Status: AC
Start: 2016-02-15 — End: 2016-02-15
  Administered 2016-02-15: 2250 [IU] via INTRAVENOUS
  Filled 2016-02-15: qty 2250

## 2016-02-15 MED ORDER — HEPARIN (PORCINE) IN NACL 100-0.45 UNIT/ML-% IJ SOLN: 12.0000 [IU]/kg/h | Freq: Once | INTRAMUSCULAR | Status: DC

## 2016-02-15 MED ORDER — METFORMIN HCL 500 MG PO TABS
500.0000 mg | ORAL_TABLET | Freq: Two times a day (BID) | ORAL | Status: DC
  Administered 2016-02-16: 17:00:00 500 mg via ORAL
  Filled 2016-02-15 (×2): qty 1

## 2016-02-15 MED ORDER — GLIMEPIRIDE 2 MG PO TABS
2.0000 mg | ORAL_TABLET | Freq: Two times a day (BID) | ORAL | Status: DC
Start: 2016-02-16 — End: 2016-02-16
  Filled 2016-02-15: qty 1

## 2016-02-15 MED ORDER — POTASSIUM CHLORIDE CRYS ER 10 MEQ PO TBCR
10.0000 meq | EXTENDED_RELEASE_TABLET | Freq: Four times a day (QID) | ORAL | Status: DC
  Administered 2016-02-16 (×3): 10 meq via ORAL
  Filled 2016-02-15 (×3): qty 1

## 2016-02-15 MED ORDER — HEPARIN (PORCINE) IN NACL 100-0.45 UNIT/ML-% IJ SOLN
750.0000 [IU]/h | INTRAMUSCULAR | Status: DC
  Administered 2016-02-15: 750 [IU]/h via INTRAVENOUS
  Filled 2016-02-15: qty 250

## 2016-02-15 MED ORDER — ASPIRIN 325 MG PO TABS
325.0000 mg | ORAL_TABLET | Freq: Every day | ORAL | Status: DC
  Administered 2016-02-16: 325 mg via ORAL
  Filled 2016-02-15: qty 1

## 2016-02-15 MED ORDER — SENNOSIDES-DOCUSATE SODIUM 8.6-50 MG PO TABS
1.0000 | ORAL_TABLET | Freq: Two times a day (BID) | ORAL | Status: DC
  Administered 2016-02-16: 1 via ORAL
  Filled 2016-02-15 (×2): qty 1

## 2016-02-15 MED ORDER — HEPARIN SODIUM (PORCINE) 5000 UNIT/ML IJ SOLN: 60.0000 [IU]/kg | Freq: Once | INTRAMUSCULAR | Status: DC

## 2016-02-15 MED ORDER — DIGOXIN 125 MCG PO TABS
0.1250 mg | ORAL_TABLET | Freq: Every day | ORAL | Status: DC
  Administered 2016-02-16: 0.125 mg via ORAL
  Filled 2016-02-15 (×2): qty 1

## 2016-02-15 MED ORDER — TORSEMIDE 20 MG PO TABS
20.0000 mg | ORAL_TABLET | Freq: Every day | ORAL | Status: DC
  Administered 2016-02-16: 09:00:00 20 mg via ORAL
  Filled 2016-02-15: qty 1

## 2016-02-15 NOTE — Care Management Obs Status (Signed)
[facility] NOTIFICATION   Patient Details  Name: Elizabeth Novak MRN: [id number] Date of Birth: 1923-06-22   Medicare Observation Status Notification Given:  Yes    CrutchfieldAntony Haste, RN 02/15/2016, 6:52 PM

## 2016-02-15 NOTE — ED Notes (Signed)
ED Provider at bedside. 

## 2016-02-15 NOTE — Consult Note (Signed)
ANTICOAGULATION CONSULT NOTE - Initial Consult  Pharmacy Consult for heparin drip Indication: atrial fibrillation  Allergies  Allergen Reactions  . Codeine Nausea Only  . Disopyramide     Other reaction(s): Unknown  . Ibuprofen Diarrhea  . Iodine     blisters  . Norpace [Disopyramide Phosphate]   . Quinidine     Other reaction(s): Unknown  . Terfenadine     Other reaction(s): Unknown  . Topiramate     Other reaction(s): Other (See Comments) Hair loss  . Verapamil     Other reaction(s): Unknown    Patient Measurements: Height: 5\' 3"  (160 cm) Weight: 101 lb (45.8 kg) IBW/kg (Calculated) : 52.4 Heparin Dosing Weight: 45.8kg  Vital Signs: Temp: 97.7 F (36.5 C) (11/11 1225) Temp Source: Oral (11/11 1225) BP: 142/85 (11/11 1732) Pulse Rate: 75 (11/11 1732)  Labs:  Recent Labs  02/15/16 1227  HGB 12.8  HCT 38.5  PLT 248  CREATININE 0.79    Estimated Creatinine Clearance: 33.8 mL/min (by C-G formula based on SCr of 0.79 mg/dL).   Medical History: Past Medical History:  Diagnosis Date  . A-fib ([place])   . A-fib ([place])   . Anemia   . Arthritis   . Breast cancer ([place]) 1978  . CHF (congestive heart failure) ([place])   . Diabetes mellitus without complication ([place])   . Diverticulitis   . Dysrhythmia   . GERD (gastroesophageal reflux disease)   . Hyperlipidemia   . Macular degeneration of both eyes   . Mitral valve prolapse   . Mitral valve regurgitation   . Presence of permanent cardiac pacemaker     Medications:  Scheduled:  . heparin  2,250 Units Intravenous Once    Assessment: Pt is a 82 year old female with chronic afib, not on home anticoagulation per med rec, who presents with an epidsode of flaccid right arm paralysis. Pharmacy consulted to dose a heparin drip for afib full anticoagulation. CBC WNL, INR and APTT ordered.  Goal of Therapy:  Heparin level 0.3-0.7 units/ml Monitor platelets by anticoagulation protocol: Yes   Plan:  Give 2250  units bolus x 1 Start heparin infusion at 750 units/hr Check anti-Xa level in 8 hours and daily while on heparin Continue to monitor H&H and platelets  Andrey Mccaskill D Takeela Peil 02/15/2016,5:55 PM
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
## 2016-02-15 NOTE — ED Provider Notes (Addendum)
Patient is in no distress, evidently with an episode of flaccid right arm paralysis. She is on a baby aspirin, has chronic atrial fibrillation. I have placed her on heparin for adequate anticoagulation. I will discuss with the hospitalist for admission.  EKG: Interpreted by me, atrial fibrillation with a rate of 79 bpm, normal PR interval,  normal QT, LVH with repolarization abnormality.   Earleen Newport, MD 02/15/16 1729    Earleen Newport, MD 02/15/16 [id number]

## 2016-02-15 NOTE — ED Triage Notes (Signed)
Pt presents to ED via EMS from home c/o R arm numbness and tingling. Pt states R arm suddenly felt numb and she was unable to move it starting Thursday night. Pt is able to perform full ROM today but states movement is painful. States numbness has gradually improved. Was told by PCP to come to ED. No slurred speech, no facial droop noted. No other focal complaints at this time.
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
## 2016-02-16 ENCOUNTER — Observation Stay: Payer: Medicare Other

## 2016-02-16 DIAGNOSIS — G459 Transient cerebral ischemic attack, unspecified: Secondary | ICD-10-CM | POA: Diagnosis not present

## 2016-02-16 DIAGNOSIS — R200 Anesthesia of skin: Secondary | ICD-10-CM

## 2016-02-16 LAB — GLUCOSE, CAPILLARY
Glucose-Capillary: 232 mg/dL — ABNORMAL HIGH (ref 65–99)
Glucose-Capillary: 64 mg/dL — ABNORMAL LOW (ref 65–99)
Glucose-Capillary: 100 mg/dL — ABNORMAL HIGH (ref 65–99)
Glucose-Capillary: 105 mg/dL — ABNORMAL HIGH (ref 65–99)
Glucose-Capillary: 127 mg/dL — ABNORMAL HIGH (ref 65–99)
Glucose-Capillary: 84 mg/dL (ref 65–99)

## 2016-02-16 LAB — MAGNESIUM: Magnesium: 2 mg/dL (ref 1.7–2.4)

## 2016-02-16 LAB — HEPARIN LEVEL (UNFRACTIONATED): Heparin Unfractionated: 0.43 IU/mL (ref 0.30–0.70)

## 2016-02-16 LAB — POTASSIUM: Potassium: 3.5 mmol/L (ref 3.5–5.1)

## 2016-02-16 MED ORDER — INSULIN ASPART 100 UNIT/ML ~~LOC~~ SOLN
0.0000 [IU] | Freq: Every day | SUBCUTANEOUS | Status: DC
  Administered 2016-02-16: 2 [IU] via SUBCUTANEOUS
  Filled 2016-02-16: qty 2

## 2016-02-16 MED ORDER — ASPIRIN EC 325 MG PO TBEC: 325.0000 mg | DELAYED_RELEASE_TABLET | Freq: Every day | ORAL | Status: DC

## 2016-02-16 MED ORDER — DOCUSATE SODIUM 100 MG PO CAPS
200.0000 mg | ORAL_CAPSULE | Freq: Every day | ORAL | Status: DC
  Administered 2016-02-16: 200 mg via ORAL
  Filled 2016-02-16: qty 2

## 2016-02-16 MED ORDER — INSULIN ASPART 100 UNIT/ML ~~LOC~~ SOLN
0.0000 [IU] | Freq: Three times a day (TID) | SUBCUTANEOUS | Status: DC
  Administered 2016-02-16: 1 [IU] via SUBCUTANEOUS
  Filled 2016-02-16: qty 1
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
## 2016-02-16 NOTE — Care Management Note (Addendum)
Case Management Note  Patient Details  Name: Corayma Mccrossen MRN: [id number] Date of Birth: 06-Aug-1923  Subjective/Objective:      A referral for home health PT, RN, Aide, and OT was called to [facility] at [facility].  Benjamine Mola is the on-call nurse for [facility] today.              Action/Plan:   Expected Discharge Date:                  Expected Discharge Plan:     In-House Referral:     Discharge planning Services     Post Acute Care Choice:    Choice offered to:     DME Arranged:    DME Agency:     HH Arranged:    HH Agency:     Status of Service:     If discussed at [employer] of Stay Meetings, dates discussed:    Additional Comments:  Chyann Ambrocio A, RN 02/16/2016, 11:05 AM

## 2016-02-16 NOTE — Consult Note (Signed)
ANTICOAGULATION CONSULT NOTE - Initial Consult  Pharmacy Consult for heparin drip Indication: atrial fibrillation  Allergies  Allergen Reactions  . Codeine Nausea Only  . Disopyramide     Other reaction(s): Unknown  . Ibuprofen Diarrhea  . Iodine     blisters  . Norpace [Disopyramide Phosphate]   . Quinidine     Other reaction(s): Unknown  . Terfenadine     Other reaction(s): Unknown  . Topiramate     Other reaction(s): Other (See Comments) Hair loss  . Verapamil     Other reaction(s): Unknown    Patient Measurements: Height: 5\' 3"  (160 cm) Weight: 101 lb (45.8 kg) IBW/kg (Calculated) : 52.4 Heparin Dosing Weight: 45.8kg  Vital Signs: Temp: 97.7 F (36.5 C) (11/12 0148) Temp Source: Oral (11/12 0148) BP: 139/74 (11/12 0148) Pulse Rate: 71 (11/12 0148)  Labs:  Recent Labs  02/15/16 1227 02/15/16 1825 02/16/16 0245  HGB 12.8  --   --   HCT 38.5  --   --   PLT 248  --   --   APTT  --  137*  --   LABPROT  --  15.0  --   INR  --  1.17  --   HEPARINUNFRC  --   --  0.43  CREATININE 0.79  --   --     Estimated Creatinine Clearance: 33.8 mL/min (by C-G formula based on SCr of 0.79 mg/dL).   Medical History: Past Medical History:  Diagnosis Date  . A-fib ([place])   . A-fib ([place])   . Anemia   . Arthritis   . Breast cancer ([place]) 1978  . CHF (congestive heart failure) ([place])   . Diabetes mellitus without complication ([place])   . Diverticulitis   . Dysrhythmia   . GERD (gastroesophageal reflux disease)   . Hyperlipidemia   . Macular degeneration of both eyes   . Mitral valve prolapse   . Mitral valve regurgitation   . Presence of permanent cardiac pacemaker     Medications:  Scheduled:  . aspirin  325 mg Oral Daily  . calcium citrate-vitamin D  1 tablet Oral BID WC  . digoxin  0.125 mg Oral Daily  . docusate sodium  200 mg Oral QHS  . Ferrous Fumarate  1 tablet Oral Weekly  . insulin aspart  0-5 Units Subcutaneous QHS  . insulin aspart  0-9 Units  Subcutaneous TID WC  . metFORMIN  500 mg Oral BID WC  . metoprolol succinate  75 mg Oral Daily  . multivitamin-lutein   Oral BID  . potassium chloride  10 mEq Oral QID  . senna-docusate  1 tablet Oral BID  . sodium chloride flush  3 mL Intravenous Q12H  . torsemide  20 mg Oral Daily    Assessment: Pt is a 82 year old female with chronic afib, not on home anticoagulation per med rec, who presents with an epidsode of flaccid right arm paralysis. Pharmacy consulted to dose a heparin drip for afib full anticoagulation. CBC WNL, INR and APTT ordered.  Goal of Therapy:  Heparin level 0.3-0.7 units/ml Monitor platelets by anticoagulation protocol: Yes   Plan:  Give 2250 units bolus x 1 Start heparin infusion at 750 units/hr Check anti-Xa level in 8 hours and daily while on heparin Continue to monitor H&H and platelets  11/12 0245 HL therapeutic x 1. Continue current rate. Recheck HL in 8 hours.  Laural Benes, Pharm.D., BCPS Clinical Pharmacist 02/16/2016,3:53 AM
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
## 2016-02-16 NOTE — Progress Notes (Signed)
Pt is being discharged home with [facility]. Discharge papers given and explained to pt. Pt verbalized understanding. F/u appointments and meds reviewed with pt. Awaiting son to be transported.

## 2016-02-16 NOTE — Evaluation (Signed)
Physical Therapy Evaluation Patient Details Name: Elizabeth Novak MRN: [id number] DOB: 1923-07-25 Today's Date: 02/16/2016   History of Present Illness  82 y/o female who is normally very active.  She was cooking and abruptly lost control and sensation in R UE, the severe symptoms cleared relatively quickly, but she still has some weakness.  Clinical Impression  Overall pt did well with PT and should be able to go home, but she is considerably more limited than her typical baseline activity level.  She has some R UE weakness (but was able to do fine and gross motor acts relatively well).  Overall pt should be safe to go home with intermittent assist but encouraged her to take it a little easy and use a walker for a while until she is feeling better.  Pt did have some minimal L hip pain and weakness that does not have an obvious cause, this limited pt's ambulation confidence/tolerance.     Follow Up Recommendations Home health PT    Equipment Recommendations  None recommended by PT (encouraged her to use a walker for a while at home)    Recommendations for Other Services       Precautions / Restrictions Precautions Precautions: Fall Restrictions Weight Bearing Restrictions: No      Mobility  Bed Mobility Overal bed mobility: Independent                Transfers Overall transfer level: Independent Equipment used: Straight cane             General transfer comment: Pt able to rise and maintain balance without issue  Ambulation/Gait Ambulation/Gait assistance: Min guard Ambulation Distance (Feet): 100 Feet Assistive device: Straight cane     Gait velocity interpretation: <1.8 ft/sec, indicative of risk for recurrent falls General Gait Details: Pt with mild limp on L hip and is reliant on [facility] for stability.  She reports felling very different from her baseline.  She doesn't have any LOBs, but is cautious and slow.    Stairs            Wheelchair  Mobility    Modified Rankin (Stroke Patients Only)       Balance Overall balance assessment: Modified Independent                                           Pertinent Vitals/Pain Pain Assessment: No/denies pain (some mild L hip pain/discomfort with ambulation)    Home Living Family/patient expects to be discharged to:: Private residence Living Arrangements: Alone Available Help at Discharge: Family   Home Access: Stairs to enter Entrance Stairs-Rails: Right Entrance Stairs-Number of Steps: 3 Home Layout: One level Home Equipment: Environmental consultant - 2 wheels;Walker - 4 wheels;Cane - single point;Shower seat      Prior Function Level of Independence: Independent with assistive device(s)         Comments: Pt reported that she mostly uses SPC; however, when she gets tired she will occasionally use the rollator.  Pt drives, runs errands, is generally active     Hand Dominance   Dominant Hand: Right    Extremity/Trunk Assessment   Upper Extremity Assessment: Generalized weakness;Overall WFL for tasks assessed (R UE grossly 3+ to 4-/5, age appropriate weakness on L)           Lower Extremity Assessment: Overall WFL for tasks assessed;Generalized weakness (minimally decreased L hip AB/AD  and flexion, R WNL)         Communication   Communication: No difficulties  Cognition Arousal/Alertness: Awake/alert Behavior During Therapy: WFL for tasks assessed/performed Overall Cognitive Status: Within Functional Limits for tasks assessed                      General Comments General comments (skin integrity, edema, etc.): Pt able to use R UE to don socks and knee brace as well as tie bathrobe belt, but does c/o weakness w/ only minimal coordination deficits.    Exercises     Assessment/Plan    PT Assessment Patient needs continued PT services  PT Problem List Decreased strength;Decreased activity tolerance;Decreased coordination;Decreased knowledge  of use of DME;Decreased safety awareness          PT Treatment Interventions DME instruction;Gait training;Stair training;Functional mobility training;Therapeutic activities;Therapeutic exercise;Balance training;Neuromuscular re-education    PT Goals (Current goals can be found in the Care Plan section)  Acute Rehab PT Goals Patient Stated Goal: figure out what happened PT Goal Formulation: With patient Time For Goal Achievement: 03/01/16 Potential to Achieve Goals: Good    Frequency Min 2X/week   Barriers to discharge        Co-evaluation               End of Session Equipment Utilized During Treatment: Gait belt Activity Tolerance: Patient limited by fatigue (reports getting more more tired with limited ambulation ) Patient left: with bed alarm set;with call bell/phone within reach Nurse Communication: Mobility status    Functional Assessment Tool Used: clinical judgement Functional Limitation: Mobility: Walking and moving around Mobility: Walking and Moving Around Current Status (Y8657): At least 20 percent but less than 40 percent impaired, limited or restricted Mobility: Walking and Moving Around Goal Status [phone]): At least 1 percent but less than 20 percent impaired, limited or restricted    Time: 0920-0948 PT Time Calculation (min) (ACUTE ONLY): 28 min   Charges:   PT Evaluation $PT Eval Low Complexity: 1 Procedure     PT G Codes:   PT G-Codes **NOT FOR INPATIENT CLASS** Functional Assessment Tool Used: clinical judgement Functional Limitation: Mobility: Walking and moving around Mobility: Walking and Moving Around Current Status (E9528): At least 20 percent but less than 40 percent impaired, limited or restricted Mobility: Walking and Moving Around Goal Status [phone]): At least 1 percent but less than 20 percent impaired, limited or restricted    Malachi Pro, DPT 02/16/2016, 11:19 AM

## 2016-02-16 NOTE — Plan of Care (Addendum)
Problem: Education: Goal: Knowledge of  General Education information/materials will improve Outcome: Progressing Pt oriented to unit, including call bell, diet order.  VSS, free of falls during shift.  Pt ambulated to BR multiple times during shift, independent after set-up (IV pole).  Pt transported to XR dept for c-spine XR in wheelchair.  Denies pain, nausea.  Reports RUE numbness resolving.  Discussed rationale for SSI vs. PO anti-diabetics, pt expressed understanding.  Requested to use home med supply while hospitalized, RN informed pt hospital policy requires administration of hospital formulary meds, despite MD order ok to use home meds.  Home meds stored in pharmacy, pt aware.  Pt called out approximately 0620, reported R hand tingling, R arm weakness worsening.  Also stated her head felt heavy, BG 64, pt drank 4 oz. orange juice, recheck BG 100.  Pt reports feeling better s/p orange juice.  No other needs overnight.  Bed in low position, bed alarm on.  Call bell within reach, [place].

## 2016-02-17 ENCOUNTER — Other Ambulatory Visit: Payer: Self-pay | Admitting: Internal Medicine

## 2016-02-17 DIAGNOSIS — I6523 Occlusion and stenosis of bilateral carotid arteries: Secondary | ICD-10-CM

## 2016-02-25 ENCOUNTER — Ambulatory Visit
Admission: RE | Admit: 2016-02-25 | Discharge: 2016-02-25 | Disposition: A | Discharge status: Home / Self Care | Payer: Medicare Other | Source: Ambulatory Visit | Attending: Internal Medicine | Admitting: Internal Medicine

## 2016-02-25 DIAGNOSIS — I700 Atherosclerosis of aorta: Secondary | ICD-10-CM | POA: Insufficient documentation

## 2016-02-25 DIAGNOSIS — E041 Nontoxic single thyroid nodule: Secondary | ICD-10-CM | POA: Insufficient documentation

## 2016-02-25 DIAGNOSIS — Z950 Presence of cardiac pacemaker: Secondary | ICD-10-CM | POA: Insufficient documentation

## 2016-02-25 DIAGNOSIS — D1809 Hemangioma of other sites: Secondary | ICD-10-CM | POA: Insufficient documentation

## 2016-02-25 DIAGNOSIS — I6523 Occlusion and stenosis of bilateral carotid arteries: Secondary | ICD-10-CM

## 2016-02-25 MED ORDER — IOPAMIDOL (ISOVUE-370) INJECTION 76%
75.0000 mL | Freq: Once | INTRAVENOUS | Status: AC | PRN
  Administered 2016-02-25: 75 mL via INTRAVENOUS

## 2016-06-15 ENCOUNTER — Ambulatory Visit: Payer: Medicare Other | Admitting: General Surgery

## 2016-06-17 ENCOUNTER — Ambulatory Visit (INDEPENDENT_AMBULATORY_CARE_PROVIDER_SITE_OTHER): Payer: Medicare Other | Admitting: General Surgery

## 2016-06-17 ENCOUNTER — Encounter: Payer: Self-pay | Admitting: General Surgery

## 2016-06-17 VITALS — BP 120/80 | HR 68 | Resp 12 | Ht 63.0 in | Wt 107.0 lb

## 2016-06-17 DIAGNOSIS — S7002XA Contusion of left hip, initial encounter: Secondary | ICD-10-CM

## 2016-06-17 NOTE — Progress Notes (Signed)
Patient ID: Elizabeth Novak, female   DOB: [birth year], 83 y.o.   MRN: [id number]  Chief Complaint  Patient presents with  . Mass    HPI Elizabeth Novak is a 83 y.o. female here today for a evaluation of a mass on her left hip. Patient states she tried to seat in a chair on 05/13/2016 and hit the floor. The area on her left thigh is still very swollen. Patient states her toes stay numb. Niece, Fausto Skillern id present at visit.  A photograph obtained 1 week after the injury showed ecchymosis extending down into the distal thigh.  The patient reports that the mass develop promptly after the fall, with bruising developing several days later. The chronic anticoagulation was held for 1 week and has been restarted. Last pro time INR with her PCP was normal at 1.0 HPI  Past Medical History:  Diagnosis Date  . A-fib (HCC)   . A-fib (HCC)   . Anemia   . Arthritis   . Breast cancer (HCC) 1978  . CHF (congestive heart failure) (HCC)   . Diabetes mellitus without complication (HCC)   . Diverticulitis   . Dysrhythmia   . GERD (gastroesophageal reflux disease)   . Hyperlipidemia   . Macular degeneration of both eyes   . Mitral valve prolapse   . Mitral valve regurgitation   . Presence of permanent cardiac pacemaker     Past Surgical History:  Procedure Laterality Date  . ABDOMINAL HYSTERECTOMY    . APPENDECTOMY    . BREAST SURGERY    . CARDIAC CATHETERIZATION    . ESOPHAGEAL DILATION    . EYE SURGERY Bilateral    Cataract Extraction with IOL  . INSERT / REPLACE / REMOVE PACEMAKER    . IRRIGATION AND DEBRIDEMENT HEMATOMA Left 08/21/2015   Procedure: IRRIGATION AND DEBRIDEMENT HEMATOMA;  Surgeon: Earline Mayotte, MD;  Location: [facility] ORS;  Service: General;  Laterality: Left;  . KNEE ARTHROSCOPY Right   . LEFT OOPHORECTOMY Left   . MASTECTOMY Left 1978  . MASTECTOMY Right 1978  . PACEMAKER INSERTION  08/11/12  . TEMPORAL ARTERY BIOPSY / LIGATION    . TONSILLECTOMY      Family  History  Problem Relation Age of Onset  . Hypertension Mother     Social History Social History  Substance Use Topics  . Smoking status: Never Smoker  . Smokeless tobacco: Never Used  . Alcohol use No    Allergies  Allergen Reactions  . Codeine Nausea Only  . Disopyramide     Other reaction(s): Unknown  . Ibuprofen Diarrhea  . Iodine     blisters  . Norpace [Disopyramide Phosphate]   . Quinidine     Other reaction(s): Unknown  . Terfenadine     Other reaction(s): Unknown  . Topiramate     Other reaction(s): Other (See Comments) Hair loss  . Verapamil     Other reaction(s): Unknown    Current Outpatient Prescriptions  Medication Sig Dispense Refill  . acetaminophen (TYLENOL) 325 MG tablet Take 2 tablets (650 mg total) by mouth every 6 (six) hours as needed for mild pain (or Fever >/= 101).    . calcium citrate-vitamin D (CITRACAL+D) 315-200 MG-UNIT tablet Take 1 tablet by mouth 2 (two) times daily. Take 1 tablet by mouth 2 times a day with meals.    . digoxin (LANOXIN) 0.125 MG tablet Take 0.125 mg by mouth daily.     Marland Kitchen glimepiride (AMARYL) 2 MG tablet Take  2 mg by mouth 2 (two) times daily.     . Iron-Vitamin C (VITRON-C) 65-125 MG TABS Take 1 tablet by mouth once a week.    . Menthol-Zinc Oxide (CALMOSEPTINE) 0.44-20.6 % OINT Apply topically.    . metFORMIN (GLUCOPHAGE) 500 MG tablet Take 500 mg by mouth 2 (two) times daily with a meal. Reported on 08/21/2015    . metoprolol succinate (TOPROL-XL) 50 MG 24 hr tablet Take 75 mg by mouth daily.    . Multiple Vitamins-Minerals (PRESERVISION AREDS 2 PO) Take 1 tablet by mouth 2 (two) times daily.    . potassium chloride (K-DUR,KLOR-CON) 10 MEQ tablet Take 10 mEq by mouth 4 (four) times daily.    Marland Kitchen senna-docusate (SENOKOT-S) 8.6-50 MG tablet Take 3 tablets by mouth at bedtime.     . torsemide (DEMADEX) 20 MG tablet Take 20 mg by mouth daily. Take 1.5 tablet every day as directed.    . warfarin (COUMADIN) 1 MG tablet Take 1 mg  by mouth daily at 6 PM. TAKE ONE (1) TABLET BY MOUTH DAILY ALTERNATING WITH ONE AND ONE-HALF (1+1/2) TABLET ON OPPOSITE DAYS,     No current facility-administered medications for this visit.     Review of Systems Review of Systems  Blood pressure 120/80, pulse 68, resp. rate 12, height 5\' 3"  (1.6 m), weight 107 lb (48.5 kg).  Physical Exam Physical Exam  Constitutional: She is oriented to person, place, and time. She appears well-developed and well-nourished.  Cardiovascular: Normal rate and normal heart sounds.   Pulmonary/Chest: Effort normal and breath sounds normal.  Musculoskeletal:       Legs: Neurological: She is alert and oriented to person, place, and time.  Skin: Skin is warm and dry.       Data Reviewed Most recent CBC showed a hemoglobin of 12.4 with a MCV of 88. This was completed on 04/20/2016.  Assessment    Left thigh soft tissue hematoma, minimally symptomatic.    Plan        Discussed incision and drainage or using local heat for gradual resolution. At this time, the patient is not having enough symptoms for her to warrant surgical excision. She is aware of this changes she should call for early reassessment. With the reinstitution of anticoagulation expect little risk for further size increase of the hematoma, but she was advised to call this occurs.  She was concerned about the possibility of a blood clot moving. She was reassured that this blood is outside the vascular system and has no risk in this regard.  Patient to call in one month and report.     This information has been scribed by Ples Specter CMA.    Earline Mayotte 06/17/2016, 8:55 PM

## 2016-06-17 NOTE — Patient Instructions (Addendum)
Patient to call in one month and report. The patient is aware to use a heating pad as needed for comfort

## 2016-07-20 ENCOUNTER — Telehealth: Payer: Self-pay

## 2016-07-20 NOTE — Telephone Encounter (Signed)
Patient called and states that the hematoma on her hip is getting better. It is getting smaller. She reports some discomfort with the area but not bad. He therapist has been using pressure tape on the area and she has been using heat also.

## 2016-08-21 ENCOUNTER — Other Ambulatory Visit: Payer: Self-pay | Admitting: Physician Assistant

## 2016-08-21 ENCOUNTER — Ambulatory Visit
Admission: RE | Admit: 2016-08-21 | Discharge: 2016-08-21 | Disposition: A | Discharge status: Home / Self Care | Payer: Medicare Other | Source: Ambulatory Visit | Attending: Physician Assistant | Admitting: Physician Assistant

## 2016-08-21 DIAGNOSIS — M7989 Other specified soft tissue disorders: Secondary | ICD-10-CM | POA: Diagnosis present

## 2017-05-18 DIAGNOSIS — Z0000 Encounter for general adult medical examination without abnormal findings: Secondary | ICD-10-CM | POA: Insufficient documentation

## 2017-05-31 IMAGING — US US EXTREM LOW VENOUS*L*
1 series · 13 of 24 positions shown · non-contrast
Comparison: None.

CLINICAL DATA: Left lower extremity edema for 3 days

EXAM:
LEFT LOWER EXTREMITY VENOUS DUPLEX ULTRASOUND
TECHNIQUE: Gray-scale sonography with graded compression, as well as color
Doppler and duplex ultrasound were performed to evaluate the left
lower extremity deep venous systems

[Series 1: us extrem low venous*left* · 0.08mm/px · 13 of 40 slices shown]
[im 1/40]
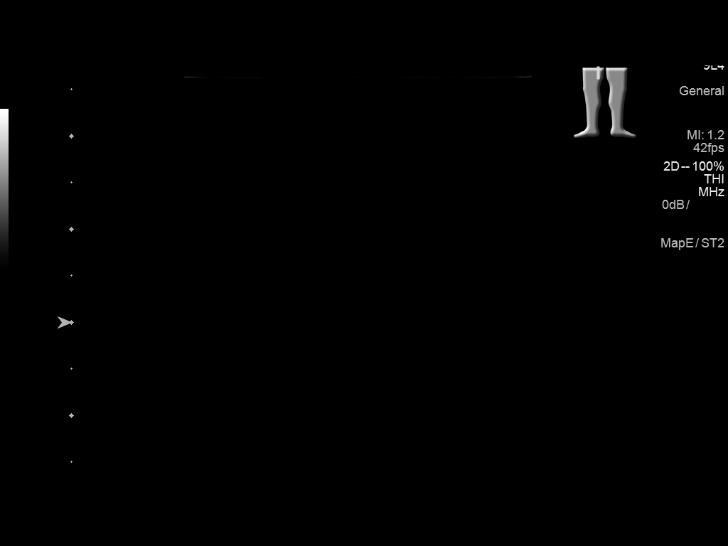
[im 4/40]
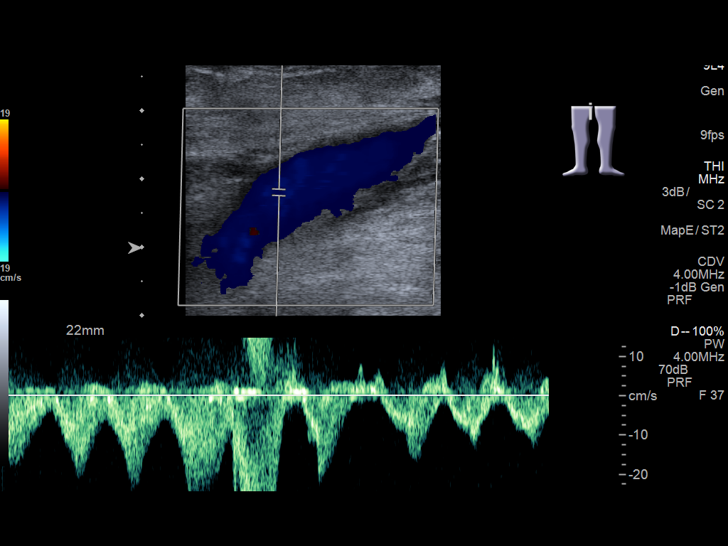
[im 7/40]
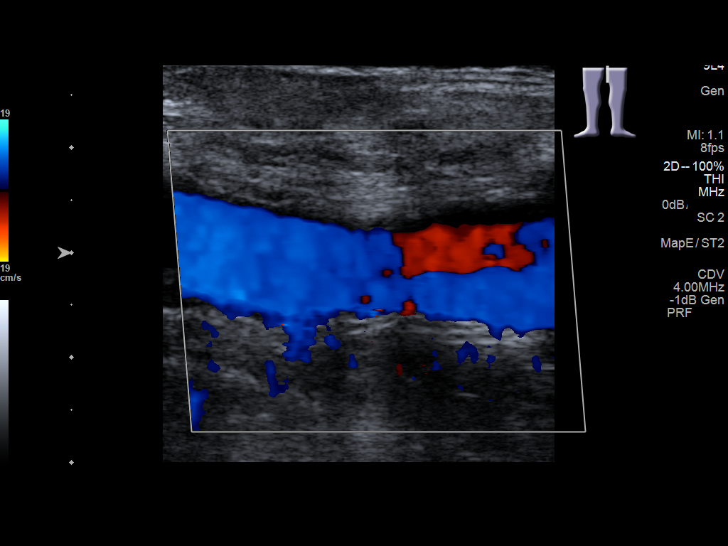
[im 11/40]
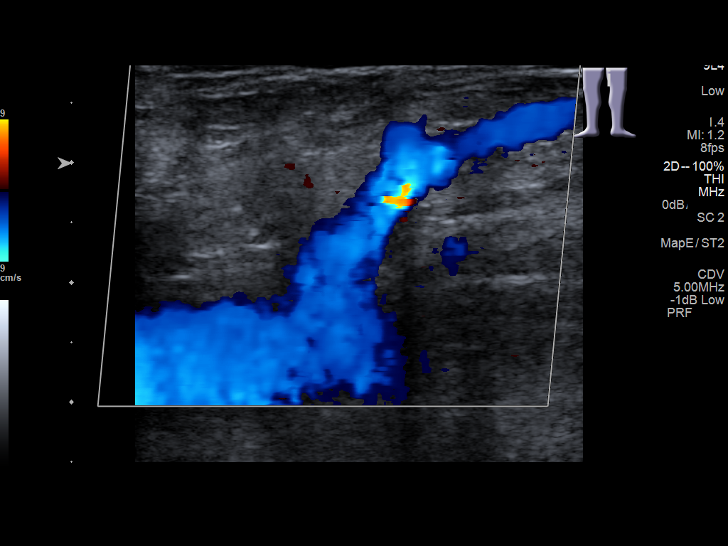
[im 14/40]
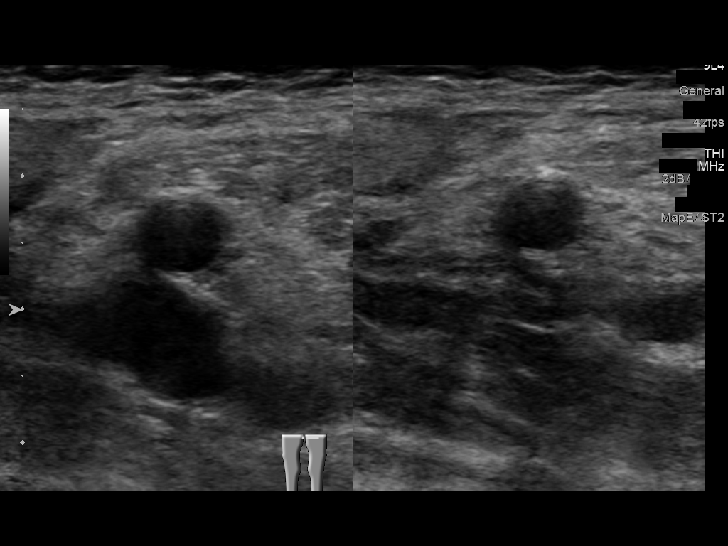
[im 17/40]
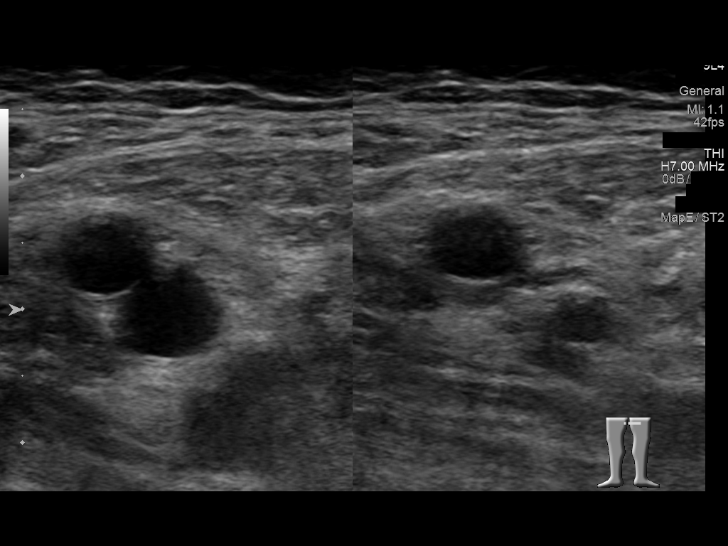
[im 21/40]
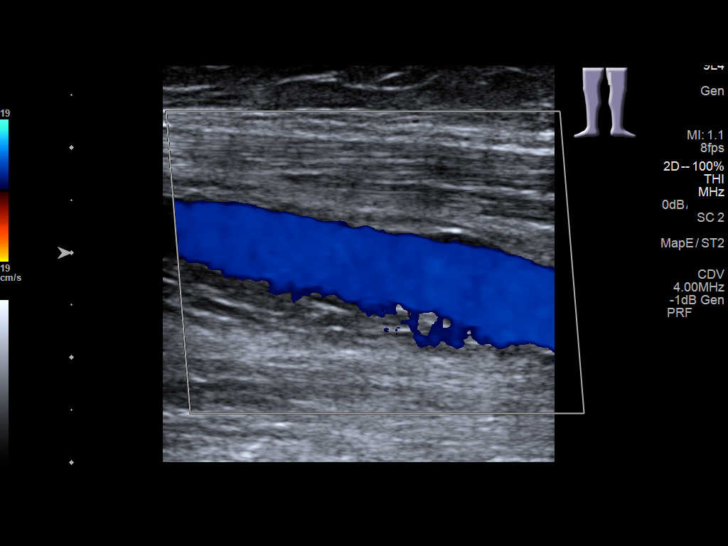
[im 23/40]
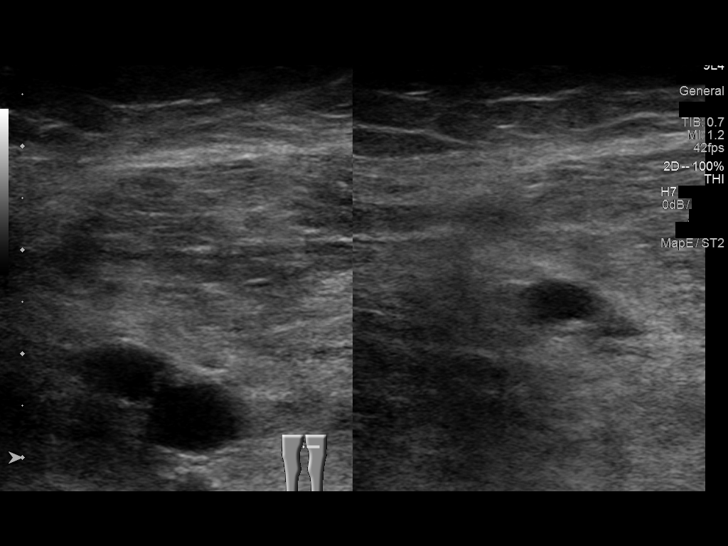
[im 26/40]
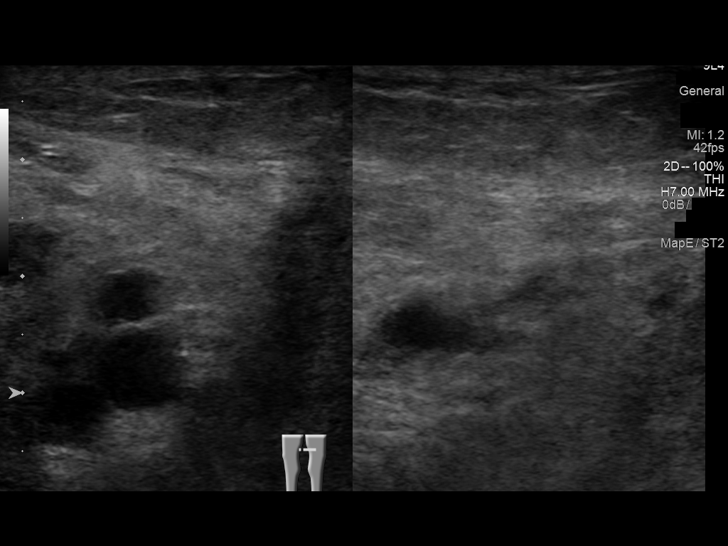
[im 29/40]
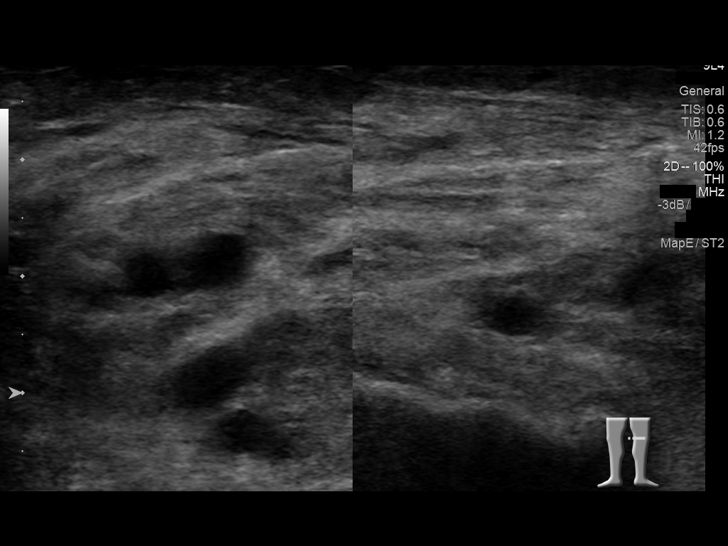
[im 33/40]
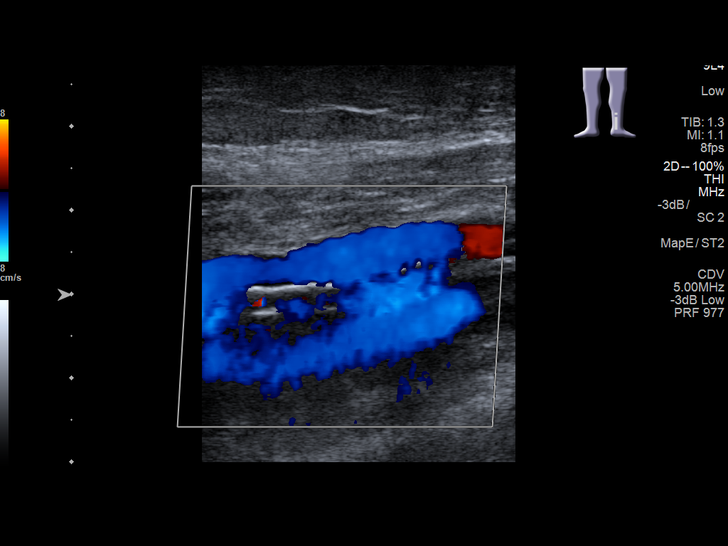
[im 36/40]
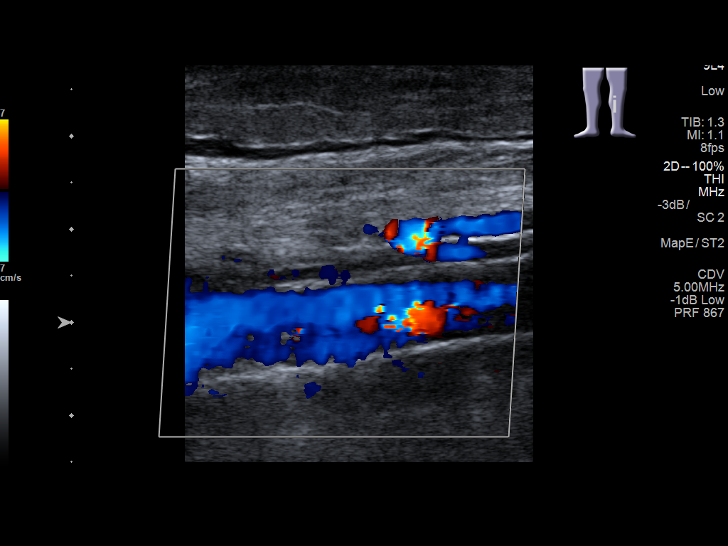
[im 40/40]
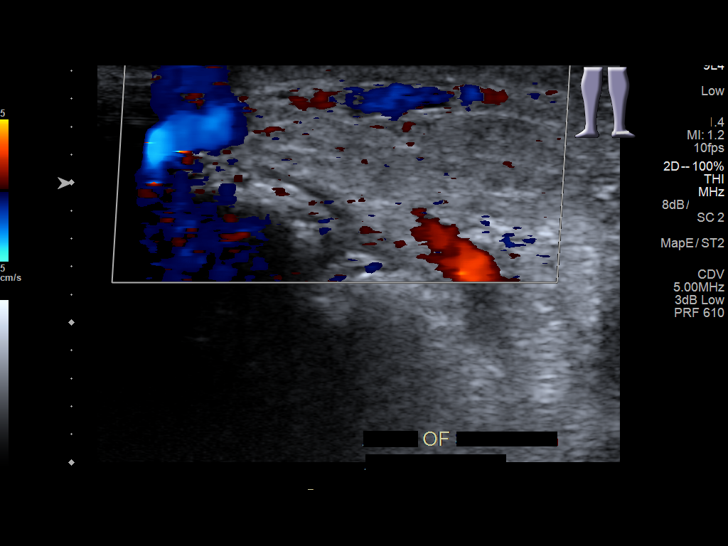

[13 of 24 positions shown; findings below may reference images not displayed]

From the level of the common femoral vein and including the common
femoral, femoral, profunda femoral, popliteal and calf veins
including the posterior tibial, peroneal and gastrocnemius veins
when visible. The superficial great saphenous vein was also
interrogated. Spectral Doppler was utilized to evaluate flow at rest
and with distal augmentation maneuvers in the common femoral,
femoral and popliteal veins.
FINDINGS: Contralateral Common Femoral Vein: Respiratory phasicity is normal
and symmetric with the symptomatic side. No evidence of thrombus.
Normal compressibility.

Common Femoral Vein: No evidence of thrombus. Normal
compressibility, respiratory phasicity and response to augmentation.

Saphenofemoral Junction: No evidence of thrombus. Normal
compressibility and flow on color Doppler imaging.

Profunda Femoral Vein: No evidence of thrombus. Normal
compressibility and flow on color Doppler imaging.

Femoral Vein: No evidence of thrombus. Normal compressibility,
respiratory phasicity and response to augmentation.

Popliteal Vein: No evidence of thrombus. Normal compressibility,
respiratory phasicity and response to augmentation.

Calf Veins: No evidence of thrombus. Normal compressibility and flow
on color Doppler imaging.

Superficial Great Saphenous Vein: No evidence of thrombus. Normal
compressibility and flow on color Doppler imaging.

Venous Reflux:  None.

Other Findings:  None.
IMPRESSION: No evidence of left lower extremity deep venous thrombosis. Right
common femoral vein also patent.

## 2017-07-28 DIAGNOSIS — I38 Endocarditis, valve unspecified: Secondary | ICD-10-CM | POA: Insufficient documentation

## 2017-07-28 DIAGNOSIS — I2781 Cor pulmonale (chronic): Secondary | ICD-10-CM | POA: Insufficient documentation

## 2017-07-28 DIAGNOSIS — I509 Heart failure, unspecified: Secondary | ICD-10-CM | POA: Insufficient documentation

## 2017-11-03 ENCOUNTER — Encounter: Admission: RE | Payer: Self-pay | Source: Ambulatory Visit

## 2017-11-03 ENCOUNTER — Ambulatory Visit
Admission: RE | Admit: 2017-11-03 | Payer: Medicare Other | Source: Ambulatory Visit | Admitting: Unknown Physician Specialty

## 2017-11-03 SURGERY — ESOPHAGOGASTRODUODENOSCOPY (EGD) WITH PROPOFOL
Anesthesia: General

## 2017-11-04 ENCOUNTER — Encounter
Admission: RE | Admit: 2017-11-04 | Discharge: 2017-11-04 | Disposition: A | Discharge status: Home / Self Care | Payer: Medicare Other | Source: Ambulatory Visit | Attending: Internal Medicine | Admitting: Internal Medicine

## 2017-11-06 ENCOUNTER — Other Ambulatory Visit
Admission: RE | Admit: 2017-11-06 | Discharge: 2017-11-06 | Disposition: A | Discharge status: Home / Self Care | Payer: Medicare Other | Source: Skilled Nursing Facility | Attending: Internal Medicine | Admitting: Internal Medicine

## 2017-11-06 DIAGNOSIS — I4891 Unspecified atrial fibrillation: Secondary | ICD-10-CM | POA: Diagnosis present

## 2017-11-06 LAB — PROTIME-INR
INR: 1.13
Prothrombin Time: 14.4 seconds (ref 11.4–15.2)

## 2017-11-10 ENCOUNTER — Encounter: Payer: Self-pay | Admitting: Adult Health

## 2017-11-10 ENCOUNTER — Non-Acute Institutional Stay (SKILLED_NURSING_FACILITY): Payer: Medicare Other | Admitting: Adult Health

## 2017-11-10 ENCOUNTER — Ambulatory Visit
Admission: RE | Admit: 2017-11-10 | Payer: Medicare Other | Source: Ambulatory Visit | Admitting: Unknown Physician Specialty

## 2017-11-10 ENCOUNTER — Other Ambulatory Visit
Admission: RE | Admit: 2017-11-10 | Discharge: 2017-11-10 | Disposition: A | Discharge status: Home / Self Care | Payer: Medicare Other | Source: Ambulatory Visit | Attending: Internal Medicine | Admitting: Internal Medicine

## 2017-11-10 ENCOUNTER — Encounter: Admission: RE | Payer: Self-pay | Source: Ambulatory Visit

## 2017-11-10 DIAGNOSIS — E119 Type 2 diabetes mellitus without complications: Secondary | ICD-10-CM | POA: Diagnosis not present

## 2017-11-10 DIAGNOSIS — I480 Paroxysmal atrial fibrillation: Secondary | ICD-10-CM

## 2017-11-10 DIAGNOSIS — K5901 Slow transit constipation: Secondary | ICD-10-CM

## 2017-11-10 DIAGNOSIS — Z7901 Long term (current) use of anticoagulants: Secondary | ICD-10-CM

## 2017-11-10 DIAGNOSIS — I481 Persistent atrial fibrillation: Secondary | ICD-10-CM | POA: Diagnosis present

## 2017-11-10 DIAGNOSIS — I509 Heart failure, unspecified: Secondary | ICD-10-CM

## 2017-11-10 LAB — CBC WITH DIFFERENTIAL/PLATELET
Basophils Absolute: 0 10*3/uL (ref 0–0.1)
Basophils Relative: 1 %
Eosinophils Absolute: 0.1 10*3/uL (ref 0–0.7)
Eosinophils Relative: 3 %
HCT: 33.9 % — ABNORMAL LOW (ref 35.0–47.0)
Hemoglobin: 11.4 g/dL — ABNORMAL LOW (ref 12.0–16.0)
Lymphocytes Relative: 29 %
Lymphs Abs: 1.1 10*3/uL (ref 1.0–3.6)
MCH: 30.5 pg (ref 26.0–34.0)
MCHC: 33.6 g/dL (ref 32.0–36.0)
MCV: 90.8 fL (ref 80.0–100.0)
Monocytes Absolute: 0.5 10*3/uL (ref 0.2–0.9)
Monocytes Relative: 12 %
Neutro Abs: 2.1 10*3/uL (ref 1.4–6.5)
Neutrophils Relative %: 55 %
Platelets: 248 10*3/uL (ref 150–440)
RBC: 3.73 MIL/uL — ABNORMAL LOW (ref 3.80–5.20)
RDW: 15.6 % — ABNORMAL HIGH (ref 11.5–14.5)
WBC: 3.8 10*3/uL (ref 3.6–11.0)

## 2017-11-10 LAB — BRAIN NATRIURETIC PEPTIDE: B Natriuretic Peptide: 186 pg/mL — ABNORMAL HIGH (ref 0.0–100.0)

## 2017-11-10 LAB — PROTIME-INR
INR: 1.09
Prothrombin Time: 14 seconds (ref 11.4–15.2)

## 2017-11-10 SURGERY — ESOPHAGOGASTRODUODENOSCOPY (EGD) WITH PROPOFOL
Anesthesia: General

## 2017-11-10 NOTE — Progress Notes (Signed)
[facility] Room Number: 349P Place of Service:  SNF ([id number]) Provider:  Kenard Gower, NP  Patient Care Team: Danella Penton, MD as PCP - General (Internal Medicine) Danella Penton, MD (Internal Medicine) Lemar Livings, Merrily Pew, MD (General Surgery)  Extended Emergency Contact Information Primary Emergency Contact: Dixon,Karen Address: [address]          [place], [place] [address] [place] Home Phone: [phone] Mobile Phone: [phone] Relation: Niece Secondary Emergency Contact: Fogleman,[name] Address: [address], [place] [address] [place] of [place] Home Phone: [phone] Mobile Phone: [phone] Relation: Niece  Code Status:  FULL  Goals of care: Advanced Directive information Advanced Directives 11/10/2017  Does Patient Have a Medical Advance Directive? Yes  Type of Advance Directive Healthcare Power of Attorney  Does patient want to make changes to medical advance directive? No - Patient declined  Copy of Healthcare Power of Attorney in Chart? Yes  Would patient like information on creating a medical advance directive? -     Chief Complaint  Patient presents with  . Acute Visit    Long term use of anticoagulants, bloating and constipation, and congestive heart failure     HPI:  Pt is a 84 y.o. female seen today for medical management of chronic illnesses. She is a short-term rehabilitation resident of [facility].  She has a PMH of atrial fibrillation, heart failure, hypokalemia, breast cancer, and MVP/MVR. She was seen in the room today. She complained of bloating/constipation. KUB result showed moderate to large quantity of fecal material present in the sigmoid and rectal region.  She has been admitted to [facility] on 11/04/17 from a recent hospitalization due to right arm numbness.  It was thought that numbness is due to cervical degeneration as seen on x-ray.  MRI was unable to  be done due to history of pacemaker.  She was evaluated by neurology and was referred for outpatient neurosurgery.   Past Medical History:  Diagnosis Date  . A-fib (HCC)   . Anemia   . Arthritis   . Breast cancer (HCC) 1978  . CHF (congestive heart failure) (HCC)   . Diabetes mellitus without complication (HCC)   . Diverticulitis   . Dysrhythmia   . GERD (gastroesophageal reflux disease)   . Hyperlipidemia   . Macular degeneration of both eyes   . Mitral valve prolapse   . Mitral valve regurgitation   . Presence of permanent cardiac pacemaker    Past Surgical History:  Procedure Laterality Date  . ABDOMINAL HYSTERECTOMY    . APPENDECTOMY    . BREAST SURGERY    . CARDIAC CATHETERIZATION    . ESOPHAGEAL DILATION    . EYE SURGERY Bilateral    Cataract Extraction with IOL  . INSERT / REPLACE / REMOVE PACEMAKER    . IRRIGATION AND DEBRIDEMENT HEMATOMA Left 08/21/2015   Procedure: IRRIGATION AND DEBRIDEMENT HEMATOMA;  Surgeon: Earline Mayotte, MD;  Location: [facility] ORS;  Service: General;  Laterality: Left;  . KNEE ARTHROSCOPY Right   . LEFT OOPHORECTOMY Left   . MASTECTOMY Left 1978  . MASTECTOMY Right 1978  . PACEMAKER INSERTION  08/11/12  . TEMPORAL ARTERY BIOPSY / LIGATION    . TONSILLECTOMY      Allergies  Allergen Reactions  . Codeine Nausea Only  . Disopyramide     Other reaction(s): Unknown  . Ibuprofen Diarrhea  . Iodine  blisters  . Norpace [Disopyramide Phosphate]   . Quinidine     Other reaction(s): Unknown  . Terfenadine     Other reaction(s): Unknown  . Topiramate     Other reaction(s): Other (See Comments) Hair loss  . Verapamil     Other reaction(s): Unknown    Outpatient Encounter Medications as of 11/10/2017  Medication Sig  . acetaminophen (TYLENOL) 325 MG tablet Take 2 tablets (650 mg total) by mouth every 6 (six) hours as needed for mild pain (or Fever >/= 101).  Marland Kitchen alum & mag hydroxide-simeth (MAALOX PLUS) 400-400-40 MG/5ML suspension  Take 30 mLs by mouth every 4 (four) hours as needed.  . digoxin (LANOXIN) 0.125 MG tablet Take 0.125 mg by mouth daily. CHF- * Hold for HR < 60  . glimepiride (AMARYL) 1 MG tablet Take 1 mg by mouth 2 (two) times daily.  . magnesium hydroxide (MILK OF MAGNESIA) 400 MG/5ML suspension Take 30 mLs by mouth every 4 (four) hours as needed. Constipation/ no BM for 2 days  . metolazone (ZAROXOLYN) 2.5 MG tablet Take 1.25 mg by mouth once a week. 1/2 tab   On Wednesday  . metoprolol succinate (TOPROL-XL) 50 MG 24 hr tablet Take 75 mg by mouth daily.   . Multiple Vitamins-Minerals (PRESERVISION AREDS 2 PO) Take 1 tablet by mouth 2 (two) times daily.  . pantoprazole (PROTONIX) 40 MG tablet Take 40 mg by mouth daily.  . polyethylene glycol (MIRALAX / GLYCOLAX) packet Take 17 g by mouth 2 (two) times daily.  . polyvinyl alcohol (ARTIFICIAL TEARS) 1.4 % ophthalmic solution Place 1 drop into both eyes daily.  . potassium chloride (K-DUR,KLOR-CON) 10 MEQ tablet Take 10 mEq by mouth 3 (three) times daily.   Marland Kitchen senna-docusate (SENOKOT-S) 8.6-50 MG tablet Take 2 tablets by mouth 2 (two) times daily.   . sitaGLIPtin (JANUVIA) 50 MG tablet Take 50 mg by mouth daily.  Marland Kitchen torsemide (DEMADEX) 20 MG tablet Take 20 mg by mouth daily. Give if 3 lbs increase in one day or 5 lbs in one week  . UNABLE TO FIND Diet Type: NAS/NCS  . warfarin (COUMADIN) 1 MG tablet Take 1.5 mg by mouth daily at 6 PM.   . [DISCONTINUED] calcium citrate-vitamin D (CITRACAL+D) 315-200 MG-UNIT tablet Take 1 tablet by mouth 2 (two) times daily. Take 1 tablet by mouth 2 times a day with meals.  . [DISCONTINUED] glimepiride (AMARYL) 2 MG tablet Take 2 mg by mouth 2 (two) times daily.   . [DISCONTINUED] Iron-Vitamin C (VITRON-C) 65-125 MG TABS Take 1 tablet by mouth once a week.  . [DISCONTINUED] Menthol-Zinc Oxide (CALMOSEPTINE) 0.44-20.6 % OINT Apply topically.  . [DISCONTINUED] metFORMIN (GLUCOPHAGE) 500 MG tablet Take 500 mg by mouth 2 (two) times  daily with a meal. Reported on 08/21/2015   No facility-administered encounter medications on file as of 11/10/2017.     Review of Systems  GENERAL: No change in appetite, no fatigue, no weight changes, no fever, chills or weakness MOUTH and THROAT: Denies oral discomfort, gingival pain or bleeding RESPIRATORY: no cough, SOB, DOE, wheezing, hemoptysis CARDIAC: No chest pain, edema or palpitations GI: +constipation GU: Denies dysuria, frequency, hematuria,or discharge PSYCHIATRIC: Denies feelings of depression or anxiety. No report of hallucinations, insomnia, paranoia, or agitation   Immunization History  Administered Date(s) Administered  . Influenza Inj Mdck Quad Pf 01/13/2017  . Influenza Split 01/14/2015  . Influenza-Unspecified 02/10/2010, 02/11/2012, 12/30/2012, 01/14/2015, 01/15/2016, 01/13/2017  . Pneumococcal Conjugate-13 02/04/2017  . Pneumococcal Polysaccharide-23 08/13/2008  .  Zoster 11/11/2012   Pertinent  Health Maintenance Due  Topic Date Due  . HEMOGLOBIN A1C  04/17/25  . FOOT EXAM  12/22/1933  . OPHTHALMOLOGY EXAM  12/22/1933  . URINE MICROALBUMIN  12/22/1935  . DEXA SCAN  12/22/1990  . PNA vac Low Risk Adult (1 of 2 - PCV13) 12/22/1990  . INFLUENZA VACCINE  11/04/2017      Vitals:   11/10/17 1147  BP: 138/76  Pulse: 72  Resp: 18  Temp: 97.6 F (36.4 C)  TempSrc: Oral  SpO2: 99%  Weight: 104 lb 11.2 oz (47.5 kg)  Height: 5\' 3"  (1.6 m)   Body mass index is 18.55 kg/m.  Physical Exam  GENERAL APPEARANCE:  In no acute distress.  SKIN:  Skin is warm and dry.  MOUTH and THROAT: Lips are without lesions. Oral mucosa is moist and without lesions. RESPIRATORY: Breathing is even & unlabored, BS CTAB CARDIAC: RRR, no murmur,no extra heart sounds, no edema, left chest pacemaker GI: +hypoactive bowel sounds EXTREMITIES:  Able to move X 4 extremities PSYCHIATRIC: Alert and oriented X 3. Affect and behavior are appropriate   Recent Labs     11/10/17 1100  WBC 3.8  NEUTROABS 2.1  HGB 11.4*  HCT 33.9*  MCV 90.8  PLT 248    Assessment/Plan  1. Slow transit constipation -  KUB showed moderate to large quantity of fecal material present in the sigmoid and rectal region, start senna S 8.6-50 mg 2 tabs p.o. twice daily x1 week then nightly, MiraLAX 17 g +6 to 8 ounces liquid p.o. twice daily x1 week then daily   2. Chronic anticoagulation - will discontinue Lovenox and continue Coumadin 1.5 mg daily, check INR   3. Paroxysmal atrial fibrillation (HCC)   -rate controlled, continue digoxin 125 mcg 1 tab daily, Toprol-XL 25 mg 3 tabs = 75 mg once a day   4. Controlled type 2 diabetes mellitus without complication, without long-term current use of insulin (HCC) -continue glimepiride 1 mg 1 tab twice a day and Januvia 50 mg 1 tab once a day   5. Chronic heart failure, unspecified heart failure type (HCC)  -  no SOB, continue torsemide 20 mg 1 tab as needed if  weight gain of 3 lbs in 1 day or 5 lbs in 1 week 1/2      Family/ staff Communication: Discussed plan of care with patient.  Labs/tests ordered: None  Goals of care:   Short-term rehabilitation.   Kenard Gower, NP [facility] [phone] (Monday-Friday 8:00 a.m. - 5:00 p.m.) [phone] (after hours)

## 2017-11-12 ENCOUNTER — Encounter: Payer: Self-pay | Admitting: Adult Health

## 2017-11-12 ENCOUNTER — Non-Acute Institutional Stay (SKILLED_NURSING_FACILITY): Payer: Medicare Other | Admitting: Adult Health

## 2017-11-12 ENCOUNTER — Other Ambulatory Visit
Admission: RE | Admit: 2017-11-12 | Discharge: 2017-11-12 | Disposition: A | Discharge status: Home / Self Care | Payer: Medicare Other | Source: Ambulatory Visit | Attending: Internal Medicine | Admitting: Internal Medicine

## 2017-11-12 DIAGNOSIS — K219 Gastro-esophageal reflux disease without esophagitis: Secondary | ICD-10-CM

## 2017-11-12 DIAGNOSIS — I482 Chronic atrial fibrillation, unspecified: Secondary | ICD-10-CM

## 2017-11-12 DIAGNOSIS — E876 Hypokalemia: Secondary | ICD-10-CM | POA: Diagnosis not present

## 2017-11-12 DIAGNOSIS — I481 Persistent atrial fibrillation: Secondary | ICD-10-CM | POA: Diagnosis present

## 2017-11-12 DIAGNOSIS — I509 Heart failure, unspecified: Secondary | ICD-10-CM

## 2017-11-12 DIAGNOSIS — E119 Type 2 diabetes mellitus without complications: Secondary | ICD-10-CM | POA: Diagnosis not present

## 2017-11-12 DIAGNOSIS — Z7901 Long term (current) use of anticoagulants: Secondary | ICD-10-CM | POA: Diagnosis not present

## 2017-11-12 DIAGNOSIS — K5909 Other constipation: Secondary | ICD-10-CM

## 2017-11-12 LAB — PROTIME-INR
INR: 1.95
Prothrombin Time: 22.1 seconds — ABNORMAL HIGH (ref 11.4–15.2)

## 2017-11-12 NOTE — Progress Notes (Signed)
Location:  [facility] Room Number: [id number] Place of Service:  SNF ([id number])   CODE STATUS: FULL  Allergies  Allergen Reactions  . Codeine Nausea Only  . Disopyramide     Other reaction(s): Unknown  . Ibuprofen Diarrhea  . Iodine     blisters  . Norpace [Disopyramide Phosphate]   . Quinidine     Other reaction(s): Unknown  . Terfenadine     Other reaction(s): Unknown  . Topiramate     Other reaction(s): Other (See Comments) Hair loss  . Verapamil     Other reaction(s): Unknown    Chief Complaint  Patient presents with  . Medical Management of Chronic Issues    Afib; heart failure; hypokalemia. Weekly follow up for the first thirty days post hospitalization     HPI:  She is a 84 year old rehab patient in the facility being seen for the management of her chronic illnesses: afib; heart failue hypokalemia. She denies any chest pain; shortness; no blood in stools. There are no nursing concerns at this time.   Past Medical History:  Diagnosis Date  . A-fib ([place])   . Anemia   . Arthritis   . Breast cancer ([place]) 1978  . CHF (congestive heart failure) ([place])   . Diabetes mellitus without complication ([place])   . Diverticulitis   . Dysrhythmia   . GERD (gastroesophageal reflux disease)   . Hyperlipidemia   . Macular degeneration of both eyes   . Mitral valve prolapse   . Mitral valve regurgitation   . Presence of permanent cardiac pacemaker     Past Surgical History:  Procedure Laterality Date  . ABDOMINAL HYSTERECTOMY    . APPENDECTOMY    . BREAST SURGERY    . CARDIAC CATHETERIZATION    . ESOPHAGEAL DILATION    . EYE SURGERY Bilateral    Cataract Extraction with IOL  . INSERT / REPLACE / REMOVE PACEMAKER    . IRRIGATION AND DEBRIDEMENT HEMATOMA Left 08/21/2015   Procedure: IRRIGATION AND DEBRIDEMENT HEMATOMA;  Surgeon: Robert Bellow, MD;  Location: [facility] ORS;  Service: General;  Laterality: Left;  . KNEE ARTHROSCOPY Right   . LEFT  OOPHORECTOMY Left   . MASTECTOMY Left 1978  . MASTECTOMY Right 1978  . PACEMAKER INSERTION  08/11/12  . TEMPORAL ARTERY BIOPSY / LIGATION    . TONSILLECTOMY      Social History   Socioeconomic History  . Marital status: Widowed    Spouse name: Not on file  . Number of children: 1  . Years of education: 77  . Highest education level: 11th grade  Occupational History  . Not on file  Social Needs  . Financial resource strain: Not on file  . Food insecurity:    Worry: Not on file    Inability: Not on file  . Transportation needs:    Medical: Not on file    Non-medical: Not on file  Tobacco Use  . Smoking status: Never Smoker  . Smokeless tobacco: Never Used  Substance and Sexual Activity  . Alcohol use: No    Alcohol/week: 0.0 standard drinks  . Drug use: No  . Sexual activity: Not on file  Lifestyle  . Physical activity:    Days per week: Not on file    Minutes per session: Not on file  . Stress: Not on file  Relationships  . Social connections:    Talks on phone: Not on file    Gets together:  Not on file    Attends religious service: Not on file    Active member of club or organization: Not on file    Attends meetings of clubs or organizations: Not on file    Relationship status: Not on file  . Intimate partner violence:    Fear of current or ex partner: Not on file    Emotionally abused: Not on file    Physically abused: Not on file    Forced sexual activity: Not on file  Other Topics Concern  . Not on file  Social History Narrative   Widowed   1 son   Never smoker or smokeless tobacco user   No alcohol use   Full Code   Family History  Problem Relation Age of Onset  . Hypertension Mother       VITAL SIGNS BP 138/76   Pulse 60   Temp 97.6 F (36.4 C) (Oral)   Resp 18   Ht 5\' 3"  (1.6 m)   Wt 100 lb 4.8 oz (45.5 kg)   SpO2 99%   BMI 17.77 kg/m   Outpatient Encounter Medications as of 11/12/2017  Medication Sig  . acetaminophen (TYLENOL) 325  MG tablet Take 2 tablets (650 mg total) by mouth every 6 (six) hours as needed for mild pain (or Fever >/= 101).  Marland Kitchen alum & mag hydroxide-simeth (MAALOX PLUS) 400-400-40 MG/5ML suspension Take 30 mLs by mouth every 4 (four) hours as needed.  . digoxin (LANOXIN) 0.125 MG tablet Take 0.125 mg by mouth daily. CHF- * Hold for HR < 60  . ENSURE (ENSURE) Take 237 mLs by mouth daily. 2pm  . glimepiride (AMARYL) 1 MG tablet Take 1 mg by mouth 2 (two) times daily.  . magnesium hydroxide (MILK OF MAGNESIA) 400 MG/5ML suspension Take 30 mLs by mouth every 4 (four) hours as needed. Constipation/ no BM for 2 days  . metolazone (ZAROXOLYN) 2.5 MG tablet Take 1.25 mg by mouth once a week. 1/2 tab   On Wednesday  . metoprolol succinate (TOPROL-XL) 50 MG 24 hr tablet Take 75 mg by mouth daily.   . Multiple Vitamins-Minerals (PRESERVISION AREDS 2 PO) Take 1 tablet by mouth 2 (two) times daily.  . pantoprazole (PROTONIX) 40 MG tablet Take 40 mg by mouth daily.  . polyethylene glycol (MIRALAX / GLYCOLAX) packet Take 17 g by mouth 2 (two) times daily.  . polyvinyl alcohol (ARTIFICIAL TEARS) 1.4 % ophthalmic solution Place 1 drop into both eyes daily.  . potassium chloride (K-DUR,KLOR-CON) 10 MEQ tablet Take 10 mEq by mouth 3 (three) times daily.   Marland Kitchen senna-docusate (SENOKOT-S) 8.6-50 MG tablet Take 2 tablets by mouth 2 (two) times daily.   . sitaGLIPtin (JANUVIA) 50 MG tablet Take 50 mg by mouth daily.  Marland Kitchen torsemide (DEMADEX) 20 MG tablet Take 30 mg by mouth daily. Give if 3 lbs increase in one day or 5 lbs in one week  . UNABLE TO FIND Diet Type: NAS/NCS  . warfarin (COUMADIN) 2.5 MG tablet Take 2.0 mg by mouth daily.  . [DISCONTINUED] warfarin (COUMADIN) 1 MG tablet Take 1.5 mg by mouth daily at 6 PM.    No facility-administered encounter medications on file as of 11/12/2017.      SIGNIFICANT DIAGNOSTIC EXAMS  LABS REVIEWED TODAY:   11-10-17: wbc 3.8; hgb 11.4; hct 33.9; mcv 90.8; plt 248; INR 1.09;  11-12-17 INR  195  Review of Systems  Constitutional: Negative for malaise/fatigue.  Respiratory: Negative for cough and shortness of  breath.   Cardiovascular: Negative for chest pain, palpitations and leg swelling.  Gastrointestinal: Negative for abdominal pain, constipation and heartburn.  Musculoskeletal: Negative for back pain, joint pain and myalgias.  Skin: Negative.   Neurological: Negative for dizziness.  Psychiatric/Behavioral: The patient is not nervous/anxious.    Physical Exam  Constitutional: She is oriented to person, place, and time. She appears well-developed and well-nourished. No distress.  Neck: No thyromegaly present.  Cardiovascular: Normal rate, regular rhythm, normal heart sounds and intact distal pulses.  Pulmonary/Chest: Effort normal and breath sounds normal. No respiratory distress.  Abdominal: Soft. Bowel sounds are normal. She exhibits no distension. There is no tenderness.  Musculoskeletal: Normal range of motion. She exhibits no edema.  Lymphadenopathy:    She has no cervical adenopathy.  Neurological: She is alert and oriented to person, place, and time.  Skin: Skin is warm and dry. She is not diaphoretic.  Psychiatric: She has a normal mood and affect.     ASSESSMENT/ PLAN:  TODAY:   1. Atrial fibrillation: heart rate stable; will continue dig 0.125 mg daily toprol xl 50 mg daily for rate control. Is on long term coumadin therapy: will increase coumadin to 2.5 mg day and will check inr on 11-17-17  2. Chronic heart failure: is stable will continue demadex 30 mg daily as needed for weight gain 3 pounds in one day will continue toprol xl 50 mg day and zaroxolyn 1.25 mg weekly   3. Hypokalemia: stable will continue k+ 10 meq three times daily   4. Controlled type 2 diabetes mellitus without complications without long term insulin use: is stable will continue amaryl 1 mg twice daily januvia 50 mg daily   5. gerd without esophagitis: is stable will continue  protonix 40 mg daily   6.  Chronic constipation: is stable will continue miralax twice daily senna 2 tabs twice daily   MD is aware of resident's narcotic use and is in agreement with current plan of care. We will attempt to wean resident as apropriate   Ok Edwards NP [facility]  Contact [phone] Monday through Friday 8am- 5pm  After hours call [phone]

## 2017-11-15 ENCOUNTER — Encounter: Payer: Self-pay | Admitting: Adult Health

## 2017-11-15 ENCOUNTER — Non-Acute Institutional Stay (SKILLED_NURSING_FACILITY): Payer: Medicare Other | Admitting: Adult Health

## 2017-11-15 DIAGNOSIS — I482 Chronic atrial fibrillation, unspecified: Secondary | ICD-10-CM

## 2017-11-15 DIAGNOSIS — E119 Type 2 diabetes mellitus without complications: Secondary | ICD-10-CM | POA: Diagnosis not present

## 2017-11-15 DIAGNOSIS — K219 Gastro-esophageal reflux disease without esophagitis: Secondary | ICD-10-CM | POA: Diagnosis not present

## 2017-11-15 DIAGNOSIS — K5909 Other constipation: Secondary | ICD-10-CM | POA: Insufficient documentation

## 2017-11-15 DIAGNOSIS — E876 Hypokalemia: Secondary | ICD-10-CM | POA: Insufficient documentation

## 2017-11-15 NOTE — Progress Notes (Signed)
Location:   [facility] Room Number: [phone] Place of Service:  SNF (31)    CODE STATUS: FULL  Allergies  Allergen Reactions  . Codeine Nausea Only  . Disopyramide     Other reaction(s): Unknown  . Ibuprofen Diarrhea  . Iodine     blisters  . Norpace [Disopyramide Phosphate]   . Quinidine     Other reaction(s): Unknown  . Terfenadine     Other reaction(s): Unknown  . Topiramate     Other reaction(s): Other (See Comments) Hair loss  . Verapamil     Other reaction(s): Unknown    Chief Complaint  Patient presents with  . Discharge Note    Discharging from SNF on 11/17/2017    HPI:  She is being discharged to home with home health for pt/ot/rn. She will need a front wheel walker. She will need her prescriptions written and will need to follow up with her medical provider. She had been admitted to this facility after a hospitalization for GI bleed. She has participated in therapy and is now ready for discharge to home.   Past Medical History:  Diagnosis Date  . A-fib ([place])   . Anemia   . Arthritis   . Breast cancer ([place]) 1978  . CHF (congestive heart failure) ([place])   . Diabetes mellitus without complication ([place])   . Diverticulitis   . Dysrhythmia   . GERD (gastroesophageal reflux disease)   . Hyperlipidemia   . Macular degeneration of both eyes   . Mitral valve prolapse   . Mitral valve regurgitation   . Presence of permanent cardiac pacemaker     Past Surgical History:  Procedure Laterality Date  . ABDOMINAL HYSTERECTOMY    . APPENDECTOMY    . BREAST SURGERY    . CARDIAC CATHETERIZATION    . ESOPHAGEAL DILATION    . EYE SURGERY Bilateral    Cataract Extraction with IOL  . INSERT / REPLACE / REMOVE PACEMAKER    . IRRIGATION AND DEBRIDEMENT HEMATOMA Left 08/21/2015   Procedure: IRRIGATION AND DEBRIDEMENT HEMATOMA;  Surgeon: Robert Bellow, MD;  Location: [facility] ORS;  Service: General;  Laterality: Left;  . KNEE ARTHROSCOPY Right     . LEFT OOPHORECTOMY Left   . MASTECTOMY Left 1978  . MASTECTOMY Right 1978  . PACEMAKER INSERTION  08/11/12  . TEMPORAL ARTERY BIOPSY / LIGATION    . TONSILLECTOMY      Social History   Socioeconomic History  . Marital status: Widowed    Spouse name: Not on file  . Number of children: 1  . Years of education: 71  . Highest education level: 11th grade  Occupational History  . Not on file  Social Needs  . Financial resource strain: Not on file  . Food insecurity:    Worry: Not on file    Inability: Not on file  . Transportation needs:    Medical: Not on file    Non-medical: Not on file  Tobacco Use  . Smoking status: Never Smoker  . Smokeless tobacco: Never Used  Substance and Sexual Activity  . Alcohol use: No    Alcohol/week: 0.0 standard drinks  . Drug use: No  . Sexual activity: Not on file  Lifestyle  . Physical activity:    Days per week: Not on file    Minutes per session: Not on file  . Stress: Not on file  Relationships  . Social connections:    Talks on phone:  Not on file    Gets together: Not on file    Attends religious service: Not on file    Active member of club or organization: Not on file    Attends meetings of clubs or organizations: Not on file    Relationship status: Not on file  . Intimate partner violence:    Fear of current or ex partner: Not on file    Emotionally abused: Not on file    Physically abused: Not on file    Forced sexual activity: Not on file  Other Topics Concern  . Not on file  Social History Narrative   Widowed   1 son   Never smoker or smokeless tobacco user   No alcohol use   Full Code   Family History  Problem Relation Age of Onset  . Hypertension Mother     VITAL SIGNS BP 138/76   Pulse 69   Temp 97.6 F (36.4 C) (Oral)   Resp 18   Ht 5\' 3"  (1.6 m)   Wt 104 lb 14.4 oz (47.6 kg)   SpO2 99%   BMI 18.58 kg/m   Patient's Medications  New Prescriptions   No medications on file  Previous Medications    ACETAMINOPHEN (TYLENOL) 325 MG TABLET    Take 2 tablets (650 mg total) by mouth every 6 (six) hours as needed for mild pain (or Fever >/= 101).   ALUM & MAG HYDROXIDE-SIMETH (MAALOX PLUS) 400-400-40 MG/5ML SUSPENSION    Take 30 mLs by mouth every 4 (four) hours as needed.   DIGOXIN (LANOXIN) 0.125 MG TABLET    Take 0.125 mg by mouth daily. CHF- * Hold for HR < 60   ENSURE (ENSURE)    Take 237 mLs by mouth daily. 2pm   GLIMEPIRIDE (AMARYL) 1 MG TABLET    Take 1 mg by mouth 2 (two) times daily.   MAGNESIUM HYDROXIDE (MILK OF MAGNESIA) 400 MG/5ML SUSPENSION    Take 30 mLs by mouth every 4 (four) hours as needed. Constipation/ no BM for 2 days   METOLAZONE (ZAROXOLYN) 2.5 MG TABLET    Take 1.25 mg by mouth once a week. 1/2 tab   On Wednesday   METOPROLOL SUCCINATE (TOPROL-XL) 50 MG 24 HR TABLET    Take 75 mg by mouth daily.    MULTIPLE VITAMINS-MINERALS (PRESERVISION AREDS 2 PO)    Take 1 tablet by mouth 2 (two) times daily.   PANTOPRAZOLE (PROTONIX) 40 MG TABLET    Take 40 mg by mouth daily.   POLYETHYLENE GLYCOL (MIRALAX / GLYCOLAX) PACKET    Take 17 g by mouth 2 (two) times daily.   POLYVINYL ALCOHOL (ARTIFICIAL TEARS) 1.4 % OPHTHALMIC SOLUTION    Place 1 drop into both eyes daily.   POTASSIUM CHLORIDE (K-DUR,KLOR-CON) 10 MEQ TABLET    Take 10 mEq by mouth 3 (three) times daily.    SENNA-DOCUSATE (SENOKOT-S) 8.6-50 MG TABLET    Take 2 tablets by mouth 2 (two) times daily.    SITAGLIPTIN (JANUVIA) 50 MG TABLET    Take 50 mg by mouth daily.   TORSEMIDE (DEMADEX) 20 MG TABLET    Take 30 mg by mouth daily. Also give 1 tab (20 mg)  if 3 lbs increase in one day or 5 lbs in one week   UNABLE TO FIND    Diet Type: NAS/NCS   WARFARIN (COUMADIN) 2.5 MG TABLET    Take 2.5 mg by mouth daily.  Modified Medications   No medications  on file  Discontinued Medications   No medications on file     SIGNIFICANT DIAGNOSTIC EXAMS   LABS REVIEWED TODAY:   11-10-17: wbc 3.8; hgb 11.4; hct 33.9; mcv 90.8; plt  248; INR 1.09;  11-12-17 INR 1.95  NO NEW LABS.   Review of Systems  Constitutional: Negative for malaise/fatigue.  Respiratory: Negative for cough and shortness of breath.   Cardiovascular: Negative for chest pain, palpitations and leg swelling.  Gastrointestinal: Negative for abdominal pain, constipation and heartburn.  Musculoskeletal: Negative for back pain, joint pain and myalgias.  Skin: Negative.   Neurological: Negative for dizziness.  Psychiatric/Behavioral: The patient is not nervous/anxious.     Physical Exam  Constitutional: She is oriented to person, place, and time. She appears well-developed and well-nourished. No distress.  Neck: No thyromegaly present.  Cardiovascular: Normal rate, regular rhythm, normal heart sounds and intact distal pulses.  Pulmonary/Chest: Effort normal and breath sounds normal. No respiratory distress.  Abdominal: Soft. Bowel sounds are normal. She exhibits no distension. There is no tenderness.  Musculoskeletal: Normal range of motion. She exhibits no edema.  Lymphadenopathy:    She has no cervical adenopathy.  Neurological: She is alert and oriented to person, place, and time.  Skin: Skin is warm and dry. She is not diaphoretic.  Psychiatric: She has a normal mood and affect.    ASSESSMENT/ PLAN:  Patient is being discharged with the following home health services:  Pt/ot/rn: to evaluate and treat as indicated for gait balance strength adl training; inr management.   Patient is being discharged with the following durable medical equipment:  Front wheel walker to allow patient to maintain her current level of independent with adls.   Patient has been advised to f/u with their PCP in 1-2 weeks to bring them up to date on their rehab stay.  Social services at facility was responsible for arranging this appointment.  Pt was provided with a 30 day supply of prescriptions for medications and refills must be obtained from their PCP.  For controlled  substances, a more limited supply may be provided adequate until PCP appointment only.  A 30 day supply of her prescription medications have been written as listed above.    Time spent with patient: 40 minutes: discussed home health needs and expectations; dme and medications. Verbalized understanding.    Ok Edwards NP [facility]  Contact [phone] Monday through Friday 8am- 5pm  After hours call [phone]

## 2017-11-23 DIAGNOSIS — Z8719 Personal history of other diseases of the digestive system: Secondary | ICD-10-CM | POA: Insufficient documentation

## 2018-02-10 ENCOUNTER — Other Ambulatory Visit: Payer: Self-pay

## 2018-02-10 ENCOUNTER — Encounter
Admission: RE | Admit: 2018-02-10 | Discharge: 2018-02-10 | Disposition: A | Discharge status: Home / Self Care | Payer: Medicare Other | Source: Ambulatory Visit | Attending: Orthopedic Surgery | Admitting: Orthopedic Surgery

## 2018-02-10 DIAGNOSIS — Z01818 Encounter for other preprocedural examination: Secondary | ICD-10-CM | POA: Insufficient documentation

## 2018-02-10 NOTE — Pre-Procedure Instructions (Signed)
Sent Pacemaker form with patient for 8:30 appt.in the morning to interrogate pacemaker

## 2018-02-10 NOTE — Patient Instructions (Signed)
Your procedure is scheduled on: Mon 11/11 Report to Day Surgery. To find out your arrival time please call [phone] between 1PM - 3PM on Friday  11/8.  Remember: Instructions that are not followed completely may result in serious medical risk,  up to and including death, or upon the discretion of your surgeon and anesthesiologist your  surgery may need to be rescheduled.     _X__ 1. Do not eat food after midnight the night before your procedure.                 No gum chewing or hard candies. You may drink clear liquids up to 2 hours                 before you are scheduled to arrive for your surgery- DO not drink clear                 liquids within 2 hours of the start of your surgery.                 Clear Liquids include:  water, apple juice without pulp, clear carbohydrate                 drink such as Clearfast of Gatorade, Black Coffee or Tea (Do not add                 anything to coffee or tea).  __X__2.  On the morning of surgery brush your teeth with toothpaste and water, you                may rinse your mouth with mouthwash if you wish.  Do not swallow any toothpaste of mouthwash.     _X__ 3.  No Alcohol for 24 hours before or after surgery.   ___ 4.  Do Not Smoke or use e-cigarettes For 24 Hours Prior to Your Surgery.                 Do not use any chewable tobacco products for at least 6 hours prior to                 surgery.  ____  5.  Bring all medications with you on the day of surgery if instructed.   _x___  6.  Notify your doctor if there is any change in your medical condition      (cold, fever, infections).     Do not wear jewelry, make-up, hairpins, clips or nail polish. Do not wear lotions, powders, or perfumes. You may wear deodorant. Do not shave 48 hours prior to surgery. Men may shave face and neck. Do not bring valuables to the hospital.    [facility] is not responsible for any belongings or valuables.  Contacts,  dentures or bridgework may not be worn into surgery. Leave your suitcase in the car. After surgery it may be brought to your room. For patients admitted to the hospital, discharge time is determined by your treatment team.   Patients discharged the day of surgery will not be allowed to drive home.   Please read over the following fact sheets that you were given:    __x__ Take these medicines the morning of surgery with A SIP OF WATER:    1. digoxin (LANOXIN) 0.125 MG tablet  2. metoprolol succinate (TOPROL-XL) 50 MG 24 hr tablet  3. pantoprazole (PROTONIX) 40 MG tablet  Take extra dose the night before an done the morning of  surgery  4.  5.  6.  ____ Fleet Enema (as directed)   __x__ Use CHG Soap as directed  ____ Use inhalers on the day of surgery  ____ Stop metformin 2 days prior to surgery    ____ Take 1/2 of usual insulin dose the night before surgery. No insulin the morning          of surgery.   __x__ Stop Coumadin as per Dr. Ammie Ferrier instructions  ____ Stop Anti-inflammatories on    ____ Stop supplements until after surgery.    ____ Bring C-Pap to the hospital.

## 2018-02-11 ENCOUNTER — Inpatient Hospital Stay: Admission: RE | Admit: 2018-02-11 | Payer: Medicare Other | Source: Ambulatory Visit

## 2018-02-13 MED ORDER — CEFAZOLIN SODIUM-DEXTROSE 1-4 GM/50ML-% IV SOLN
1.0000 g | Freq: Once | INTRAVENOUS | Status: AC
  Administered 2018-02-14: 1 g via INTRAVENOUS

## 2018-02-14 ENCOUNTER — Other Ambulatory Visit: Payer: Self-pay

## 2018-02-14 ENCOUNTER — Encounter
Admission: RE | Disposition: A | Discharge status: Home / Self Care | Payer: Self-pay | Source: Ambulatory Visit | Attending: Orthopedic Surgery

## 2018-02-14 ENCOUNTER — Ambulatory Visit: Payer: Medicare Other | Admitting: Certified Registered"

## 2018-02-14 ENCOUNTER — Ambulatory Visit: Payer: Medicare Other

## 2018-02-14 ENCOUNTER — Encounter: Payer: Self-pay | Admitting: *Deleted

## 2018-02-14 ENCOUNTER — Ambulatory Visit
Admission: RE | Admit: 2018-02-14 | Discharge: 2018-02-15 | Disposition: A | Discharge status: Home / Self Care | Payer: Medicare Other | Source: Ambulatory Visit | Attending: Orthopedic Surgery | Admitting: Orthopedic Surgery

## 2018-02-14 DIAGNOSIS — Z853 Personal history of malignant neoplasm of breast: Secondary | ICD-10-CM | POA: Insufficient documentation

## 2018-02-14 DIAGNOSIS — I495 Sick sinus syndrome: Secondary | ICD-10-CM | POA: Insufficient documentation

## 2018-02-14 DIAGNOSIS — M25511 Pain in right shoulder: Secondary | ICD-10-CM | POA: Insufficient documentation

## 2018-02-14 DIAGNOSIS — Z9889 Other specified postprocedural states: Secondary | ICD-10-CM

## 2018-02-14 DIAGNOSIS — I4891 Unspecified atrial fibrillation: Secondary | ICD-10-CM | POA: Insufficient documentation

## 2018-02-14 DIAGNOSIS — I509 Heart failure, unspecified: Secondary | ICD-10-CM | POA: Insufficient documentation

## 2018-02-14 DIAGNOSIS — S52609A Unspecified fracture of lower end of unspecified ulna, initial encounter for closed fracture: Secondary | ICD-10-CM

## 2018-02-14 DIAGNOSIS — E1140 Type 2 diabetes mellitus with diabetic neuropathy, unspecified: Secondary | ICD-10-CM | POA: Insufficient documentation

## 2018-02-14 DIAGNOSIS — S52509A Unspecified fracture of the lower end of unspecified radius, initial encounter for closed fracture: Secondary | ICD-10-CM | POA: Diagnosis present

## 2018-02-14 DIAGNOSIS — D649 Anemia, unspecified: Secondary | ICD-10-CM | POA: Insufficient documentation

## 2018-02-14 DIAGNOSIS — Z9012 Acquired absence of left breast and nipple: Secondary | ICD-10-CM | POA: Insufficient documentation

## 2018-02-14 DIAGNOSIS — W19XXXA Unspecified fall, initial encounter: Secondary | ICD-10-CM | POA: Insufficient documentation

## 2018-02-14 DIAGNOSIS — Z8051 Family history of malignant neoplasm of kidney: Secondary | ICD-10-CM | POA: Insufficient documentation

## 2018-02-14 DIAGNOSIS — Z7984 Long term (current) use of oral hypoglycemic drugs: Secondary | ICD-10-CM | POA: Diagnosis not present

## 2018-02-14 DIAGNOSIS — I341 Nonrheumatic mitral (valve) prolapse: Secondary | ICD-10-CM | POA: Diagnosis not present

## 2018-02-14 DIAGNOSIS — Z90710 Acquired absence of both cervix and uterus: Secondary | ICD-10-CM | POA: Insufficient documentation

## 2018-02-14 DIAGNOSIS — E785 Hyperlipidemia, unspecified: Secondary | ICD-10-CM | POA: Diagnosis not present

## 2018-02-14 DIAGNOSIS — Z8379 Family history of other diseases of the digestive system: Secondary | ICD-10-CM | POA: Insufficient documentation

## 2018-02-14 DIAGNOSIS — S52551A Other extraarticular fracture of lower end of right radius, initial encounter for closed fracture: Secondary | ICD-10-CM | POA: Insufficient documentation

## 2018-02-14 DIAGNOSIS — Z79899 Other long term (current) drug therapy: Secondary | ICD-10-CM | POA: Insufficient documentation

## 2018-02-14 DIAGNOSIS — M1990 Unspecified osteoarthritis, unspecified site: Secondary | ICD-10-CM | POA: Insufficient documentation

## 2018-02-14 DIAGNOSIS — Z8052 Family history of malignant neoplasm of bladder: Secondary | ICD-10-CM | POA: Insufficient documentation

## 2018-02-14 DIAGNOSIS — Z888 Allergy status to other drugs, medicaments and biological substances status: Secondary | ICD-10-CM | POA: Insufficient documentation

## 2018-02-14 DIAGNOSIS — Z800 Family history of malignant neoplasm of digestive organs: Secondary | ICD-10-CM | POA: Insufficient documentation

## 2018-02-14 DIAGNOSIS — I110 Hypertensive heart disease with heart failure: Secondary | ICD-10-CM | POA: Diagnosis not present

## 2018-02-14 DIAGNOSIS — Z885 Allergy status to narcotic agent status: Secondary | ICD-10-CM | POA: Insufficient documentation

## 2018-02-14 DIAGNOSIS — Z8489 Family history of other specified conditions: Secondary | ICD-10-CM | POA: Diagnosis not present

## 2018-02-14 DIAGNOSIS — Z808 Family history of malignant neoplasm of other organs or systems: Secondary | ICD-10-CM | POA: Diagnosis not present

## 2018-02-14 DIAGNOSIS — Z825 Family history of asthma and other chronic lower respiratory diseases: Secondary | ICD-10-CM | POA: Insufficient documentation

## 2018-02-14 DIAGNOSIS — K219 Gastro-esophageal reflux disease without esophagitis: Secondary | ICD-10-CM | POA: Diagnosis not present

## 2018-02-14 DIAGNOSIS — I2510 Atherosclerotic heart disease of native coronary artery without angina pectoris: Secondary | ICD-10-CM | POA: Diagnosis not present

## 2018-02-14 DIAGNOSIS — Z8042 Family history of malignant neoplasm of prostate: Secondary | ICD-10-CM | POA: Insufficient documentation

## 2018-02-14 DIAGNOSIS — Z7901 Long term (current) use of anticoagulants: Secondary | ICD-10-CM | POA: Insufficient documentation

## 2018-02-14 DIAGNOSIS — Z950 Presence of cardiac pacemaker: Secondary | ICD-10-CM | POA: Insufficient documentation

## 2018-02-14 DIAGNOSIS — Z8781 Personal history of (healed) traumatic fracture: Secondary | ICD-10-CM

## 2018-02-14 HISTORY — PX: OPEN REDUCTION INTERNAL FIXATION (ORIF) DISTAL RADIAL FRACTURE: SHX5989

## 2018-02-14 LAB — PROTIME-INR
INR: 1.53
Prothrombin Time: 18.2 seconds — ABNORMAL HIGH (ref 11.4–15.2)

## 2018-02-14 LAB — GLUCOSE, CAPILLARY
Glucose-Capillary: 156 mg/dL — ABNORMAL HIGH (ref 70–99)
Glucose-Capillary: 125 mg/dL — ABNORMAL HIGH (ref 70–99)

## 2018-02-14 SURGERY — OPEN REDUCTION INTERNAL FIXATION (ORIF) DISTAL RADIUS FRACTURE
Anesthesia: General | Laterality: Right

## 2018-02-14 MED ORDER — NEOMYCIN-POLYMYXIN B GU 40-200000 IR SOLN
Status: DC | PRN
  Administered 2018-02-14: 2 mL

## 2018-02-14 MED ORDER — WARFARIN SODIUM 1 MG PO TABS
1.5000 mg | ORAL_TABLET | Freq: Every morning | ORAL | Status: DC
  Administered 2018-02-15: 1.5 mg via ORAL
  Filled 2018-02-14: qty 1

## 2018-02-14 MED ORDER — FENTANYL CITRATE (PF) 100 MCG/2ML IJ SOLN
INTRAMUSCULAR | Status: AC
  Administered 2018-02-14: 25 ug via INTRAVENOUS
  Filled 2018-02-14: qty 2

## 2018-02-14 MED ORDER — ONDANSETRON HCL 4 MG/2ML IJ SOLN: 4.0000 mg | Freq: Four times a day (QID) | INTRAMUSCULAR | Status: DC | PRN

## 2018-02-14 MED ORDER — ACETAMINOPHEN 160 MG/5ML PO SOLN
500.0000 mg | ORAL | Status: DC | PRN
  Administered 2018-02-15 (×2): 500 mg via ORAL
  Filled 2018-02-14 (×3): qty 20.3

## 2018-02-14 MED ORDER — WARFARIN - PHYSICIAN DOSING INPATIENT: Freq: Every day | Status: DC

## 2018-02-14 MED ORDER — FENTANYL CITRATE (PF) 100 MCG/2ML IJ SOLN
INTRAMUSCULAR | Status: AC
  Filled 2018-02-14: qty 2

## 2018-02-14 MED ORDER — TORSEMIDE 20 MG PO TABS
20.0000 mg | ORAL_TABLET | Freq: Every day | ORAL | Status: DC
  Administered 2018-02-14: 20 mg via ORAL
  Filled 2018-02-14: qty 1

## 2018-02-14 MED ORDER — ONDANSETRON HCL 4 MG/2ML IJ SOLN
INTRAMUSCULAR | Status: DC | PRN
  Administered 2018-02-14: 4 mg via INTRAVENOUS

## 2018-02-14 MED ORDER — NEOMYCIN-POLYMYXIN B GU 40-200000 IR SOLN
Status: AC
  Filled 2018-02-14: qty 1

## 2018-02-14 MED ORDER — LINAGLIPTIN 5 MG PO TABS
5.0000 mg | ORAL_TABLET | Freq: Every day | ORAL | Status: DC
  Administered 2018-02-15: 5 mg via ORAL
  Filled 2018-02-14: qty 1

## 2018-02-14 MED ORDER — HYDROMORPHONE HCL 1 MG/ML IJ SOLN
0.2500 mg | INTRAMUSCULAR | Status: AC | PRN
  Administered 2018-02-14 (×8): 0.25 mg via INTRAVENOUS

## 2018-02-14 MED ORDER — CEFAZOLIN SODIUM-DEXTROSE 1-4 GM/50ML-% IV SOLN
INTRAVENOUS | Status: AC
  Filled 2018-02-14: qty 50

## 2018-02-14 MED ORDER — METOPROLOL SUCCINATE ER 50 MG PO TB24
50.0000 mg | ORAL_TABLET | Freq: Every day | ORAL | Status: DC
  Filled 2018-02-14: qty 1

## 2018-02-14 MED ORDER — METOCLOPRAMIDE HCL 10 MG PO TABS: 5.0000 mg | ORAL_TABLET | Freq: Three times a day (TID) | ORAL | Status: DC | PRN

## 2018-02-14 MED ORDER — KETOROLAC TROMETHAMINE 15 MG/ML IJ SOLN
INTRAMUSCULAR | Status: AC
  Administered 2018-02-14: 15 mg via INTRAVENOUS
  Filled 2018-02-14: qty 1

## 2018-02-14 MED ORDER — POTASSIUM CHLORIDE CRYS ER 20 MEQ PO TBCR
20.0000 meq | EXTENDED_RELEASE_TABLET | Freq: Two times a day (BID) | ORAL | Status: DC
  Administered 2018-02-14 – 2018-02-15 (×2): 20 meq via ORAL
  Filled 2018-02-14 (×2): qty 1

## 2018-02-14 MED ORDER — FENTANYL CITRATE (PF) 100 MCG/2ML IJ SOLN
25.0000 ug | INTRAMUSCULAR | Status: AC | PRN
  Administered 2018-02-14 (×6): 25 ug via INTRAVENOUS

## 2018-02-14 MED ORDER — HYDROMORPHONE HCL 1 MG/ML IJ SOLN
INTRAMUSCULAR | Status: AC
  Administered 2018-02-14: 0.25 mg via INTRAVENOUS
  Filled 2018-02-14: qty 1

## 2018-02-14 MED ORDER — PROPOFOL 10 MG/ML IV BOLUS
INTRAVENOUS | Status: DC | PRN
  Administered 2018-02-14: 20 mg via INTRAVENOUS
  Administered 2018-02-14: 90 mg via INTRAVENOUS

## 2018-02-14 MED ORDER — SODIUM CHLORIDE 0.9 % IV SOLN
INTRAVENOUS | Status: DC
  Administered 2018-02-14: 13:00:00 via INTRAVENOUS

## 2018-02-14 MED ORDER — HYDROCODONE-ACETAMINOPHEN 5-325 MG PO TABS: 1.0000 | ORAL_TABLET | Freq: Four times a day (QID) | ORAL | 0 refills | Status: DC | PRN

## 2018-02-14 MED ORDER — GLIMEPIRIDE 1 MG PO TABS
1.0000 mg | ORAL_TABLET | Freq: Two times a day (BID) | ORAL | Status: DC
  Administered 2018-02-15: 1 mg via ORAL
  Filled 2018-02-14 (×2): qty 1

## 2018-02-14 MED ORDER — LIDOCAINE HCL (CARDIAC) PF 100 MG/5ML IV SOSY
PREFILLED_SYRINGE | INTRAVENOUS | Status: DC | PRN
  Administered 2018-02-14: 50 mg via INTRAVENOUS

## 2018-02-14 MED ORDER — ONDANSETRON HCL 4 MG/2ML IJ SOLN: 4.0000 mg | Freq: Once | INTRAMUSCULAR | Status: DC | PRN

## 2018-02-14 MED ORDER — ONDANSETRON HCL 4 MG PO TABS: 4.0000 mg | ORAL_TABLET | Freq: Four times a day (QID) | ORAL | Status: DC | PRN

## 2018-02-14 MED ORDER — METOCLOPRAMIDE HCL 5 MG/ML IJ SOLN: 5.0000 mg | Freq: Three times a day (TID) | INTRAMUSCULAR | Status: DC | PRN

## 2018-02-14 MED ORDER — SODIUM CHLORIDE 0.9 % IV SOLN
INTRAVENOUS | Status: DC
  Administered 2018-02-14 – 2018-02-15 (×2): via INTRAVENOUS

## 2018-02-14 MED ORDER — KETOROLAC TROMETHAMINE 15 MG/ML IJ SOLN
15.0000 mg | Freq: Once | INTRAMUSCULAR | Status: AC
  Administered 2018-02-14: 15 mg via INTRAVENOUS

## 2018-02-14 MED ORDER — DIGOXIN 125 MCG PO TABS
0.1250 mg | ORAL_TABLET | Freq: Every day | ORAL | Status: DC
  Filled 2018-02-14: qty 1

## 2018-02-14 MED ORDER — LIDOCAINE HCL (PF) 2 % IJ SOLN
INTRAMUSCULAR | Status: AC
  Filled 2018-02-14: qty 10

## 2018-02-14 MED ORDER — FENTANYL CITRATE (PF) 100 MCG/2ML IJ SOLN
INTRAMUSCULAR | Status: DC | PRN
  Administered 2018-02-14 (×3): 25 ug via INTRAVENOUS

## 2018-02-14 MED ORDER — HYDROCODONE-ACETAMINOPHEN 5-325 MG PO TABS: 1.0000 | ORAL_TABLET | ORAL | Status: DC | PRN

## 2018-02-14 SURGICAL SUPPLY — 41 items
BANDAGE ACE 4X5 VEL STRL LF (GAUZE/BANDAGES/DRESSINGS) ×3 IMPLANT
BIT DRILL 2 FAST STEP (BIT) ×3 IMPLANT
BIT DRILL 2.5X4 QC (BIT) ×3 IMPLANT
CANISTER SUCT 1200ML W/VALVE (MISCELLANEOUS) ×3 IMPLANT
CHLORAPREP W/TINT 26ML (MISCELLANEOUS) ×3 IMPLANT
COVER WAND RF STERILE (DRAPES) ×3 IMPLANT
CUFF TOURN 18 STER (MISCELLANEOUS) IMPLANT
DRAPE FLUOR MINI C-ARM 54X84 (DRAPES) ×3 IMPLANT
ELECT REM PT RETURN 9FT ADLT (ELECTROSURGICAL) ×3
ELECTRODE REM PT RTRN 9FT ADLT (ELECTROSURGICAL) ×1 IMPLANT
GAUZE PETRO XEROFOAM 1X8 (MISCELLANEOUS) ×6 IMPLANT
GAUZE SPONGE 4X4 12PLY STRL (GAUZE/BANDAGES/DRESSINGS) ×3 IMPLANT
GLOVE SURG SYN 9.0  PF PI (GLOVE) ×2
GLOVE SURG SYN 9.0 PF PI (GLOVE) ×1 IMPLANT
GOWN SRG 2XL LVL 4 RGLN SLV (GOWNS) ×1 IMPLANT
GOWN STRL NON-REIN 2XL LVL4 (GOWNS) ×2
GOWN STRL REUS W/ TWL LRG LVL3 (GOWN DISPOSABLE) ×1 IMPLANT
GOWN STRL REUS W/TWL LRG LVL3 (GOWN DISPOSABLE) ×2
K-WIRE 1.6 (WIRE) ×2
K-WIRE FX5X1.6XNS BN SS (WIRE) ×1
KIT TURNOVER KIT A (KITS) ×3 IMPLANT
KWIRE FX5X1.6XNS BN SS (WIRE) ×1 IMPLANT
NEEDLE FILTER BLUNT 18X 1/2SAF (NEEDLE) ×2
NEEDLE FILTER BLUNT 18X1 1/2 (NEEDLE) ×1 IMPLANT
NS IRRIG 500ML POUR BTL (IV SOLUTION) ×3 IMPLANT
PACK EXTREMITY ARMC (MISCELLANEOUS) ×3 IMPLANT
PAD CAST CTTN 4X4 STRL (SOFTGOODS) ×2 IMPLANT
PADDING CAST COTTON 4X4 STRL (SOFTGOODS) ×4
PEG SUBCHONDRAL SMOOTH 2.0X14 (Peg) ×3 IMPLANT
PEG SUBCHONDRAL SMOOTH 2.0X16 (Peg) ×3 IMPLANT
PEG SUBCHONDRAL SMOOTH 2.0X18 (Peg) ×3 IMPLANT
PEG SUBCHONDRAL SMOOTH 2.0X20 (Peg) ×6 IMPLANT
PLATE SHORT 24.4X51.3 RT (Plate) ×3 IMPLANT
SCALPEL PROTECTED #15 DISP (BLADE) ×6 IMPLANT
SCREW CORT 3.5X10 LNG (Screw) ×9 IMPLANT
SPLINT CAST 1 STEP 3X12 (MISCELLANEOUS) ×3 IMPLANT
SUT ETHILON 4-0 (SUTURE) ×2
SUT ETHILON 4-0 FS2 18XMFL BLK (SUTURE) ×1
SUT VICRYL 3-0 27IN (SUTURE) ×3 IMPLANT
SUTURE ETHLN 4-0 FS2 18XMF BLK (SUTURE) ×1 IMPLANT
SYR 3ML LL SCALE MARK (SYRINGE) ×3 IMPLANT

## 2018-02-14 NOTE — Progress Notes (Signed)
PT. Has had bilateral mastectomies , left breast removed 40 year ago , okay to place IV on left side .

## 2018-02-14 NOTE — Anesthesia Procedure Notes (Signed)
Procedure Name: LMA Insertion Performed by: Yakir Wenke, CRNA Pre-anesthesia Checklist: Patient identified, Patient being monitored, Timeout performed, Emergency Drugs available and Suction available Patient Re-evaluated:Patient Re-evaluated prior to induction Oxygen Delivery Method: Circle system utilized Preoxygenation: Pre-oxygenation with 100% oxygen Induction Type: IV induction Ventilation: Mask ventilation without difficulty LMA: LMA inserted LMA Size: 3.5 Tube type: Oral Number of attempts: 1 Placement Confirmation: positive ETCO2 and breath sounds checked- equal and bilateral Tube secured with: Tape Dental Injury: Teeth and Oropharynx as per pre-operative assessment        

## 2018-02-14 NOTE — H&P (Signed)
Reviewed paper H+P, will be scanned into chart. No changes noted.  

## 2018-02-14 NOTE — Progress Notes (Signed)
Dr. Ola Spurr notified that pt pain 9/10 after 1.5mg  of Dilaudid. Pt also given 150 mcg Fentanyl. Dr. Ola Spurr came and saw pt and wanted to notify Dr. Rudene Christians of pt pain and coloration of fingers. Dr. Rudene Christians notified and he stated pt fingers were like that pre-op and that we could go ahead and loosen Ace wrap along with giving 15mg  Toradol. Elizabeth Novak E 5:05 PM 02/14/2018

## 2018-02-14 NOTE — Progress Notes (Signed)
Dr. Rudene Christians notified pt. Pain level 6/10 after fentanyl and dilaudid , pt. Admitted outpatient with extended recovery.

## 2018-02-14 NOTE — Anesthesia Post-op Follow-up Note (Signed)
Anesthesia QCDR form completed.        

## 2018-02-14 NOTE — Anesthesia Preprocedure Evaluation (Signed)
Anesthesia Evaluation  Patient identified by MRN, date of birth, ID band Patient awake    Reviewed: Allergy & Precautions, H&P , NPO status , Patient's Chart, lab work & pertinent test results, reviewed documented beta blocker date and time   Airway Mallampati: III  TM Distance: >3 FB Neck ROM: full    Dental  (+) Teeth Intact   Pulmonary neg pulmonary ROS,    Pulmonary exam normal        Cardiovascular Exercise Tolerance: Poor +CHF  negative cardio ROS Normal cardiovascular exam+ dysrhythmias Atrial Fibrillation + pacemaker  Rate:Normal  EF 55%   Neuro/Psych TIAnegative neurological ROS  negative psych ROS   GI/Hepatic negative GI ROS, Neg liver ROS, GERD  Medicated,  Endo/Other  negative endocrine ROSdiabetes, Well Controlled, Type 2, Oral Hypoglycemic Agents  Renal/GU negative Renal ROS  negative genitourinary   Musculoskeletal  (+) Arthritis ,   Abdominal   Peds  Hematology negative hematology ROS (+) Blood dyscrasia, anemia ,   Anesthesia Other Findings   Reproductive/Obstetrics negative OB ROS                             Anesthesia Physical Anesthesia Plan  ASA: III  Anesthesia Plan: General LMA   Post-op Pain Management:    Induction:   PONV Risk Score and Plan:   Airway Management Planned:   Additional Equipment:   Intra-op Plan:   Post-operative Plan:   Informed Consent: I have reviewed the patients History and Physical, chart, labs and discussed the procedure including the risks, benefits and alternatives for the proposed anesthesia with the patient or authorized representative who has indicated his/her understanding and acceptance.     Plan Discussed with: CRNA  Anesthesia Plan Comments:         Anesthesia Quick Evaluation

## 2018-02-14 NOTE — Discharge Instructions (Addendum)
AMBULATORY SURGERY  DISCHARGE INSTRUCTIONS   1) The drugs that you were given will stay in your system until tomorrow so for the next 24 hours you should not:  A) Drive an automobile B) Make any legal decisions C) Drink any alcoholic beverage   2) You may resume regular meals tomorrow.  Today it is better to start with liquids and gradually work up to solid foods.  You may eat anything you prefer, but it is better to start with liquids, then soup and crackers, and gradually work up to solid foods.   3) Please notify your doctor immediately if you have any unusual bleeding, trouble breathing, redness and pain at the surgery site, drainage, fever, or pain not relieved by medication. 4)   5) Your post-operative visit with Dr.                                     is: Date:                        Time:    Please call to schedule your post-operative visit.  6) Additional Instructions:       Keep arm elevated as much as possible.  Work fingers as much as you can tolerate.  Loosen Ace wrap if fingers swell.

## 2018-02-14 NOTE — Transfer of Care (Signed)
Immediate Anesthesia Transfer of Care Note  Patient: Elizabeth Novak  Procedure(s) Performed: OPEN REDUCTION INTERNAL FIXATION (ORIF) DISTAL RADIAL FRACTURE (Right )  Patient Location: PACU  Anesthesia Type:General  Level of Consciousness: drowsy  Airway & Oxygen Therapy: Patient Spontanous Breathing and Patient connected to face mask oxygen  Post-op Assessment: Report given to RN and Post -op Vital signs reviewed and stable  Post vital signs: Reviewed and stable  Last Vitals:  Vitals Value Taken Time  BP 142/83 02/14/2018  3:25 PM  Temp    Pulse 62 02/14/2018  3:27 PM  Resp 14 02/14/2018  3:27 PM  SpO2 100 % 02/14/2018  3:27 PM  Vitals shown include unvalidated device data.  Last Pain:  Vitals:   02/14/18 1243  PainSc: 5          Complications: No apparent anesthesia complications

## 2018-02-14 NOTE — OR Nursing (Signed)
Dr Rudene Christians and Dr Andree Elk reviewed labs from today. No new orders at this time

## 2018-02-14 NOTE — Op Note (Signed)
02/14/2018  3:30 PM  PATIENT:  Elizabeth Novak  84 y.o. female  PRE-OPERATIVE DIAGNOSIS:  displaced fracture distal end of right radius extra-articular  POST-OPERATIVE DIAGNOSIS:  displaced fracture distal end of right radius, extra-articular  PROCEDURE:  Procedure(s): OPEN REDUCTION INTERNAL FIXATION (ORIF) DISTAL RADIAL FRACTURE (Right)  SURGEON: Laurene Footman, MD  ASSISTANTS: None  ANESTHESIA:   general  EBL:  Total I/O In: 200 [I.V.:200] Out: 1 [Blood:1]  BLOOD ADMINISTERED:none  DRAINS: none   LOCAL MEDICATIONS USED:  NONE  SPECIMEN:  No Specimen  DISPOSITION OF SPECIMEN:  N/A  COUNTS:  YES  TOURNIQUET: 16 minutes at 250 mmHg  IMPLANTS: [employer] DVR short narrow plate with multiple smooth pegs and screws  DICTATION: .Dragon Dictation patient was brought to the operating room and after adequate general anesthesia was obtained the right arm was prepped and draped you sterile fashion.  After patient identification and timeout procedures were completed, tourniquet was raised.  Incision was made centered over the FCR tendon.  The tendon sheath was incised and the tendon retracted radially with the radial artery and associated veins identified and protected along with the tendon.  The deep fascia was incised and the pronator elevated off the distal fragment and shaft with retractor placed the fracture as well visualized.  Fingertrap traction was applied with 6 and half pounds of traction this helped restore length.  The distal first technique was utilized with a short narrow plate applied in all the distal peg holes filled being certain to stay out of the joint.  310 mm screws were then placed and this gave excellent alignment of the distal radius fracture.  The mini C-arm was used during the procedure to evaluate alignment and after all hardware been placed the traction was removed and the stiff wrist was stable through range of motion.  The tourniquet was  let down and the wound irrigated followed by closure with 3-0 Vicryl subcutaneously and 4's-0 nylon for the skin.  Xeroform 4 x 4 web roll volar splint and Ace wrap applied  PLAN OF CARE: Discharge to home after PACU  PATIENT DISPOSITION:  PACU - hemodynamically stable.

## 2018-02-15 ENCOUNTER — Encounter: Payer: Self-pay | Admitting: Orthopedic Surgery

## 2018-02-15 DIAGNOSIS — S52551A Other extraarticular fracture of lower end of right radius, initial encounter for closed fracture: Secondary | ICD-10-CM | POA: Diagnosis not present

## 2018-02-15 LAB — PROTIME-INR
INR: 1.45
Prothrombin Time: 17.5 seconds — ABNORMAL HIGH (ref 11.4–15.2)

## 2018-02-15 NOTE — Care Management Obs Status (Signed)
[place] NOTIFICATION   Patient Details  Name: Elizabeth Novak MRN: [id number] Date of Birth: 1923/07/26   Medicare Observation Status Notification Given:  No(discharge order withint 24 hours)    Marshell Garfinkel, RN 02/15/2018, 11:11 AM

## 2018-02-15 NOTE — Progress Notes (Signed)
   Subjective: 1 Day Post-Op Procedure(s) (LRB): OPEN REDUCTION INTERNAL FIXATION (ORIF) DISTAL RADIAL FRACTURE (Right) Patient reports pain as 4 on 0-10 scale. Only with movement Patient is well, and has had no acute complaints or problems Denies any CP, SOB, ABD pain. We will continue therapy today.  Plan is to go Home after hospital stay.  Objective: Vital signs in last 24 hours: Temp:  [96.8 F (36 C)-98 F (36.7 C)] 97.8 F (36.6 C) (11/12 0351) Pulse Rate:  [58-73] 66 (11/12 0351) Resp:  [10-26] 18 (11/12 0351) BP: (100-155)/(56-90) 118/58 (11/12 0351) SpO2:  [91 %-100 %] 99 % (11/12 0351) Weight:  [49.4 kg] 49.4 kg (11/11 1243)  Intake/Output from previous day: 11/11 0701 - 11/12 0700 In: 1063.1 [I.V.:1013.1; IV Piggyback:50] Out: 1 [Blood:1] Intake/Output this shift: No intake/output data recorded.  No results for input(s): HGB in the last 72 hours. No results for input(s): WBC, RBC, HCT, PLT in the last 72 hours. No results for input(s): NA, K, CL, CO2, BUN, CREATININE, GLUCOSE, CALCIUM in the last 72 hours. Recent Labs    02/14/18 1304 02/15/18 0505  INR 1.53 1.45    EXAM General - Patient is Alert, Appropriate and Oriented Extremity - Neurovascular intact Sensation intact distally 2 + cap refill. mild sensation loss in tip of thumb  Full composite fist Minimal swelling Dressing - dressing C/D/I and no drainage   Past Medical History:  Diagnosis Date  . A-fib ([place])   . Anemia   . Arthritis   . Breast cancer ([place]) 1978   left breast with lymph node removal  . CHF (congestive heart failure) ([place])   . Diabetes mellitus without complication ([place])   . Diverticulitis   . Dysrhythmia   . GERD (gastroesophageal reflux disease)   . Hyperlipidemia   . Macular degeneration of both eyes   . Mitral valve prolapse   . Mitral valve regurgitation   . Presence of permanent cardiac pacemaker     Assessment/Plan:   1 Day Post-Op Procedure(s) (LRB): OPEN  REDUCTION INTERNAL FIXATION (ORIF) DISTAL RADIAL FRACTURE (Right) Active Problems:   Radius and ulna distal fracture  Estimated body mass index is 18.69 kg/m as calculated from the following:   Height as of this encounter: 5\' 4"  (1.626 m).   Weight as of this encounter: 49.4 kg.  Plan on discharge to home today Follow up with [place] [facility] in 2-3 days for recheck/dressing change     T. Rachelle Hora, PA-C [place] 02/15/2018, 7:28 AM

## 2018-02-15 NOTE — Care Management Note (Addendum)
Case Management Note  Patient Details  Name: Elizabeth Novak MRN: [id number] Date of Birth: [birth year]  Subjective/Objective:                  RNCM met with patient to offer choice in home health agencies. She has used [facility] in the past and would like to use them again. Per PT patient will need a small base quad cane.Son will stay with patient for the week. Action/Plan:  Referral to [facility] for OT/PT/NA and patient's son will pick up quad cane and bedside commode  [facility].   Expected Discharge Date:  02/15/18               Expected Discharge Plan:     In-House Referral:     Discharge planning Services  CM Consult  Post Acute Care Choice:  Durable Medical Equipment, Home Health Choice offered to:  Patient  DME Arranged:  (small base quad cane) DME Agency:  [place]:  OT, PT, Nurse's Aide [place] Agency:  [facility] (formerly [facility]  Status of Service:  Completed, signed off  If discussed at [employer], dates discussed:    Additional Comments:  Marshell Garfinkel, RN 02/15/2018, 1:02 PM

## 2018-02-15 NOTE — Progress Notes (Signed)
Pt's son to pick up small quad cane and bsc from [facility].

## 2018-02-15 NOTE — Progress Notes (Signed)
Discharge summary reviewed with verbal understanding. 1 Rx given upon discharge. Belongings packed and transported to personal vehicle via wc.

## 2018-02-15 NOTE — Evaluation (Signed)
Physical Therapy Evaluation Patient Details Name: Elizabeth Novak MRN: [id number] DOB: 10/22/1923 Today's Date: 02/15/2018   History of Present Illness  Pt is a 84 y.o. female s/p ORIF distal radial fx R 02/14/18.  PMH includes B mastectomies, CHF, a-fib, pacemaker, and anemia.    Clinical Impression  Prior to hospital admission, pt was ambulatory with [facility]  Pt lives alone in 1 level home with 3 steps to enter with R railing.  Currently pt is modified independent with bed mobility; independent with transfers; and ambulatory up to 140 feet with AD.  PT initially recommending R platform RW for home discharge but pt's nurse voicing concerns that pt could not use in her home (too big for pt's home set-up); discussed with pt re-arranging furniture in home but pt reporting R platform RW would not work for what she needed to do at home and wanted to trial a different assistive device.  Pt trialed with small based quad cane use (decreased cadence noted compared to R platform RW but pt still appearing steady and safe; pt declined to walk further than 40 feet with [facility] but pt reporting feeling confident about using it).  Pt appearing tangential and self-limiting at times during session requiring encouragement.  Pt reporting mild dizziness during session but no noted BP concerns; nurse notified of pt's reports of dizziness.  Pt would benefit from skilled PT to address noted impairments and functional limitations (see below for any additional details).  Upon hospital discharge, recommend pt discharge to home with [facility] use and initial SBA with functional mobility for safety (pt's son plans to stay with pt initially).      Follow Up Recommendations Home health PT;Supervision for mobility/OOB    Equipment Recommendations  Other (comment)(small based quad cane)    Recommendations for Other Services OT consult     Precautions / Restrictions Precautions Precautions: Fall Restrictions Weight Bearing  Restrictions: Yes RUE Weight Bearing: Weight bear through elbow only      Mobility  Bed Mobility Overal bed mobility: Modified Independent             General bed mobility comments: Semi-supine to/from sit without any noted difficulties (pt reports sleeping on couch on 2 pillows so never lays flat)  Transfers Overall transfer level: Needs assistance Equipment used: None Transfers: Sit to/from Stand Sit to Stand: Independent         General transfer comment: mild increased effort to stand without AD initially but steady (improved efficiency and ease noted with multiple transfers performed during session)  Ambulation/Gait Ambulation/Gait assistance: Min guard;Supervision Gait Distance (Feet): (100 feet with SPC; 140 feet with R platform RW; 40 feet with SBQC; 10 feet with hemi-walker) Assistive device: Right platform walker;Straight cane;Quad cane;Hemi-walker       General Gait Details: CGA ambulating with SPC (pt appearing mildly unsteady but no loss of balance noted);  pt appearing steady ambulating with R platform RW; pt steady ambulating with SBQC (increased cadence compared to [facility] but decreased cadence noted compared to R platform RW); steady with hemi-walker but pt did not like how heavy hemi-walker was to use  Stairs Stairs: Yes Stairs assistance: Min guard Stair Management: One rail Right;Step to pattern;Sideways;Forwards Number of Stairs: 4 General stair comments: sidestepping ascending and forwards descending with L UE on railing; steady and strong stairs navigation noted  Wheelchair Mobility    Modified Rankin (Stroke Patients Only)       Balance Overall balance assessment: Needs assistance Sitting-balance support: No upper extremity  supported;Feet supported Sitting balance-Leahy Scale: Normal Sitting balance - Comments: steady sitting reaching outside BOS   Standing balance support: No upper extremity supported Standing balance-Leahy Scale:  Good Standing balance comment: steady standing reaching within BOS                             Pertinent Vitals/Pain Pain Assessment: 0-10 Pain Score: 4  Pain Location: R wrist Pain Descriptors / Indicators: Sore Pain Intervention(s): Limited activity within patient's tolerance;Monitored during session;Premedicated before session;Repositioned  Vitals stable and WFL throughout treatment session.    Home Living Family/patient expects to be discharged to:: Private residence Living Arrangements: Alone Available Help at Discharge: Family Type of Home: House Home Access: Stairs to enter Entrance Stairs-Rails: Right Entrance Stairs-Number of Steps: 3 Home Layout: One level Home Equipment: Walker - 2 wheels;Grab bars - tub/shower;Cane - single point;Hand held shower head;Other (comment)(shower stool)      Prior Function Level of Independence: Independent with assistive device(s)         Comments: Pt reports ambulating usually with SPC.  Pt reports fall about 1 week ago on a Monday (pt did not describe how she fell when asked and was tangential)     Hand Dominance        Extremity/Trunk Assessment   Upper Extremity Assessment Upper Extremity Assessment: RUE deficits/detail(R wrist not assessed s/p surgery but R elbow and shoulder flexion AROM WFL; L UE WFL)    Lower Extremity Assessment Lower Extremity Assessment: RLE deficits/detail;LLE deficits/detail RLE Deficits / Details: 4/5 hip flexion, knee flexion/extension, and DF LLE Deficits / Details: 4/5 hip flexion, knee flexion/extension, and DF    Cervical / Trunk Assessment Cervical / Trunk Assessment: Normal  Communication   Communication: HOH  Cognition Arousal/Alertness: Awake/alert   Overall Cognitive Status: Within Functional Limits for tasks assessed                                 General Comments: Pt appearing self-limiting requiring some encouragement at times.      General  Comments General comments (skin integrity, edema, etc.): R wrist splint in place; bruise noted R hip (pt reports from fall about 1 week ago).  Pt agreeable to PT session.    Exercises  Gait training with multiple AD; home safety education; fall precautions with mobility   Assessment/Plan    PT Assessment Patient needs continued PT services  PT Problem List Decreased strength;Decreased activity tolerance;Decreased balance;Decreased mobility;Decreased knowledge of use of DME;Decreased knowledge of precautions;Pain       PT Treatment Interventions      PT Goals (Current goals can be found in the Care Plan section)  Acute Rehab PT Goals Patient Stated Goal: to improve pain and mobility PT Goal Formulation: With patient Time For Goal Achievement: 03/01/18 Potential to Achieve Goals: Good    Frequency 7X/week   Barriers to discharge        Co-evaluation               AM-PAC PT "6 Clicks" Daily Activity  Outcome Measure Difficulty turning over in bed (including adjusting bedclothes, sheets and blankets)?: None Difficulty moving from lying on back to sitting on the side of the bed? : None Difficulty sitting down on and standing up from a chair with arms (e.g., wheelchair, bedside commode, etc,.)?: A Little Help needed moving to and from a bed to chair (  including a wheelchair)?: None Help needed walking in hospital room?: A Little Help needed climbing 3-5 steps with a railing? : A Little 6 Click Score: 21    End of Session Equipment Utilized During Treatment: Gait belt Activity Tolerance: Patient tolerated treatment well Patient left: in bed;with call bell/phone within reach;with bed alarm set Nurse Communication: Mobility status;Precautions;Weight bearing status PT Visit Diagnosis: Other abnormalities of gait and mobility (R26.89);Muscle weakness (generalized) (M62.81);History of falling (Z91.81);Difficulty in walking, not elsewhere classified (R26.2);Pain Pain -  Right/Left: Right Pain - part of body: Arm    Time: 1103(15 minutes spent outside room on DME and nursing communication)-1225 PT Time Calculation (min) (ACUTE ONLY): 82 min   Charges:   PT Evaluation $PT Eval Low Complexity: 1 Low PT Treatments $Gait Training: 23-37 mins $Therapeutic Activity: 8-22 mins       Hendricks Limes, PT 02/15/18, 3:08 PM [phone]

## 2018-02-17 NOTE — Anesthesia Postprocedure Evaluation (Signed)
Anesthesia Post Note  Patient: Elizabeth Novak  Procedure(s) Performed: OPEN REDUCTION INTERNAL FIXATION (ORIF) DISTAL RADIAL FRACTURE (Right )  Patient location during evaluation: PACU Anesthesia Type: General Level of consciousness: awake and alert Pain management: pain level controlled Vital Signs Assessment: post-procedure vital signs reviewed and stable Respiratory status: spontaneous breathing, nonlabored ventilation, respiratory function stable and patient connected to nasal cannula oxygen Cardiovascular status: blood pressure returned to baseline and stable Postop Assessment: no apparent nausea or vomiting Anesthetic complications: no     Last Vitals:  Vitals:   02/15/18 0351 02/15/18 0823  BP: (!) 118/58 (!) 111/51  Pulse: 66 (!) 59  Resp: 18 16  Temp: 36.6 C 36.9 C  SpO2: 99% 100%    Last Pain:  Vitals:   02/15/18 0955  TempSrc:   PainSc: [place]

## 2018-04-04 DIAGNOSIS — R490 Dysphonia: Secondary | ICD-10-CM | POA: Insufficient documentation

## 2018-04-18 DIAGNOSIS — I2510 Atherosclerotic heart disease of native coronary artery without angina pectoris: Secondary | ICD-10-CM | POA: Insufficient documentation

## 2018-04-18 DIAGNOSIS — Z950 Presence of cardiac pacemaker: Secondary | ICD-10-CM | POA: Insufficient documentation

## 2018-04-25 ENCOUNTER — Encounter
Admission: RE | Admit: 2018-04-25 | Discharge: 2018-04-25 | Disposition: A | Discharge status: Home / Self Care | Payer: Medicare Other | Source: Ambulatory Visit | Attending: Internal Medicine | Admitting: Internal Medicine

## 2018-04-26 ENCOUNTER — Non-Acute Institutional Stay (SKILLED_NURSING_FACILITY): Payer: Medicare Other | Admitting: Adult Health

## 2018-04-26 ENCOUNTER — Encounter: Payer: Self-pay | Admitting: Adult Health

## 2018-04-26 ENCOUNTER — Other Ambulatory Visit: Payer: Self-pay | Admitting: Adult Health

## 2018-04-26 DIAGNOSIS — E785 Hyperlipidemia, unspecified: Secondary | ICD-10-CM

## 2018-04-26 DIAGNOSIS — D591 Other autoimmune hemolytic anemias: Secondary | ICD-10-CM | POA: Diagnosis not present

## 2018-04-26 DIAGNOSIS — I63512 Cerebral infarction due to unspecified occlusion or stenosis of left middle cerebral artery: Secondary | ICD-10-CM

## 2018-04-26 DIAGNOSIS — I69391 Dysphagia following cerebral infarction: Secondary | ICD-10-CM

## 2018-04-26 DIAGNOSIS — I5033 Acute on chronic diastolic (congestive) heart failure: Secondary | ICD-10-CM

## 2018-04-26 DIAGNOSIS — D5919 Other autoimmune hemolytic anemia: Secondary | ICD-10-CM

## 2018-04-26 DIAGNOSIS — R471 Dysarthria and anarthria: Secondary | ICD-10-CM

## 2018-04-26 DIAGNOSIS — Z794 Long term (current) use of insulin: Secondary | ICD-10-CM

## 2018-04-26 DIAGNOSIS — E1159 Type 2 diabetes mellitus with other circulatory complications: Secondary | ICD-10-CM

## 2018-04-26 DIAGNOSIS — E11610 Type 2 diabetes mellitus with diabetic neuropathic arthropathy: Secondary | ICD-10-CM | POA: Insufficient documentation

## 2018-04-26 DIAGNOSIS — I639 Cerebral infarction, unspecified: Secondary | ICD-10-CM

## 2018-04-26 DIAGNOSIS — F418 Other specified anxiety disorders: Secondary | ICD-10-CM

## 2018-04-26 DIAGNOSIS — D5911 Warm autoimmune hemolytic anemia: Secondary | ICD-10-CM | POA: Insufficient documentation

## 2018-04-26 DIAGNOSIS — E1169 Type 2 diabetes mellitus with other specified complication: Secondary | ICD-10-CM

## 2018-04-26 DIAGNOSIS — I480 Paroxysmal atrial fibrillation: Secondary | ICD-10-CM

## 2018-04-26 DIAGNOSIS — I63511 Cerebral infarction due to unspecified occlusion or stenosis of right middle cerebral artery: Secondary | ICD-10-CM | POA: Insufficient documentation

## 2018-04-26 DIAGNOSIS — I10 Essential (primary) hypertension: Secondary | ICD-10-CM

## 2018-04-26 MED ORDER — ALPRAZOLAM 0.25 MG PO TABS: 0.2500 mg | ORAL_TABLET | Freq: Three times a day (TID) | ORAL | 0 refills | Status: DC | PRN

## 2018-04-26 NOTE — Progress Notes (Signed)
Location:   [facility] Room Number: [place] of Service:  SNF (31)   CODE STATUS: DNR  Allergies  Allergen Reactions  . Codeine Nausea Only  . Disopyramide     Other reaction(s): Unknown  . Ibuprofen Diarrhea  . Iodine     blisters  . Metformin And Related   . Norpace [Disopyramide Phosphate]   . Quinidine     Other reaction(s): Unknown  . Terfenadine     Other reaction(s): Unknown  . Topiramate     Other reaction(s): Other (See Comments) Hair loss  . Verapamil     Other reaction(s): Unknown    Chief Complaint  Patient presents with  . Hospitalization Follow-up    Hospital Follow up    HPI:  She is a 85 year old woman who has been hospitalized from 04-18-18 through 04-25-18 for an acute ischemic left MCA stoke. She has a history of hypertension; afib; diabetes; cad; chf SSS s/p pace maker and breast cancer. She had right sides weakness and expressive aphasia. She had a failed thrombectomy. She continues to have expressive aphasia and slight right side weakness with a facial droop. She is here for short term rehab with her goal to return back home. She does have palpitations; shortness of breath and anxiety especially at night. She will continue to be followed for her chronic illnesses including: diabetes; hypertension; anemia   Past Medical History:  Diagnosis Date  . A-fib ([place])   . Anemia   . Arthritis   . Breast cancer ([place]) 1978   left breast with lymph node removal  . CHF (congestive heart failure) ([place])   . Diabetes mellitus without complication ([place])   . Diverticulitis   . Dysrhythmia   . GERD (gastroesophageal reflux disease)   . Hyperlipidemia   . Macular degeneration of both eyes   . Mitral valve prolapse   . Mitral valve regurgitation   . Presence of permanent cardiac pacemaker     Past Surgical History:  Procedure Laterality Date  . ABDOMINAL HYSTERECTOMY    . APPENDECTOMY    . BREAST SURGERY    . CARDIAC  CATHETERIZATION    . ESOPHAGEAL DILATION    . EYE SURGERY Bilateral    Cataract Extraction with IOL  . INSERT / REPLACE / REMOVE PACEMAKER    . IRRIGATION AND DEBRIDEMENT HEMATOMA Left 08/21/2015   Procedure: IRRIGATION AND DEBRIDEMENT HEMATOMA;  Surgeon: Robert Bellow, MD;  Location: [facility] ORS;  Service: General;  Laterality: Left;  . KNEE ARTHROSCOPY Right   . LEFT OOPHORECTOMY Left   . MASTECTOMY Left 1978  . MASTECTOMY Right 1978  . OPEN REDUCTION INTERNAL FIXATION (ORIF) DISTAL RADIAL FRACTURE Right 02/14/2018   Procedure: OPEN REDUCTION INTERNAL FIXATION (ORIF) DISTAL RADIAL FRACTURE;  Surgeon: Hessie Knows, MD;  Location: [facility] ORS;  Service: Orthopedics;  Laterality: Right;  . PACEMAKER INSERTION  08/11/12  . TEMPORAL ARTERY BIOPSY / LIGATION    . TONSILLECTOMY      Social History   Socioeconomic History  . Marital status: Widowed    Spouse name: Not on file  . Number of children: 1  . Years of education: 90  . Highest education level: 11th grade  Occupational History  . Not on file  Social Needs  . Financial resource strain: Not hard at all  . Food insecurity:    Worry: Never true    Inability: Never true  . Transportation needs:    Medical: No  Non-medical: No  Tobacco Use  . Smoking status: Never Smoker  . Smokeless tobacco: Never Used  Substance and Sexual Activity  . Alcohol use: No    Alcohol/week: 0.0 standard drinks  . Drug use: No  . Sexual activity: Not on file  Lifestyle  . Physical activity:    Days per week: 7 days    Minutes per session: 10 min  . Stress: Not at all  Relationships  . Social connections:    Talks on phone: More than three times a week    Gets together: More than three times a week    Attends religious service: More than 4 times per year    Active member of club or organization: No    Attends meetings of clubs or organizations: Never    Relationship status: Widowed  . Intimate partner violence:    Fear of current or ex  partner: No    Emotionally abused: No    Physically abused: No    Forced sexual activity: No  Other Topics Concern  . Not on file  Social History Narrative   Widowed   1 son   Never smoker or smokeless tobacco user   No alcohol use   Full Code   Family History  Problem Relation Age of Onset  . Hypertension Mother       VITAL SIGNS BP (!) 128/52   Pulse 92   Temp 98 F (36.7 C)   Resp 18   Ht 5\' 4"  (1.626 m)   Wt 105 lb 3.2 oz (47.7 kg)   SpO2 100%   BMI 18.06 kg/m   Outpatient Encounter Medications as of 04/26/2018  Medication Sig  . apixaban (ELIQUIS) 2.5 MG TABS tablet Take 2.5 mg by mouth 2 (two) times daily.  Marland Kitchen atorvastatin (LIPITOR) 20 MG tablet Take 20 mg by mouth at bedtime.  Derrill Memo ON 04/27/2018] digoxin (LANOXIN) 0.125 MG tablet Take 0.125 mg by mouth every other day. CHF- * Hold for HR < 60  . glimepiride (AMARYL) 2 MG tablet Take 2 mg by mouth 2 (two) times daily.   . metolazone (ZAROXOLYN) 2.5 MG tablet Take 2.5 mg by mouth once a week. 1 tablet on Sunday  . metoprolol succinate (TOPROL-XL) 50 MG 24 hr tablet Take 50 mg by mouth daily.   . Multiple Vitamins-Minerals (PRESERVISION AREDS 2 PO) Take 1 tablet by mouth 2 (two) times daily.  . mycophenolate (CELLCEPT) 500 MG tablet Take 500 mg by mouth 2 (two) times daily.  . NON FORMULARY Diet Type: Mechanical soft chopped, nectar thick liquids, NCS  . potassium chloride (K-DUR,KLOR-CON) 10 MEQ tablet Take 40 mEq by mouth daily.   . sitaGLIPtin (JANUVIA) 50 MG tablet Take 50 mg by mouth daily.  Marland Kitchen torsemide (DEMADEX) 20 MG tablet Take 20 mg by mouth daily.   Marland Kitchen UNABLE TO FIND Diet Type: NAS/NCS   No facility-administered encounter medications on file as of 04/26/2018.      SIGNIFICANT DIAGNOSTIC EXAMS  TODAY:   04-18-18: ct of head: NAICP, small vessel disease present with possibly increased hypodensity L MCA territory.   04-20-18: TEE: normal left ventricular systolic function with mild LVH   04-20-18:  thyroid ultrasound: nodule 2.7 cm right mid thyroid; nodule: 1.3 cm right inferior; nodule: 0.6 cm right inferior; nodule: 0.8 cm left superior  04-21-18: ct of abdomen and pelvis: 1. No CT etiology for acute anemia identified. 2. hypo enhancement of the inferior pole of the left kidney could reflect  pyelonephritis versus infarction in the correct clinical setting. 3. Cardiomegaly with moderate right and small left pleural effusion as well as pulmonary edema.     LABS REVIEWED PREVIOUS:   11-10-17: wbc 3.8; hgb 11.4; hct 33.9; mcv 90.8; plt 248; INR 1.09;  11-12-17 INR 1.95  TODAY:    04-19-18: hgb a1c 4.2; chol 112; LDL 56   Review of Systems  Constitutional: Negative for malaise/fatigue.  Respiratory: Positive for shortness of breath. Negative for cough.   Cardiovascular: Positive for palpitations. Negative for chest pain and leg swelling.  Gastrointestinal: Negative for abdominal pain and heartburn.  Musculoskeletal: Negative for back pain, joint pain and myalgias.  Skin: Negative.   Psychiatric/Behavioral: The patient is nervous/anxious.        Palpitations; shortness of breath worse at night    Physical Exam Constitutional:      General: She is not in acute distress.    Appearance: She is well-developed. She is not diaphoretic.     Comments: Frail   Neck:     Musculoskeletal: Neck supple.     Comments: Thyroid is nodular nontender Cardiovascular:     Rate and Rhythm: Normal rate and regular rhythm.     Pulses: Normal pulses.     Heart sounds: Normal heart sounds.     Comments: Has pace maker  Pulmonary:     Effort: Pulmonary effort is normal. No respiratory distress.     Breath sounds: Normal breath sounds.  Abdominal:     General: Bowel sounds are normal. There is no distension.     Palpations: Abdomen is soft.     Tenderness: There is no abdominal tenderness.  Musculoskeletal:     Right lower leg: No edema.     Left lower leg: No edema.     Comments: Has right side  weakness Full strength left side extremities   Lymphadenopathy:     Cervical: No cervical adenopathy.  Skin:    General: Skin is warm and dry.  Neurological:     Mental Status: She is alert and oriented to person, place, and time.     Comments: Has mild right face droop   Psychiatric:        Mood and Affect: Mood normal.      ASSESSMENT/ PLAN:  TODAY;   1. 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 Dyslipidemia associated with type 2 diabetes mellitus: is stable LDL 56 will continue lipitor 20 mg daily for secondary prevention  3. Autoimmune hemolytic anemia: is stable will continue cellcept 500 mg twice daily   4. Acute on chronic diastolic heart failure: is stable will continue demadex 20 mg daily with k+ 40 meq daily; takes zaroxolyn 2.5 mg on Sundays; will continue toprol xl 50 mg daily   5.  Hypertension associated with diabetes; is stable b/p 128/52 will continue toprol xl 50 mg daily   6. Paroxysmal atrial fibrillation:  Heart rate stable: will continue toprol xl 50 mg daily and digoxin 0.125 mg every other day for rate control and eliquis 2.5 mg twice daily   7. Acute ischemic left MCA stroke with dysarthria: is neurologically stable will continue therapy as directed and will continue eliquis 2.5 mg twice daily   8. Dysphagia due to recent cerebrovascular accident: no signs of aspiration present; will continue nectar thick  liquids.   9. Anxiety with depression: is having panic type attacks: will begin xanax 0.25 mg every 8 hours as needed for 2 weeks.    Will check cbc; cmp  Dig level      MD is aware of resident's narcotic use and is in agreement with current plan of care. We will attempt to wean resident as apropriate   Ok Edwards NP [facility]  Contact [phone] Monday through  Friday 8am- 5pm  After hours call [phone]

## 2018-04-27 ENCOUNTER — Other Ambulatory Visit
Admission: RE | Admit: 2018-04-27 | Discharge: 2018-04-27 | Disposition: A | Discharge status: Home / Self Care | Payer: Medicare Other | Source: Ambulatory Visit | Attending: Adult Health | Admitting: Adult Health

## 2018-04-27 DIAGNOSIS — I110 Hypertensive heart disease with heart failure: Secondary | ICD-10-CM | POA: Diagnosis present

## 2018-04-27 LAB — COMPREHENSIVE METABOLIC PANEL
ALT: 22 U/L (ref 0–44)
AST: 32 U/L (ref 15–41)
Albumin: 3.3 g/dL — ABNORMAL LOW (ref 3.5–5.0)
Alkaline Phosphatase: 49 U/L (ref 38–126)
Anion gap: 8 (ref 5–15)
BUN: 15 mg/dL (ref 8–23)
CO2: 27 mmol/L (ref 22–32)
Calcium: 8.9 mg/dL (ref 8.9–10.3)
Chloride: 105 mmol/L (ref 98–111)
Creatinine, Ser: 0.84 mg/dL (ref 0.44–1.00)
GFR calc Af Amer: >60 mL/min (ref >60)
GFR calc non Af Amer: >60 mL/min (ref >60)
Glucose, Bld: 126 mg/dL — ABNORMAL HIGH (ref 70–99)
Potassium: 3.6 mmol/L (ref 3.5–5.1)
Sodium: 140 mmol/L (ref 135–145)
Total Bilirubin: 1.4 mg/dL — ABNORMAL HIGH (ref 0.3–1.2)
Total Protein: 5.9 g/dL — ABNORMAL LOW (ref 6.5–8.1)

## 2018-04-27 LAB — CBC WITH DIFFERENTIAL/PLATELET
Abs Immature Granulocytes: 0.02 10*3/uL (ref 0.00–0.07)
Basophils Absolute: 0 10*3/uL (ref 0.0–0.1)
Basophils Relative: 1 %
Eosinophils Absolute: 0.2 10*3/uL (ref 0.0–0.5)
Eosinophils Relative: 3 %
HCT: 24 % — ABNORMAL LOW (ref 36.0–46.0)
Hemoglobin: 8.3 g/dL — ABNORMAL LOW (ref 12.0–15.0)
Immature Granulocytes: 0 %
Lymphocytes Relative: 27 %
Lymphs Abs: 1.5 10*3/uL (ref 0.7–4.0)
MCH: 37.2 pg — ABNORMAL HIGH (ref 26.0–34.0)
MCHC: 34.6 g/dL (ref 30.0–36.0)
MCV: 107.6 fL — ABNORMAL HIGH (ref 80.0–100.0)
Monocytes Absolute: 0.7 10*3/uL (ref 0.1–1.0)
Monocytes Relative: 12 %
Neutro Abs: 3.1 10*3/uL (ref 1.7–7.7)
Neutrophils Relative %: 57 %
Platelets: 333 10*3/uL (ref 150–400)
RBC: 2.23 MIL/uL — ABNORMAL LOW (ref 3.87–5.11)
RDW: 23.4 % — ABNORMAL HIGH (ref 11.5–15.5)
WBC: 5.4 10*3/uL (ref 4.0–10.5)
nRBC: 0 % (ref 0.0–0.2)

## 2018-04-27 LAB — DIGOXIN LEVEL: Digoxin Level: 0.5 ng/mL — ABNORMAL LOW (ref 0.8–2.0)

## 2018-04-28 ENCOUNTER — Other Ambulatory Visit
Admission: RE | Admit: 2018-04-28 | Discharge: 2018-04-28 | Disposition: A | Discharge status: Home / Self Care | Payer: Medicare Other | Source: Ambulatory Visit | Attending: Adult Health | Admitting: Adult Health

## 2018-04-28 DIAGNOSIS — D649 Anemia, unspecified: Secondary | ICD-10-CM | POA: Diagnosis present

## 2018-04-28 LAB — HEPATIC FUNCTION PANEL
ALT: 21 U/L (ref 0–44)
AST: 33 U/L (ref 15–41)
Albumin: 3.6 g/dL (ref 3.5–5.0)
Alkaline Phosphatase: 52 U/L (ref 38–126)
Bilirubin, Direct: 0.4 mg/dL — ABNORMAL HIGH (ref 0.0–0.2)
Indirect Bilirubin: 1.2 mg/dL — ABNORMAL HIGH (ref 0.3–0.9)
Total Bilirubin: 1.6 mg/dL — ABNORMAL HIGH (ref 0.3–1.2)
Total Protein: 6.3 g/dL — ABNORMAL LOW (ref 6.5–8.1)

## 2018-04-28 LAB — CBC WITH DIFFERENTIAL/PLATELET
Abs Immature Granulocytes: 0.03 10*3/uL (ref 0.00–0.07)
Basophils Absolute: 0 10*3/uL (ref 0.0–0.1)
Basophils Relative: 1 %
Eosinophils Absolute: 0.2 10*3/uL (ref 0.0–0.5)
Eosinophils Relative: 3 %
HCT: 25.9 % — ABNORMAL LOW (ref 36.0–46.0)
Hemoglobin: 8.7 g/dL — ABNORMAL LOW (ref 12.0–15.0)
Immature Granulocytes: 1 %
Lymphocytes Relative: 24 %
Lymphs Abs: 1.4 10*3/uL (ref 0.7–4.0)
MCH: 35.8 pg — ABNORMAL HIGH (ref 26.0–34.0)
MCHC: 33.6 g/dL (ref 30.0–36.0)
MCV: 106.6 fL — ABNORMAL HIGH (ref 80.0–100.0)
Monocytes Absolute: 0.6 10*3/uL (ref 0.1–1.0)
Monocytes Relative: 10 %
Neutro Abs: 3.8 10*3/uL (ref 1.7–7.7)
Neutrophils Relative %: 61 %
Platelets: 355 10*3/uL (ref 150–400)
RBC: 2.43 MIL/uL — ABNORMAL LOW (ref 3.87–5.11)
RDW: 22.5 % — ABNORMAL HIGH (ref 11.5–15.5)
Smear Review: NORMAL
WBC: 6 10*3/uL (ref 4.0–10.5)
nRBC: 0 % (ref 0.0–0.2)

## 2018-05-03 ENCOUNTER — Encounter: Payer: Self-pay | Admitting: Adult Health

## 2018-05-03 ENCOUNTER — Non-Acute Institutional Stay (SKILLED_NURSING_FACILITY): Payer: Medicare Other | Admitting: Adult Health

## 2018-05-03 DIAGNOSIS — I5033 Acute on chronic diastolic (congestive) heart failure: Secondary | ICD-10-CM

## 2018-05-03 DIAGNOSIS — E1169 Type 2 diabetes mellitus with other specified complication: Secondary | ICD-10-CM | POA: Diagnosis not present

## 2018-05-03 DIAGNOSIS — D5919 Other autoimmune hemolytic anemia: Secondary | ICD-10-CM

## 2018-05-03 DIAGNOSIS — D591 Other autoimmune hemolytic anemias: Secondary | ICD-10-CM

## 2018-05-03 DIAGNOSIS — E11610 Type 2 diabetes mellitus with diabetic neuropathic arthropathy: Secondary | ICD-10-CM | POA: Diagnosis not present

## 2018-05-03 DIAGNOSIS — E785 Hyperlipidemia, unspecified: Secondary | ICD-10-CM

## 2018-05-03 NOTE — Progress Notes (Signed)
[facility]:   [facility] Room Number: [place] of Service:  SNF (31)   CODE STATUS: DNR  Allergies  Allergen Reactions  . Codeine Nausea Only  . Disopyramide     Other reaction(s): Unknown  . Ibuprofen Diarrhea  . Iodine     blisters  . Metformin And Related   . Norpace [Disopyramide Phosphate]   . Quinidine     Other reaction(s): Unknown  . Terfenadine     Other reaction(s): Unknown  . Topiramate     Other reaction(s): Other (See Comments) Hair loss  . Verapamil     Other reaction(s): Unknown    Chief Complaint  Patient presents with  . Medical Management of Chronic Issues    Acute on chronic diastolic heart failure; dyslipidemia associated with type 2 diabetes mellitus; type 2 diabetes mellitus with diabetic neuropathic arthropathy without long term current use of insulin; other autoimmune hemolytic anemias. Weekly follow up for the first 30 days post hospitalization. Care plan meeting.     HPI:  She is a 85 year old short term rehab patient being seen for the management of her chronic illnesses: heart failure; dyslipidemia; diabetes; anemia. Her anemia is stable. She denies any fatigue; no changes in appetite; no insomnia no axiety.  We have come together for her routine care plan meeting. She continues to work with therapy and is doing well. At this time she will more than likely need assisted living upon discharge.    Past Medical History:  Diagnosis Date  . A-fib ([place])   . Anemia   . Arthritis   . Breast cancer ([place]) 1978   left breast with lymph node removal  . CHF (congestive heart failure) ([place])   . Diabetes mellitus without complication ([place])   . Diverticulitis   . Dysrhythmia   . GERD (gastroesophageal reflux disease)   . Hyperlipidemia   . Macular degeneration of both eyes   . Mitral valve prolapse   . Mitral valve regurgitation   . Presence of permanent cardiac pacemaker     Past Surgical History:  Procedure  Laterality Date  . ABDOMINAL HYSTERECTOMY    . APPENDECTOMY    . BREAST SURGERY    . CARDIAC CATHETERIZATION    . ESOPHAGEAL DILATION    . EYE SURGERY Bilateral    Cataract Extraction with IOL  . INSERT / REPLACE / REMOVE PACEMAKER    . IRRIGATION AND DEBRIDEMENT HEMATOMA Left 08/21/2015   Procedure: IRRIGATION AND DEBRIDEMENT HEMATOMA;  Surgeon: Robert Bellow, MD;  Location: [facility] ORS;  Service: General;  Laterality: Left;  . KNEE ARTHROSCOPY Right   . LEFT OOPHORECTOMY Left   . MASTECTOMY Left 1978  . MASTECTOMY Right 1978  . OPEN REDUCTION INTERNAL FIXATION (ORIF) DISTAL RADIAL FRACTURE Right 02/14/2018   Procedure: OPEN REDUCTION INTERNAL FIXATION (ORIF) DISTAL RADIAL FRACTURE;  Surgeon: Hessie Knows, MD;  Location: [facility] ORS;  Service: Orthopedics;  Laterality: Right;  . PACEMAKER INSERTION  08/11/12  . TEMPORAL ARTERY BIOPSY / LIGATION    . TONSILLECTOMY      Social History   Socioeconomic History  . Marital status: Widowed    Spouse name: Not on file  . Number of children: 1  . Years of education: 42  . Highest education level: 11th grade  Occupational History  . Not on file  Social Needs  . Financial resource strain: Not hard at all  . Food insecurity:    Worry: Never true  Inability: Never true  . Transportation needs:    Medical: No    Non-medical: No  Tobacco Use  . Smoking status: Never Smoker  . Smokeless tobacco: Never Used  Substance and Sexual Activity  . Alcohol use: No    Alcohol/week: 0.0 standard drinks  . Drug use: No  . Sexual activity: Not on file  Lifestyle  . Physical activity:    Days per week: 7 days    Minutes per session: 10 min  . Stress: Not at all  Relationships  . Social connections:    Talks on phone: More than three times a week    Gets together: More than three times a week    Attends religious service: More than 4 times per year    Active member of club or organization: No    Attends meetings of clubs or  organizations: Never    Relationship status: Widowed  . Intimate partner violence:    Fear of current or ex partner: No    Emotionally abused: No    Physically abused: No    Forced sexual activity: No  Other Topics Concern  . Not on file  Social History Narrative   Widowed   1 son   Never smoker or smokeless tobacco user   No alcohol use   Full Code   Family History  Problem Relation Age of Onset  . Hypertension Mother       VITAL SIGNS BP 128/61   Pulse 64   Temp 98 F (36.7 C)   Resp 20   Ht 5\' 4"  (1.626 m)   Wt 97 lb 11.2 oz (44.3 kg)   SpO2 100%   BMI 16.77 kg/m   Outpatient Encounter Medications as of 05/03/2018  Medication Sig  . ALPRAZolam (XANAX) 0.25 MG tablet Take 1 tablet (0.25 mg total) by mouth every 8 (eight) hours as needed for up to 14 days for anxiety.  Marland Kitchen apixaban (ELIQUIS) 2.5 MG TABS tablet Take 2.5 mg by mouth 2 (two) times daily.  Marland Kitchen atorvastatin (LIPITOR) 20 MG tablet Take 20 mg by mouth at bedtime.  . digoxin (LANOXIN) 0.125 MG tablet Take 0.125 mg by mouth every other day. CHF- * Hold for HR < 60  . glimepiride (AMARYL) 2 MG tablet Take 2 mg by mouth 2 (two) times daily.   Marland Kitchen lidocaine (LIDODERM) 5 % Apply 1 patch topically to biggest area of pain at bedtime.  Remove & Discard patch within 12 hours or as directed by MD  . metolazone (ZAROXOLYN) 2.5 MG tablet Take 2.5 mg by mouth once a week. 1 tablet on Sunday  . metoprolol succinate (TOPROL-XL) 50 MG 24 hr tablet Take 50 mg by mouth daily.   . mirtazapine (REMERON) 7.5 MG tablet Take 7.5 mg by mouth at bedtime.  . Multiple Vitamins-Minerals (PRESERVISION AREDS 2 PO) Take 1 tablet by mouth 2 (two) times daily.  . mycophenolate (CELLCEPT) 500 MG tablet Take 500 mg by mouth 2 (two) times daily.  . NON FORMULARY Diet Type: Mechanical soft chopped, nectar thick liquids, NCS  . potassium chloride (K-DUR,KLOR-CON) 10 MEQ tablet Take 40 mEq by mouth daily.   . sitaGLIPtin (JANUVIA) 50 MG tablet Take 50  mg by mouth daily.  Marland Kitchen torsemide (DEMADEX) 20 MG tablet Take 20 mg by mouth daily.   . [DISCONTINUED] UNABLE TO FIND Diet Type: NAS/NCS   No facility-administered encounter medications on file as of 05/03/2018.      SIGNIFICANT DIAGNOSTIC EXAMS  PREVIOUS:  04-18-18: ct of head: NAICP, small vessel disease present with possibly increased hypodensity L MCA territory.   04-20-18: TEE: normal left ventricular systolic function with mild LVH   04-20-18: thyroid ultrasound: nodule 2.7 cm right mid thyroid; nodule: 1.3 cm right inferior; nodule: 0.6 cm right inferior; nodule: 0.8 cm left superior  04-21-18: ct of abdomen and pelvis: 1. No CT etiology for acute anemia identified. 2. hypo enhancement of the inferior pole of the left kidney could reflect pyelonephritis versus infarction in the correct clinical setting. 3. Cardiomegaly with moderate right and small left pleural effusion as well as pulmonary edema.     NO NEW EXAMS  LABS REVIEWED PREVIOUS:   11-10-17: wbc 3.8; hgb 11.4; hct 33.9; mcv 90.8; plt 248; INR 1.09;  11-12-17 INR 1.95 04-19-18: hgb a1c 4.2; chol 112; LDL 56   TODAY;   04-07-18: wbc 5.4;hgb 8.3; hct 24.; mcv 107.6; plt 333 glucose 126; bun 15; creat 0.84; k+ 3.6'; na++ 140; ca 8.9; total bili 1.4; albumin 3.3; dig 0.5 04-2318; wbc 6.0; hgb 8.7; hct 25.9; mcv 106.6; plt 355 total bili 1.6; direct bili 0.4; indirect bili 1.6; albumin 3.6    Review of Systems  Constitutional: Negative for malaise/fatigue.  Respiratory: Negative for cough and shortness of breath.   Cardiovascular: Negative for chest pain, palpitations and leg swelling.  Gastrointestinal: Negative for abdominal pain, constipation and heartburn.  Musculoskeletal: Negative for back pain, joint pain and myalgias.  Skin: Negative.   Neurological: Negative for dizziness.  Psychiatric/Behavioral: The patient is not nervous/anxious.    Physical Exam Constitutional:      General: She is not in acute distress.     Appearance: She is well-developed. She is not diaphoretic.     Comments: Frail   Neck:     Musculoskeletal: Neck supple.     Thyroid: No thyroid tenderness.     Comments: Thyroid is nodular  Cardiovascular:     Rate and Rhythm: Normal rate and regular rhythm.     Pulses: Normal pulses.     Heart sounds: Normal heart sounds.     Comments: Has pace maker  Pulmonary:     Effort: Pulmonary effort is normal. No respiratory distress.     Breath sounds: Normal breath sounds.  Abdominal:     General: Bowel sounds are normal. There is no distension.     Palpations: Abdomen is soft.     Tenderness: There is no abdominal tenderness.  Musculoskeletal:     Right lower leg: No edema.     Left lower leg: No edema.     Comments:  Has right side weakness Full strength left side extremities    Lymphadenopathy:     Cervical: No cervical adenopathy.  Skin:    General: Skin is warm and dry.  Neurological:     Mental Status: She is alert and oriented to person, place, and time.     Comments:  Has mild right face droop    Psychiatric:        Mood and Affect: Mood normal.      ASSESSMENT/ PLAN:  TODAY;   1. Type 2 diabetes mellitus with diabetic neuropathy arthropathy without long term current use of insulin: hgb a1c 4.2  Will hold januvia 50 mg daily and amaryl 2 mg twice daily for cbg >200; will monitor   2. Dyslipidemia associated with type 2 diabetes mellitus: is stable LDL 56 will continue lipitor 20 mg daily for secondary prevention  3. Autoimmune hemolytic anemia:  is stable will continue cellcept 500 mg twice daily   4. Acute on chronic diastolic heart failure: is stable will continue demadex 20 mg daily with k+ 40 meq daily; takes zaroxolyn 2.5 mg on Sundays; will continue toprol xl 50 mg daily   PREVIOUS   5.  Hypertension associated with diabetes; is stable b/p 128/52 will continue toprol xl 50 mg daily   6. Paroxysmal atrial fibrillation:  Heart rate stable: will continue  toprol xl 50 mg daily and digoxin 0.125 mg every other day for rate control and eliquis 2.5 mg twice daily   7. Acute ischemic left MCA stroke with dysarthria: is neurologically stable will continue therapy as directed and will continue eliquis 2.5 mg twice daily   8. Dysphagia due to recent cerebrovascular accident: no signs of aspiration present; will continue nectar thick liquids.   9. Anxiety with depression: is having panic type attacks: will begin xanax 0.25 mg every 8 hours as needed for 2 weeks.   At this time she will need assisted living upon discharge from this facility. She will need a local neurology consult to follow up CVA.   MD is aware of resident's narcotic use and is in agreement with current plan of care. We will attempt to wean resident as apropriate   Ok Edwards NP [facility]  Contact [phone] Monday through Friday 8am- 5pm  After hours call [phone]

## 2018-05-07 ENCOUNTER — Encounter
Admission: RE | Admit: 2018-05-07 | Discharge: 2018-05-07 | Disposition: A | Discharge status: Home / Self Care | Payer: Medicare Other | Source: Ambulatory Visit | Attending: Internal Medicine | Admitting: Internal Medicine

## 2018-05-10 ENCOUNTER — Non-Acute Institutional Stay (SKILLED_NURSING_FACILITY): Payer: Medicare Other | Admitting: Adult Health

## 2018-05-10 ENCOUNTER — Other Ambulatory Visit: Payer: Self-pay | Admitting: Adult Health

## 2018-05-10 ENCOUNTER — Encounter: Payer: Self-pay | Admitting: Adult Health

## 2018-05-10 DIAGNOSIS — I639 Cerebral infarction, unspecified: Secondary | ICD-10-CM

## 2018-05-10 DIAGNOSIS — I69391 Dysphagia following cerebral infarction: Secondary | ICD-10-CM | POA: Diagnosis not present

## 2018-05-10 DIAGNOSIS — I63512 Cerebral infarction due to unspecified occlusion or stenosis of left middle cerebral artery: Secondary | ICD-10-CM

## 2018-05-10 DIAGNOSIS — I5033 Acute on chronic diastolic (congestive) heart failure: Secondary | ICD-10-CM

## 2018-05-10 DIAGNOSIS — R471 Dysarthria and anarthria: Secondary | ICD-10-CM

## 2018-05-10 MED ORDER — GLIMEPIRIDE 2 MG PO TABS: 2.0000 mg | ORAL_TABLET | Freq: Two times a day (BID) | ORAL | 0 refills | Status: DC

## 2018-05-10 MED ORDER — ATORVASTATIN CALCIUM 20 MG PO TABS: 20.0000 mg | ORAL_TABLET | Freq: Every day | ORAL | 0 refills | Status: DC

## 2018-05-10 MED ORDER — POTASSIUM CHLORIDE CRYS ER 10 MEQ PO TBCR: 40.0000 meq | EXTENDED_RELEASE_TABLET | Freq: Every day | ORAL | 0 refills | Status: DC

## 2018-05-10 MED ORDER — METOPROLOL SUCCINATE ER 50 MG PO TB24: 50.0000 mg | ORAL_TABLET | Freq: Every day | ORAL | 0 refills | Status: DC

## 2018-05-10 MED ORDER — MYCOPHENOLATE MOFETIL 500 MG PO TABS: 500.0000 mg | ORAL_TABLET | Freq: Two times a day (BID) | ORAL | 0 refills | Status: DC

## 2018-05-10 MED ORDER — DIGOXIN 125 MCG PO TABS: 0.1250 mg | ORAL_TABLET | ORAL | 0 refills | Status: DC

## 2018-05-10 MED ORDER — MIRTAZAPINE 7.5 MG PO TABS: 7.5000 mg | ORAL_TABLET | Freq: Every day | ORAL | 0 refills | Status: DC

## 2018-05-10 MED ORDER — APIXABAN 2.5 MG PO TABS: 2.5000 mg | ORAL_TABLET | Freq: Two times a day (BID) | ORAL | 0 refills | Status: DC

## 2018-05-10 MED ORDER — METOLAZONE 2.5 MG PO TABS: 2.5000 mg | ORAL_TABLET | ORAL | 0 refills | Status: DC

## 2018-05-10 MED ORDER — SITAGLIPTIN PHOSPHATE 100 MG PO TABS: 100.0000 mg | ORAL_TABLET | Freq: Every day | ORAL | 0 refills | Status: DC

## 2018-05-10 MED ORDER — TORSEMIDE 20 MG PO TABS: 20.0000 mg | ORAL_TABLET | Freq: Every day | ORAL | 0 refills | Status: DC

## 2018-05-10 MED ORDER — LIDOCAINE 5 % EX PTCH: 1.0000 | MEDICATED_PATCH | CUTANEOUS | 0 refills | Status: DC

## 2018-05-10 NOTE — Progress Notes (Signed)
[facility]:   [facility] Room Number: [place] of Service:  SNF (31)    CODE STATUS: DNR  Allergies  Allergen Reactions  . Codeine Nausea Only  . Disopyramide     Other reaction(s): Unknown  . Ibuprofen Diarrhea  . Iodine     blisters  . Metformin And Related   . Norpace [Disopyramide Phosphate]   . Quinidine     Other reaction(s): Unknown  . Terfenadine     Other reaction(s): Unknown  . Topiramate     Other reaction(s): Other (See Comments) Hair loss  . Verapamil     Other reaction(s): Unknown    Chief Complaint  Patient presents with  . Discharge Note    Discharging to [facility] on 05/12/2018    HPI:  She is being discharged to independent living with home health for pt/ot/rn/cna. She will not need any dme. She will need her prescriptions written and will need to follow up with her medical provider. She had been hospitalized for an acute cva. She was admitted to this facility for short term rehab. She has completed her SNF therapy and is ready for discharge. Her care would be more appropriate for assisted living. Her family has been unable to locate her an assisted living facility.    Past Medical History:  Diagnosis Date  . A-fib ([place])   . Anemia   . Arthritis   . Breast cancer ([place]) 1978   left breast with lymph node removal  . CHF (congestive heart failure) ([place])   . Diabetes mellitus without complication ([place])   . Diverticulitis   . Dysrhythmia   . GERD (gastroesophageal reflux disease)   . Hyperlipidemia   . Macular degeneration of both eyes   . Mitral valve prolapse   . Mitral valve regurgitation   . Presence of permanent cardiac pacemaker     Past Surgical History:  Procedure Laterality Date  . ABDOMINAL HYSTERECTOMY    . APPENDECTOMY    . BREAST SURGERY    . CARDIAC CATHETERIZATION    . ESOPHAGEAL DILATION    . EYE SURGERY Bilateral    Cataract Extraction with IOL  . INSERT / REPLACE /  REMOVE PACEMAKER    . IRRIGATION AND DEBRIDEMENT HEMATOMA Left 08/21/2015   Procedure: IRRIGATION AND DEBRIDEMENT HEMATOMA;  Surgeon: Robert Bellow, MD;  Location: [facility] ORS;  Service: General;  Laterality: Left;  . KNEE ARTHROSCOPY Right   . LEFT OOPHORECTOMY Left   . MASTECTOMY Left 1978  . MASTECTOMY Right 1978  . OPEN REDUCTION INTERNAL FIXATION (ORIF) DISTAL RADIAL FRACTURE Right 02/14/2018   Procedure: OPEN REDUCTION INTERNAL FIXATION (ORIF) DISTAL RADIAL FRACTURE;  Surgeon: Hessie Knows, MD;  Location: [facility] ORS;  Service: Orthopedics;  Laterality: Right;  . PACEMAKER INSERTION  08/11/12  . TEMPORAL ARTERY BIOPSY / LIGATION    . TONSILLECTOMY      Social History   Socioeconomic History  . Marital status: Widowed    Spouse name: Not on file  . Number of children: 1  . Years of education: 69  . Highest education level: 11th grade  Occupational History  . Not on file  Social Needs  . Financial resource strain: Not hard at all  . Food insecurity:    Worry: Never true    Inability: Never true  . Transportation needs:    Medical: No    Non-medical: No  Tobacco Use  . Smoking status: Never Smoker  .  Smokeless tobacco: Never Used  Substance and Sexual Activity  . Alcohol use: No    Alcohol/week: 0.0 standard drinks  . Drug use: No  . Sexual activity: Not on file  Lifestyle  . Physical activity:    Days per week: 7 days    Minutes per session: 10 min  . Stress: Not at all  Relationships  . Social connections:    Talks on phone: More than three times a week    Gets together: More than three times a week    Attends religious service: More than 4 times per year    Active member of club or organization: No    Attends meetings of clubs or organizations: Never    Relationship status: Widowed  . Intimate partner violence:    Fear of current or ex partner: No    Emotionally abused: No    Physically abused: No    Forced sexual activity: No  Other Topics Concern  .  Not on file  Social History Narrative   Widowed   1 son   Never smoker or smokeless tobacco user   No alcohol use   Full Code   Family History  Problem Relation Age of Onset  . Hypertension Mother     VITAL SIGNS BP 116/71   Pulse 73   Temp 98.4 F (36.9 C)   Resp 16   Ht 5\' 4"  (1.626 m)   Wt 95 lb 11.2 oz (43.4 kg)   SpO2 98%   BMI 16.43 kg/m   Patient's Medications  New Prescriptions   No medications on file  Previous Medications   ALPRAZOLAM (XANAX) 0.25 MG TABLET    Take 1 tablet (0.25 mg total) by mouth every 8 (eight) hours as needed for up to 14 days for anxiety.   APIXABAN (ELIQUIS) 2.5 MG TABS TABLET    Take 2.5 mg by mouth 2 (two) times daily.   ATORVASTATIN (LIPITOR) 20 MG TABLET    Take 20 mg by mouth at bedtime.   DIGOXIN (LANOXIN) 0.125 MG TABLET    Take 0.125 mg by mouth every other day. CHF- * Hold for HR < 60   GLIMEPIRIDE (AMARYL) 2 MG TABLET    Take 2 mg by mouth 2 (two) times daily.    LIDOCAINE (LIDODERM) 5 %    Apply 1 patch topically to biggest area of pain at bedtime.  Remove & Discard patch within 12 hours or as directed by MD   METOLAZONE (ZAROXOLYN) 2.5 MG TABLET    Take 2.5 mg by mouth once a week. 1 tablet on Sunday   METOPROLOL SUCCINATE (TOPROL-XL) 50 MG 24 HR TABLET    Take 50 mg by mouth daily.    MIRTAZAPINE (REMERON) 7.5 MG TABLET    Take 7.5 mg by mouth at bedtime.   MULTIPLE VITAMINS-MINERALS (PRESERVISION AREDS 2 PO)    Take 1 tablet by mouth 2 (two) times daily.   MYCOPHENOLATE (CELLCEPT) 500 MG TABLET    Take 500 mg by mouth 2 (two) times daily.   NON FORMULARY    Diet Type: Mechanical soft chopped, nectar thick liquids, NCS   POTASSIUM CHLORIDE (K-DUR,KLOR-CON) 10 MEQ TABLET    Take 40 mEq by mouth daily.    SITAGLIPTIN (JANUVIA) 50 MG TABLET    Take 50 mg by mouth daily.   TORSEMIDE (DEMADEX) 20 MG TABLET    Take 20 mg by mouth daily.   Modified Medications   No medications on file  Discontinued  Medications   No medications on  file     SIGNIFICANT DIAGNOSTIC EXAMS  PREVIOUS:   04-18-18: ct of head: NAICP, small vessel disease present with possibly increased hypodensity L MCA territory.   04-20-18: TEE: normal left ventricular systolic function with mild LVH   04-20-18: thyroid ultrasound: nodule 2.7 cm right mid thyroid; nodule: 1.3 cm right inferior; nodule: 0.6 cm right inferior; nodule: 0.8 cm left superior  04-21-18: ct of abdomen and pelvis: 1. No CT etiology for acute anemia identified. 2. hypo enhancement of the inferior pole of the left kidney could reflect pyelonephritis versus infarction in the correct clinical setting. 3. Cardiomegaly with moderate right and small left pleural effusion as well as pulmonary edema.     NO NEW EXAMS  LABS REVIEWED PREVIOUS:   11-10-17: wbc 3.8; hgb 11.4; hct 33.9; mcv 90.8; plt 248; INR 1.09;  11-12-17 INR 1.95 04-19-18: hgb a1c 4.2; chol 112; LDL 56  04-07-18: wbc 5.4;hgb 8.3; hct 24.; mcv 107.6; plt 333 glucose 126; bun 15; creat 0.84; k+ 3.6'; na++ 140; ca 8.9; total bili 1.4; albumin 3.3; dig 0.5 04-2318; wbc 6.0; hgb 8.7; hct 25.9; mcv 106.6; plt 355 total bili 1.6; direct bili 0.4; indirect bili 1.6; albumin 3.6   NO NEW LABS.    Review of Systems  Constitutional: Negative for malaise/fatigue.  Respiratory: Negative for cough and shortness of breath.   Cardiovascular: Negative for chest pain, palpitations and leg swelling.  Gastrointestinal: Negative for abdominal pain, constipation and heartburn.  Musculoskeletal: Negative for back pain, joint pain and myalgias.  Skin: Negative.   Neurological: Negative for dizziness.  Psychiatric/Behavioral: The patient is not nervous/anxious.    Physical Exam Constitutional:      General: She is not in acute distress.    Appearance: She is well-developed. She is not diaphoretic.     Comments: thin  Neck:     Musculoskeletal: Neck supple.     Comments: Thyroid is nodular  Cardiovascular:     Rate and Rhythm: Normal rate  and regular rhythm.     Heart sounds: Normal heart sounds.     Comments: Has pace maker  Pulmonary:     Effort: Pulmonary effort is normal. No respiratory distress.     Breath sounds: Normal breath sounds.  Abdominal:     General: Bowel sounds are normal. There is no distension.     Palpations: Abdomen is soft.     Tenderness: There is no abdominal tenderness.  Musculoskeletal:     Left lower leg: No edema.     Comments: Has right side weakness Full strength left side extremities     Lymphadenopathy:     Cervical: No cervical adenopathy.  Skin:    General: Skin is warm and dry.  Neurological:     Mental Status: She is alert and oriented to person, place, and time.     Comments: Has mild right face droop   Psychiatric:        Mood and Affect: Mood normal.       ASSESSMENT/ PLAN:   Patient is being discharged with the following home health services:  Pt/ot/rn/cna: to evaluate and treat as indicated for gait balance strength adl training medication management and adl care.   Patient is being discharged with the following durable medical equipment:  None needed   Patient has been advised to f/u with their PCP in 1-2 weeks to bring them up to date on their rehab stay.  Social services at facility  was responsible for arranging this appointment.  Pt was provided with a 30 day supply of prescriptions for medications and refills must be obtained from their PCP.  For controlled substances, a more limited supply may be provided adequate until PCP appointment only.   A 30 day supply of her prescription medications have been sent to [facility] pharmacy  Time spent with patient and family 45 minutes: discussed home health needs; placement and medications; verbalized understanding.    Ok Edwards NP [facility]  Contact [phone] Monday through Friday 8am- 5pm  After hours call [phone]

## 2018-05-11 ENCOUNTER — Other Ambulatory Visit: Payer: Self-pay | Admitting: Adult Health

## 2018-06-02 ENCOUNTER — Encounter (INDEPENDENT_AMBULATORY_CARE_PROVIDER_SITE_OTHER): Payer: Self-pay

## 2018-06-02 ENCOUNTER — Inpatient Hospital Stay: Payer: Medicare Other | Attending: Oncology | Admitting: Oncology

## 2018-06-02 ENCOUNTER — Other Ambulatory Visit: Payer: Self-pay | Admitting: *Deleted

## 2018-06-02 ENCOUNTER — Inpatient Hospital Stay: Payer: Medicare Other

## 2018-06-02 VITALS — BP 133/65 | HR 65 | Temp 97.2°F | Wt 103.4 lb

## 2018-06-02 DIAGNOSIS — Z950 Presence of cardiac pacemaker: Secondary | ICD-10-CM | POA: Diagnosis not present

## 2018-06-02 DIAGNOSIS — Z86711 Personal history of pulmonary embolism: Secondary | ICD-10-CM

## 2018-06-02 DIAGNOSIS — Z8673 Personal history of transient ischemic attack (TIA), and cerebral infarction without residual deficits: Secondary | ICD-10-CM | POA: Diagnosis not present

## 2018-06-02 DIAGNOSIS — Z79899 Other long term (current) drug therapy: Secondary | ICD-10-CM | POA: Insufficient documentation

## 2018-06-02 DIAGNOSIS — I4891 Unspecified atrial fibrillation: Secondary | ICD-10-CM | POA: Insufficient documentation

## 2018-06-02 DIAGNOSIS — D509 Iron deficiency anemia, unspecified: Secondary | ICD-10-CM | POA: Diagnosis not present

## 2018-06-02 DIAGNOSIS — E876 Hypokalemia: Secondary | ICD-10-CM | POA: Diagnosis not present

## 2018-06-02 DIAGNOSIS — D591 Autoimmune hemolytic anemia, unspecified: Secondary | ICD-10-CM

## 2018-06-02 DIAGNOSIS — E119 Type 2 diabetes mellitus without complications: Secondary | ICD-10-CM | POA: Insufficient documentation

## 2018-06-02 DIAGNOSIS — D539 Nutritional anemia, unspecified: Secondary | ICD-10-CM

## 2018-06-02 DIAGNOSIS — E785 Hyperlipidemia, unspecified: Secondary | ICD-10-CM | POA: Diagnosis not present

## 2018-06-02 DIAGNOSIS — Z9012 Acquired absence of left breast and nipple: Secondary | ICD-10-CM | POA: Diagnosis not present

## 2018-06-02 DIAGNOSIS — Z7901 Long term (current) use of anticoagulants: Secondary | ICD-10-CM | POA: Insufficient documentation

## 2018-06-02 DIAGNOSIS — I2510 Atherosclerotic heart disease of native coronary artery without angina pectoris: Secondary | ICD-10-CM | POA: Diagnosis not present

## 2018-06-02 DIAGNOSIS — I10 Essential (primary) hypertension: Secondary | ICD-10-CM | POA: Diagnosis not present

## 2018-06-02 DIAGNOSIS — Z7984 Long term (current) use of oral hypoglycemic drugs: Secondary | ICD-10-CM | POA: Diagnosis not present

## 2018-06-02 DIAGNOSIS — Z853 Personal history of malignant neoplasm of breast: Secondary | ICD-10-CM | POA: Diagnosis not present

## 2018-06-02 LAB — CBC WITH DIFFERENTIAL/PLATELET
Abs Immature Granulocytes: 0.02 10*3/uL (ref 0.00–0.07)
Basophils Absolute: 0 10*3/uL (ref 0.0–0.1)
Basophils Relative: 1 %
Eosinophils Absolute: 0.1 10*3/uL (ref 0.0–0.5)
Eosinophils Relative: 2 %
HCT: 27.5 % — ABNORMAL LOW (ref 36.0–46.0)
Hemoglobin: 9.2 g/dL — ABNORMAL LOW (ref 12.0–15.0)
Immature Granulocytes: 0 %
Lymphocytes Relative: 26 %
Lymphs Abs: 1.4 10*3/uL (ref 0.7–4.0)
MCH: 32.5 pg (ref 26.0–34.0)
MCHC: 33.5 g/dL (ref 30.0–36.0)
MCV: 97.2 fL (ref 80.0–100.0)
Monocytes Absolute: 0.5 10*3/uL (ref 0.1–1.0)
Monocytes Relative: 9 %
Neutro Abs: 3.3 10*3/uL (ref 1.7–7.7)
Neutrophils Relative %: 62 %
Platelets: 281 10*3/uL (ref 150–400)
RBC: 2.83 MIL/uL — ABNORMAL LOW (ref 3.87–5.11)
RDW: 15.7 % — ABNORMAL HIGH (ref 11.5–15.5)
WBC: 5.2 10*3/uL (ref 4.0–10.5)
nRBC: 0 % (ref 0.0–0.2)

## 2018-06-02 LAB — RETICULOCYTES
Immature Retic Fract: 16.3 % — ABNORMAL HIGH (ref 2.3–15.9)
RBC.: 2.83 MIL/uL — ABNORMAL LOW (ref 3.87–5.11)
Retic Count, Absolute: 157.1 10*3/uL (ref 19.0–186.0)
Retic Ct Pct: 5.6 % — ABNORMAL HIGH (ref 0.4–3.1)

## 2018-06-02 LAB — FERRITIN: Ferritin: 233 ng/mL (ref 11–307)

## 2018-06-02 LAB — COMPREHENSIVE METABOLIC PANEL
ALT: 23 U/L (ref 0–44)
AST: 24 U/L (ref 15–41)
Albumin: 4.2 g/dL (ref 3.5–5.0)
Alkaline Phosphatase: 51 U/L (ref 38–126)
Anion gap: 8 (ref 5–15)
BUN: 23 mg/dL (ref 8–23)
CO2: 32 mmol/L (ref 22–32)
Calcium: 9.6 mg/dL (ref 8.9–10.3)
Chloride: 103 mmol/L (ref 98–111)
Creatinine, Ser: 0.92 mg/dL (ref 0.44–1.00)
GFR calc Af Amer: >60 mL/min (ref >60)
GFR calc non Af Amer: 54 mL/min — ABNORMAL LOW (ref >60)
Glucose, Bld: 111 mg/dL — ABNORMAL HIGH (ref 70–99)
Potassium: 2.8 mmol/L — ABNORMAL LOW (ref 3.5–5.1)
Sodium: 143 mmol/L (ref 135–145)
Total Bilirubin: 1.3 mg/dL — ABNORMAL HIGH (ref 0.3–1.2)
Total Protein: 6.7 g/dL (ref 6.5–8.1)

## 2018-06-02 LAB — LACTATE DEHYDROGENASE: LDH: 269 U/L — ABNORMAL HIGH (ref 98–192)

## 2018-06-02 LAB — IRON AND TIBC
Iron: 52 ug/dL (ref 28–170)
Saturation Ratios: 17 % (ref 10.4–31.8)
TIBC: 300 ug/dL (ref 250–450)
UIBC: 248 ug/dL

## 2018-06-02 LAB — VITAMIN B12: Vitamin B-12: 264 pg/mL (ref 180–914)

## 2018-06-02 LAB — FOLATE: Folate: 16.8 ng/mL (ref >5.9)

## 2018-06-03 ENCOUNTER — Telehealth: Payer: Self-pay | Admitting: *Deleted

## 2018-06-03 DIAGNOSIS — E876 Hypokalemia: Secondary | ICD-10-CM

## 2018-06-03 LAB — DAT, POLYSPECIFIC AHG (ARMC ONLY)
DAT, IgG: POSITIVE
DAT, complement: POSITIVE
Polyspecific AHG test: POSITIVE

## 2018-06-03 LAB — HAPTOGLOBIN: Haptoglobin: <10 mg/dL — ABNORMAL LOW (ref 41–333)

## 2018-06-03 LAB — ANA COMPREHENSIVE PANEL

## 2018-06-03 LAB — KAPPA/LAMBDA LIGHT CHAINS
Kappa free light chain: 27.7 mg/L — ABNORMAL HIGH (ref 3.3–19.4)
Kappa, lambda light chain ratio: 2.18 — ABNORMAL HIGH (ref 0.26–1.65)
Lambda free light chains: 12.7 mg/L (ref 5.7–26.3)

## 2018-06-03 MED ORDER — POTASSIUM CHLORIDE ER 10 MEQ PO TBCR: 20.0000 meq | EXTENDED_RELEASE_TABLET | Freq: Every day | ORAL | 0 refills | Status: DC

## 2018-06-03 NOTE — Telephone Encounter (Signed)
Called patient to let her know that Dr. Janese Banks would like her to take potassium for 2 weeks.  States she is already taking 4 (10 mEq tablets) of potassium every day.  We did make a call to [employer] office who is her primary care but he is off work today.  His staff will still try to send a message to him electronically to see if he will return her phone call.  Meantime we are going to give an additional 20 mEq of potassium so she will take a total of 60 mEq daily.  [facility].  [facility]ed [facility] and they will do the additional 20 mEq for a total of 60 every day for 5 days and they will deliver it to her house.  Called the patient back to let her know the same thing.  So she is coming over here on Tuesday at 1245 to get a recheck of her potassium.  Shows that day because she already has an eye appointment at [facility] eye at [address] she would only have to find one ride for both appointments.

## 2018-06-03 NOTE — Telephone Encounter (Signed)
-----   Message from Sindy Guadeloupe, MD sent at 06/03/2018  8:16 AM EST ----- She needs 20 meq potassium daily for 2 weeks. Po b12 1000 mcg daily. Thanks, Astrid Divine

## 2018-06-04 ENCOUNTER — Other Ambulatory Visit: Payer: Self-pay | Admitting: Adult Health

## 2018-06-05 LAB — PATHOLOGIST SMEAR REVIEW

## 2018-06-06 LAB — MULTIPLE MYELOMA PANEL, SERUM
Albumin SerPl Elph-Mcnc: 3.8 g/dL (ref 2.9–4.4)
Albumin/Glob SerPl: 1.6 (ref 0.7–1.7)
Alpha 1: 0.3 g/dL (ref 0.0–0.4)
Alpha2 Glob SerPl Elph-Mcnc: 0.5 g/dL (ref 0.4–1.0)
B-Globulin SerPl Elph-Mcnc: 0.8 g/dL (ref 0.7–1.3)
Gamma Glob SerPl Elph-Mcnc: 0.8 g/dL (ref 0.4–1.8)
Globulin, Total: 2.4 g/dL (ref 2.2–3.9)
IgA: 173 mg/dL (ref 64–422)
IgG (Immunoglobin G), Serum: 835 mg/dL (ref 700–1600)
IgM (Immunoglobulin M), Srm: 114 mg/dL (ref 26–217)
Total Protein ELP: 6.2 g/dL (ref 6.0–8.5)

## 2018-06-07 ENCOUNTER — Telehealth: Payer: Self-pay | Admitting: *Deleted

## 2018-06-07 ENCOUNTER — Other Ambulatory Visit: Payer: Self-pay | Admitting: *Deleted

## 2018-06-07 ENCOUNTER — Inpatient Hospital Stay: Payer: Medicare Other | Attending: Oncology

## 2018-06-07 ENCOUNTER — Encounter: Payer: Self-pay | Admitting: Oncology

## 2018-06-07 DIAGNOSIS — M7989 Other specified soft tissue disorders: Secondary | ICD-10-CM | POA: Insufficient documentation

## 2018-06-07 DIAGNOSIS — E785 Hyperlipidemia, unspecified: Secondary | ICD-10-CM | POA: Insufficient documentation

## 2018-06-07 DIAGNOSIS — Z7984 Long term (current) use of oral hypoglycemic drugs: Secondary | ICD-10-CM | POA: Insufficient documentation

## 2018-06-07 DIAGNOSIS — J90 Pleural effusion, not elsewhere classified: Secondary | ICD-10-CM | POA: Diagnosis not present

## 2018-06-07 DIAGNOSIS — E119 Type 2 diabetes mellitus without complications: Secondary | ICD-10-CM | POA: Insufficient documentation

## 2018-06-07 DIAGNOSIS — I313 Pericardial effusion (noninflammatory): Secondary | ICD-10-CM | POA: Insufficient documentation

## 2018-06-07 DIAGNOSIS — K219 Gastro-esophageal reflux disease without esophagitis: Secondary | ICD-10-CM | POA: Insufficient documentation

## 2018-06-07 DIAGNOSIS — D591 Autoimmune hemolytic anemia, unspecified: Secondary | ICD-10-CM

## 2018-06-07 DIAGNOSIS — Z7901 Long term (current) use of anticoagulants: Secondary | ICD-10-CM | POA: Diagnosis not present

## 2018-06-07 DIAGNOSIS — I509 Heart failure, unspecified: Secondary | ICD-10-CM | POA: Diagnosis not present

## 2018-06-07 DIAGNOSIS — I2510 Atherosclerotic heart disease of native coronary artery without angina pectoris: Secondary | ICD-10-CM | POA: Diagnosis not present

## 2018-06-07 DIAGNOSIS — E876 Hypokalemia: Secondary | ICD-10-CM

## 2018-06-07 DIAGNOSIS — Z853 Personal history of malignant neoplasm of breast: Secondary | ICD-10-CM | POA: Diagnosis not present

## 2018-06-07 DIAGNOSIS — Z79899 Other long term (current) drug therapy: Secondary | ICD-10-CM | POA: Insufficient documentation

## 2018-06-07 DIAGNOSIS — I4891 Unspecified atrial fibrillation: Secondary | ICD-10-CM | POA: Insufficient documentation

## 2018-06-07 DIAGNOSIS — M1990 Unspecified osteoarthritis, unspecified site: Secondary | ICD-10-CM | POA: Diagnosis not present

## 2018-06-07 DIAGNOSIS — R5383 Other fatigue: Secondary | ICD-10-CM | POA: Diagnosis not present

## 2018-06-07 DIAGNOSIS — I341 Nonrheumatic mitral (valve) prolapse: Secondary | ICD-10-CM | POA: Diagnosis not present

## 2018-06-07 DIAGNOSIS — Z90722 Acquired absence of ovaries, bilateral: Secondary | ICD-10-CM | POA: Diagnosis not present

## 2018-06-07 DIAGNOSIS — I10 Essential (primary) hypertension: Secondary | ICD-10-CM | POA: Insufficient documentation

## 2018-06-07 DIAGNOSIS — I700 Atherosclerosis of aorta: Secondary | ICD-10-CM | POA: Insufficient documentation

## 2018-06-07 DIAGNOSIS — Z9012 Acquired absence of left breast and nipple: Secondary | ICD-10-CM | POA: Insufficient documentation

## 2018-06-07 LAB — BASIC METABOLIC PANEL
Anion gap: 10 (ref 5–15)
BUN: 29 mg/dL — ABNORMAL HIGH (ref 8–23)
CO2: 30 mmol/L (ref 22–32)
Calcium: 9.4 mg/dL (ref 8.9–10.3)
Chloride: 99 mmol/L (ref 98–111)
Creatinine, Ser: 1.2 mg/dL — ABNORMAL HIGH (ref 0.44–1.00)
GFR calc Af Amer: 45 mL/min — ABNORMAL LOW (ref >60)
GFR calc non Af Amer: 39 mL/min — ABNORMAL LOW (ref >60)
Glucose, Bld: 159 mg/dL — ABNORMAL HIGH (ref 70–99)
Potassium: 2.9 mmol/L — ABNORMAL LOW (ref 3.5–5.1)
Sodium: 139 mmol/L (ref 135–145)

## 2018-06-07 LAB — MAGNESIUM: Magnesium: 2.1 mg/dL (ref 1.7–2.4)

## 2018-06-07 NOTE — Telephone Encounter (Signed)
Called and spoke to staff member at [facility] pharmacy about patient's cellcept.  She states that it was last filled on February 29 it is a month supply of meds and it will need to be refilled by someone at the end of the month.

## 2018-06-07 NOTE — Telephone Encounter (Signed)
Ok can you please let the patient know she will continue cell cept at this time. Initially my plan was to start steroids which I will hold off for now. We wneed to check cbc with diff/cmp/hapto/ retic count when she sees me next. Thanks

## 2018-06-07 NOTE — Telephone Encounter (Signed)
Called Dr. Myrtis Ser office and left a message for the staff to call either me or Dr. Janese Banks about patient's potassium.  Not heard anything from office so we called patient to see if she could come in tomorrow and get IV potassium.  Dr. Janese Banks would like to give her 30 mEq of potassium which would take 3 hours.  It has been made for 10:00 tomorrow morning.  Explained all of this to the patient and she says she will find a ride.  She will call me back if for some reason she will not be able to make it tomorrow she is been taking her potassium to 6 pills once a day ever since we gave her the additional dose.  Before that she was taking 40 mEq a day and we added an additional 20 mEq/day and she has not missed any doses.  Once the potassium only came up the 10th of a point from 2.8-2.9 this is well recommended IV potassium.
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
## 2018-06-08 ENCOUNTER — Inpatient Hospital Stay: Payer: Medicare Other

## 2018-06-08 ENCOUNTER — Telehealth: Payer: Self-pay | Admitting: *Deleted

## 2018-06-08 DIAGNOSIS — E876 Hypokalemia: Secondary | ICD-10-CM

## 2018-06-08 DIAGNOSIS — D591 Other autoimmune hemolytic anemias: Secondary | ICD-10-CM | POA: Diagnosis not present

## 2018-06-08 MED ORDER — SODIUM CHLORIDE 0.9 % IV SOLN
Freq: Once | INTRAVENOUS | Status: AC
  Administered 2018-06-08: 11:00:00 via INTRAVENOUS
  Filled 2018-06-08: qty 1000

## 2018-06-08 MED ORDER — SODIUM CHLORIDE 0.9 % IV SOLN
INTRAVENOUS | Status: DC
  Administered 2018-06-08: 11:00:00 via INTRAVENOUS
  Filled 2018-06-08: qty 250

## 2018-06-08 NOTE — Telephone Encounter (Signed)
Went in the back in the infusion area to talk to the patient so that we can determine about when she could come back and have her potassium rechecked.  The patient tells me that she is going to [employer] office on Monday to get it checked.  She did not know the exact time so I called over to Dr. Myrtis Ser office and she has an appointment on Monday at 215 to see the doctor for hypokalemia.  Lab attached so asked the secretary to send the message back to see if she can have her potassium checked on that day or else we will have to come have her come over and get her labs rechecked.  She will send the message but she feels like because she is coming in for a low potassium level that they will check it anyway.

## 2018-06-10 ENCOUNTER — Other Ambulatory Visit: Payer: Self-pay | Admitting: *Deleted

## 2018-06-10 DIAGNOSIS — D591 Autoimmune hemolytic anemia, unspecified: Secondary | ICD-10-CM

## 2018-06-10 NOTE — Telephone Encounter (Signed)
I put them on for 3/13 next visit

## 2018-06-13 DIAGNOSIS — Z8673 Personal history of transient ischemic attack (TIA), and cerebral infarction without residual deficits: Secondary | ICD-10-CM | POA: Insufficient documentation

## 2018-06-13 DIAGNOSIS — G8191 Hemiplegia, unspecified affecting right dominant side: Secondary | ICD-10-CM | POA: Insufficient documentation

## 2018-06-15 ENCOUNTER — Other Ambulatory Visit: Payer: Self-pay

## 2018-06-15 ENCOUNTER — Ambulatory Visit
Admission: RE | Admit: 2018-06-15 | Discharge: 2018-06-15 | Disposition: A | Discharge status: Home / Self Care | Payer: Medicare Other | Source: Ambulatory Visit | Attending: Oncology | Admitting: Oncology

## 2018-06-15 DIAGNOSIS — D591 Autoimmune hemolytic anemia, unspecified: Secondary | ICD-10-CM

## 2018-06-15 DIAGNOSIS — Z86711 Personal history of pulmonary embolism: Secondary | ICD-10-CM

## 2018-06-15 MED ORDER — IOHEXOL 300 MG/ML  SOLN
60.0000 mL | Freq: Once | INTRAMUSCULAR | Status: AC | PRN
  Administered 2018-06-15: 60 mL via INTRAVENOUS

## 2018-06-17 ENCOUNTER — Inpatient Hospital Stay (HOSPITAL_BASED_OUTPATIENT_CLINIC_OR_DEPARTMENT_OTHER): Payer: Medicare Other | Admitting: Oncology

## 2018-06-17 ENCOUNTER — Inpatient Hospital Stay: Payer: Medicare Other

## 2018-06-17 ENCOUNTER — Other Ambulatory Visit: Payer: Self-pay

## 2018-06-17 ENCOUNTER — Encounter: Payer: Self-pay | Admitting: Oncology

## 2018-06-17 VITALS — BP 134/82 | HR 59 | Temp 97.6°F | Resp 12 | Ht 64.0 in | Wt 105.3 lb

## 2018-06-17 DIAGNOSIS — M7989 Other specified soft tissue disorders: Secondary | ICD-10-CM

## 2018-06-17 DIAGNOSIS — K219 Gastro-esophageal reflux disease without esophagitis: Secondary | ICD-10-CM

## 2018-06-17 DIAGNOSIS — E119 Type 2 diabetes mellitus without complications: Secondary | ICD-10-CM

## 2018-06-17 DIAGNOSIS — D591 Autoimmune hemolytic anemia, unspecified: Secondary | ICD-10-CM

## 2018-06-17 DIAGNOSIS — I2510 Atherosclerotic heart disease of native coronary artery without angina pectoris: Secondary | ICD-10-CM

## 2018-06-17 DIAGNOSIS — Z7901 Long term (current) use of anticoagulants: Secondary | ICD-10-CM

## 2018-06-17 DIAGNOSIS — J90 Pleural effusion, not elsewhere classified: Secondary | ICD-10-CM

## 2018-06-17 DIAGNOSIS — M1990 Unspecified osteoarthritis, unspecified site: Secondary | ICD-10-CM

## 2018-06-17 DIAGNOSIS — E785 Hyperlipidemia, unspecified: Secondary | ICD-10-CM

## 2018-06-17 DIAGNOSIS — I10 Essential (primary) hypertension: Secondary | ICD-10-CM | POA: Diagnosis not present

## 2018-06-17 DIAGNOSIS — Z79899 Other long term (current) drug therapy: Secondary | ICD-10-CM

## 2018-06-17 DIAGNOSIS — Z9012 Acquired absence of left breast and nipple: Secondary | ICD-10-CM

## 2018-06-17 DIAGNOSIS — I341 Nonrheumatic mitral (valve) prolapse: Secondary | ICD-10-CM

## 2018-06-17 DIAGNOSIS — I4891 Unspecified atrial fibrillation: Secondary | ICD-10-CM

## 2018-06-17 DIAGNOSIS — R5383 Other fatigue: Secondary | ICD-10-CM

## 2018-06-17 DIAGNOSIS — Z7984 Long term (current) use of oral hypoglycemic drugs: Secondary | ICD-10-CM

## 2018-06-17 DIAGNOSIS — I509 Heart failure, unspecified: Secondary | ICD-10-CM

## 2018-06-17 DIAGNOSIS — Z853 Personal history of malignant neoplasm of breast: Secondary | ICD-10-CM

## 2018-06-17 DIAGNOSIS — Z90722 Acquired absence of ovaries, bilateral: Secondary | ICD-10-CM

## 2018-06-17 DIAGNOSIS — I313 Pericardial effusion (noninflammatory): Secondary | ICD-10-CM

## 2018-06-17 DIAGNOSIS — I700 Atherosclerosis of aorta: Secondary | ICD-10-CM

## 2018-06-17 LAB — COMPREHENSIVE METABOLIC PANEL
ALT: 21 U/L (ref 0–44)
AST: 28 U/L (ref 15–41)
Albumin: 4.1 g/dL (ref 3.5–5.0)
Alkaline Phosphatase: 54 U/L (ref 38–126)
Anion gap: 8 (ref 5–15)
BUN: 16 mg/dL (ref 8–23)
CO2: 25 mmol/L (ref 22–32)
Calcium: 9.3 mg/dL (ref 8.9–10.3)
Chloride: 108 mmol/L (ref 98–111)
Creatinine, Ser: 0.88 mg/dL (ref 0.44–1.00)
GFR calc Af Amer: >60 mL/min (ref >60)
GFR calc non Af Amer: 57 mL/min — ABNORMAL LOW (ref >60)
Glucose, Bld: 201 mg/dL — ABNORMAL HIGH (ref 70–99)
Potassium: 4.5 mmol/L (ref 3.5–5.1)
Sodium: 141 mmol/L (ref 135–145)
Total Bilirubin: 1.5 mg/dL — ABNORMAL HIGH (ref 0.3–1.2)
Total Protein: 6.4 g/dL — ABNORMAL LOW (ref 6.5–8.1)

## 2018-06-17 LAB — CBC WITH DIFFERENTIAL/PLATELET
Abs Immature Granulocytes: 0.03 10*3/uL (ref 0.00–0.07)
Basophils Absolute: 0 10*3/uL (ref 0.0–0.1)
Basophils Relative: 1 %
Eosinophils Absolute: 0.1 10*3/uL (ref 0.0–0.5)
Eosinophils Relative: 2 %
HCT: 27.1 % — ABNORMAL LOW (ref 36.0–46.0)
Hemoglobin: 8.6 g/dL — ABNORMAL LOW (ref 12.0–15.0)
Immature Granulocytes: 1 %
Lymphocytes Relative: 16 %
Lymphs Abs: 0.9 10*3/uL (ref 0.7–4.0)
MCH: 30.8 pg (ref 26.0–34.0)
MCHC: 31.7 g/dL (ref 30.0–36.0)
MCV: 97.1 fL (ref 80.0–100.0)
Monocytes Absolute: 0.6 10*3/uL (ref 0.1–1.0)
Monocytes Relative: 10 %
Neutro Abs: 4 10*3/uL (ref 1.7–7.7)
Neutrophils Relative %: 70 %
Platelets: 265 10*3/uL (ref 150–400)
RBC: 2.79 MIL/uL — ABNORMAL LOW (ref 3.87–5.11)
RDW: 17.2 % — ABNORMAL HIGH (ref 11.5–15.5)
WBC: 5.7 10*3/uL (ref 4.0–10.5)
nRBC: 0 % (ref 0.0–0.2)

## 2018-06-17 LAB — RETICULOCYTES
Immature Retic Fract: 13.3 % (ref 2.3–15.9)
RBC.: 2.79 MIL/uL — ABNORMAL LOW (ref 3.87–5.11)
Retic Count, Absolute: 150.1 10*3/uL (ref 19.0–186.0)
Retic Ct Pct: 5.4 % — ABNORMAL HIGH (ref 0.4–3.1)

## 2018-06-17 NOTE — Progress Notes (Signed)
Patient here for follow up. She has no complaints today.

## 2018-06-18 LAB — HAPTOGLOBIN: Haptoglobin: <10 mg/dL — ABNORMAL LOW (ref 41–333)

## 2018-06-20 ENCOUNTER — Telehealth: Payer: Self-pay | Admitting: *Deleted

## 2018-06-20 ENCOUNTER — Other Ambulatory Visit: Payer: Self-pay | Admitting: Oncology

## 2018-06-20 MED ORDER — FOLIC ACID 1 MG PO TABS: 1.0000 mg | ORAL_TABLET | Freq: Every day | ORAL | 3 refills | Status: DC

## 2018-06-20 NOTE — Telephone Encounter (Signed)
I will send it now

## 2018-06-20 NOTE — Progress Notes (Signed)
Folic acid prescription

## 2018-06-20 NOTE — Progress Notes (Signed)
Hematology/Oncology Consult note [facility]  [phone] [phone]  Patient Care Team: Danella Penton, MD as PCP - General (Internal Medicine) Danella Penton, MD (Internal Medicine) Lemar Livings Merrily Pew, MD (General Surgery) Dalia Heading, MD as Consulting Physician (Cardiology)   Name of the patient: Elizabeth Novak  [id number]  1924-03-13   Date of visit: 06/20/18  Diagnosis-warm autoimmune hemolytic anemia  Chief complaint/ Reason for visit-routine follow-up of warm autoimmune hemolytic anemia  Heme/Onc history: Patient is a 85 year old female with a past medical history significant for hypertension, type 2 diabetes, CAD, hyperlipidemia, history of A. fib on Coumadin, sick sinus syndrome status post pacemaker placement and history of breast cancer status post left mastectomy in the past.  She was admitted to [facility] on 04/18/2018 with symptoms of right-sided weakness and expressive a aphasia.  She was treated for an acute left MCA ischemic stroke.  She was started on apixaban 2.5 mg twice daily and aspirin/Coumadin was discontinued.  During that admission she was also found to have a hemoglobin of 7.  Prior to that she had her last normal hemoglobin sometime in June 2019 when it was 13 and then gradually drifted down to 9 there was also some concern for GI bleed and she had seen [facility] in the past.  Her hemoglobin did improve back up to 13 in August 2019 and remained stable between 12-13 until November 2019   During her [facility] hospitalization patient was diagnosed with warm autoimmune hemolytic anemia.  Hematology was consulted and given her recent stroke, underlying diabetes-steroids was not offered as for first-line treatment and she was started on CellCept instead 500 mg twice daily she was supposed to follow-up with [facility] hematology as an outpatient but preferred to get care locally at [facility]   Interval history-she  feels at her baseline.  She does have chronic bilateral lower extremity swelling which is unchanged.  Also reports mild fatigue which is unchanged since the last visit.  Denies any blood in stools.  ECOG PS- 2 Pain scale- 0   Review of systems- Review of Systems  Constitutional: Positive for malaise/fatigue. Negative for chills, fever and weight loss.  HENT: Negative for congestion, ear discharge and nosebleeds.   Eyes: Negative for blurred vision.  Respiratory: Negative for cough, hemoptysis, sputum production, shortness of breath and wheezing.   Cardiovascular: Positive for leg swelling. Negative for chest pain, palpitations, orthopnea and claudication.  Gastrointestinal: Negative for abdominal pain, blood in stool, constipation, diarrhea, heartburn, melena, nausea and vomiting.  Genitourinary: Negative for dysuria, flank pain, frequency, hematuria and urgency.  Musculoskeletal: Negative for back pain, joint pain and myalgias.  Skin: Negative for rash.  Neurological: Negative for dizziness, tingling, focal weakness, seizures, weakness and headaches.  Endo/Heme/Allergies: Does not bruise/bleed easily.  Psychiatric/Behavioral: Negative for depression and suicidal ideas. The patient does not have insomnia.       Allergies  Allergen Reactions   Codeine Nausea Only   Disopyramide     Other reaction(s): Unknown   Ibuprofen Diarrhea   Iodine     blisters   Metformin And Related    Norpace [Disopyramide Phosphate]    Quinidine     Other reaction(s): Unknown   Terfenadine     Other reaction(s): Unknown   Topiramate     Other reaction(s): Other (See Comments) Hair loss   Verapamil     Other reaction(s): Unknown     Past Medical History:  Diagnosis Date  A-fib (HCC)    Anemia    Arthritis    Breast cancer (HCC) 1978   left breast with lymph node removal   CHF (congestive heart failure) (HCC)    Diabetes mellitus without complication (HCC)     Diverticulitis    Dysrhythmia    GERD (gastroesophageal reflux disease)    Hyperlipidemia    Macular degeneration of both eyes    Mitral valve prolapse    Mitral valve regurgitation    Presence of permanent cardiac pacemaker      Past Surgical History:  Procedure Laterality Date   ABDOMINAL HYSTERECTOMY     APPENDECTOMY     BREAST SURGERY     CARDIAC CATHETERIZATION     ESOPHAGEAL DILATION     EYE SURGERY Bilateral    Cataract Extraction with IOL   INSERT / REPLACE / REMOVE PACEMAKER     IRRIGATION AND DEBRIDEMENT HEMATOMA Left 08/21/2015   Procedure: IRRIGATION AND DEBRIDEMENT HEMATOMA;  Surgeon: Earline Mayotte, MD;  Location: [facility] ORS;  Service: General;  Laterality: Left;   KNEE ARTHROSCOPY Right    LEFT OOPHORECTOMY Left    MASTECTOMY Left 1978   MASTECTOMY Right 1978   OPEN REDUCTION INTERNAL FIXATION (ORIF) DISTAL RADIAL FRACTURE Right 02/14/2018   Procedure: OPEN REDUCTION INTERNAL FIXATION (ORIF) DISTAL RADIAL FRACTURE;  Surgeon: Kennedy Bucker, MD;  Location: [facility] ORS;  Service: Orthopedics;  Laterality: Right;   PACEMAKER INSERTION  08/11/12   TEMPORAL ARTERY BIOPSY / LIGATION     TONSILLECTOMY      Social History   Socioeconomic History   Marital status: Widowed    Spouse name: Not on file   Number of children: 1   Years of education: 62   Highest education level: 11th grade  Occupational History   Not on file  Social Needs   Financial resource strain: Not hard at all   Food insecurity:    Worry: Never true    Inability: Never true   Transportation needs:    Medical: No    Non-medical: No  Tobacco Use   Smoking status: Never Smoker   Smokeless tobacco: Never Used  Substance and Sexual Activity   Alcohol use: No    Alcohol/week: 0.0 standard drinks   Drug use: No   Sexual activity: Not on file  Lifestyle   Physical activity:    Days per week: 7 days    Minutes per session: 10 min   Stress: Not at all    Relationships   Social connections:    Talks on phone: More than three times a week    Gets together: More than three times a week    Attends religious service: More than 4 times per year    Active member of club or organization: No    Attends meetings of clubs or organizations: Never    Relationship status: Widowed   Intimate partner violence:    Fear of current or ex partner: No    Emotionally abused: No    Physically abused: No    Forced sexual activity: No  Other Topics Concern   Not on file  Social History Narrative   Widowed   1 son   Never smoker or smokeless tobacco user   No alcohol use   Full Code    Family History  Problem Relation Age of Onset   Hypertension Mother      Current Outpatient Medications:    apixaban (ELIQUIS) 2.5 MG TABS tablet, Take 1 tablet (  2.5 mg total) by mouth 2 (two) times daily., Disp: 60 tablet, Rfl: 0   atorvastatin (LIPITOR) 20 MG tablet, Take 1 tablet (20 mg total) by mouth at bedtime., Disp: 30 tablet, Rfl: 0   digoxin (LANOXIN) 0.125 MG tablet, Take 1 tablet (0.125 mg total) by mouth every other day. CHF- * Hold for HR < 60, Disp: 15 tablet, Rfl: 0   glimepiride (AMARYL) 2 MG tablet, Take 1 tablet (2 mg total) by mouth 2 (two) times daily., Disp: 60 tablet, Rfl: 0   lidocaine (LIDODERM) 5 %, Place 1 patch onto the skin daily. Apply patch to painful area in the  and remove at bedtime, Disp: 30 patch, Rfl: 0   metolazone (ZAROXOLYN) 2.5 MG tablet, Take 1 tablet (2.5 mg total) by mouth once a week. 1 tablet on Sunday, Disp: 4 tablet, Rfl: 0   metoprolol succinate (TOPROL-XL) 50 MG 24 hr tablet, Take 1 tablet (50 mg total) by mouth daily., Disp: 30 tablet, Rfl: 0   mirtazapine (REMERON) 7.5 MG tablet, Take 1 tablet (7.5 mg total) by mouth at bedtime., Disp: 30 tablet, Rfl: 0   Multiple Vitamins-Minerals (PRESERVISION AREDS 2 PO), Take 1 tablet by mouth 2 (two) times daily., Disp: , Rfl:    mycophenolate (CELLCEPT) 500 MG  tablet, Take 1 tablet (500 mg total) by mouth 2 (two) times daily., Disp: 60 tablet, Rfl: 0   NON FORMULARY, Diet Type: Mechanical soft chopped, nectar thick liquids, NCS, Disp: , Rfl:    potassium chloride (K-DUR,KLOR-CON) 10 MEQ tablet, Take 4 tablets (40 mEq total) by mouth daily., Disp: 120 tablet, Rfl: 0   sitaGLIPtin (JANUVIA) 100 MG tablet, Take 1 tablet (100 mg total) by mouth daily., Disp: 30 tablet, Rfl: 0   torsemide (DEMADEX) 20 MG tablet, Take 1 tablet (20 mg total) by mouth daily., Disp: 30 tablet, Rfl: 0  Physical exam:  Vitals:   06/17/18 0904 06/17/18 0910  BP:  134/82  Pulse:  (!) 59  Resp: 12   Temp:  97.6 F (36.4 C)  TempSrc:  Tympanic  Weight: 105 lb 4.8 oz (47.8 kg)   Height: 5\' 4"  (1.626 m)    Physical Exam Constitutional:      Comments: Frail elderly woman sitting in a wheelchair.  Appears in no acute distress  HENT:     Head: Normocephalic and atraumatic.  Eyes:     Pupils: Pupils are equal, round, and reactive to light.  Neck:     Musculoskeletal: Normal range of motion.  Cardiovascular:     Rate and Rhythm: Normal rate and regular rhythm.     Heart sounds: Normal heart sounds.  Pulmonary:     Effort: Pulmonary effort is normal.     Breath sounds: Normal breath sounds.  Abdominal:     General: Bowel sounds are normal.     Palpations: Abdomen is soft.  Musculoskeletal:     Right lower leg: Edema present.     Left lower leg: Edema present.  Skin:    General: Skin is warm and dry.  Neurological:     Mental Status: She is alert and oriented to person, place, and time.      CMP Latest Ref Rng & Units 06/17/2018  Glucose 70 - 99 mg/dL 329(J)  BUN 8 - 23 mg/dL 16  Creatinine 1.88 - 4.16 mg/dL 6.06  Sodium 301 - 601 mmol/L 141  Potassium 3.5 - 5.1 mmol/L 4.5  Chloride 98 - 111 mmol/L 108  CO2 22 -  32 mmol/L 25  Calcium 8.9 - 10.3 mg/dL 9.3  Total Protein 6.5 - 8.1 g/dL 6.4(L)  Total Bilirubin 0.3 - 1.2 mg/dL 1.6(X)  Alkaline Phos 38 - 126  U/L 54  AST 15 - 41 U/L 28  ALT 0 - 44 U/L 21   CBC Latest Ref Rng & Units 06/17/2018  WBC 4.0 - 10.5 K/uL 5.7  Hemoglobin 12.0 - 15.0 g/dL 0.9(U)  Hematocrit 04.5 - 46.0 % 27.1(L)  Platelets 150 - 400 K/uL 265    No images are attached to the encounter.  Ct Chest W Contrast  Result Date: 06/15/2018 CLINICAL DATA:  Autoimmune hemolytic anemia, breast cancer. EXAM: CT CHEST WITH CONTRAST TECHNIQUE: Multidetector CT imaging of the chest was performed during intravenous contrast administration. CONTRAST:  60mL OMNIPAQUE IOHEXOL 300 MG/ML  SOLN COMPARISON:  06/30/2015. FINDINGS: Cardiovascular: Atherosclerotic calcification of the aorta and coronary arteries. Heart is moderately enlarged. Small amount of pericardial fluid. Mediastinum/Nodes: 11 mm low-attenuation lesion in the right thyroid, similar. No pathologically enlarged mediastinal, hilar or axillary lymph nodes. Esophagus is grossly unremarkable. Lungs/Pleura: Triangular-shaped subpleural scarring in the apical segment right upper lobe. A few scattered millimetric pulmonary nodules are unchanged. Tiny bilateral pleural effusions, right greater than left. Airway is unremarkable. Upper Abdomen: Visualized portions of the liver and right adrenal gland are unremarkable. Thickening of the body of the left adrenal gland. Mild cortical scarring in the left kidney. Visualized portions of the spleen, pancreas and stomach are grossly unremarkable. Musculoskeletal: Degenerative changes in the spine and shoulders. T3 compression deformity is unchanged. Mild compression of the T12 superior endplate is new but age indeterminate. Old sternal fracture. IMPRESSION: 1. Tiny bilateral pleural effusions, right greater than left. 2. Small pericardial effusion. 3. Aortic atherosclerosis (ICD10-170.0). Coronary artery calcification. Electronically Signed   By: Leanna Battles M.D.   On: 06/15/2018 14:27     Assessment and plan- Patient is a 85 y.o. female with warm  autoimmune hemolytic anemia currently on CellCept  Warm autoimmune hemolytic anemia: I discussed the results of the blood work with the patient in detail which continues to show that she has more amount of antibodies and ongoing hemolysis.  Her haptoglobin remains less than 10 and her bilirubin is 1.5.  Work-up for autoimmune hemolytic anemia including CT abdomen and done at [facility] as well as CT chest which was performed at [place] did not reveal any evidence of malignancy.  Multiple myeloma panel did not reveal any M protein.  Serum free light chain ratio was abnormal at 2.18.  She is anemic but her hemoglobin is stable around 8.6 and not requiring blood transfusion at this time.  She is not a great candidate for steroids due to issues mentioned above and is currently on CellCept which she is tolerating it well.    Given the ongoing COVID national emergency, patients age and frailty, lack of overt symptoms from anemia and the fact that she is tolerating cell cept well; I am inclined to continue cell cept without switching her to rituxan at this time. Rituxan is associated with risk of immunosuppression. Frequent doctors visits and meed for infusions. Patient verbalized understanding  I will see her back in 1 month's time with CBC with differential, CMP, haptoglobin, reticulocyte count and serum free light chains   Total face to face encounter time for this patient visit was 30 min. >50% of the time was  spent in counseling and coordination of care.     Visit Diagnosis 1. Autoimmune hemolytic anemia (  HCC)      Dr. Owens Shark, MD, MPH [facility] [phone] 06/20/2018 9:55 AM

## 2018-06-20 NOTE — Telephone Encounter (Signed)
Pharmacy called stating family was expecting prescription for Folic Acid to be sent in last week and it was not sent. Please advise

## 2018-06-25 ENCOUNTER — Other Ambulatory Visit: Payer: Self-pay | Admitting: Adult Health

## 2018-07-14 ENCOUNTER — Inpatient Hospital Stay: Payer: Medicare Other | Attending: Oncology

## 2018-07-14 ENCOUNTER — Other Ambulatory Visit: Payer: Self-pay

## 2018-07-14 ENCOUNTER — Inpatient Hospital Stay (HOSPITAL_BASED_OUTPATIENT_CLINIC_OR_DEPARTMENT_OTHER): Payer: Medicare Other | Admitting: Oncology

## 2018-07-14 ENCOUNTER — Encounter: Payer: Self-pay | Admitting: Oncology

## 2018-07-14 VITALS — BP 130/71 | HR 79 | Temp 96.6°F | Resp 16 | Ht 64.0 in | Wt 102.6 lb

## 2018-07-14 DIAGNOSIS — K219 Gastro-esophageal reflux disease without esophagitis: Secondary | ICD-10-CM | POA: Diagnosis not present

## 2018-07-14 DIAGNOSIS — D591 Autoimmune hemolytic anemia, unspecified: Secondary | ICD-10-CM

## 2018-07-14 DIAGNOSIS — Z79899 Other long term (current) drug therapy: Secondary | ICD-10-CM

## 2018-07-14 DIAGNOSIS — Z8673 Personal history of transient ischemic attack (TIA), and cerebral infarction without residual deficits: Secondary | ICD-10-CM | POA: Insufficient documentation

## 2018-07-14 DIAGNOSIS — M129 Arthropathy, unspecified: Secondary | ICD-10-CM | POA: Diagnosis not present

## 2018-07-14 DIAGNOSIS — I495 Sick sinus syndrome: Secondary | ICD-10-CM

## 2018-07-14 DIAGNOSIS — I509 Heart failure, unspecified: Secondary | ICD-10-CM

## 2018-07-14 DIAGNOSIS — I313 Pericardial effusion (noninflammatory): Secondary | ICD-10-CM | POA: Diagnosis not present

## 2018-07-14 DIAGNOSIS — I341 Nonrheumatic mitral (valve) prolapse: Secondary | ICD-10-CM

## 2018-07-14 DIAGNOSIS — E785 Hyperlipidemia, unspecified: Secondary | ICD-10-CM

## 2018-07-14 DIAGNOSIS — J90 Pleural effusion, not elsewhere classified: Secondary | ICD-10-CM

## 2018-07-14 DIAGNOSIS — I4891 Unspecified atrial fibrillation: Secondary | ICD-10-CM

## 2018-07-14 DIAGNOSIS — I10 Essential (primary) hypertension: Secondary | ICD-10-CM | POA: Insufficient documentation

## 2018-07-14 DIAGNOSIS — E119 Type 2 diabetes mellitus without complications: Secondary | ICD-10-CM | POA: Diagnosis not present

## 2018-07-14 DIAGNOSIS — I2510 Atherosclerotic heart disease of native coronary artery without angina pectoris: Secondary | ICD-10-CM | POA: Diagnosis not present

## 2018-07-14 DIAGNOSIS — Z853 Personal history of malignant neoplasm of breast: Secondary | ICD-10-CM | POA: Insufficient documentation

## 2018-07-14 DIAGNOSIS — M545 Low back pain: Secondary | ICD-10-CM

## 2018-07-14 LAB — CBC WITH DIFFERENTIAL/PLATELET
Abs Immature Granulocytes: 0.02 10*3/uL (ref 0.00–0.07)
Basophils Absolute: 0 10*3/uL (ref 0.0–0.1)
Basophils Relative: 1 %
Eosinophils Absolute: 0.1 10*3/uL (ref 0.0–0.5)
Eosinophils Relative: 1 %
HCT: 29.6 % — ABNORMAL LOW (ref 36.0–46.0)
Hemoglobin: 9.7 g/dL — ABNORMAL LOW (ref 12.0–15.0)
Immature Granulocytes: 0 %
Lymphocytes Relative: 16 %
Lymphs Abs: 0.9 10*3/uL (ref 0.7–4.0)
MCH: 31.2 pg (ref 26.0–34.0)
MCHC: 32.8 g/dL (ref 30.0–36.0)
MCV: 95.2 fL (ref 80.0–100.0)
Monocytes Absolute: 0.5 10*3/uL (ref 0.1–1.0)
Monocytes Relative: 8 %
Neutro Abs: 4.2 10*3/uL (ref 1.7–7.7)
Neutrophils Relative %: 74 %
Platelets: 296 10*3/uL (ref 150–400)
RBC: 3.11 MIL/uL — ABNORMAL LOW (ref 3.87–5.11)
RDW: 18.6 % — ABNORMAL HIGH (ref 11.5–15.5)
WBC: 5.6 10*3/uL (ref 4.0–10.5)
nRBC: 0 % (ref 0.0–0.2)

## 2018-07-14 LAB — COMPREHENSIVE METABOLIC PANEL
ALT: 20 U/L (ref 0–44)
AST: 26 U/L (ref 15–41)
Albumin: 4.3 g/dL (ref 3.5–5.0)
Alkaline Phosphatase: 54 U/L (ref 38–126)
Anion gap: 8 (ref 5–15)
BUN: 25 mg/dL — ABNORMAL HIGH (ref 8–23)
CO2: 25 mmol/L (ref 22–32)
Calcium: 9.6 mg/dL (ref 8.9–10.3)
Chloride: 104 mmol/L (ref 98–111)
Creatinine, Ser: 1.11 mg/dL — ABNORMAL HIGH (ref 0.44–1.00)
GFR calc Af Amer: 50 mL/min — ABNORMAL LOW (ref >60)
GFR calc non Af Amer: 43 mL/min — ABNORMAL LOW (ref >60)
Glucose, Bld: 233 mg/dL — ABNORMAL HIGH (ref 70–99)
Potassium: 4.3 mmol/L (ref 3.5–5.1)
Sodium: 137 mmol/L (ref 135–145)
Total Bilirubin: 2 mg/dL — ABNORMAL HIGH (ref 0.3–1.2)
Total Protein: 6.7 g/dL (ref 6.5–8.1)

## 2018-07-14 LAB — RETICULOCYTES
Immature Retic Fract: 11.4 % (ref 2.3–15.9)
RBC.: 3.11 MIL/uL — ABNORMAL LOW (ref 3.87–5.11)
Retic Count, Absolute: 178.8 10*3/uL (ref 19.0–186.0)
Retic Ct Pct: 5.8 % — ABNORMAL HIGH (ref 0.4–3.1)

## 2018-07-14 NOTE — Progress Notes (Signed)
She has mid back pain on right side that comes and goes. She is currently on atb for UTI

## 2018-07-14 NOTE — Progress Notes (Signed)
Hematology/Oncology Consult note [facility]  Telephone:([phone] [phone]  Patient Care Team: Danella Penton, MD as PCP - General (Internal Medicine) Danella Penton, MD (Internal Medicine) Lemar Livings Merrily Pew, MD (General Surgery) Dalia Heading, MD as Consulting Physician (Cardiology)   Name of the patient: Elizabeth Novak  [id number]  [birth year]   Date of visit: 07/14/18  Diagnosis- warm autoimmune hemolytic anemia  Chief complaint/ Reason for visit-routine follow-up of warm autoimmune hemolytic anemia  Heme/Onc history: Patient is a 85 year old female with a past medical history significant for hypertension, type 2 diabetes, CAD, hyperlipidemia, history of A. fib on Coumadin, sick sinus syndrome status post pacemaker placement and history of breast cancer status post left mastectomy in the past. She was admitted to [facility] on 04/18/2018 with symptoms of right-sided weakness and expressive a aphasia. She was treated for an acute left MCA ischemic stroke. She was started on apixaban 2.5 mg twice daily and aspirin/Coumadinwas discontinued. During that admission she was also found to have a hemoglobin of 7. Prior to that she had her last normal hemoglobin sometime in June 2019 when it was 13 and then gradually drifted down to 9 there wasalso some concern for GI bleed and she had seen [facility] in the past. Her hemoglobin did improve back up to 13 in August 2019 and remained stable between 12-13 until November 2019  During her [facility] hospitalization patient was diagnosed with warm autoimmune hemolytic anemia. Hematology was consulted and given her recent stroke, underlying diabetes-steroids was not offered as for first-line treatment and she was started on CellCept instead 500 mg twice daily she was supposed to follow-up with [facility] hematology as an outpatient but preferred to get care locally at [facility].  CT chest abdomen and pelvis  did not reveal any evidence of malignancy.Multiple myeloma panel did not reveal any M protein.  Serum free light chain ratio was abnormal at 2.18.  She is anemic but her hemoglobin is stable around 8.6- 9.1 and not requiring blood transfusion at this time.    Interval history- no acute issues since last visit. Denies any fever, cough, cold. Denies any infections or hospitalizations. She has mild low back pain  ECOG PS- 2 Pain scale- 3- chronic back pain Opioid associated constipation- no  Review of systems- Review of Systems  Constitutional: Negative for chills, fever, malaise/fatigue and weight loss.  HENT: Negative for congestion, ear discharge and nosebleeds.   Eyes: Negative for blurred vision.  Respiratory: Negative for cough, hemoptysis, sputum production, shortness of breath and wheezing.   Cardiovascular: Negative for chest pain, palpitations, orthopnea and claudication.  Gastrointestinal: Negative for abdominal pain, blood in stool, constipation, diarrhea, heartburn, melena, nausea and vomiting.  Genitourinary: Negative for dysuria, flank pain, frequency, hematuria and urgency.  Musculoskeletal: Positive for back pain. Negative for joint pain and myalgias.  Skin: Negative for rash.  Neurological: Negative for dizziness, tingling, focal weakness, seizures, weakness and headaches.  Endo/Heme/Allergies: Does not bruise/bleed easily.  Psychiatric/Behavioral: Negative for depression and suicidal ideas. The patient does not have insomnia.        Allergies  Allergen Reactions   Codeine Nausea Only   Disopyramide     Other reaction(s): Unknown   Ibuprofen Diarrhea   Iodine     blisters   Metformin And Related    Norpace [Disopyramide Phosphate]    Quinidine     Other reaction(s): Unknown   Terfenadine     Other reaction(s): Unknown  Topiramate     Other reaction(s): Other (See Comments) Hair loss   Verapamil     Other reaction(s): Unknown     Past Medical  History:  Diagnosis Date   A-fib (HCC)    Anemia    Arthritis    Breast cancer (HCC) 1978   left breast with lymph node removal   CHF (congestive heart failure) (HCC)    Diabetes mellitus without complication (HCC)    Diverticulitis    Dysrhythmia    GERD (gastroesophageal reflux disease)    Hyperlipidemia    Macular degeneration of both eyes    Mitral valve prolapse    Mitral valve regurgitation    Presence of permanent cardiac pacemaker      Past Surgical History:  Procedure Laterality Date   ABDOMINAL HYSTERECTOMY     APPENDECTOMY     BREAST SURGERY     CARDIAC CATHETERIZATION     ESOPHAGEAL DILATION     EYE SURGERY Bilateral    Cataract Extraction with IOL   INSERT / REPLACE / REMOVE PACEMAKER     IRRIGATION AND DEBRIDEMENT HEMATOMA Left 08/21/2015   Procedure: IRRIGATION AND DEBRIDEMENT HEMATOMA;  Surgeon: Earline Mayotte, MD;  Location: [facility] ORS;  Service: General;  Laterality: Left;   KNEE ARTHROSCOPY Right    LEFT OOPHORECTOMY Left    MASTECTOMY Left 1978   MASTECTOMY Right 1978   OPEN REDUCTION INTERNAL FIXATION (ORIF) DISTAL RADIAL FRACTURE Right 02/14/2018   Procedure: OPEN REDUCTION INTERNAL FIXATION (ORIF) DISTAL RADIAL FRACTURE;  Surgeon: Kennedy Bucker, MD;  Location: [facility] ORS;  Service: Orthopedics;  Laterality: Right;   PACEMAKER INSERTION  08/11/12   TEMPORAL ARTERY BIOPSY / LIGATION     TONSILLECTOMY      Social History   Socioeconomic History   Marital status: Widowed    Spouse name: Not on file   Number of children: 1   Years of education: 10   Highest education level: 11th grade  Occupational History   Not on file  Social Needs   Financial resource strain: Not hard at all   Food insecurity:    Worry: Never true    Inability: Never true   Transportation needs:    Medical: No    Non-medical: No  Tobacco Use   Smoking status: Never Smoker   Smokeless tobacco: Never Used  Substance and Sexual  Activity   Alcohol use: No    Alcohol/week: 0.0 standard drinks   Drug use: No   Sexual activity: Not on file  Lifestyle   Physical activity:    Days per week: 7 days    Minutes per session: 10 min   Stress: Not at all  Relationships   Social connections:    Talks on phone: More than three times a week    Gets together: More than three times a week    Attends religious service: More than 4 times per year    Active member of club or organization: No    Attends meetings of clubs or organizations: Never    Relationship status: Widowed   Intimate partner violence:    Fear of current or ex partner: No    Emotionally abused: No    Physically abused: No    Forced sexual activity: No  Other Topics Concern   Not on file  Social History Narrative   Widowed   1 son   Never smoker or smokeless tobacco user   No alcohol use   Full Code  Family History  Problem Relation Age of Onset   Hypertension Mother      Current Outpatient Medications:    apixaban (ELIQUIS) 2.5 MG TABS tablet, Take 1 tablet (2.5 mg total) by mouth 2 (two) times daily., Disp: 60 tablet, Rfl: 0   atorvastatin (LIPITOR) 20 MG tablet, Take 1 tablet (20 mg total) by mouth at bedtime., Disp: 30 tablet, Rfl: 0   digoxin (LANOXIN) 0.125 MG tablet, Take 1 tablet (0.125 mg total) by mouth every other day. CHF- * Hold for HR < 60, Disp: 15 tablet, Rfl: 0   folic acid (FOLVITE) 1 MG tablet, Take 1 tablet (1 mg total) by mouth daily., Disp: 30 tablet, Rfl: 3   glimepiride (AMARYL) 2 MG tablet, Take 1 tablet (2 mg total) by mouth 2 (two) times daily., Disp: 60 tablet, Rfl: 0   lidocaine (LIDODERM) 5 %, Place 1 patch onto the skin daily. Apply patch to painful area in the  and remove at bedtime, Disp: 30 patch, Rfl: 0   metolazone (ZAROXOLYN) 2.5 MG tablet, Take 1 tablet (2.5 mg total) by mouth once a week. 1 tablet on Sunday, Disp: 4 tablet, Rfl: 0   metoprolol succinate (TOPROL-XL) 50 MG 24 hr tablet, Take  1 tablet (50 mg total) by mouth daily., Disp: 30 tablet, Rfl: 0   mirtazapine (REMERON) 7.5 MG tablet, Take 1 tablet (7.5 mg total) by mouth at bedtime., Disp: 30 tablet, Rfl: 0   Multiple Vitamins-Minerals (PRESERVISION AREDS 2 PO), Take 1 tablet by mouth 2 (two) times daily., Disp: , Rfl:    mycophenolate (CELLCEPT) 500 MG tablet, Take 1 tablet (500 mg total) by mouth 2 (two) times daily., Disp: 60 tablet, Rfl: 0   NON FORMULARY, Diet Type: Mechanical soft chopped, nectar thick liquids, NCS, Disp: , Rfl:    potassium chloride (K-DUR,KLOR-CON) 10 MEQ tablet, Take 4 tablets (40 mEq total) by mouth daily., Disp: 120 tablet, Rfl: 0   sitaGLIPtin (JANUVIA) 100 MG tablet, Take 1 tablet (100 mg total) by mouth daily., Disp: 30 tablet, Rfl: 0   torsemide (DEMADEX) 20 MG tablet, Take 1 tablet (20 mg total) by mouth daily., Disp: 30 tablet, Rfl: 0  Physical exam:  Vitals:   07/14/18 0941  BP: 130/71  Pulse: 79  Resp: 16  Temp: (!) 96.6 F (35.9 C)  TempSrc: Tympanic  Weight: 102 lb 9.6 oz (46.5 kg)  Height: 5\' 4"  (1.626 m)   Physical Exam HENT:     Head: Normocephalic and atraumatic.  Eyes:     Pupils: Pupils are equal, round, and reactive to light.  Neck:     Musculoskeletal: Normal range of motion.  Cardiovascular:     Rate and Rhythm: Normal rate and regular rhythm.     Heart sounds: Normal heart sounds.  Pulmonary:     Effort: Pulmonary effort is normal.     Breath sounds: Normal breath sounds.  Abdominal:     General: Bowel sounds are normal.     Palpations: Abdomen is soft.  Skin:    General: Skin is warm and dry.  Neurological:     Mental Status: She is alert and oriented to person, place, and time.      CMP Latest Ref Rng & Units 07/14/2018  Glucose 70 - 99 mg/dL 811(B)  BUN 8 - 23 mg/dL 14(N)  Creatinine 8.29 - 1.00 mg/dL 5.62(Z)  Sodium 308 - 657 mmol/L 137  Potassium 3.5 - 5.1 mmol/L 4.3  Chloride 98 - 111 mmol/L 104  CO2 22 - 32 mmol/L 25  Calcium 8.9 -  10.3 mg/dL 9.6  Total Protein 6.5 - 8.1 g/dL 6.7  Total Bilirubin 0.3 - 1.2 mg/dL 2.0(H)  Alkaline Phos 38 - 126 U/L 54  AST 15 - 41 U/L 26  ALT 0 - 44 U/L 20   CBC Latest Ref Rng & Units 07/14/2018  WBC 4.0 - 10.5 K/uL 5.6  Hemoglobin 12.0 - 15.0 g/dL 7.0(W)  Hematocrit 23.7 - 46.0 % 29.6(L)  Platelets 150 - 400 K/uL 296    No images are attached to the encounter.  Ct Chest W Contrast  Result Date: 06/15/2018 CLINICAL DATA:  Autoimmune hemolytic anemia, breast cancer. EXAM: CT CHEST WITH CONTRAST TECHNIQUE: Multidetector CT imaging of the chest was performed during intravenous contrast administration. CONTRAST:  60mL OMNIPAQUE IOHEXOL 300 MG/ML  SOLN COMPARISON:  06/30/2015. FINDINGS: Cardiovascular: Atherosclerotic calcification of the aorta and coronary arteries. Heart is moderately enlarged. Small amount of pericardial fluid. Mediastinum/Nodes: 11 mm low-attenuation lesion in the right thyroid, similar. No pathologically enlarged mediastinal, hilar or axillary lymph nodes. Esophagus is grossly unremarkable. Lungs/Pleura: Triangular-shaped subpleural scarring in the apical segment right upper lobe. A few scattered millimetric pulmonary nodules are unchanged. Tiny bilateral pleural effusions, right greater than left. Airway is unremarkable. Upper Abdomen: Visualized portions of the liver and right adrenal gland are unremarkable. Thickening of the body of the left adrenal gland. Mild cortical scarring in the left kidney. Visualized portions of the spleen, pancreas and stomach are grossly unremarkable. Musculoskeletal: Degenerative changes in the spine and shoulders. T3 compression deformity is unchanged. Mild compression of the T12 superior endplate is new but age indeterminate. Old sternal fracture. IMPRESSION: 1. Tiny bilateral pleural effusions, right greater than left. 2. Small pericardial effusion. 3. Aortic atherosclerosis (ICD10-170.0). Coronary artery calcification. Electronically Signed    By: Leanna Battles M.D.   On: 06/15/2018 14:27     Assessment and plan- Patient is a 85 y.o. female with warm autoimmune hemolytic anemia currently on CellCept  Patient is unsure of what medication she is taking and we will call her pharmacy and confirm that she is continuing her CellCept.  Her hemoglobin currently remained stable between 8.5-9.5.  It is 9.7 today.  There is still some ongoing evidence of hemolysis as evidenced by elevated reticulocyte count and bilirubin of 2.  Given her stability of hemoglobin I will not switch her from CellCept to Rituxan at this time with the ongoing COVID pandemic.  She will continue CellCept 500 mg twice daily.  I will see her back in 2 months time with CBC with differential, CMP, reticulocyte count, haptoglobin, LDH.  She was noted to have a mildly elevated serum free light chain of 2 during prior visit and I will repeated in 2 months time.   Visit Diagnosis 1. Autoimmune hemolytic anemia (HCC)   2. High risk medication use      Dr. Owens Shark, MD, MPH [facility] at [facility] [phone] 07/14/2018 9:35 AM

## 2018-07-15 LAB — KAPPA/LAMBDA LIGHT CHAINS
Kappa free light chain: 36 mg/L — ABNORMAL HIGH (ref 3.3–19.4)
Kappa, lambda light chain ratio: 3.24 — ABNORMAL HIGH (ref 0.26–1.65)
Lambda free light chains: 11.1 mg/L (ref 5.7–26.3)

## 2018-07-15 LAB — HAPTOGLOBIN: Haptoglobin: <10 mg/dL — ABNORMAL LOW (ref 41–333)

## 2018-07-18 ENCOUNTER — Ambulatory Visit: Payer: Medicare Other | Admitting: Oncology

## 2018-07-18 ENCOUNTER — Other Ambulatory Visit: Payer: Medicare Other

## 2018-07-27 ENCOUNTER — Other Ambulatory Visit: Payer: Self-pay | Admitting: Adult Health

## 2018-08-18 ENCOUNTER — Other Ambulatory Visit: Payer: Self-pay | Admitting: *Deleted

## 2018-08-18 DIAGNOSIS — D591 Autoimmune hemolytic anemia, unspecified: Secondary | ICD-10-CM

## 2018-08-18 DIAGNOSIS — K921 Melena: Secondary | ICD-10-CM

## 2018-08-19 ENCOUNTER — Other Ambulatory Visit: Payer: Self-pay

## 2018-08-19 ENCOUNTER — Inpatient Hospital Stay: Payer: Medicare Other | Attending: Oncology | Admitting: Oncology

## 2018-08-19 ENCOUNTER — Inpatient Hospital Stay: Payer: Medicare Other

## 2018-08-19 VITALS — BP 114/64 | HR 66 | Temp 96.7°F | Resp 16 | Wt 107.7 lb

## 2018-08-19 DIAGNOSIS — I341 Nonrheumatic mitral (valve) prolapse: Secondary | ICD-10-CM | POA: Diagnosis not present

## 2018-08-19 DIAGNOSIS — I2510 Atherosclerotic heart disease of native coronary artery without angina pectoris: Secondary | ICD-10-CM | POA: Diagnosis not present

## 2018-08-19 DIAGNOSIS — I509 Heart failure, unspecified: Secondary | ICD-10-CM | POA: Diagnosis not present

## 2018-08-19 DIAGNOSIS — Z950 Presence of cardiac pacemaker: Secondary | ICD-10-CM | POA: Diagnosis not present

## 2018-08-19 DIAGNOSIS — I495 Sick sinus syndrome: Secondary | ICD-10-CM | POA: Diagnosis not present

## 2018-08-19 DIAGNOSIS — D591 Autoimmune hemolytic anemia, unspecified: Secondary | ICD-10-CM

## 2018-08-19 DIAGNOSIS — Z79899 Other long term (current) drug therapy: Secondary | ICD-10-CM | POA: Insufficient documentation

## 2018-08-19 DIAGNOSIS — I4891 Unspecified atrial fibrillation: Secondary | ICD-10-CM | POA: Insufficient documentation

## 2018-08-19 DIAGNOSIS — E785 Hyperlipidemia, unspecified: Secondary | ICD-10-CM | POA: Diagnosis not present

## 2018-08-19 DIAGNOSIS — R140 Abdominal distension (gaseous): Secondary | ICD-10-CM | POA: Diagnosis not present

## 2018-08-19 DIAGNOSIS — Z8673 Personal history of transient ischemic attack (TIA), and cerebral infarction without residual deficits: Secondary | ICD-10-CM | POA: Diagnosis not present

## 2018-08-19 DIAGNOSIS — I10 Essential (primary) hypertension: Secondary | ICD-10-CM | POA: Diagnosis not present

## 2018-08-19 DIAGNOSIS — D5919 Other autoimmune hemolytic anemia: Secondary | ICD-10-CM

## 2018-08-19 DIAGNOSIS — Z853 Personal history of malignant neoplasm of breast: Secondary | ICD-10-CM | POA: Diagnosis not present

## 2018-08-19 DIAGNOSIS — Z9012 Acquired absence of left breast and nipple: Secondary | ICD-10-CM | POA: Diagnosis not present

## 2018-08-19 DIAGNOSIS — K921 Melena: Secondary | ICD-10-CM

## 2018-08-19 DIAGNOSIS — K219 Gastro-esophageal reflux disease without esophagitis: Secondary | ICD-10-CM | POA: Diagnosis not present

## 2018-08-19 DIAGNOSIS — E119 Type 2 diabetes mellitus without complications: Secondary | ICD-10-CM | POA: Diagnosis not present

## 2018-08-19 DIAGNOSIS — Z7901 Long term (current) use of anticoagulants: Secondary | ICD-10-CM | POA: Diagnosis not present

## 2018-08-19 LAB — LACTATE DEHYDROGENASE: LDH: 219 U/L — ABNORMAL HIGH (ref 98–192)

## 2018-08-19 LAB — CBC WITH DIFFERENTIAL/PLATELET
Abs Immature Granulocytes: 0.01 10*3/uL (ref 0.00–0.07)
Basophils Absolute: 0 10*3/uL (ref 0.0–0.1)
Basophils Relative: 1 %
Eosinophils Absolute: 0.1 10*3/uL (ref 0.0–0.5)
Eosinophils Relative: 1 %
HCT: 28.4 % — ABNORMAL LOW (ref 36.0–46.0)
Hemoglobin: 9.3 g/dL — ABNORMAL LOW (ref 12.0–15.0)
Immature Granulocytes: 0 %
Lymphocytes Relative: 19 %
Lymphs Abs: 0.9 10*3/uL (ref 0.7–4.0)
MCH: 30.8 pg (ref 26.0–34.0)
MCHC: 32.7 g/dL (ref 30.0–36.0)
MCV: 94 fL (ref 80.0–100.0)
Monocytes Absolute: 0.4 10*3/uL (ref 0.1–1.0)
Monocytes Relative: 9 %
Neutro Abs: 3.3 10*3/uL (ref 1.7–7.7)
Neutrophils Relative %: 70 %
Platelets: 286 10*3/uL (ref 150–400)
RBC: 3.02 MIL/uL — ABNORMAL LOW (ref 3.87–5.11)
RDW: 17.5 % — ABNORMAL HIGH (ref 11.5–15.5)
WBC: 4.7 10*3/uL (ref 4.0–10.5)
nRBC: 0 % (ref 0.0–0.2)

## 2018-08-19 LAB — COMPREHENSIVE METABOLIC PANEL
ALT: 13 U/L (ref 0–44)
AST: 20 U/L (ref 15–41)
Albumin: 4.2 g/dL (ref 3.5–5.0)
Alkaline Phosphatase: 49 U/L (ref 38–126)
Anion gap: 8 (ref 5–15)
BUN: 19 mg/dL (ref 8–23)
CO2: 28 mmol/L (ref 22–32)
Calcium: 9.2 mg/dL (ref 8.9–10.3)
Chloride: 104 mmol/L (ref 98–111)
Creatinine, Ser: 1.14 mg/dL — ABNORMAL HIGH (ref 0.44–1.00)
GFR calc Af Amer: 48 mL/min — ABNORMAL LOW (ref >60)
GFR calc non Af Amer: 42 mL/min — ABNORMAL LOW (ref >60)
Glucose, Bld: 157 mg/dL — ABNORMAL HIGH (ref 70–99)
Potassium: 3.8 mmol/L (ref 3.5–5.1)
Sodium: 140 mmol/L (ref 135–145)
Total Bilirubin: 1.7 mg/dL — ABNORMAL HIGH (ref 0.3–1.2)
Total Protein: 6.7 g/dL (ref 6.5–8.1)

## 2018-08-19 LAB — IRON AND TIBC
Iron: 62 ug/dL (ref 28–170)
Saturation Ratios: 23 % (ref 10.4–31.8)
TIBC: 273 ug/dL (ref 250–450)
UIBC: 211 ug/dL

## 2018-08-19 LAB — RETICULOCYTES
Immature Retic Fract: 12.7 % (ref 2.3–15.9)
RBC.: 3.02 MIL/uL — ABNORMAL LOW (ref 3.87–5.11)
Retic Count, Absolute: 128 10*3/uL (ref 19.0–186.0)
Retic Ct Pct: 4.2 % — ABNORMAL HIGH (ref 0.4–3.1)

## 2018-08-19 LAB — FERRITIN: Ferritin: 163 ng/mL (ref 11–307)

## 2018-08-19 NOTE — Progress Notes (Signed)
Patient has noticed an increase in dark tarry stools and feeling weaker than her usual.  Does not know exactly what medications she is on because she gets meds "in a bubble and not a bottle" and does not have a current med list with her.

## 2018-08-20 LAB — HAPTOGLOBIN: Haptoglobin: <10 mg/dL — ABNORMAL LOW (ref 41–333)

## 2018-08-22 ENCOUNTER — Encounter: Payer: Self-pay | Admitting: Oncology

## 2018-08-22 LAB — KAPPA/LAMBDA LIGHT CHAINS
Kappa free light chain: 30.7 mg/L — ABNORMAL HIGH (ref 3.3–19.4)
Kappa, lambda light chain ratio: 2.4 — ABNORMAL HIGH (ref 0.26–1.65)
Lambda free light chains: 12.8 mg/L (ref 5.7–26.3)

## 2018-08-22 NOTE — Progress Notes (Signed)
Hematology/Oncology Consult note [facility]  Telephone:([phone] Fax:([phone]  Patient Care Team: Danella Penton, MD as PCP - General (Internal Medicine) Danella Penton, MD (Internal Medicine) Lemar Livings Merrily Pew, MD (General Surgery) Dalia Heading, MD as Consulting Physician (Cardiology)   Name of the patient: Elizabeth Novak  [id number]  01-Apr-1924   Date of visit: 08/22/18  Diagnosis-warm autoimmune hemolytic anemia  Chief complaint/ Reason for visit-routine follow-up of warm autoimmune hemolytic anemia  Heme/Onc history: Patient is a 85 year old female with a past medical history significant for hypertension, type 2 diabetes, CAD, hyperlipidemia, history of A. fib on Coumadin, sick sinus syndrome status post pacemaker placement and history of breast cancer status post left mastectomy in the past. She was admitted to [facility] on 04/18/2018 with symptoms of right-sided weakness and expressive a aphasia. She was treated for an acute left MCA ischemic stroke. She was started on apixaban 2.5 mg twice daily and aspirin/Coumadinwas discontinued. During that admission she was also found to have a hemoglobin of 7. Prior to that she had her last normal hemoglobin sometime in June 2019 when it was 13 and then gradually drifted down to 9 there wasalso some concern for GI bleed and she had seen [facility] in the past. Her hemoglobin did improve back up to 13 in August 2019 and remained stable between 12-13 until November 2019  During her [facility] hospitalization patient was diagnosed with warm autoimmune hemolytic anemia. Hematology was consulted and given her recent stroke, underlying diabetes-steroids was not offered as for first-line treatment and she was started on CellCept instead 500 mg twice daily she was supposed to follow-up with [facility] hematology as an outpatient but preferred to get care locally at [facility].  CT chest abdomen and pelvis  did not reveal any evidence of malignancy.Multiple myeloma panel did not reveal any M protein. Serum free light chain ratio was abnormal at 2.18. She is anemic but her hemoglobin is stable around 8.6- 9.1 and not requiring blood transfusion at this time.     Interval history-patient reports some dark tarry stools occasionally over the last 2 to 3 weeks.  She has baseline fatigue and leg swelling.  Also reports new onset abdominal distention and bloating  ECOG PS- 2 Pain scale-0 Opioid associated constipation- no  Review of systems- Review of Systems  Constitutional: Positive for malaise/fatigue.  Cardiovascular: Positive for leg swelling.  Gastrointestinal:       Abdominal distention bloating and dark tarry stools      Allergies  Allergen Reactions  . Codeine Nausea Only  . Disopyramide     Other reaction(s): Unknown  . Ibuprofen Diarrhea  . Iodine     blisters  . Metformin And Related   . Norpace [Disopyramide Phosphate]   . Quinidine     Other reaction(s): Unknown  . Terfenadine     Other reaction(s): Unknown  . Topiramate     Other reaction(s): Other (See Comments) Hair loss  . Verapamil     Other reaction(s): Unknown     Past Medical History:  Diagnosis Date  . A-fib (HCC)   . Anemia   . Arthritis   . Breast cancer (HCC) 1978   left breast with lymph node removal  . CHF (congestive heart failure) (HCC)   . Diabetes mellitus without complication (HCC)   . Diverticulitis   . Dysrhythmia   . GERD (gastroesophageal reflux disease)   . Hyperlipidemia   . Macular degeneration of  both eyes   . Mitral valve prolapse   . Mitral valve regurgitation   . Presence of permanent cardiac pacemaker      Past Surgical History:  Procedure Laterality Date  . ABDOMINAL HYSTERECTOMY    . APPENDECTOMY    . BREAST SURGERY    . CARDIAC CATHETERIZATION    . ESOPHAGEAL DILATION    . EYE SURGERY Bilateral    Cataract Extraction with IOL  . INSERT / REPLACE / REMOVE  PACEMAKER    . IRRIGATION AND DEBRIDEMENT HEMATOMA Left 08/21/2015   Procedure: IRRIGATION AND DEBRIDEMENT HEMATOMA;  Surgeon: Earline Mayotte, MD;  Location: [facility] ORS;  Service: General;  Laterality: Left;  . KNEE ARTHROSCOPY Right   . LEFT OOPHORECTOMY Left   . MASTECTOMY Left 1978  . MASTECTOMY Right 1978  . OPEN REDUCTION INTERNAL FIXATION (ORIF) DISTAL RADIAL FRACTURE Right 02/14/2018   Procedure: OPEN REDUCTION INTERNAL FIXATION (ORIF) DISTAL RADIAL FRACTURE;  Surgeon: Kennedy Bucker, MD;  Location: [facility] ORS;  Service: Orthopedics;  Laterality: Right;  . PACEMAKER INSERTION  08/11/12  . TEMPORAL ARTERY BIOPSY / LIGATION    . TONSILLECTOMY      Social History   Socioeconomic History  . Marital status: Widowed    Spouse name: Not on file  . Number of children: 1  . Years of education: 71  . Highest education level: 11th grade  Occupational History  . Not on file  Social Needs  . Financial resource strain: Not hard at all  . Food insecurity:    Worry: Never true    Inability: Never true  . Transportation needs:    Medical: No    Non-medical: No  Tobacco Use  . Smoking status: Never Smoker  . Smokeless tobacco: Never Used  Substance and Sexual Activity  . Alcohol use: No    Alcohol/week: 0.0 standard drinks  . Drug use: No  . Sexual activity: Not on file  Lifestyle  . Physical activity:    Days per week: 7 days    Minutes per session: 10 min  . Stress: Not at all  Relationships  . Social connections:    Talks on phone: More than three times a week    Gets together: More than three times a week    Attends religious service: More than 4 times per year    Active member of club or organization: No    Attends meetings of clubs or organizations: Never    Relationship status: Widowed  . Intimate partner violence:    Fear of current or ex partner: No    Emotionally abused: No    Physically abused: No    Forced sexual activity: No  Other Topics Concern  . Not on  file  Social History Narrative   Widowed   1 son   Never smoker or smokeless tobacco user   No alcohol use   Full Code    Family History  Problem Relation Age of Onset  . Hypertension Mother      Current Outpatient Medications:  .  apixaban (ELIQUIS) 2.5 MG TABS tablet, Take 1 tablet (2.5 mg total) by mouth 2 (two) times daily., Disp: 60 tablet, Rfl: 0 .  atorvastatin (LIPITOR) 20 MG tablet, Take 1 tablet (20 mg total) by mouth at bedtime., Disp: 30 tablet, Rfl: 0 .  carboxymethylcellulose (REFRESH PLUS) 0.5 % SOLN, 1 drop 3 (three) times daily as needed., Disp: , Rfl:  .  digoxin (LANOXIN) 0.125 MG tablet, Take 1 tablet (  0.125 mg total) by mouth every other day. CHF- * Hold for HR < 60, Disp: 15 tablet, Rfl: 0 .  folic acid (FOLVITE) 1 MG tablet, Take 1 tablet (1 mg total) by mouth daily., Disp: 30 tablet, Rfl: 3 .  glimepiride (AMARYL) 2 MG tablet, Take 1 tablet (2 mg total) by mouth 2 (two) times daily., Disp: 60 tablet, Rfl: 0 .  lidocaine (LIDODERM) 5 %, Place 1 patch onto the skin daily. Apply patch to painful area in the  and remove at bedtime, Disp: 30 patch, Rfl: 0 .  metoprolol succinate (TOPROL-XL) 50 MG 24 hr tablet, Take 1 tablet (50 mg total) by mouth daily., Disp: 30 tablet, Rfl: 0 .  mirtazapine (REMERON) 7.5 MG tablet, Take 1 tablet (7.5 mg total) by mouth at bedtime., Disp: 30 tablet, Rfl: 0 .  Multiple Vitamins-Minerals (PRESERVISION AREDS 2 PO), Take 1 tablet by mouth 2 (two) times daily., Disp: , Rfl:  .  mycophenolate (CELLCEPT) 500 MG tablet, Take 1 tablet (500 mg total) by mouth 2 (two) times daily., Disp: 60 tablet, Rfl: 0 .  pantoprazole (PROTONIX) 40 MG tablet, Take 40 mg by mouth daily., Disp: , Rfl:  .  potassium chloride (K-DUR,KLOR-CON) 10 MEQ tablet, Take 4 tablets (40 mEq total) by mouth daily. (Patient taking differently: Take 50 mEq by mouth daily. ), Disp: 120 tablet, Rfl: 0 .  sitaGLIPtin (JANUVIA) 100 MG tablet, Take 1 tablet (100 mg total) by mouth  daily., Disp: 30 tablet, Rfl: 0 .  spironolactone (ALDACTONE) 25 MG tablet, Take 25 mg by mouth daily., Disp: , Rfl:  .  torsemide (DEMADEX) 20 MG tablet, Take 1 tablet (20 mg total) by mouth daily., Disp: 30 tablet, Rfl: 0 .  ciprofloxacin (CIPRO) 250 MG tablet, Take 250 mg by mouth 2 (two) times daily., Disp: , Rfl:  .  metolazone (ZAROXOLYN) 2.5 MG tablet, Take 1 tablet (2.5 mg total) by mouth once a week. 1 tablet on Sunday, Disp: 4 tablet, Rfl: 0  Physical exam:  Vitals:   08/19/18 0909  BP: 114/64  Pulse: 66  Resp: 16  Temp: (!) 96.7 F (35.9 C)  Weight: 107 lb 11.2 oz (48.9 kg)   Physical Exam Constitutional:      Comments: Frail elderly woman sitting in a wheelchair.  Appears in no acute distress  HENT:     Head: Normocephalic and atraumatic.  Eyes:     Pupils: Pupils are equal, round, and reactive to light.  Neck:     Musculoskeletal: Normal range of motion.  Cardiovascular:     Rate and Rhythm: Normal rate and regular rhythm.     Heart sounds: Murmur present.  Pulmonary:     Effort: Pulmonary effort is normal.     Breath sounds: Normal breath sounds.  Abdominal:     General: Bowel sounds are normal.     Palpations: Abdomen is soft.     Comments: Abdomen appears mildly distended  Musculoskeletal:     Comments: Bilateral significant +2 edema  Skin:    General: Skin is warm and dry.  Neurological:     Mental Status: She is alert and oriented to person, place, and time.      CMP Latest Ref Rng & Units 08/19/2018  Glucose 70 - 99 mg/dL 403(K)  BUN 8 - 23 mg/dL 19  Creatinine 7.42 - 5.95 mg/dL 6.38(V)  Sodium 564 - 332 mmol/L 140  Potassium 3.5 - 5.1 mmol/L 3.8  Chloride 98 - 111 mmol/L 104  CO2 22 - 32 mmol/L 28  Calcium 8.9 - 10.3 mg/dL 9.2  Total Protein 6.5 - 8.1 g/dL 6.7  Total Bilirubin 0.3 - 1.2 mg/dL 5.3(G)  Alkaline Phos 38 - 126 U/L 49  AST 15 - 41 U/L 20  ALT 0 - 44 U/L 13   CBC Latest Ref Rng & Units 08/19/2018  WBC 4.0 - 10.5 K/uL 4.7   Hemoglobin 12.0 - 15.0 g/dL 6.4(Q)  Hematocrit 03.4 - 46.0 % 28.4(L)  Platelets 150 - 400 K/uL 286    Assessment and plan- Patient is a 85 y.o. female with warm autoimmune hemolytic anemia currently on CellCept  1.  Despite history of dark tarry stools patient does not have any evidence of iron deficiency in her labs.  Also her hemoglobin has remained stable at 9.3.  There is continued evidence of ongoing hemolysis as evidenced by Increased reticulocyte count and haptoglobin of less than 10.  However given the stability of her hemoglobin she will continue to be on CellCept at this time and I will not be switching her to Rituxan just yet.  I will see her back in 4 to 6 weeks as scheduled.  2.  Abdominal distention: I will proceed with ultrasound of the abdomen at this time   Visit Diagnosis 1. Abdominal bloating   2. High risk medication use   3. On Cellcept therapy   4. Other autoimmune hemolytic anemias (HCC)      Dr. Owens Shark, MD, MPH [facility] at [facility] [phone] 08/22/2018 9:08 AM

## 2018-08-23 ENCOUNTER — Ambulatory Visit
Admission: RE | Admit: 2018-08-23 | Discharge: 2018-08-23 | Disposition: A | Discharge status: Home / Self Care | Payer: Medicare Other | Source: Ambulatory Visit | Attending: Oncology | Admitting: Oncology

## 2018-08-23 ENCOUNTER — Other Ambulatory Visit: Payer: Self-pay

## 2018-08-23 DIAGNOSIS — R140 Abdominal distension (gaseous): Secondary | ICD-10-CM | POA: Diagnosis not present

## 2018-08-24 ENCOUNTER — Telehealth: Payer: Self-pay | Admitting: *Deleted

## 2018-08-24 NOTE — Telephone Encounter (Signed)
Patient called to request results from ultrasound performed yesterday.

## 2018-08-24 NOTE — Telephone Encounter (Signed)
Dr. Janese Banks says the results:  U/s of  abdomen did not show any mass or swelling. She had gallstones but this is unchanged from prior scan.  She says that she may have hemorrhoids- 2 days in a row in a bowel movement she has seen blood. She does not know why it is happening. She wears depends and she does not strain and her bowels move without her knowing. She wanted to see if the u/s would show why she was having blood in stool

## 2018-08-25 ENCOUNTER — Telehealth: Payer: Self-pay | Admitting: *Deleted

## 2018-08-25 NOTE — Telephone Encounter (Signed)
Usg would not show why she is having blood in stool. If she has seen GI she should get in touch with them or talk to her pcp. Thanks, Astrid Divine

## 2018-08-25 NOTE — Addendum Note (Signed)
Addended by: Luella Cook on: 08/25/2018 11:31 PM   Modules accepted: Orders

## 2018-08-25 NOTE — Telephone Encounter (Signed)
I called patient back due to her concern about having blood in her depends at times. She thought she may have hemorrhoids.  I had spoke to [place] and she said she will need to see GI.  I called GI and got her an appt 5/28 2:30 and gave this info to patient. At first she said it was not much blood in the depends, then she said she already has appt with them but it was a different person. She was confusing monica elliott with someone who works in Dr. [employer] office. She will make the appt and if the date and time does not work out or she changes her mind I gave her the number to GI and told her if she changes her mind and does not want to go she will have to call 24 hours before appt. She was agreeable

## 2018-09-02 ENCOUNTER — Other Ambulatory Visit
Admission: RE | Admit: 2018-09-02 | Discharge: 2018-09-02 | Disposition: A | Discharge status: Home / Self Care | Payer: Medicare Other | Source: Ambulatory Visit | Attending: Student | Admitting: Student

## 2018-09-02 DIAGNOSIS — K529 Noninfective gastroenteritis and colitis, unspecified: Secondary | ICD-10-CM | POA: Diagnosis present

## 2018-09-02 LAB — GASTROINTESTINAL PANEL BY PCR, STOOL (REPLACES STOOL CULTURE)

## 2018-09-02 LAB — C DIFFICILE QUICK SCREEN W PCR REFLEX
C Diff antigen: NEGATIVE
C Diff interpretation: NOT DETECTED
C Diff toxin: NEGATIVE

## 2018-09-04 LAB — H. PYLORI ANTIGEN, STOOL: H. Pylori Stool Ag, Eia: NEGATIVE

## 2018-09-05 ENCOUNTER — Other Ambulatory Visit: Payer: Self-pay | Admitting: Adult Health

## 2018-09-07 LAB — PANCREATIC ELASTASE, FECAL: Pancreatic Elastase-1, Stool: >500 ug Elast./g (ref >200)

## 2018-09-09 LAB — CALPROTECTIN, FECAL: Calprotectin, Fecal: 62 ug/g (ref 0–120)

## 2018-09-13 ENCOUNTER — Other Ambulatory Visit: Payer: Self-pay | Admitting: *Deleted

## 2018-09-13 ENCOUNTER — Ambulatory Visit: Payer: Medicare Other | Admitting: Oncology

## 2018-09-13 ENCOUNTER — Inpatient Hospital Stay: Payer: Medicare Other | Attending: Oncology

## 2018-09-13 ENCOUNTER — Other Ambulatory Visit: Payer: Medicare Other

## 2018-09-13 ENCOUNTER — Other Ambulatory Visit: Payer: Self-pay

## 2018-09-13 ENCOUNTER — Inpatient Hospital Stay (HOSPITAL_BASED_OUTPATIENT_CLINIC_OR_DEPARTMENT_OTHER): Payer: Medicare Other | Admitting: Oncology

## 2018-09-13 VITALS — BP 152/73 | HR 79 | Temp 97.3°F | Resp 18 | Ht 64.0 in | Wt 104.5 lb

## 2018-09-13 DIAGNOSIS — Z8673 Personal history of transient ischemic attack (TIA), and cerebral infarction without residual deficits: Secondary | ICD-10-CM

## 2018-09-13 DIAGNOSIS — I509 Heart failure, unspecified: Secondary | ICD-10-CM | POA: Insufficient documentation

## 2018-09-13 DIAGNOSIS — E119 Type 2 diabetes mellitus without complications: Secondary | ICD-10-CM | POA: Insufficient documentation

## 2018-09-13 DIAGNOSIS — Z79899 Other long term (current) drug therapy: Secondary | ICD-10-CM | POA: Insufficient documentation

## 2018-09-13 DIAGNOSIS — I10 Essential (primary) hypertension: Secondary | ICD-10-CM | POA: Insufficient documentation

## 2018-09-13 DIAGNOSIS — R5383 Other fatigue: Secondary | ICD-10-CM

## 2018-09-13 DIAGNOSIS — Z9013 Acquired absence of bilateral breasts and nipples: Secondary | ICD-10-CM | POA: Diagnosis not present

## 2018-09-13 DIAGNOSIS — R531 Weakness: Secondary | ICD-10-CM | POA: Insufficient documentation

## 2018-09-13 DIAGNOSIS — K219 Gastro-esophageal reflux disease without esophagitis: Secondary | ICD-10-CM | POA: Insufficient documentation

## 2018-09-13 DIAGNOSIS — M7989 Other specified soft tissue disorders: Secondary | ICD-10-CM

## 2018-09-13 DIAGNOSIS — Z853 Personal history of malignant neoplasm of breast: Secondary | ICD-10-CM | POA: Diagnosis not present

## 2018-09-13 DIAGNOSIS — I4891 Unspecified atrial fibrillation: Secondary | ICD-10-CM

## 2018-09-13 DIAGNOSIS — D591 Autoimmune hemolytic anemia, unspecified: Secondary | ICD-10-CM

## 2018-09-13 DIAGNOSIS — I495 Sick sinus syndrome: Secondary | ICD-10-CM | POA: Diagnosis not present

## 2018-09-13 DIAGNOSIS — I341 Nonrheumatic mitral (valve) prolapse: Secondary | ICD-10-CM | POA: Diagnosis not present

## 2018-09-13 DIAGNOSIS — E785 Hyperlipidemia, unspecified: Secondary | ICD-10-CM | POA: Insufficient documentation

## 2018-09-13 DIAGNOSIS — I2510 Atherosclerotic heart disease of native coronary artery without angina pectoris: Secondary | ICD-10-CM | POA: Diagnosis not present

## 2018-09-13 DIAGNOSIS — Z7901 Long term (current) use of anticoagulants: Secondary | ICD-10-CM | POA: Diagnosis not present

## 2018-09-13 DIAGNOSIS — Z90710 Acquired absence of both cervix and uterus: Secondary | ICD-10-CM | POA: Insufficient documentation

## 2018-09-13 LAB — RETICULOCYTES
Immature Retic Fract: 6.9 % (ref 2.3–15.9)
RBC.: 3.56 MIL/uL — ABNORMAL LOW (ref 3.87–5.11)
Retic Count, Absolute: 81.9 10*3/uL (ref 19.0–186.0)
Retic Ct Pct: 2.3 % (ref 0.4–3.1)

## 2018-09-13 LAB — CBC WITH DIFFERENTIAL/PLATELET
Abs Immature Granulocytes: 0.01 10*3/uL (ref 0.00–0.07)
Basophils Absolute: 0 10*3/uL (ref 0.0–0.1)
Basophils Relative: 1 %
Eosinophils Absolute: 0.1 10*3/uL (ref 0.0–0.5)
Eosinophils Relative: 1 %
HCT: 33.2 % — ABNORMAL LOW (ref 36.0–46.0)
Hemoglobin: 10.5 g/dL — ABNORMAL LOW (ref 12.0–15.0)
Immature Granulocytes: 0 %
Lymphocytes Relative: 22 %
Lymphs Abs: 1 10*3/uL (ref 0.7–4.0)
MCH: 29.5 pg (ref 26.0–34.0)
MCHC: 31.6 g/dL (ref 30.0–36.0)
MCV: 93.3 fL (ref 80.0–100.0)
Monocytes Absolute: 0.4 10*3/uL (ref 0.1–1.0)
Monocytes Relative: 8 %
Neutro Abs: 3.1 10*3/uL (ref 1.7–7.7)
Neutrophils Relative %: 68 %
Platelets: 227 10*3/uL (ref 150–400)
RBC: 3.56 MIL/uL — ABNORMAL LOW (ref 3.87–5.11)
RDW: 15.2 % (ref 11.5–15.5)
WBC: 4.5 10*3/uL (ref 4.0–10.5)
nRBC: 0 % (ref 0.0–0.2)

## 2018-09-13 NOTE — Progress Notes (Signed)
Hematology/Oncology Consult note [facility]  [phone] [phone]  Patient Care Team: Danella Penton, MD as PCP - General (Internal Medicine) Danella Penton, MD (Internal Medicine) Lemar Livings Merrily Pew, MD (General Surgery) Dalia Heading, MD as Consulting Physician (Cardiology)   Name of the patient: Elizabeth Novak  [id number]  May 12, 1923   Date of visit: 09/13/18  Diagnosis-warm autoimmune hemolytic anemia  Chief complaint/ Reason for visit-routine follow-up of warm autoimmune hemolytic anemia  Heme/Onc history:  Patient is a 85 year old female with a past medical history significant for hypertension, type 2 diabetes, CAD, hyperlipidemia, history of A. fib on Coumadin, sick sinus syndrome status post pacemaker placement and history of breast cancer status post left mastectomy in the past. She was admitted to [facility] on 04/18/2018 with symptoms of right-sided weakness and expressive a aphasia. She was treated for an acute left MCA ischemic stroke. She was started on apixaban 2.5 mg twice daily and aspirin/Coumadinwas discontinued. During that admission she was also found to have a hemoglobin of 7. Prior to that she had her last normal hemoglobin sometime in June 2019 when it was 13 and then gradually drifted down to 9 there wasalso some concern for GI bleed and she had seen [facility] in the past. Her hemoglobin did improve back up to 13 in August 2019 and remained stable between 12-13 until November 2019  During her [facility] hospitalization patient was diagnosed with warm autoimmune hemolytic anemia. Hematology was consulted and given her recent stroke, underlying diabetes-steroids was not offered as for first-line treatment and she was started on CellCept instead 500 mg twice daily she was supposed to follow-up with [facility] hematology as an outpatient but preferred to get care locally at [facility].CT chest abdomen and pelvis  did not reveal any evidence of malignancy.Multiple myeloma panel did not reveal any M protein. Serum free light chain ratio was abnormal at 2.18. She is anemic but her hemoglobin is stable around 8.6- 9.1and not requiring blood transfusion at this time.    Interval history-she continues to have some abdominal discomfort and has been following up with her no revealing GI.  She is currently on laxatives.  Leg swelling has been stable.  Denies any blood in her stool or urine.  ECOG PS- 2 Pain scale- 0 Opioid associated constipation- no  Review of systems- Review of Systems  Constitutional: Positive for malaise/fatigue. Negative for chills, fever and weight loss.  HENT: Negative for congestion, ear discharge and nosebleeds.   Eyes: Negative for blurred vision.  Respiratory: Negative for cough, hemoptysis, sputum production, shortness of breath and wheezing.   Cardiovascular: Positive for leg swelling. Negative for chest pain, palpitations, orthopnea and claudication.  Gastrointestinal: Positive for abdominal pain. Negative for blood in stool, constipation, diarrhea, heartburn, melena, nausea and vomiting.  Genitourinary: Negative for dysuria, flank pain, frequency, hematuria and urgency.  Musculoskeletal: Negative for back pain, joint pain and myalgias.  Skin: Negative for rash.  Neurological: Negative for dizziness, tingling, focal weakness, seizures, weakness and headaches.  Endo/Heme/Allergies: Does not bruise/bleed easily.  Psychiatric/Behavioral: Negative for depression and suicidal ideas. The patient does not have insomnia.        Allergies  Allergen Reactions  . Codeine Nausea Only  . Disopyramide     Other reaction(s): Unknown  . Ibuprofen Diarrhea  . Iodine     blisters  . Metformin And Related   . Norpace [Disopyramide Phosphate]   . Quinidine     Other  reaction(s): Unknown  . Terfenadine     Other reaction(s): Unknown  . Topiramate     Other reaction(s): Other (See  Comments) Hair loss  . Verapamil     Other reaction(s): Unknown     Past Medical History:  Diagnosis Date  . A-fib (HCC)   . Anemia   . Arthritis   . Breast cancer (HCC) 1978   left breast with lymph node removal  . CHF (congestive heart failure) (HCC)   . Diabetes mellitus without complication (HCC)   . Diverticulitis   . Dysrhythmia   . GERD (gastroesophageal reflux disease)   . Hyperlipidemia   . Macular degeneration of both eyes   . Mitral valve prolapse   . Mitral valve regurgitation   . Presence of permanent cardiac pacemaker      Past Surgical History:  Procedure Laterality Date  . ABDOMINAL HYSTERECTOMY    . APPENDECTOMY    . BREAST SURGERY    . CARDIAC CATHETERIZATION    . ESOPHAGEAL DILATION    . EYE SURGERY Bilateral    Cataract Extraction with IOL  . INSERT / REPLACE / REMOVE PACEMAKER    . IRRIGATION AND DEBRIDEMENT HEMATOMA Left 08/21/2015   Procedure: IRRIGATION AND DEBRIDEMENT HEMATOMA;  Surgeon: Earline Mayotte, MD;  Location: [facility] ORS;  Service: General;  Laterality: Left;  . KNEE ARTHROSCOPY Right   . LEFT OOPHORECTOMY Left   . MASTECTOMY Left 1978  . MASTECTOMY Right 1978  . OPEN REDUCTION INTERNAL FIXATION (ORIF) DISTAL RADIAL FRACTURE Right 02/14/2018   Procedure: OPEN REDUCTION INTERNAL FIXATION (ORIF) DISTAL RADIAL FRACTURE;  Surgeon: Kennedy Bucker, MD;  Location: [facility] ORS;  Service: Orthopedics;  Laterality: Right;  . PACEMAKER INSERTION  08/11/12  . TEMPORAL ARTERY BIOPSY / LIGATION    . TONSILLECTOMY      Social History   Socioeconomic History  . Marital status: Widowed    Spouse name: Not on file  . Number of children: 1  . Years of education: 51  . Highest education level: 11th grade  Occupational History  . Not on file  Social Needs  . Financial resource strain: Not hard at all  . Food insecurity:    Worry: Never true    Inability: Never true  . Transportation needs:    Medical: No    Non-medical: No  Tobacco Use  .  Smoking status: Never Smoker  . Smokeless tobacco: Never Used  Substance and Sexual Activity  . Alcohol use: No    Alcohol/week: 0.0 standard drinks  . Drug use: No  . Sexual activity: Not on file  Lifestyle  . Physical activity:    Days per week: 7 days    Minutes per session: 10 min  . Stress: Not at all  Relationships  . Social connections:    Talks on phone: More than three times a week    Gets together: More than three times a week    Attends religious service: More than 4 times per year    Active member of club or organization: No    Attends meetings of clubs or organizations: Never    Relationship status: Widowed  . Intimate partner violence:    Fear of current or ex partner: No    Emotionally abused: No    Physically abused: No    Forced sexual activity: No  Other Topics Concern  . Not on file  Social History Narrative   Widowed   1 son   Never smoker or  smokeless tobacco user   No alcohol use   Full Code    Family History  Problem Relation Age of Onset  . Hypertension Mother      Current Outpatient Medications:  .  apixaban (ELIQUIS) 2.5 MG TABS tablet, Take 1 tablet (2.5 mg total) by mouth 2 (two) times daily., Disp: 60 tablet, Rfl: 0 .  atorvastatin (LIPITOR) 20 MG tablet, Take 1 tablet (20 mg total) by mouth at bedtime., Disp: 30 tablet, Rfl: 0 .  carboxymethylcellulose (REFRESH PLUS) 0.5 % SOLN, 1 drop 3 (three) times daily as needed., Disp: , Rfl:  .  digoxin (LANOXIN) 0.125 MG tablet, Take 1 tablet (0.125 mg total) by mouth every other day. CHF- * Hold for HR < 60, Disp: 15 tablet, Rfl: 0 .  folic acid (FOLVITE) 1 MG tablet, Take 1 tablet (1 mg total) by mouth daily., Disp: 30 tablet, Rfl: 3 .  glimepiride (AMARYL) 2 MG tablet, Take 1 tablet (2 mg total) by mouth 2 (two) times daily., Disp: 60 tablet, Rfl: 0 .  lidocaine (LIDODERM) 5 %, Place 1 patch onto the skin daily. Apply patch to painful area in the  and remove at bedtime, Disp: 30 patch, Rfl: 0 .   metolazone (ZAROXOLYN) 2.5 MG tablet, Take 1 tablet (2.5 mg total) by mouth once a week. 1 tablet on Sunday, Disp: 4 tablet, Rfl: 0 .  metoprolol succinate (TOPROL-XL) 50 MG 24 hr tablet, Take 1 tablet (50 mg total) by mouth daily., Disp: 30 tablet, Rfl: 0 .  mirtazapine (REMERON) 7.5 MG tablet, Take 1 tablet (7.5 mg total) by mouth at bedtime., Disp: 30 tablet, Rfl: 0 .  Multiple Vitamins-Minerals (PRESERVISION AREDS 2 PO), Take 1 tablet by mouth 2 (two) times daily., Disp: , Rfl:  .  mycophenolate (CELLCEPT) 500 MG tablet, Take 1 tablet (500 mg total) by mouth 2 (two) times daily., Disp: 60 tablet, Rfl: 0 .  pantoprazole (PROTONIX) 40 MG tablet, Take 40 mg by mouth daily., Disp: , Rfl:  .  polyethylene glycol (MIRALAX / GLYCOLAX) 17 g packet, Take 17 g by mouth daily as needed., Disp: , Rfl:  .  potassium chloride (K-DUR,KLOR-CON) 10 MEQ tablet, Take 4 tablets (40 mEq total) by mouth daily. (Patient taking differently: Take 50 mEq by mouth daily. ), Disp: 120 tablet, Rfl: 0  Physical exam:  Vitals:   09/13/18 1033  BP: (!) 152/73  Pulse: 79  Resp: 18  Temp: (!) 97.3 F (36.3 C)  TempSrc: Tympanic  Weight: 104 lb 8 oz (47.4 kg)  Height: 5\' 4"  (1.626 m)   Physical Exam Constitutional:      Comments: Thin elderly frail woman in no acute distress  HENT:     Head: Normocephalic and atraumatic.  Eyes:     Pupils: Pupils are equal, round, and reactive to light.  Neck:     Musculoskeletal: Normal range of motion.  Cardiovascular:     Rate and Rhythm: Normal rate and regular rhythm.     Heart sounds: Normal heart sounds.  Pulmonary:     Effort: Pulmonary effort is normal.     Breath sounds: Normal breath sounds.  Abdominal:     General: Bowel sounds are normal.     Palpations: Abdomen is soft.  Musculoskeletal:     Comments: Bilateral +1 edema  Skin:    General: Skin is warm and dry.  Neurological:     Mental Status: She is alert and oriented to person, place, and time.  CMP Latest Ref Rng & Units 08/19/2018  Glucose 70 - 99 mg/dL 102(V)  BUN 8 - 23 mg/dL 19  Creatinine 2.53 - 6.64 mg/dL 4.03(K)  Sodium 742 - 595 mmol/L 140  Potassium 3.5 - 5.1 mmol/L 3.8  Chloride 98 - 111 mmol/L 104  CO2 22 - 32 mmol/L 28  Calcium 8.9 - 10.3 mg/dL 9.2  Total Protein 6.5 - 8.1 g/dL 6.7  Total Bilirubin 0.3 - 1.2 mg/dL 6.3(O)  Alkaline Phos 38 - 126 U/L 49  AST 15 - 41 U/L 20  ALT 0 - 44 U/L 13   CBC Latest Ref Rng & Units 09/13/2018  WBC 4.0 - 10.5 K/uL 4.5  Hemoglobin 12.0 - 15.0 g/dL 10.5(L)  Hematocrit 36.0 - 46.0 % 33.2(L)  Platelets 150 - 400 K/uL 227    No images are attached to the encounter.  US Abdomen Complete  Result Date: 08/23/2018 CLINICAL DATA:  Abdominal distension for several months EXAM: ABDOMEN ULTRASOUND COMPLETE COMPARISON:  06/30/15 FINDINGS: Gallbladder: Well distended with multiple gallstones consistent with that seen on the prior CT examination. No wall thickness or pericholecystic fluid is noted. Negative sonographic Eulah Pont sign is elicited. Common bile duct: Diameter: 3 mm. Liver: No focal lesion identified. Within normal limits in parenchymal echogenicity. Portal vein is patent on color Doppler imaging with normal direction of blood flow towards the liver. IVC: No abnormality visualized. Pancreas: Visualized portion unremarkable. Spleen: Size and appearance within normal limits. Right Kidney: Length: 8.7 cm. Echogenicity within normal limits. No mass or hydronephrosis visualized. Left Kidney: Length: 8.9 cm. Echogenicity within normal limits. No mass or hydronephrosis visualized. Abdominal aorta: No aneurysm visualized. Other findings: None. IMPRESSION: Cholelithiasis without complicating factors. No new focal abnormality is noted. Electronically Signed   By: Alcide Clever M.D.   On: 08/23/2018 16:09     Assessment and plan- Patient is a 85 y.o. female with warm autoimmune hemolytic anemia currently on CellCept.  This is a routine follow-up  visit  Overall hemoglobin remained stable between 9-10.  Reticulocyte count is coming down and haptoglobin is currently pending.  She will continue to take CellCept 500 mg twice daily.  Repeat CBC, reticulocyte count, CMP and haptoglobin in 2 months in 4 months and I will see her back in 4 months   Visit Diagnosis 1. Autoimmune hemolytic anemia (HCC)      Dr. Owens Shark, MD, MPH [facility] [phone] 09/13/2018 2:05 PM

## 2018-09-13 NOTE — Progress Notes (Signed)
Pt here for f/u and we had cancelled the appt because we saw her as a add on a month ago. The patient did not remember that it was cancelled so we added her back on. She is doing ok from the point of view of her anemia. She has been changed to new diurectic and she did see gi and they told her to take miralax as needed for a good BM. It has been working for her

## 2018-09-14 LAB — HAPTOGLOBIN: Haptoglobin: 25 mg/dL — ABNORMAL LOW (ref 41–333)

## 2018-10-14 ENCOUNTER — Telehealth: Payer: Self-pay | Admitting: *Deleted

## 2018-10-14 NOTE — Telephone Encounter (Signed)
Call returned to patient and she was advised to call her PCP regarding this matter, she was reluctant stating she wanted to see Dr Janese Banks, but said she has an appointment Monday and would call Dr Sabra Heck

## 2018-10-14 NOTE — Telephone Encounter (Signed)
She needs to call her pcp about this

## 2018-10-14 NOTE — Telephone Encounter (Signed)
Patient called reporting that she has had an upset stomach and Dr Sabra Heck told her to take Pepto Bismol for it. She rports that she has been having black stools for several days and that today she had a very large black stool and asks what to do about it. Please advise. could this could be from the pepto?

## 2018-10-17 ENCOUNTER — Ambulatory Visit: Payer: Medicare Other | Admitting: Oncology

## 2018-10-17 ENCOUNTER — Encounter: Payer: Self-pay | Admitting: Oncology

## 2018-10-17 ENCOUNTER — Other Ambulatory Visit: Payer: Self-pay

## 2018-10-17 ENCOUNTER — Other Ambulatory Visit: Payer: Medicare Other

## 2018-10-17 ENCOUNTER — Inpatient Hospital Stay (HOSPITAL_BASED_OUTPATIENT_CLINIC_OR_DEPARTMENT_OTHER): Payer: Medicare Other | Admitting: Oncology

## 2018-10-17 ENCOUNTER — Inpatient Hospital Stay: Payer: Medicare Other | Attending: Oncology

## 2018-10-17 VITALS — BP 146/71 | HR 68 | Temp 96.0°F | Wt 103.5 lb

## 2018-10-17 DIAGNOSIS — I509 Heart failure, unspecified: Secondary | ICD-10-CM | POA: Insufficient documentation

## 2018-10-17 DIAGNOSIS — Z79899 Other long term (current) drug therapy: Secondary | ICD-10-CM

## 2018-10-17 DIAGNOSIS — Z8673 Personal history of transient ischemic attack (TIA), and cerebral infarction without residual deficits: Secondary | ICD-10-CM

## 2018-10-17 DIAGNOSIS — I4891 Unspecified atrial fibrillation: Secondary | ICD-10-CM | POA: Insufficient documentation

## 2018-10-17 DIAGNOSIS — I341 Nonrheumatic mitral (valve) prolapse: Secondary | ICD-10-CM

## 2018-10-17 DIAGNOSIS — Z950 Presence of cardiac pacemaker: Secondary | ICD-10-CM | POA: Diagnosis not present

## 2018-10-17 DIAGNOSIS — Z7984 Long term (current) use of oral hypoglycemic drugs: Secondary | ICD-10-CM | POA: Diagnosis not present

## 2018-10-17 DIAGNOSIS — M129 Arthropathy, unspecified: Secondary | ICD-10-CM

## 2018-10-17 DIAGNOSIS — E119 Type 2 diabetes mellitus without complications: Secondary | ICD-10-CM | POA: Diagnosis not present

## 2018-10-17 DIAGNOSIS — R5383 Other fatigue: Secondary | ICD-10-CM | POA: Insufficient documentation

## 2018-10-17 DIAGNOSIS — R531 Weakness: Secondary | ICD-10-CM | POA: Insufficient documentation

## 2018-10-17 DIAGNOSIS — Z7901 Long term (current) use of anticoagulants: Secondary | ICD-10-CM | POA: Diagnosis not present

## 2018-10-17 DIAGNOSIS — E785 Hyperlipidemia, unspecified: Secondary | ICD-10-CM | POA: Insufficient documentation

## 2018-10-17 DIAGNOSIS — Z9012 Acquired absence of left breast and nipple: Secondary | ICD-10-CM

## 2018-10-17 DIAGNOSIS — K219 Gastro-esophageal reflux disease without esophagitis: Secondary | ICD-10-CM | POA: Diagnosis not present

## 2018-10-17 DIAGNOSIS — D591 Autoimmune hemolytic anemia, unspecified: Secondary | ICD-10-CM

## 2018-10-17 DIAGNOSIS — I10 Essential (primary) hypertension: Secondary | ICD-10-CM

## 2018-10-17 DIAGNOSIS — D5919 Other autoimmune hemolytic anemia: Secondary | ICD-10-CM

## 2018-10-17 LAB — COMPREHENSIVE METABOLIC PANEL
ALT: 15 U/L (ref 0–44)
AST: 21 U/L (ref 15–41)
Albumin: 4.2 g/dL (ref 3.5–5.0)
Alkaline Phosphatase: 52 U/L (ref 38–126)
Anion gap: 7 (ref 5–15)
BUN: 20 mg/dL (ref 8–23)
CO2: 27 mmol/L (ref 22–32)
Calcium: 9.3 mg/dL (ref 8.9–10.3)
Chloride: 105 mmol/L (ref 98–111)
Creatinine, Ser: 0.87 mg/dL (ref 0.44–1.00)
GFR calc Af Amer: >60 mL/min (ref >60)
GFR calc non Af Amer: 58 mL/min — ABNORMAL LOW (ref >60)
Glucose, Bld: 280 mg/dL — ABNORMAL HIGH (ref 70–99)
Potassium: 3.5 mmol/L (ref 3.5–5.1)
Sodium: 139 mmol/L (ref 135–145)
Total Bilirubin: 1.2 mg/dL (ref 0.3–1.2)
Total Protein: 6.9 g/dL (ref 6.5–8.1)

## 2018-10-17 LAB — CBC WITH DIFFERENTIAL/PLATELET
Abs Immature Granulocytes: 0.01 10*3/uL (ref 0.00–0.07)
Basophils Absolute: 0 10*3/uL (ref 0.0–0.1)
Basophils Relative: 1 %
Eosinophils Absolute: 0 10*3/uL (ref 0.0–0.5)
Eosinophils Relative: 1 %
HCT: 32.5 % — ABNORMAL LOW (ref 36.0–46.0)
Hemoglobin: 10.9 g/dL — ABNORMAL LOW (ref 12.0–15.0)
Immature Granulocytes: 0 %
Lymphocytes Relative: 24 %
Lymphs Abs: 1.1 10*3/uL (ref 0.7–4.0)
MCH: 29.1 pg (ref 26.0–34.0)
MCHC: 33.5 g/dL (ref 30.0–36.0)
MCV: 86.9 fL (ref 80.0–100.0)
Monocytes Absolute: 0.4 10*3/uL (ref 0.1–1.0)
Monocytes Relative: 10 %
Neutro Abs: 2.9 10*3/uL (ref 1.7–7.7)
Neutrophils Relative %: 64 %
Platelets: 254 10*3/uL (ref 150–400)
RBC: 3.74 MIL/uL — ABNORMAL LOW (ref 3.87–5.11)
RDW: 16.2 % — ABNORMAL HIGH (ref 11.5–15.5)
WBC: 4.4 10*3/uL (ref 4.0–10.5)
nRBC: 0 % (ref 0.0–0.2)

## 2018-10-17 LAB — RETICULOCYTES
Immature Retic Fract: 8.7 % (ref 2.3–15.9)
RBC.: 3.74 MIL/uL — ABNORMAL LOW (ref 3.87–5.11)
Retic Count, Absolute: 81.9 10*3/uL (ref 19.0–186.0)
Retic Ct Pct: 2.2 % (ref 0.4–3.1)

## 2018-10-17 LAB — LACTATE DEHYDROGENASE: LDH: 184 U/L (ref 98–192)

## 2018-10-17 NOTE — Progress Notes (Signed)
Hematology/Oncology Consult note [facility]  Telephone:([phone] [phone]  Patient Care Team: Rusty Aus, MD as PCP - General (Internal Medicine) Rusty Aus, MD (Internal Medicine) Bary Castilla Forest Gleason, MD (General Surgery) Teodoro Spray, MD as Consulting Physician (Cardiology)   Name of the patient: Elizabeth Novak  [id number]  02-Nov-1923   Date of visit: 10/17/18  Diagnosis-warm autoimmune hemolytic anemia  Chief complaint/ Reason for visit-routine follow-up of hemolytic anemia  Heme/Onc history: Patient is a 86 year old female with a past medical history significant for hypertension, type 2 diabetes, CAD, hyperlipidemia, history of A. fib on Coumadin, sick sinus syndrome status post pacemaker placement and history of breast cancer status post left mastectomy in the past. She was admitted to [facility] on 04/18/2018 with symptoms of right-sided weakness and expressive a aphasia. She was treated for an acute left MCA ischemic stroke. She was started on apixaban 2.5 mg twice daily and aspirin/Coumadinwas discontinued. During that admission she was also found to have a hemoglobin of 7. Prior to that she had her last normal hemoglobin sometime in June 2019 when it was 13 and then gradually drifted down to 9 there wasalso some concern for GI bleed and she had seen [facility] in the past. Her hemoglobin did improve back up to 13 in August 2019 and remained stable between 12-13 until November 2019  During her [facility] hospitalization patient was diagnosed with warm autoimmune hemolytic anemia. Hematology was consulted and given her recent stroke, underlying diabetes-steroids was not offered as for first-line treatment and she was started on CellCept instead 500 mg twice daily she was supposed to follow-up with [facility] hematology as an outpatient but preferred to get care locally at [facility].CT chest abdomen and pelvis did not reveal  any evidence of malignancy.Multiple myeloma panel did not reveal any M protein. Serum free light chain ratio was abnormal at 2.18. She is anemic but her hemoglobin is stable around 8.6- 9.1and not requiring blood transfusion at this time.    Interval history-patient reports that her stools are dark sometimes black in color.  Denies any bright red blood per rectum.  Overall she feels well.  She has chronic bilateral leg swelling which is stable.  Feels that her abdomen is bloated  ECOG PS- 2 Pain scale- 0   Review of systems- Review of Systems  Constitutional: Positive for malaise/fatigue. Negative for chills, fever and weight loss.  HENT: Negative for congestion, ear discharge and nosebleeds.   Eyes: Negative for blurred vision.  Respiratory: Negative for cough, hemoptysis, sputum production, shortness of breath and wheezing.   Cardiovascular: Positive for leg swelling. Negative for chest pain, palpitations, orthopnea and claudication.  Gastrointestinal: Negative for abdominal pain, blood in stool, constipation, diarrhea, heartburn, melena, nausea and vomiting.  Genitourinary: Negative for dysuria, flank pain, frequency, hematuria and urgency.  Musculoskeletal: Negative for back pain, joint pain and myalgias.  Skin: Negative for rash.  Neurological: Negative for dizziness, tingling, focal weakness, seizures, weakness and headaches.  Endo/Heme/Allergies: Does not bruise/bleed easily.  Psychiatric/Behavioral: Negative for depression and suicidal ideas. The patient does not have insomnia.       Allergies  Allergen Reactions  . Codeine Nausea Only  . Disopyramide     Other reaction(s): Unknown  . Ibuprofen Diarrhea  . Iodine     blisters  . Metformin And Related   . Norpace [Disopyramide Phosphate]   . Quinidine     Other reaction(s): Unknown  . Terfenadine  Hematology/Oncology Consult note [facility]  Telephone:([phone] [phone]  Patient Care Team: Rusty Aus, MD as PCP - General (Internal Medicine) Rusty Aus, MD (Internal Medicine) Bary Castilla Forest Gleason, MD (General Surgery) Teodoro Spray, MD as Consulting Physician (Cardiology)   Name of the patient: Elizabeth Novak  [id number]  23-Dec-1923   Date of visit: 10/17/18  Diagnosis-warm autoimmune hemolytic anemia  Chief complaint/ Reason for visit-routine follow-up of hemolytic anemia  Heme/Onc history: Patient is a 85 year old female with a past medical history significant for hypertension, type 2 diabetes, CAD, hyperlipidemia, history of A. fib on Coumadin, sick sinus syndrome status post pacemaker placement and history of breast cancer status post left mastectomy in the past. She was admitted to [facility] on 04/18/2018 with symptoms of right-sided weakness and expressive a aphasia. She was treated for an acute left MCA ischemic stroke. She was started on apixaban 2.5 mg twice daily and aspirin/Coumadinwas discontinued. During that admission she was also found to have a hemoglobin of 7. Prior to that she had her last normal hemoglobin sometime in June 2019 when it was 13 and then gradually drifted down to 9 there wasalso some concern for GI bleed and she had seen [facility] in the past. Her hemoglobin did improve back up to 13 in August 2019 and remained stable between 12-13 until November 2019  During her [facility] hospitalization patient was diagnosed with warm autoimmune hemolytic anemia. Hematology was consulted and given her recent stroke, underlying diabetes-steroids was not offered as for first-line treatment and she was started on CellCept instead 500 mg twice daily she was supposed to follow-up with [facility] hematology as an outpatient but preferred to get care locally at [facility].CT chest abdomen and pelvis did not reveal  any evidence of malignancy.Multiple myeloma panel did not reveal any M protein. Serum free light chain ratio was abnormal at 2.18. She is anemic but her hemoglobin is stable around 8.6- 9.1and not requiring blood transfusion at this time.    Interval history-patient reports that her stools are dark sometimes black in color.  Denies any bright red blood per rectum.  Overall she feels well.  She has chronic bilateral leg swelling which is stable.  Feels that her abdomen is bloated  ECOG PS- 2 Pain scale- 0   Review of systems- Review of Systems  Constitutional: Positive for malaise/fatigue. Negative for chills, fever and weight loss.  HENT: Negative for congestion, ear discharge and nosebleeds.   Eyes: Negative for blurred vision.  Respiratory: Negative for cough, hemoptysis, sputum production, shortness of breath and wheezing.   Cardiovascular: Positive for leg swelling. Negative for chest pain, palpitations, orthopnea and claudication.  Gastrointestinal: Negative for abdominal pain, blood in stool, constipation, diarrhea, heartburn, melena, nausea and vomiting.  Genitourinary: Negative for dysuria, flank pain, frequency, hematuria and urgency.  Musculoskeletal: Negative for back pain, joint pain and myalgias.  Skin: Negative for rash.  Neurological: Negative for dizziness, tingling, focal weakness, seizures, weakness and headaches.  Endo/Heme/Allergies: Does not bruise/bleed easily.  Psychiatric/Behavioral: Negative for depression and suicidal ideas. The patient does not have insomnia.       Allergies  Allergen Reactions  . Codeine Nausea Only  . Disopyramide     Other reaction(s): Unknown  . Ibuprofen Diarrhea  . Iodine     blisters  . Metformin And Related   . Norpace [Disopyramide Phosphate]   . Quinidine     Other reaction(s): Unknown  . Terfenadine  Hematology/Oncology Consult note [facility]  Telephone:([phone] [phone]  Patient Care Team: Rusty Aus, MD as PCP - General (Internal Medicine) Rusty Aus, MD (Internal Medicine) Bary Castilla Forest Gleason, MD (General Surgery) Teodoro Spray, MD as Consulting Physician (Cardiology)   Name of the patient: Elizabeth Novak  [id number]  23-Dec-1923   Date of visit: 10/17/18  Diagnosis-warm autoimmune hemolytic anemia  Chief complaint/ Reason for visit-routine follow-up of hemolytic anemia  Heme/Onc history: Patient is a 85 year old female with a past medical history significant for hypertension, type 2 diabetes, CAD, hyperlipidemia, history of A. fib on Coumadin, sick sinus syndrome status post pacemaker placement and history of breast cancer status post left mastectomy in the past. She was admitted to [facility] on 04/18/2018 with symptoms of right-sided weakness and expressive a aphasia. She was treated for an acute left MCA ischemic stroke. She was started on apixaban 2.5 mg twice daily and aspirin/Coumadinwas discontinued. During that admission she was also found to have a hemoglobin of 7. Prior to that she had her last normal hemoglobin sometime in June 2019 when it was 13 and then gradually drifted down to 9 there wasalso some concern for GI bleed and she had seen [facility] in the past. Her hemoglobin did improve back up to 13 in August 2019 and remained stable between 12-13 until November 2019  During her [facility] hospitalization patient was diagnosed with warm autoimmune hemolytic anemia. Hematology was consulted and given her recent stroke, underlying diabetes-steroids was not offered as for first-line treatment and she was started on CellCept instead 500 mg twice daily she was supposed to follow-up with [facility] hematology as an outpatient but preferred to get care locally at [facility].CT chest abdomen and pelvis did not reveal  any evidence of malignancy.Multiple myeloma panel did not reveal any M protein. Serum free light chain ratio was abnormal at 2.18. She is anemic but her hemoglobin is stable around 8.6- 9.1and not requiring blood transfusion at this time.    Interval history-patient reports that her stools are dark sometimes black in color.  Denies any bright red blood per rectum.  Overall she feels well.  She has chronic bilateral leg swelling which is stable.  Feels that her abdomen is bloated  ECOG PS- 2 Pain scale- 0   Review of systems- Review of Systems  Constitutional: Positive for malaise/fatigue. Negative for chills, fever and weight loss.  HENT: Negative for congestion, ear discharge and nosebleeds.   Eyes: Negative for blurred vision.  Respiratory: Negative for cough, hemoptysis, sputum production, shortness of breath and wheezing.   Cardiovascular: Positive for leg swelling. Negative for chest pain, palpitations, orthopnea and claudication.  Gastrointestinal: Negative for abdominal pain, blood in stool, constipation, diarrhea, heartburn, melena, nausea and vomiting.  Genitourinary: Negative for dysuria, flank pain, frequency, hematuria and urgency.  Musculoskeletal: Negative for back pain, joint pain and myalgias.  Skin: Negative for rash.  Neurological: Negative for dizziness, tingling, focal weakness, seizures, weakness and headaches.  Endo/Heme/Allergies: Does not bruise/bleed easily.  Psychiatric/Behavioral: Negative for depression and suicidal ideas. The patient does not have insomnia.       Allergies  Allergen Reactions  . Codeine Nausea Only  . Disopyramide     Other reaction(s): Unknown  . Ibuprofen Diarrhea  . Iodine     blisters  . Metformin And Related   . Norpace [Disopyramide Phosphate]   . Quinidine     Other reaction(s): Unknown  . Terfenadine  Hematology/Oncology Consult note [facility]  Telephone:([phone] [phone]  Patient Care Team: Rusty Aus, MD as PCP - General (Internal Medicine) Rusty Aus, MD (Internal Medicine) Bary Castilla Forest Gleason, MD (General Surgery) Teodoro Spray, MD as Consulting Physician (Cardiology)   Name of the patient: Elizabeth Novak  [id number]  02-Nov-1923   Date of visit: 10/17/18  Diagnosis-warm autoimmune hemolytic anemia  Chief complaint/ Reason for visit-routine follow-up of hemolytic anemia  Heme/Onc history: Patient is a 86 year old female with a past medical history significant for hypertension, type 2 diabetes, CAD, hyperlipidemia, history of A. fib on Coumadin, sick sinus syndrome status post pacemaker placement and history of breast cancer status post left mastectomy in the past. She was admitted to [facility] on 04/18/2018 with symptoms of right-sided weakness and expressive a aphasia. She was treated for an acute left MCA ischemic stroke. She was started on apixaban 2.5 mg twice daily and aspirin/Coumadinwas discontinued. During that admission she was also found to have a hemoglobin of 7. Prior to that she had her last normal hemoglobin sometime in June 2019 when it was 13 and then gradually drifted down to 9 there wasalso some concern for GI bleed and she had seen [facility] in the past. Her hemoglobin did improve back up to 13 in August 2019 and remained stable between 12-13 until November 2019  During her [facility] hospitalization patient was diagnosed with warm autoimmune hemolytic anemia. Hematology was consulted and given her recent stroke, underlying diabetes-steroids was not offered as for first-line treatment and she was started on CellCept instead 500 mg twice daily she was supposed to follow-up with [facility] hematology as an outpatient but preferred to get care locally at [facility].CT chest abdomen and pelvis did not reveal  any evidence of malignancy.Multiple myeloma panel did not reveal any M protein. Serum free light chain ratio was abnormal at 2.18. She is anemic but her hemoglobin is stable around 8.6- 9.1and not requiring blood transfusion at this time.    Interval history-patient reports that her stools are dark sometimes black in color.  Denies any bright red blood per rectum.  Overall she feels well.  She has chronic bilateral leg swelling which is stable.  Feels that her abdomen is bloated  ECOG PS- 2 Pain scale- 0   Review of systems- Review of Systems  Constitutional: Positive for malaise/fatigue. Negative for chills, fever and weight loss.  HENT: Negative for congestion, ear discharge and nosebleeds.   Eyes: Negative for blurred vision.  Respiratory: Negative for cough, hemoptysis, sputum production, shortness of breath and wheezing.   Cardiovascular: Positive for leg swelling. Negative for chest pain, palpitations, orthopnea and claudication.  Gastrointestinal: Negative for abdominal pain, blood in stool, constipation, diarrhea, heartburn, melena, nausea and vomiting.  Genitourinary: Negative for dysuria, flank pain, frequency, hematuria and urgency.  Musculoskeletal: Negative for back pain, joint pain and myalgias.  Skin: Negative for rash.  Neurological: Negative for dizziness, tingling, focal weakness, seizures, weakness and headaches.  Endo/Heme/Allergies: Does not bruise/bleed easily.  Psychiatric/Behavioral: Negative for depression and suicidal ideas. The patient does not have insomnia.       Allergies  Allergen Reactions  . Codeine Nausea Only  . Disopyramide     Other reaction(s): Unknown  . Ibuprofen Diarrhea  . Iodine     blisters  . Metformin And Related   . Norpace [Disopyramide Phosphate]   . Quinidine     Other reaction(s): Unknown  . Terfenadine

## 2018-10-18 LAB — KAPPA/LAMBDA LIGHT CHAINS
Kappa free light chain: 27.3 mg/L — ABNORMAL HIGH (ref 3.3–19.4)
Kappa, lambda light chain ratio: 2.68 — ABNORMAL HIGH (ref 0.26–1.65)
Lambda free light chains: 10.2 mg/L (ref 5.7–26.3)

## 2018-10-18 LAB — HAPTOGLOBIN: Haptoglobin: 19 mg/dL — ABNORMAL LOW (ref 41–333)

## 2018-11-14 ENCOUNTER — Other Ambulatory Visit: Payer: Medicare Other

## 2018-12-19 ENCOUNTER — Ambulatory Visit: Payer: Medicare Other | Admitting: Oncology

## 2018-12-19 ENCOUNTER — Other Ambulatory Visit: Payer: Medicare Other

## 2018-12-22 ENCOUNTER — Encounter: Payer: Self-pay | Admitting: Oncology

## 2018-12-22 ENCOUNTER — Other Ambulatory Visit: Payer: Self-pay

## 2018-12-22 NOTE — Progress Notes (Signed)
Patient pre screened for office appointment, no questions or concerns today. Completed medication reconciliation with patient there were multiple medication on her chart that she stated she was not taking. I marked them "not taking" rather than removing them.She is going to bring in her current list tomorrow for review.

## 2018-12-23 ENCOUNTER — Other Ambulatory Visit: Payer: Self-pay

## 2018-12-23 ENCOUNTER — Encounter: Payer: Self-pay | Admitting: Oncology

## 2018-12-23 ENCOUNTER — Inpatient Hospital Stay (HOSPITAL_BASED_OUTPATIENT_CLINIC_OR_DEPARTMENT_OTHER): Payer: Medicare Other | Admitting: Oncology

## 2018-12-23 ENCOUNTER — Inpatient Hospital Stay: Payer: Medicare Other | Attending: Oncology

## 2018-12-23 VITALS — BP 154/82 | HR 68 | Temp 96.9°F | Resp 18 | Wt 103.3 lb

## 2018-12-23 DIAGNOSIS — I10 Essential (primary) hypertension: Secondary | ICD-10-CM | POA: Insufficient documentation

## 2018-12-23 DIAGNOSIS — I2510 Atherosclerotic heart disease of native coronary artery without angina pectoris: Secondary | ICD-10-CM | POA: Diagnosis not present

## 2018-12-23 DIAGNOSIS — D591 Autoimmune hemolytic anemia, unspecified: Secondary | ICD-10-CM

## 2018-12-23 DIAGNOSIS — M129 Arthropathy, unspecified: Secondary | ICD-10-CM | POA: Diagnosis not present

## 2018-12-23 DIAGNOSIS — I341 Nonrheumatic mitral (valve) prolapse: Secondary | ICD-10-CM | POA: Diagnosis not present

## 2018-12-23 DIAGNOSIS — Z7901 Long term (current) use of anticoagulants: Secondary | ICD-10-CM | POA: Diagnosis not present

## 2018-12-23 DIAGNOSIS — K219 Gastro-esophageal reflux disease without esophagitis: Secondary | ICD-10-CM | POA: Insufficient documentation

## 2018-12-23 DIAGNOSIS — Z8673 Personal history of transient ischemic attack (TIA), and cerebral infarction without residual deficits: Secondary | ICD-10-CM | POA: Insufficient documentation

## 2018-12-23 DIAGNOSIS — I509 Heart failure, unspecified: Secondary | ICD-10-CM | POA: Insufficient documentation

## 2018-12-23 DIAGNOSIS — Z9012 Acquired absence of left breast and nipple: Secondary | ICD-10-CM | POA: Insufficient documentation

## 2018-12-23 DIAGNOSIS — E785 Hyperlipidemia, unspecified: Secondary | ICD-10-CM | POA: Insufficient documentation

## 2018-12-23 DIAGNOSIS — I63512 Cerebral infarction due to unspecified occlusion or stenosis of left middle cerebral artery: Secondary | ICD-10-CM

## 2018-12-23 DIAGNOSIS — I4891 Unspecified atrial fibrillation: Secondary | ICD-10-CM | POA: Insufficient documentation

## 2018-12-23 DIAGNOSIS — Z853 Personal history of malignant neoplasm of breast: Secondary | ICD-10-CM | POA: Insufficient documentation

## 2018-12-23 DIAGNOSIS — Z7984 Long term (current) use of oral hypoglycemic drugs: Secondary | ICD-10-CM | POA: Diagnosis not present

## 2018-12-23 DIAGNOSIS — E119 Type 2 diabetes mellitus without complications: Secondary | ICD-10-CM | POA: Diagnosis not present

## 2018-12-23 DIAGNOSIS — Z79899 Other long term (current) drug therapy: Secondary | ICD-10-CM

## 2018-12-23 LAB — RETICULOCYTES
Immature Retic Fract: 6.4 % (ref 2.3–15.9)
RBC.: 4.07 MIL/uL (ref 3.87–5.11)
Retic Count, Absolute: 87.1 10*3/uL (ref 19.0–186.0)
Retic Ct Pct: 2.1 % (ref 0.4–3.1)

## 2018-12-23 LAB — CBC WITH DIFFERENTIAL/PLATELET
Abs Immature Granulocytes: 0.01 10*3/uL (ref 0.00–0.07)
Basophils Absolute: 0 10*3/uL (ref 0.0–0.1)
Basophils Relative: 0 %
Eosinophils Absolute: 0.1 10*3/uL (ref 0.0–0.5)
Eosinophils Relative: 1 %
HCT: 35.8 % — ABNORMAL LOW (ref 36.0–46.0)
Hemoglobin: 11.7 g/dL — ABNORMAL LOW (ref 12.0–15.0)
Immature Granulocytes: 0 %
Lymphocytes Relative: 23 %
Lymphs Abs: 1 10*3/uL (ref 0.7–4.0)
MCH: 28.7 pg (ref 26.0–34.0)
MCHC: 32.7 g/dL (ref 30.0–36.0)
MCV: 88 fL (ref 80.0–100.0)
Monocytes Absolute: 0.3 10*3/uL (ref 0.1–1.0)
Monocytes Relative: 8 %
Neutro Abs: 2.9 10*3/uL (ref 1.7–7.7)
Neutrophils Relative %: 68 %
Platelets: 275 10*3/uL (ref 150–400)
RBC: 4.07 MIL/uL (ref 3.87–5.11)
RDW: 16.5 % — ABNORMAL HIGH (ref 11.5–15.5)
WBC: 4.4 10*3/uL (ref 4.0–10.5)
nRBC: 0 % (ref 0.0–0.2)

## 2018-12-23 LAB — COMPREHENSIVE METABOLIC PANEL
ALT: 12 U/L (ref 0–44)
AST: 15 U/L (ref 15–41)
Albumin: 4.3 g/dL (ref 3.5–5.0)
Alkaline Phosphatase: 59 U/L (ref 38–126)
Anion gap: 7 (ref 5–15)
BUN: 16 mg/dL (ref 8–23)
CO2: 30 mmol/L (ref 22–32)
Calcium: 9.5 mg/dL (ref 8.9–10.3)
Chloride: 103 mmol/L (ref 98–111)
Creatinine, Ser: 0.83 mg/dL (ref 0.44–1.00)
GFR calc Af Amer: >60 mL/min (ref >60)
GFR calc non Af Amer: >60 mL/min (ref >60)
Glucose, Bld: 229 mg/dL — ABNORMAL HIGH (ref 70–99)
Potassium: 4.2 mmol/L (ref 3.5–5.1)
Sodium: 140 mmol/L (ref 135–145)
Total Bilirubin: 1.2 mg/dL (ref 0.3–1.2)
Total Protein: 6.4 g/dL — ABNORMAL LOW (ref 6.5–8.1)

## 2018-12-23 NOTE — Progress Notes (Signed)
Pt in for follow up, 42 birthday was yesterday.  Reports "some bulging areas in right abdomen" and had some epigastric pain a few times in the past 2 weeks.

## 2018-12-23 NOTE — Progress Notes (Signed)
Hematology/Oncology Consult note [facility]  Telephone:([phone] [phone]  Patient Care Team: Danella Penton, MD as PCP - General (Internal Medicine) Danella Penton, MD (Internal Medicine) Lemar Livings Merrily Pew, MD (General Surgery) Dalia Heading, MD as Consulting Physician (Cardiology)   Name of the patient: Elizabeth Novak  [id number]  04/03/1924   Date of visit: 12/23/18  Diagnosis- warm autoimmune hemolytic anemia  Chief complaint/ Reason for visit-routine follow-up of autoimmune hemolytic anemia  Heme/Onc history: Patient is a 85 year old female with a past medical history significant for hypertension, type 2 diabetes, CAD, hyperlipidemia, history of A. fib on Coumadin, sick sinus syndrome status post pacemaker placement and history of breast cancer status post left mastectomy in the past. She was admitted to [facility] on 04/18/2018 with symptoms of right-sided weakness and expressive a aphasia. She was treated for an acute left MCA ischemic stroke. She was started on apixaban 2.5 mg twice daily and aspirin/Coumadinwas discontinued. During that admission she was also found to have a hemoglobin of 7. Prior to that she had her last normal hemoglobin sometime in June 2019 when it was 13 and then gradually drifted down to 9 there wasalso some concern for GI bleed and she had seen [facility] in the past. Her hemoglobin did improve back up to 13 in August 2019 and remained stable between 12-13 until November 2019  During her [facility] hospitalization patient was diagnosed with warm autoimmune hemolytic anemia. Hematology was consulted and given her recent stroke, underlying diabetes-steroids was not offered as for first-line treatment and she was started on CellCept instead 500 mg twice daily she was supposed to follow-up with [facility] hematology as an outpatient but preferred to get care locally at [facility].CT chest abdomen and pelvis did  not reveal any evidence of malignancy.Multiple myeloma panel did not reveal any M protein. Serum free light chain ratio was abnormal at 2.18. She is anemic but her hemoglobin is stable around 8.6- 9.1and not requiring blood transfusion at this time.    Interval history-patient reports swelling in her right lower abdomen.  Appetite is stable and she has not had any unintentional weight loss.  ECOG PS- 1 Pain scale- 0   Review of systems- Review of Systems  Constitutional: Positive for malaise/fatigue. Negative for chills, fever and weight loss.  HENT: Negative for congestion, ear discharge and nosebleeds.   Eyes: Negative for blurred vision.  Respiratory: Negative for cough, hemoptysis, sputum production, shortness of breath and wheezing.   Cardiovascular: Negative for chest pain, palpitations, orthopnea and claudication.  Gastrointestinal: Negative for abdominal pain, blood in stool, constipation, diarrhea, heartburn, melena, nausea and vomiting.       Swelling in the right lower abdomen  Genitourinary: Negative for dysuria, flank pain, frequency, hematuria and urgency.  Musculoskeletal: Negative for back pain, joint pain and myalgias.  Skin: Negative for rash.  Neurological: Negative for dizziness, tingling, focal weakness, seizures, weakness and headaches.  Endo/Heme/Allergies: Does not bruise/bleed easily.  Psychiatric/Behavioral: Negative for depression and suicidal ideas. The patient does not have insomnia.        Allergies  Allergen Reactions   Codeine Nausea Only   Disopyramide     Other reaction(s): Unknown   Ibuprofen Diarrhea   Iodine     blisters   Metformin And Related    Norpace [Disopyramide Phosphate]    Quinidine     Other reaction(s): Unknown   Terfenadine     Other reaction(s): Unknown   Topiramate  Other reaction(s): Other (See Comments) Hair loss   Verapamil     Other reaction(s): Unknown     Past Medical History:  Diagnosis Date     A-fib (HCC)    Anemia    Arthritis    Breast cancer (HCC) 1978   left breast with lymph node removal   CHF (congestive heart failure) (HCC)    Diabetes mellitus without complication (HCC)    Diverticulitis    Dysrhythmia    GERD (gastroesophageal reflux disease)    Hyperlipidemia    Macular degeneration of both eyes    Mitral valve prolapse    Mitral valve regurgitation    Presence of permanent cardiac pacemaker      Past Surgical History:  Procedure Laterality Date   ABDOMINAL HYSTERECTOMY     APPENDECTOMY     BREAST SURGERY     CARDIAC CATHETERIZATION     ESOPHAGEAL DILATION     EYE SURGERY Bilateral    Cataract Extraction with IOL   INSERT / REPLACE / REMOVE PACEMAKER     IRRIGATION AND DEBRIDEMENT HEMATOMA Left 08/21/2015   Procedure: IRRIGATION AND DEBRIDEMENT HEMATOMA;  Surgeon: Earline Mayotte, MD;  Location: [facility] ORS;  Service: General;  Laterality: Left;   KNEE ARTHROSCOPY Right    LEFT OOPHORECTOMY Left    MASTECTOMY Left 1978   MASTECTOMY Right 1978   OPEN REDUCTION INTERNAL FIXATION (ORIF) DISTAL RADIAL FRACTURE Right 02/14/2018   Procedure: OPEN REDUCTION INTERNAL FIXATION (ORIF) DISTAL RADIAL FRACTURE;  Surgeon: Kennedy Bucker, MD;  Location: [facility] ORS;  Service: Orthopedics;  Laterality: Right;   PACEMAKER INSERTION  08/11/12   TEMPORAL ARTERY BIOPSY / LIGATION     TONSILLECTOMY      Social History   Socioeconomic History   Marital status: Widowed    Spouse name: Not on file   Number of children: 1   Years of education: 57   Highest education level: 11th grade  Occupational History   Not on file  Social Needs   Financial resource strain: Not hard at all   Food insecurity    Worry: Never true    Inability: Never true   Transportation needs    Medical: No    Non-medical: No  Tobacco Use   Smoking status: Never Smoker   Smokeless tobacco: Never Used  Substance and Sexual Activity   Alcohol use: No     Alcohol/week: 0.0 standard drinks   Drug use: No   Sexual activity: Not on file  Lifestyle   Physical activity    Days per week: 7 days    Minutes per session: 10 min   Stress: Not at all  Relationships   Social connections    Talks on phone: More than three times a week    Gets together: More than three times a week    Attends religious service: More than 4 times per year    Active member of club or organization: No    Attends meetings of clubs or organizations: Never    Relationship status: Widowed   Intimate partner violence    Fear of current or ex partner: No    Emotionally abused: No    Physically abused: No    Forced sexual activity: No  Other Topics Concern   Not on file  Social History Narrative   Widowed   1 son   Never smoker or smokeless tobacco user   No alcohol use   Full Code    Family History  Problem Relation Age of Onset   Hypertension Mother      Current Outpatient Medications:    apixaban (ELIQUIS) 2.5 MG TABS tablet, Take 1 tablet (2.5 mg total) by mouth 2 (two) times daily., Disp: 60 tablet, Rfl: 0   atorvastatin (LIPITOR) 20 MG tablet, Take 1 tablet (20 mg total) by mouth at bedtime., Disp: 30 tablet, Rfl: 0   carboxymethylcellulose (REFRESH PLUS) 0.5 % SOLN, 1 drop 3 (three) times daily as needed., Disp: , Rfl:    digoxin (LANOXIN) 0.125 MG tablet, Take 1 tablet (0.125 mg total) by mouth every other day. CHF- * Hold for HR < 60, Disp: 15 tablet, Rfl: 0   glimepiride (AMARYL) 2 MG tablet, Take 1 tablet (2 mg total) by mouth 2 (two) times daily., Disp: 60 tablet, Rfl: 0   metoprolol succinate (TOPROL-XL) 50 MG 24 hr tablet, Take 1 tablet (50 mg total) by mouth daily., Disp: 30 tablet, Rfl: 0   mirtazapine (REMERON) 7.5 MG tablet, Take 1 tablet (7.5 mg total) by mouth at bedtime., Disp: 30 tablet, Rfl: 0   Multiple Vitamins-Minerals (PRESERVISION AREDS 2 PO), Take 1 tablet by mouth 2 (two) times daily., Disp: , Rfl:    mycophenolate  (CELLCEPT) 500 MG tablet, Take 1 tablet (500 mg total) by mouth 2 (two) times daily., Disp: 60 tablet, Rfl: 0   polyethylene glycol (MIRALAX / GLYCOLAX) 17 g packet, Take 17 g by mouth daily as needed., Disp: , Rfl:    potassium chloride (K-DUR,KLOR-CON) 10 MEQ tablet, Take 4 tablets (40 mEq total) by mouth daily. (Patient taking differently: Take 50 mEq by mouth daily. ), Disp: 120 tablet, Rfl: 0   folic acid (FOLVITE) 1 MG tablet, Take 1 tablet (1 mg total) by mouth daily. (Patient not taking: Reported on 12/22/2018), Disp: 30 tablet, Rfl: 3   lidocaine (LIDODERM) 5 %, Place 1 patch onto the skin daily. Apply patch to painful area in the  and remove at bedtime (Patient not taking: Reported on 12/22/2018), Disp: 30 patch, Rfl: 0   metolazone (ZAROXOLYN) 2.5 MG tablet, Take 1 tablet (2.5 mg total) by mouth once a week. 1 tablet on Sunday (Patient not taking: Reported on 12/22/2018), Disp: 4 tablet, Rfl: 0   pantoprazole (PROTONIX) 40 MG tablet, Take 40 mg by mouth daily., Disp: , Rfl:   Physical exam:  Vitals:   12/23/18 1142  BP: (!) 154/82  Pulse: 68  Resp: 18  Temp: (!) 96.9 F (36.1 C)  TempSrc: Tympanic  Weight: 103 lb 4.8 oz (46.9 kg)   Physical Exam Constitutional:      Comments: Thin elderly frail woman in no acute distress  HENT:     Head: Normocephalic and atraumatic.  Eyes:     Pupils: Pupils are equal, round, and reactive to light.  Neck:     Musculoskeletal: Normal range of motion.  Cardiovascular:     Rate and Rhythm: Normal rate and regular rhythm.     Heart sounds: Normal heart sounds.  Pulmonary:     Effort: Pulmonary effort is normal.     Breath sounds: Normal breath sounds.  Abdominal:     General: Bowel sounds are normal.     Palpations: Abdomen is soft.     Comments: Right-sided inguinal hernia positive cough impulse positive  Musculoskeletal:     Comments: Bilateral +1 edema  Skin:    General: Skin is warm and dry.  Neurological:     Mental Status:  She is alert and oriented  to person, place, and time.      CMP Latest Ref Rng & Units 12/23/2018  Glucose 70 - 99 mg/dL 161(W)  BUN 8 - 23 mg/dL 16  Creatinine 9.60 - 4.54 mg/dL 0.98  Sodium 119 - 147 mmol/L 140  Potassium 3.5 - 5.1 mmol/L 4.2  Chloride 98 - 111 mmol/L 103  CO2 22 - 32 mmol/L 30  Calcium 8.9 - 10.3 mg/dL 9.5  Total Protein 6.5 - 8.1 g/dL 6.4(L)  Total Bilirubin 0.3 - 1.2 mg/dL 1.2  Alkaline Phos 38 - 126 U/L 59  AST 15 - 41 U/L 15  ALT 0 - 44 U/L 12   CBC Latest Ref Rng & Units 12/23/2018  WBC 4.0 - 10.5 K/uL 4.4  Hemoglobin 12.0 - 15.0 g/dL 11.7(L)  Hematocrit 36.0 - 46.0 % 35.8(L)  Platelets 150 - 400 K/uL 275      Assessment and plan- Patient is a 85 y.o. female with warm autoimmune hemolytic anemia currently on CellCept and this is a routine follow-up visit for her anemia  Patient's autoimmune hemolytic anemia is currently under good control with CellCept and her hemoglobin continues to remain stable in fact it is better today at 11.7.  Reticulocyte count is not high and haptoglobin remains detectable.  Total bilirubin is normal.  She will continue CellCept at this time.  Repeat labs in 2 months in 4 months and I will see her back in 4 months  Based on clinical exam patient seems to have right-sided inguinal hernia which is reducible and not causing any issues at this time.  I have asked the patient to speak with Dr. Hyacinth Meeker further about this   Visit Diagnosis 1. Autoimmune hemolytic anemia (HCC)   2. High risk medication use      Dr. Owens Shark, MD, MPH [facility] at [facility] [phone] 12/23/2018 3:24 PM

## 2018-12-24 LAB — HAPTOGLOBIN: Haptoglobin: 21 mg/dL — ABNORMAL LOW (ref 41–333)

## 2019-01-13 ENCOUNTER — Ambulatory Visit: Payer: Medicare Other | Admitting: Oncology

## 2019-01-13 ENCOUNTER — Other Ambulatory Visit: Payer: Medicare Other

## 2019-01-30 DIAGNOSIS — R911 Solitary pulmonary nodule: Secondary | ICD-10-CM | POA: Insufficient documentation

## 2019-02-22 ENCOUNTER — Inpatient Hospital Stay: Payer: Medicare Other

## 2019-04-21 ENCOUNTER — Other Ambulatory Visit: Payer: Self-pay

## 2019-04-21 NOTE — Progress Notes (Signed)
Patient pre screened for office appointment, no questions or concerns today. Patient reminded of upcoming appointment time and date. 

## 2019-04-24 ENCOUNTER — Other Ambulatory Visit: Payer: Self-pay

## 2019-04-24 ENCOUNTER — Inpatient Hospital Stay (HOSPITAL_BASED_OUTPATIENT_CLINIC_OR_DEPARTMENT_OTHER): Payer: Medicare Other | Admitting: Oncology

## 2019-04-24 ENCOUNTER — Inpatient Hospital Stay: Payer: Medicare Other | Attending: Oncology

## 2019-04-24 VITALS — BP 149/75 | HR 72 | Temp 97.4°F | Resp 16 | Wt 100.5 lb

## 2019-04-24 DIAGNOSIS — Z9012 Acquired absence of left breast and nipple: Secondary | ICD-10-CM | POA: Diagnosis not present

## 2019-04-24 DIAGNOSIS — E119 Type 2 diabetes mellitus without complications: Secondary | ICD-10-CM | POA: Insufficient documentation

## 2019-04-24 DIAGNOSIS — Z950 Presence of cardiac pacemaker: Secondary | ICD-10-CM | POA: Insufficient documentation

## 2019-04-24 DIAGNOSIS — Z803 Family history of malignant neoplasm of breast: Secondary | ICD-10-CM | POA: Insufficient documentation

## 2019-04-24 DIAGNOSIS — I4891 Unspecified atrial fibrillation: Secondary | ICD-10-CM | POA: Diagnosis not present

## 2019-04-24 DIAGNOSIS — Z79899 Other long term (current) drug therapy: Secondary | ICD-10-CM | POA: Diagnosis not present

## 2019-04-24 DIAGNOSIS — Z7901 Long term (current) use of anticoagulants: Secondary | ICD-10-CM | POA: Insufficient documentation

## 2019-04-24 DIAGNOSIS — D591 Autoimmune hemolytic anemia, unspecified: Secondary | ICD-10-CM

## 2019-04-24 DIAGNOSIS — I341 Nonrheumatic mitral (valve) prolapse: Secondary | ICD-10-CM | POA: Insufficient documentation

## 2019-04-24 DIAGNOSIS — Z8673 Personal history of transient ischemic attack (TIA), and cerebral infarction without residual deficits: Secondary | ICD-10-CM | POA: Diagnosis not present

## 2019-04-24 DIAGNOSIS — E785 Hyperlipidemia, unspecified: Secondary | ICD-10-CM | POA: Diagnosis not present

## 2019-04-24 DIAGNOSIS — I2510 Atherosclerotic heart disease of native coronary artery without angina pectoris: Secondary | ICD-10-CM | POA: Diagnosis not present

## 2019-04-24 DIAGNOSIS — I110 Hypertensive heart disease with heart failure: Secondary | ICD-10-CM | POA: Diagnosis not present

## 2019-04-24 DIAGNOSIS — K219 Gastro-esophageal reflux disease without esophagitis: Secondary | ICD-10-CM | POA: Insufficient documentation

## 2019-04-24 DIAGNOSIS — I509 Heart failure, unspecified: Secondary | ICD-10-CM | POA: Insufficient documentation

## 2019-04-24 DIAGNOSIS — D5911 Warm autoimmune hemolytic anemia: Secondary | ICD-10-CM | POA: Diagnosis not present

## 2019-04-24 LAB — RETICULOCYTES
Immature Retic Fract: 7.8 % (ref 2.3–15.9)
RBC.: 4.42 MIL/uL (ref 3.87–5.11)
Retic Count, Absolute: 48.6 10*3/uL (ref 19.0–186.0)
Retic Ct Pct: 1.1 % (ref 0.4–3.1)

## 2019-04-24 LAB — COMPREHENSIVE METABOLIC PANEL
ALT: 14 U/L (ref 0–44)
AST: 16 U/L (ref 15–41)
Albumin: 3.9 g/dL (ref 3.5–5.0)
Alkaline Phosphatase: 64 U/L (ref 38–126)
Anion gap: 9 (ref 5–15)
BUN: 16 mg/dL (ref 8–23)
CO2: 29 mmol/L (ref 22–32)
Calcium: 9.3 mg/dL (ref 8.9–10.3)
Chloride: 102 mmol/L (ref 98–111)
Creatinine, Ser: 0.66 mg/dL (ref 0.44–1.00)
GFR calc Af Amer: >60 mL/min (ref >60)
GFR calc non Af Amer: >60 mL/min (ref >60)
Glucose, Bld: 219 mg/dL — ABNORMAL HIGH (ref 70–99)
Potassium: 3.4 mmol/L — ABNORMAL LOW (ref 3.5–5.1)
Sodium: 140 mmol/L (ref 135–145)
Total Bilirubin: 0.9 mg/dL (ref 0.3–1.2)
Total Protein: 6.3 g/dL — ABNORMAL LOW (ref 6.5–8.1)

## 2019-04-24 LAB — CBC WITH DIFFERENTIAL/PLATELET
Abs Immature Granulocytes: 0.01 10*3/uL (ref 0.00–0.07)
Basophils Absolute: 0 10*3/uL (ref 0.0–0.1)
Basophils Relative: 1 %
Eosinophils Absolute: 0.1 10*3/uL (ref 0.0–0.5)
Eosinophils Relative: 1 %
HCT: 38.7 % (ref 36.0–46.0)
Hemoglobin: 12.1 g/dL (ref 12.0–15.0)
Immature Granulocytes: 0 %
Lymphocytes Relative: 21 %
Lymphs Abs: 0.9 10*3/uL (ref 0.7–4.0)
MCH: 27.8 pg (ref 26.0–34.0)
MCHC: 31.3 g/dL (ref 30.0–36.0)
MCV: 88.8 fL (ref 80.0–100.0)
Monocytes Absolute: 0.4 10*3/uL (ref 0.1–1.0)
Monocytes Relative: 9 %
Neutro Abs: 2.9 10*3/uL (ref 1.7–7.7)
Neutrophils Relative %: 68 %
Platelets: 219 10*3/uL (ref 150–400)
RBC: 4.36 MIL/uL (ref 3.87–5.11)
RDW: 13.8 % (ref 11.5–15.5)
WBC: 4.3 10*3/uL (ref 4.0–10.5)
nRBC: 0 % (ref 0.0–0.2)

## 2019-04-24 NOTE — Progress Notes (Signed)
Pt has a sore spot on her head and she did not bump it or hit it to her knowledge. There is no red spot or irritation in the area that I can see.

## 2019-04-25 ENCOUNTER — Encounter: Payer: Self-pay | Admitting: Oncology

## 2019-04-25 LAB — HAPTOGLOBIN: Haptoglobin: 98 mg/dL (ref 41–333)

## 2019-04-25 NOTE — Progress Notes (Signed)
Hematology/Oncology Consult note [facility]  Telephone:([phone] [phone]  Patient Care Team: Danella Penton, MD as PCP - General (Internal Medicine) Danella Penton, MD (Internal Medicine) Lemar Livings Merrily Pew, MD (General Surgery) Dalia Heading, MD as Consulting Physician (Cardiology)   Name of the patient: Elizabeth Novak  [id number]  01/13/1924   Date of visit: 04/25/19  Diagnosis- warm autoimmune hemolytic anemia  Chief complaint/ Reason for visit-routine follow-up of autoimmune hemolytic anemia on CellCept  Heme/Onc history: Patient is a 86 year old female with a past medical history significant for hypertension, type 2 diabetes, CAD, hyperlipidemia, history of A. fib on Coumadin, sick sinus syndrome status post pacemaker placement and history of breast cancer status post left mastectomy in the past. She was admitted to [facility] on 04/18/2018 with symptoms of right-sided weakness and expressive a aphasia. She was treated for an acute left MCA ischemic stroke. She was started on apixaban 2.5 mg twice daily and aspirin/Coumadinwas discontinued. During that admission she was also found to have a hemoglobin of 7. Prior to that she had her last normal hemoglobin sometime in June 2019 when it was 13 and then gradually drifted down to 9 there wasalso some concern for GI bleed and she had seen [facility] in the past. Her hemoglobin did improve back up to 13 in August 2019 and remained stable between 12-13 until November 2019  During her [facility] hospitalization patient was diagnosed with warm autoimmune hemolytic anemia. Hematology was consulted and given her recent stroke, underlying diabetes-steroids was not offered as for first-line treatment and she was started on CellCept instead 500 mg twice daily she was supposed to follow-up with [facility] hematology as an outpatient but preferred to get care locally at [facility].CT chest abdomen and  pelvis did not reveal any evidence of malignancy.Multiple myeloma panel did not reveal any M protein. Serum free light chain ratio was abnormal at 2.18. She is anemic but her hemoglobin is stable around 8.6- 9.1and not requiring blood transfusion at this time.   Interval history-patient is doing well and continues to live at her house independently.  She does have a 24-hour caregiver  ECOG PS- 2 Pain scale- 0 Opioid associated constipation- no  Review of systems- Review of Systems  Constitutional: Positive for malaise/fatigue. Negative for chills, fever and weight loss.  HENT: Negative for congestion, ear discharge and nosebleeds.   Eyes: Negative for blurred vision.  Respiratory: Negative for cough, hemoptysis, sputum production, shortness of breath and wheezing.   Cardiovascular: Negative for chest pain, palpitations, orthopnea and claudication.  Gastrointestinal: Negative for abdominal pain, blood in stool, constipation, diarrhea, heartburn, melena, nausea and vomiting.  Genitourinary: Negative for dysuria, flank pain, frequency, hematuria and urgency.  Musculoskeletal: Negative for back pain, joint pain and myalgias.  Skin: Negative for rash.  Neurological: Negative for dizziness, tingling, focal weakness, seizures, weakness and headaches.  Endo/Heme/Allergies: Does not bruise/bleed easily.  Psychiatric/Behavioral: Negative for depression and suicidal ideas. The patient does not have insomnia.       Allergies  Allergen Reactions  . Codeine Nausea Only  . Disopyramide     Other reaction(s): Unknown  . Ibuprofen Diarrhea  . Iodine     blisters  . Metformin And Related   . Norpace [Disopyramide Phosphate]   . Quinidine     Other reaction(s): Unknown  . Terfenadine     Other reaction(s): Unknown  . Topiramate     Other reaction(s): Other (See Comments) Hair loss  .  Verapamil     Other reaction(s): Unknown     Past Medical History:  Diagnosis Date  . A-fib (HCC)   .  Anemia   . Arthritis   . Breast cancer (HCC) 1978   left breast with lymph node removal  . CHF (congestive heart failure) (HCC)   . Diabetes mellitus without complication (HCC)   . Diverticulitis   . Dysrhythmia   . GERD (gastroesophageal reflux disease)   . Hyperlipidemia   . Macular degeneration of both eyes   . Mitral valve prolapse   . Mitral valve regurgitation   . Presence of permanent cardiac pacemaker      Past Surgical History:  Procedure Laterality Date  . ABDOMINAL HYSTERECTOMY    . APPENDECTOMY    . BREAST SURGERY    . CARDIAC CATHETERIZATION    . ESOPHAGEAL DILATION    . EYE SURGERY Bilateral    Cataract Extraction with IOL  . INSERT / REPLACE / REMOVE PACEMAKER    . IRRIGATION AND DEBRIDEMENT HEMATOMA Left 08/21/2015   Procedure: IRRIGATION AND DEBRIDEMENT HEMATOMA;  Surgeon: Earline Mayotte, MD;  Location: [facility] ORS;  Service: General;  Laterality: Left;  . KNEE ARTHROSCOPY Right   . LEFT OOPHORECTOMY Left   . MASTECTOMY Left 1978  . MASTECTOMY Right 1978  . OPEN REDUCTION INTERNAL FIXATION (ORIF) DISTAL RADIAL FRACTURE Right 02/14/2018   Procedure: OPEN REDUCTION INTERNAL FIXATION (ORIF) DISTAL RADIAL FRACTURE;  Surgeon: Kennedy Bucker, MD;  Location: [facility] ORS;  Service: Orthopedics;  Laterality: Right;  . PACEMAKER INSERTION  08/11/12  . TEMPORAL ARTERY BIOPSY / LIGATION    . TONSILLECTOMY      Social History   Socioeconomic History  . Marital status: Widowed    Spouse name: Not on file  . Number of children: 1  . Years of education: 44  . Highest education level: 11th grade  Occupational History  . Not on file  Tobacco Use  . Smoking status: Never Smoker  . Smokeless tobacco: Never Used  Substance and Sexual Activity  . Alcohol use: No    Alcohol/week: 0.0 standard drinks  . Drug use: No  . Sexual activity: Not on file  Other Topics Concern  . Not on file  Social History Narrative   Widowed   1 son   Never smoker or smokeless tobacco  user   No alcohol use   Full Code   Social Determinants of Health   Financial Resource Strain:   . Difficulty of Paying Living Expenses: Not on file  Food Insecurity:   . Worried About Programme researcher, broadcasting/film/video in the Last Year: Not on file  . Ran Out of Food in the Last Year: Not on file  Transportation Needs:   . Lack of Transportation (Medical): Not on file  . Lack of Transportation (Non-Medical): Not on file  Physical Activity:   . Days of Exercise per Week: Not on file  . Minutes of Exercise per Session: Not on file  Stress:   . Feeling of Stress : Not on file  Social Connections:   . Frequency of Communication with Friends and Family: Not on file  . Frequency of Social Gatherings with Friends and Family: Not on file  . Attends Religious Services: Not on file  . Active Member of Clubs or Organizations: Not on file  . Attends Banker Meetings: Not on file  . Marital Status: Not on file  Intimate Partner Violence:   . Fear of Current  or Ex-Partner: Not on file  . Emotionally Abused: Not on file  . Physically Abused: Not on file  . Sexually Abused: Not on file    Family History  Problem Relation Age of Onset  . Hypertension Mother      Current Outpatient Medications:  .  apixaban (ELIQUIS) 2.5 MG TABS tablet, Take 1 tablet (2.5 mg total) by mouth 2 (two) times daily., Disp: 60 tablet, Rfl: 0 .  atorvastatin (LIPITOR) 20 MG tablet, Take 1 tablet (20 mg total) by mouth at bedtime., Disp: 30 tablet, Rfl: 0 .  carboxymethylcellulose (REFRESH PLUS) 0.5 % SOLN, 1 drop 3 (three) times daily as needed., Disp: , Rfl:  .  digoxin (LANOXIN) 0.125 MG tablet, Take 1 tablet (0.125 mg total) by mouth every other day. CHF- * Hold for HR < 60, Disp: 15 tablet, Rfl: 0 .  folic acid (FOLVITE) 1 MG tablet, Take 1 tablet (1 mg total) by mouth daily., Disp: 30 tablet, Rfl: 3 .  glimepiride (AMARYL) 2 MG tablet, Take 1 tablet (2 mg total) by mouth 2 (two) times daily., Disp: 60 tablet,  Rfl: 0 .  lidocaine (LIDODERM) 5 %, Place 1 patch onto the skin daily. Apply patch to painful area in the  and remove at bedtime, Disp: 30 patch, Rfl: 0 .  metolazone (ZAROXOLYN) 2.5 MG tablet, Take 1 tablet (2.5 mg total) by mouth once a week. 1 tablet on Sunday, Disp: 4 tablet, Rfl: 0 .  metoprolol succinate (TOPROL-XL) 50 MG 24 hr tablet, Take 1 tablet (50 mg total) by mouth daily., Disp: 30 tablet, Rfl: 0 .  mirtazapine (REMERON) 7.5 MG tablet, Take 1 tablet (7.5 mg total) by mouth at bedtime., Disp: 30 tablet, Rfl: 0 .  Multiple Vitamins-Minerals (PRESERVISION AREDS 2 PO), Take 1 tablet by mouth 2 (two) times daily., Disp: , Rfl:  .  mycophenolate (CELLCEPT) 500 MG tablet, Take 1 tablet (500 mg total) by mouth 2 (two) times daily., Disp: 60 tablet, Rfl: 0 .  pantoprazole (PROTONIX) 40 MG tablet, Take 40 mg by mouth daily., Disp: , Rfl:  .  polyethylene glycol (MIRALAX / GLYCOLAX) 17 g packet, Take 17 g by mouth daily as needed., Disp: , Rfl:  .  potassium chloride (K-DUR,KLOR-CON) 10 MEQ tablet, Take 4 tablets (40 mEq total) by mouth daily. (Patient taking differently: Take 50 mEq by mouth daily. ), Disp: 120 tablet, Rfl: 0  Physical exam:  Vitals:   04/24/19 1121  BP: (!) 149/75  Pulse: 72  Resp: 16  Temp: (!) 97.4 F (36.3 C)  TempSrc: Tympanic  Weight: 100 lb 8 oz (45.6 kg)   Physical Exam Constitutional:      Comments: Thin frail elderly lady in a wheelchair. Appears in no acute distress  HENT:     Head: Normocephalic and atraumatic.  Eyes:     Pupils: Pupils are equal, round, and reactive to light.  Cardiovascular:     Rate and Rhythm: Normal rate and regular rhythm.     Heart sounds: Normal heart sounds.  Pulmonary:     Effort: Pulmonary effort is normal.     Breath sounds: Normal breath sounds.  Abdominal:     General: Bowel sounds are normal.     Palpations: Abdomen is soft.  Musculoskeletal:     Cervical back: Normal range of motion.     Comments: B/l +1 edema    Skin:    General: Skin is warm and dry.  Neurological:     Mental  Status: She is alert and oriented to person, place, and time.      CMP Latest Ref Rng & Units 04/24/2019  Glucose 70 - 99 mg/dL 696(E)  BUN 8 - 23 mg/dL 16  Creatinine 9.52 - 8.41 mg/dL 3.24  Sodium 401 - 027 mmol/L 140  Potassium 3.5 - 5.1 mmol/L 3.4(L)  Chloride 98 - 111 mmol/L 102  CO2 22 - 32 mmol/L 29  Calcium 8.9 - 10.3 mg/dL 9.3  Total Protein 6.5 - 8.1 g/dL 6.3(L)  Total Bilirubin 0.3 - 1.2 mg/dL 0.9  Alkaline Phos 38 - 126 U/L 64  AST 15 - 41 U/L 16  ALT 0 - 44 U/L 14   CBC Latest Ref Rng & Units 04/24/2019  WBC 4.0 - 10.5 K/uL 4.3  Hemoglobin 12.0 - 15.0 g/dL 25.3  Hematocrit 66.4 - 46.0 % 38.7  Platelets 150 - 400 K/uL 219     Assessment and plan- Patient is a 86 y.o. female  with warm autoimmune hemolytic anemia currently on CellCept.  This is a routine follow-up visit for anemia  I have personally reviewed the labs today as well as noted the trends of her hemoglobin over the last 1 year.  Patient's hemoglobin has gradually improved from 8-12.1 over the last 1 year.  Her hemolysis labs also reveal a normal haptoglobin count and a normal bilirubin indicating slow resolution of her hemolysis.  Patient's hemoglobin in January 2020 was 8.3 and today it is 12.1.  I will keep her on CellCept for now but if she continues to show no significant hemolysis over the next 3 to 6 months I will slowly bring her off CellCept.  I will see her back in 3 months with CBC with differential, CMP, reticulocyte count and haptoglobin followed by video visit a few days later.     Visit Diagnosis 1. Autoimmune hemolytic anemia   2. High risk medication use      Dr. Owens Shark, MD, MPH [facility] at [facility] [phone] 04/25/2019 1:00 PM

## 2019-07-01 ENCOUNTER — Emergency Department
Admission: EM | Admit: 2019-07-01 | Discharge: 2019-07-02 | Disposition: A | Discharge status: Home / Self Care | Payer: Medicare Other | Attending: Emergency Medicine | Admitting: Emergency Medicine

## 2019-07-01 ENCOUNTER — Other Ambulatory Visit: Payer: Self-pay

## 2019-07-01 DIAGNOSIS — I495 Sick sinus syndrome: Secondary | ICD-10-CM | POA: Insufficient documentation

## 2019-07-01 DIAGNOSIS — I110 Hypertensive heart disease with heart failure: Secondary | ICD-10-CM | POA: Insufficient documentation

## 2019-07-01 DIAGNOSIS — E119 Type 2 diabetes mellitus without complications: Secondary | ICD-10-CM | POA: Diagnosis not present

## 2019-07-01 DIAGNOSIS — Z7901 Long term (current) use of anticoagulants: Secondary | ICD-10-CM | POA: Diagnosis not present

## 2019-07-01 DIAGNOSIS — R0789 Other chest pain: Secondary | ICD-10-CM | POA: Diagnosis present

## 2019-07-01 DIAGNOSIS — Z7984 Long term (current) use of oral hypoglycemic drugs: Secondary | ICD-10-CM | POA: Insufficient documentation

## 2019-07-01 DIAGNOSIS — Z79899 Other long term (current) drug therapy: Secondary | ICD-10-CM | POA: Diagnosis not present

## 2019-07-01 DIAGNOSIS — I2510 Atherosclerotic heart disease of native coronary artery without angina pectoris: Secondary | ICD-10-CM | POA: Diagnosis not present

## 2019-07-01 DIAGNOSIS — Z950 Presence of cardiac pacemaker: Secondary | ICD-10-CM | POA: Insufficient documentation

## 2019-07-01 DIAGNOSIS — I5033 Acute on chronic diastolic (congestive) heart failure: Secondary | ICD-10-CM | POA: Insufficient documentation

## 2019-07-01 DIAGNOSIS — R079 Chest pain, unspecified: Secondary | ICD-10-CM

## 2019-07-01 NOTE — ED Triage Notes (Signed)
Pt arrives to ED from home via [facility] with c/c of chest pain starting at 10pm. Pt has Hx of 1 MI and 2 strokes, Type 2 DBM. EMS reports transport vitals of 157/78, p69, ST depression noted, O2 100% on room air, CBG 365. EMS gave 324 baby ASA, an pt took 1 nitro sub lingual. Upon arrival, pt A&Ox4, NAD, no respiratory Sx evident

## 2019-07-02 ENCOUNTER — Emergency Department: Payer: Medicare Other

## 2019-07-02 DIAGNOSIS — R0789 Other chest pain: Secondary | ICD-10-CM | POA: Diagnosis not present

## 2019-07-02 LAB — BASIC METABOLIC PANEL
Anion gap: 8 (ref 5–15)
BUN: 17 mg/dL (ref 8–23)
CO2: 30 mmol/L (ref 22–32)
Calcium: 9 mg/dL (ref 8.9–10.3)
Chloride: 103 mmol/L (ref 98–111)
Creatinine, Ser: 0.78 mg/dL (ref 0.44–1.00)
GFR calc Af Amer: >60 mL/min (ref >60)
GFR calc non Af Amer: >60 mL/min (ref >60)
Glucose, Bld: 249 mg/dL — ABNORMAL HIGH (ref 70–99)
Potassium: 3.6 mmol/L (ref 3.5–5.1)
Sodium: 141 mmol/L (ref 135–145)

## 2019-07-02 LAB — CBC WITH DIFFERENTIAL/PLATELET
Abs Immature Granulocytes: 0.01 10*3/uL (ref 0.00–0.07)
Basophils Absolute: 0 10*3/uL (ref 0.0–0.1)
Basophils Relative: 0 %
Eosinophils Absolute: 0 10*3/uL (ref 0.0–0.5)
Eosinophils Relative: 0 %
HCT: 36.2 % (ref 36.0–46.0)
Hemoglobin: 11.6 g/dL — ABNORMAL LOW (ref 12.0–15.0)
Immature Granulocytes: 0 %
Lymphocytes Relative: 13 %
Lymphs Abs: 0.7 10*3/uL (ref 0.7–4.0)
MCH: 28 pg (ref 26.0–34.0)
MCHC: 32 g/dL (ref 30.0–36.0)
MCV: 87.4 fL (ref 80.0–100.0)
Monocytes Absolute: 0.4 10*3/uL (ref 0.1–1.0)
Monocytes Relative: 8 %
Neutro Abs: 4 10*3/uL (ref 1.7–7.7)
Neutrophils Relative %: 79 %
Platelets: 239 10*3/uL (ref 150–400)
RBC: 4.14 MIL/uL (ref 3.87–5.11)
RDW: 13.6 % (ref 11.5–15.5)
WBC: 5.1 10*3/uL (ref 4.0–10.5)
nRBC: 0 % (ref 0.0–0.2)

## 2019-07-02 LAB — TROPONIN I (HIGH SENSITIVITY)
Troponin I (High Sensitivity): 7 ng/L (ref <18)
Troponin I (High Sensitivity): 8 ng/L (ref <18)

## 2019-07-02 LAB — DIGOXIN LEVEL: Digoxin Level: 0.6 ng/mL — ABNORMAL LOW (ref 0.8–2.0)

## 2019-07-02 LAB — PROTIME-INR
INR: 1.1 (ref 0.8–1.2)
Prothrombin Time: 14.4 seconds (ref 11.4–15.2)

## 2019-07-02 LAB — BRAIN NATRIURETIC PEPTIDE: B Natriuretic Peptide: 246 pg/mL — ABNORMAL HIGH (ref 0.0–100.0)

## 2019-07-02 MED ORDER — GLIMEPIRIDE 2 MG PO TABS
2.0000 mg | ORAL_TABLET | Freq: Once | ORAL | Status: DC
  Filled 2019-07-02: qty 1

## 2019-07-02 MED ORDER — APIXABAN 2.5 MG PO TABS
2.5000 mg | ORAL_TABLET | Freq: Once | ORAL | Status: AC
  Administered 2019-07-02: 02:00:00 2.5 mg via ORAL
  Filled 2019-07-02: qty 1

## 2019-07-02 MED ORDER — POTASSIUM CHLORIDE CRYS ER 20 MEQ PO TBCR
10.0000 meq | EXTENDED_RELEASE_TABLET | Freq: Once | ORAL | Status: AC
  Administered 2019-07-02: 10 meq via ORAL
  Filled 2019-07-02: qty 1

## 2019-07-02 MED ORDER — ISOSORBIDE MONONITRATE ER 60 MG PO TB24
30.0000 mg | ORAL_TABLET | Freq: Once | ORAL | Status: AC
  Administered 2019-07-02: 30 mg via ORAL
  Filled 2019-07-02: qty 1

## 2019-07-02 MED ORDER — TORSEMIDE 20 MG PO TABS
20.0000 mg | ORAL_TABLET | Freq: Once | ORAL | Status: AC
  Administered 2019-07-02: 20 mg via ORAL
  Filled 2019-07-02: qty 1

## 2019-07-02 NOTE — ED Notes (Signed)
E signature pad not working. Pt educated on discharge instructions and verbalized understanding.  
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
## 2019-07-02 NOTE — Discharge Instructions (Addendum)

## 2019-07-24 ENCOUNTER — Inpatient Hospital Stay: Payer: Medicare Other | Attending: Oncology

## 2019-07-24 ENCOUNTER — Other Ambulatory Visit: Payer: Self-pay

## 2019-07-24 DIAGNOSIS — D591 Autoimmune hemolytic anemia, unspecified: Secondary | ICD-10-CM | POA: Insufficient documentation

## 2019-07-24 LAB — COMPREHENSIVE METABOLIC PANEL
ALT: 16 U/L (ref 0–44)
AST: 16 U/L (ref 15–41)
Albumin: 3.7 g/dL (ref 3.5–5.0)
Alkaline Phosphatase: 80 U/L (ref 38–126)
Anion gap: 11 (ref 5–15)
BUN: 16 mg/dL (ref 8–23)
CO2: 30 mmol/L (ref 22–32)
Calcium: 9.1 mg/dL (ref 8.9–10.3)
Chloride: 101 mmol/L (ref 98–111)
Creatinine, Ser: 0.82 mg/dL (ref 0.44–1.00)
GFR calc Af Amer: >60 mL/min (ref >60)
GFR calc non Af Amer: >60 mL/min (ref >60)
Glucose, Bld: 290 mg/dL — ABNORMAL HIGH (ref 70–99)
Potassium: 3.6 mmol/L (ref 3.5–5.1)
Sodium: 142 mmol/L (ref 135–145)
Total Bilirubin: 0.8 mg/dL (ref 0.3–1.2)
Total Protein: 6.2 g/dL — ABNORMAL LOW (ref 6.5–8.1)

## 2019-07-24 LAB — RETIC PANEL
Immature Retic Fract: 5.3 % (ref 2.3–15.9)
RBC.: 4.37 MIL/uL (ref 3.87–5.11)
Retic Count, Absolute: 48.5 10*3/uL (ref 19.0–186.0)
Retic Ct Pct: 1.1 % (ref 0.4–3.1)
Reticulocyte Hemoglobin: 30.3 pg (ref >27.9)

## 2019-07-24 LAB — CBC WITH DIFFERENTIAL/PLATELET
Abs Immature Granulocytes: 0.01 10*3/uL (ref 0.00–0.07)
Basophils Absolute: 0 10*3/uL (ref 0.0–0.1)
Basophils Relative: 1 %
Eosinophils Absolute: 0 10*3/uL (ref 0.0–0.5)
Eosinophils Relative: 1 %
HCT: 37.3 % (ref 36.0–46.0)
Hemoglobin: 12 g/dL (ref 12.0–15.0)
Immature Granulocytes: 0 %
Lymphocytes Relative: 15 %
Lymphs Abs: 0.7 10*3/uL (ref 0.7–4.0)
MCH: 28.1 pg (ref 26.0–34.0)
MCHC: 32.2 g/dL (ref 30.0–36.0)
MCV: 87.4 fL (ref 80.0–100.0)
Monocytes Absolute: 0.4 10*3/uL (ref 0.1–1.0)
Monocytes Relative: 9 %
Neutro Abs: 3.4 10*3/uL (ref 1.7–7.7)
Neutrophils Relative %: 74 %
Platelets: 214 10*3/uL (ref 150–400)
RBC: 4.27 MIL/uL (ref 3.87–5.11)
RDW: 13.9 % (ref 11.5–15.5)
WBC: 4.6 10*3/uL (ref 4.0–10.5)
nRBC: 0 % (ref 0.0–0.2)

## 2019-07-24 LAB — RETICULOCYTES
Immature Retic Fract: 6.1 % (ref 2.3–15.9)
RBC.: 4.45 MIL/uL (ref 3.87–5.11)
Retic Count, Absolute: 53.4 10*3/uL (ref 19.0–186.0)
Retic Ct Pct: 1.2 % (ref 0.4–3.1)

## 2019-07-25 ENCOUNTER — Encounter: Payer: Self-pay | Admitting: Oncology

## 2019-07-25 ENCOUNTER — Inpatient Hospital Stay (HOSPITAL_BASED_OUTPATIENT_CLINIC_OR_DEPARTMENT_OTHER): Payer: Medicare Other | Admitting: Oncology

## 2019-07-25 DIAGNOSIS — D591 Autoimmune hemolytic anemia, unspecified: Secondary | ICD-10-CM

## 2019-07-25 DIAGNOSIS — Z79899 Other long term (current) drug therapy: Secondary | ICD-10-CM

## 2019-07-25 LAB — HAPTOGLOBIN: Haptoglobin: 103 mg/dL (ref 41–333)

## 2019-07-25 NOTE — Progress Notes (Signed)
Patient stated that she had been doing perfectly fine.

## 2019-07-28 NOTE — Progress Notes (Signed)
I connected with [facility] on 07/28/19 at 11:30 AM EDT by telephone visit and verified that I am speaking with the correct person using two identifiers.   I discussed the limitations, risks, security and privacy concerns of performing an evaluation and management service by telemedicine and the availability of in-person appointments. I also discussed with the patient that there may be a patient responsible charge related to this service. The patient expressed understanding and agreed to proceed.  Other persons participating in the visit and their role in the encounter:  none  Patient's location:  home Provider's location:  work  Risk analyst Complaint: Routine follow-up of autoimmune hemolytic anemia  History of present illness: Patient is a 86 year old female with a past medical history significant for hypertension, type 2 diabetes, CAD, hyperlipidemia, history of A. fib on Coumadin, sick sinus syndrome status post pacemaker placement and history of breast cancer status post left mastectomy in the past. She was admitted to [facility] on 04/18/2018 with symptoms of right-sided weakness and expressive a aphasia. She was treated for an acute left MCA ischemic stroke. She was started on apixaban 2.5 mg twice daily and aspirin/Coumadinwas discontinued. During that admission she was also found to have a hemoglobin of 7. Prior to that she had her last normal hemoglobin sometime in June 2019 when it was 13 and then gradually drifted down to 9 there wasalso some concern for [facility] bleed and she had seen [facility] in the past. Her hemoglobin did improve back up to 13 in August 2019 and remained stable between 12-13 until November 2019  During her [facility] patient was diagnosed with warm autoimmune hemolytic anemia. Hematology was consulted and given her recent stroke, underlying diabetes-steroids was not offered as for first-line treatment and she was started on CellCept instead 500 mg  twice daily she was supposed to follow-up with [facility] as an outpatient but preferred to get care locally at [facility] chest abdomen and pelvis did not reveal any evidence of malignancy.Multiple myeloma panel did not reveal any M protein. Serum free light chain ratio was abnormal at 2.18. Patient has responded today to mycophenolate and her hemoglobin has been between 11-12 since September 2020  Interval history: Patient reports she is doing well for her age.  She has received her Covid vaccination.  She has chronic fatigue but denies other new complaints at this time   Review of Systems  Constitutional: Positive for malaise/fatigue. Negative for chills, fever and weight loss.  HENT: Negative for congestion, ear discharge and nosebleeds.   Eyes: Negative for blurred vision.  Respiratory: Negative for cough, hemoptysis, sputum production, shortness of breath and wheezing.   Cardiovascular: Negative for chest pain, palpitations, orthopnea and claudication.  Gastrointestinal: Negative for abdominal pain, blood in stool, constipation, diarrhea, heartburn, melena, nausea and vomiting.  Genitourinary: Negative for dysuria, flank pain, frequency, hematuria and urgency.  Musculoskeletal: Negative for back pain, joint pain and myalgias.  Skin: Negative for rash.  Neurological: Negative for dizziness, tingling, focal weakness, seizures, weakness and headaches.  Endo/Heme/Allergies: Does not bruise/bleed easily.  Psychiatric/Behavioral: Negative for depression and suicidal ideas. The patient does not have insomnia.     Allergies  Allergen Reactions  . Codeine Nausea Only  . Disopyramide     Other reaction(s): Unknown  . Ibuprofen Diarrhea  . Iodine     blisters  . Metformin And Related   . Norpace [Disopyramide Phosphate]   . Quinidine     Other reaction(s): Unknown  . Terfenadine  Other reaction(s): Unknown  . Topiramate     Other reaction(s): Other (See Comments) Hair  loss  . Verapamil     Other reaction(s): Unknown  . Dexamethasone Sodium Phosphate Palpitations    Past Medical History:  Diagnosis Date  . A-fib ([place])   . Anemia   . Arthritis   . Breast cancer ([place]) 1978   left breast with lymph node removal  . CHF (congestive heart failure) ([place])   . Diabetes mellitus without complication ([place])   . Diverticulitis   . Dysrhythmia   . GERD (gastroesophageal reflux disease)   . Hyperlipidemia   . Macular degeneration of both eyes   . Mitral valve prolapse   . Mitral valve regurgitation   . Presence of permanent cardiac pacemaker     Past Surgical History:  Procedure Laterality Date  . ABDOMINAL HYSTERECTOMY    . APPENDECTOMY    . BREAST SURGERY    . CARDIAC CATHETERIZATION    . ESOPHAGEAL DILATION    . EYE SURGERY Bilateral    Cataract Extraction with IOL  . INSERT / REPLACE / REMOVE PACEMAKER    . IRRIGATION AND DEBRIDEMENT HEMATOMA Left 08/21/2015   Procedure: IRRIGATION AND DEBRIDEMENT HEMATOMA;  Surgeon: Robert Bellow, MD;  Location: [facility] ORS;  Service: General;  Laterality: Left;  . KNEE ARTHROSCOPY Right   . LEFT OOPHORECTOMY Left   . MASTECTOMY Left 1978  . MASTECTOMY Right 1978  . OPEN REDUCTION INTERNAL FIXATION (ORIF) DISTAL RADIAL FRACTURE Right 02/14/2018   Procedure: OPEN REDUCTION INTERNAL FIXATION (ORIF) DISTAL RADIAL FRACTURE;  Surgeon: Hessie Knows, MD;  Location: [facility] ORS;  Service: Orthopedics;  Laterality: Right;  . PACEMAKER INSERTION  08/11/12  . TEMPORAL ARTERY BIOPSY / LIGATION    . TONSILLECTOMY      Social History   Socioeconomic History  . Marital status: Widowed    Spouse name: Not on file  . Number of children: 1  . Years of education: 84  . Highest education level: 11th grade  Occupational History  . Not on file  Tobacco Use  . Smoking status: Never Smoker  . Smokeless tobacco: Never Used  Substance and Sexual Activity  . Alcohol use: No    Alcohol/week: 0.0 standard drinks  . Drug use:  No  . Sexual activity: Not on file  Other Topics Concern  . Not on file  Social History Narrative   Widowed   1 son   Never smoker or smokeless tobacco user   No alcohol use   Full Code   Social Determinants of Health   Financial Resource Strain:   . Difficulty of Paying Living Expenses:   Food Insecurity:   . Worried About Charity fundraiser in the Last Year:   . Arboriculturist in the Last Year:   Transportation Needs:   . Film/video editor (Medical):   Marland Kitchen Lack of Transportation (Non-Medical):   Physical Activity:   . Days of Exercise per Week:   . Minutes of Exercise per Session:   Stress:   . Feeling of Stress :   Social Connections:   . Frequency of Communication with Friends and Family:   . Frequency of Social Gatherings with Friends and Family:   . Attends Religious Services:   . Active Member of Clubs or Organizations:   . Attends Archivist Meetings:   Marland Kitchen Marital Status:   Intimate Partner Violence:   . Fear of Current or Ex-Partner:   .  Emotionally Abused:   Marland Kitchen Physically Abused:   . Sexually Abused:     Family History  Problem Relation Age of Onset  . Hypertension Mother      Current Outpatient Medications:  .  apixaban (ELIQUIS) 2.5 MG TABS tablet, Take 1 tablet (2.5 mg total) by mouth 2 (two) times daily., Disp: 60 tablet, Rfl: 0 .  atorvastatin (LIPITOR) 20 MG tablet, Take 1 tablet (20 mg total) by mouth at bedtime., Disp: 30 tablet, Rfl: 0 .  carboxymethylcellulose (REFRESH PLUS) 0.5 % SOLN, 1 drop 3 (three) times daily as needed., Disp: , Rfl:  .  digoxin (LANOXIN) 0.125 MG tablet, Take 1 tablet (0.125 mg total) by mouth every other day. CHF- * Hold for HR < 60, Disp: 15 tablet, Rfl: 0 .  folic acid (FOLVITE) 1 MG tablet, Take 1 tablet (1 mg total) by mouth daily., Disp: 30 tablet, Rfl: 3 .  glimepiride (AMARYL) 2 MG tablet, Take 1 tablet (2 mg total) by mouth 2 (two) times daily., Disp: 60 tablet, Rfl: 0 .  isosorbide mononitrate  (IMDUR) 30 MG 24 hr tablet, Take 1 tablet by mouth daily., Disp: , Rfl:  .  lidocaine (LIDODERM) 5 %, Place 1 patch onto the skin daily. Apply patch to painful area in the  and remove at bedtime, Disp: 30 patch, Rfl: 0 .  metolazone (ZAROXOLYN) 2.5 MG tablet, Take 1 tablet (2.5 mg total) by mouth once a week. 1 tablet on Sunday, Disp: 4 tablet, Rfl: 0 .  metoprolol succinate (TOPROL-XL) 50 MG 24 hr tablet, Take 1 tablet (50 mg total) by mouth daily., Disp: 30 tablet, Rfl: 0 .  mirtazapine (REMERON) 7.5 MG tablet, Take 1 tablet (7.5 mg total) by mouth at bedtime., Disp: 30 tablet, Rfl: 0 .  Multiple Vitamins-Minerals (PRESERVISION AREDS 2 PO), Take 1 tablet by mouth 2 (two) times daily., Disp: , Rfl:  .  mycophenolate (CELLCEPT) 500 MG tablet, Take 1 tablet (500 mg total) by mouth 2 (two) times daily., Disp: 60 tablet, Rfl: 0 .  nitroGLYCERIN (NITROSTAT) 0.4 MG SL tablet, DISSOLVE ONE TABLET UNDER THE TONGUE AS NEEDED FOR CHEST PAIN. MAY REPEAT AS INDICATED BY YOUR DOCTOR., Disp: , Rfl:  .  omeprazole (PRILOSEC) 20 MG capsule, Take 1 capsule by mouth 1 day or 1 dose., Disp: , Rfl:  .  pantoprazole (PROTONIX) 40 MG tablet, Take 40 mg by mouth daily., Disp: , Rfl:  .  polyethylene glycol (MIRALAX / GLYCOLAX) 17 g packet, Take 17 g by mouth daily as needed., Disp: , Rfl:  .  potassium chloride (K-DUR,KLOR-CON) 10 MEQ tablet, Take 4 tablets (40 mEq total) by mouth daily. (Patient taking differently: Take 50 mEq by mouth daily. ), Disp: 120 tablet, Rfl: 0 .  potassium chloride (KLOR-CON) 10 MEQ tablet, Take 1 tablet by mouth in the morning and at bedtime., Disp: , Rfl:  .  torsemide (DEMADEX) 20 MG tablet, Take 1 tablet by mouth daily., Disp: , Rfl:   DG Chest Portable 1 View  Result Date: 07/02/2019 CLINICAL DATA:  Chest pain EXAM: PORTABLE CHEST 1 VIEW COMPARISON:  June 30, 2015 FINDINGS: The heart size and mediastinal contours a unchanged with cardiomegaly. Aortic knob calcifications. There is  prominence of the central pulmonary vasculature with mildly increased interstitial markings throughout both lungs. A left-sided pacemaker seen with the lead tip at the right ventricle. No acute osseous abnormality. IMPRESSION: Cardiomegaly and findings suggestive of interstitial edema. Electronically Signed   By: Ebony Cargo.D.  On: 07/02/2019 00:14    No images are attached to the encounter.   CMP Latest Ref Rng & Units 07/24/2019  Glucose 70 - 99 mg/dL 290(H)  BUN 8 - 23 mg/dL 16  Creatinine 0.44 - 1.00 mg/dL 0.82  Sodium 135 - 145 mmol/L 142  Potassium 3.5 - 5.1 mmol/L 3.6  Chloride 98 - 111 mmol/L 101  CO2 22 - 32 mmol/L 30  Calcium 8.9 - 10.3 mg/dL 9.1  Total Protein 6.5 - 8.1 g/dL 6.2(L)  Total Bilirubin 0.3 - 1.2 mg/dL 0.8  Alkaline Phos 38 - 126 U/L 80  AST 15 - 41 U/L 16  ALT 0 - 44 U/L 16   CBC Latest Ref Rng & Units 07/24/2019  WBC 4.0 - 10.5 K/uL 4.6  Hemoglobin 12.0 - 15.0 g/dL 12.0  Hematocrit 36.0 - 46.0 % 37.3  Platelets 150 - 400 K/uL 214     Assessment and plan: Patient is a 86 year old female with history of 1 month and then moderate to anemia of unclear etiology on mycophenolate.  This is a routine follow-up visit  Since September 2020 patient's hemoglobin has improved and remained stable between 11-12.  He remains on mycophenolate.  Also his hemolysis labs are improving And haptoglobin levels which were previously low to undetectable are now normal.  Reticulocyte count has also normalized.  Total bilirubin is normal.  All this indicates resolution of her hemolysis.  I will keep her on mycophenolate for another 3 months following which I will start a slow taper  Follow-up instructions: Return to clinic in 3 months for in person visit with CBC with differential CMP reticulocyte count and haptoglobin  I discussed the assessment and treatment plan with the patient. The patient was provided an opportunity to ask questions and all were answered. The patient agreed  with the plan and demonstrated an understanding of the instructions.   The patient was advised to call back or seek an in-person evaluation if the symptoms worsen or if the condition fails to improve as anticipated.  I provided 22 minutes of non face-to-face telephone visit time  Visit Diagnosis: 1. Autoimmune hemolytic anemia   2. High risk medication use     Dr. Randa Evens, MD, MPH [facility] at [facility] Tel- [phone] 07/28/2019 8:27 AM

## 2019-08-26 ENCOUNTER — Encounter: Payer: Self-pay | Admitting: Emergency Medicine

## 2019-08-26 ENCOUNTER — Inpatient Hospital Stay
Admission: EM | Admit: 2019-08-26 | Discharge: 2019-08-28 | DRG: 392 | Disposition: A | Discharge status: Home Health | Payer: Medicare Other | Attending: Internal Medicine | Admitting: Internal Medicine

## 2019-08-26 ENCOUNTER — Other Ambulatory Visit: Payer: Self-pay

## 2019-08-26 DIAGNOSIS — R339 Retention of urine, unspecified: Secondary | ICD-10-CM | POA: Diagnosis present

## 2019-08-26 DIAGNOSIS — Z961 Presence of intraocular lens: Secondary | ICD-10-CM | POA: Diagnosis present

## 2019-08-26 DIAGNOSIS — A084 Viral intestinal infection, unspecified: Secondary | ICD-10-CM | POA: Diagnosis present

## 2019-08-26 DIAGNOSIS — R197 Diarrhea, unspecified: Secondary | ICD-10-CM | POA: Diagnosis present

## 2019-08-26 DIAGNOSIS — I69351 Hemiplegia and hemiparesis following cerebral infarction affecting right dominant side: Secondary | ICD-10-CM

## 2019-08-26 DIAGNOSIS — I4891 Unspecified atrial fibrillation: Secondary | ICD-10-CM | POA: Diagnosis present

## 2019-08-26 DIAGNOSIS — R350 Frequency of micturition: Secondary | ICD-10-CM | POA: Diagnosis present

## 2019-08-26 DIAGNOSIS — E860 Dehydration: Secondary | ICD-10-CM | POA: Diagnosis present

## 2019-08-26 DIAGNOSIS — Z79899 Other long term (current) drug therapy: Secondary | ICD-10-CM

## 2019-08-26 DIAGNOSIS — R4182 Altered mental status, unspecified: Secondary | ICD-10-CM | POA: Diagnosis present

## 2019-08-26 DIAGNOSIS — I340 Nonrheumatic mitral (valve) insufficiency: Secondary | ICD-10-CM | POA: Diagnosis present

## 2019-08-26 DIAGNOSIS — Z888 Allergy status to other drugs, medicaments and biological substances status: Secondary | ICD-10-CM

## 2019-08-26 DIAGNOSIS — I5032 Chronic diastolic (congestive) heart failure: Secondary | ICD-10-CM | POA: Diagnosis present

## 2019-08-26 DIAGNOSIS — F418 Other specified anxiety disorders: Secondary | ICD-10-CM | POA: Diagnosis present

## 2019-08-26 DIAGNOSIS — I509 Heart failure, unspecified: Secondary | ICD-10-CM | POA: Diagnosis not present

## 2019-08-26 DIAGNOSIS — E785 Hyperlipidemia, unspecified: Secondary | ICD-10-CM | POA: Diagnosis present

## 2019-08-26 DIAGNOSIS — E861 Hypovolemia: Secondary | ICD-10-CM | POA: Diagnosis present

## 2019-08-26 DIAGNOSIS — Z20822 Contact with and (suspected) exposure to covid-19: Secondary | ICD-10-CM | POA: Diagnosis present

## 2019-08-26 DIAGNOSIS — Z9013 Acquired absence of bilateral breasts and nipples: Secondary | ICD-10-CM

## 2019-08-26 DIAGNOSIS — E871 Hypo-osmolality and hyponatremia: Secondary | ICD-10-CM | POA: Diagnosis present

## 2019-08-26 DIAGNOSIS — H3530 Unspecified macular degeneration: Secondary | ICD-10-CM | POA: Diagnosis present

## 2019-08-26 DIAGNOSIS — E876 Hypokalemia: Secondary | ICD-10-CM | POA: Diagnosis present

## 2019-08-26 DIAGNOSIS — Z950 Presence of cardiac pacemaker: Secondary | ICD-10-CM | POA: Diagnosis not present

## 2019-08-26 DIAGNOSIS — K859 Acute pancreatitis without necrosis or infection, unspecified: Secondary | ICD-10-CM | POA: Diagnosis not present

## 2019-08-26 DIAGNOSIS — E119 Type 2 diabetes mellitus without complications: Secondary | ICD-10-CM

## 2019-08-26 DIAGNOSIS — Z9841 Cataract extraction status, right eye: Secondary | ICD-10-CM

## 2019-08-26 DIAGNOSIS — Z7901 Long term (current) use of anticoagulants: Secondary | ICD-10-CM

## 2019-08-26 DIAGNOSIS — Z885 Allergy status to narcotic agent status: Secondary | ICD-10-CM

## 2019-08-26 DIAGNOSIS — N179 Acute kidney failure, unspecified: Secondary | ICD-10-CM | POA: Diagnosis not present

## 2019-08-26 DIAGNOSIS — I110 Hypertensive heart disease with heart failure: Secondary | ICD-10-CM | POA: Diagnosis present

## 2019-08-26 DIAGNOSIS — Z9842 Cataract extraction status, left eye: Secondary | ICD-10-CM

## 2019-08-26 DIAGNOSIS — Z853 Personal history of malignant neoplasm of breast: Secondary | ICD-10-CM | POA: Diagnosis not present

## 2019-08-26 DIAGNOSIS — R300 Dysuria: Secondary | ICD-10-CM | POA: Diagnosis present

## 2019-08-26 DIAGNOSIS — Z8673 Personal history of transient ischemic attack (TIA), and cerebral infarction without residual deficits: Secondary | ICD-10-CM

## 2019-08-26 DIAGNOSIS — Z8249 Family history of ischemic heart disease and other diseases of the circulatory system: Secondary | ICD-10-CM

## 2019-08-26 DIAGNOSIS — K219 Gastro-esophageal reflux disease without esophagitis: Secondary | ICD-10-CM | POA: Diagnosis present

## 2019-08-26 DIAGNOSIS — D5911 Warm autoimmune hemolytic anemia: Secondary | ICD-10-CM | POA: Diagnosis present

## 2019-08-26 DIAGNOSIS — I10 Essential (primary) hypertension: Secondary | ICD-10-CM | POA: Diagnosis present

## 2019-08-26 LAB — LIPASE, BLOOD: Lipase: 57 U/L — ABNORMAL HIGH (ref 11–51)

## 2019-08-26 LAB — URINALYSIS, COMPLETE (UACMP) WITH MICROSCOPIC
Bacteria, UA: NONE SEEN
Bilirubin Urine: NEGATIVE
Glucose, UA: NEGATIVE mg/dL
Hgb urine dipstick: NEGATIVE
Ketones, ur: NEGATIVE mg/dL
Leukocytes,Ua: NEGATIVE
Nitrite: NEGATIVE
Protein, ur: NEGATIVE mg/dL
Specific Gravity, Urine: 1.005 (ref 1.005–1.030)
Squamous Epithelial / HPF: NONE SEEN (ref 0–5)
pH: 7 (ref 5.0–8.0)

## 2019-08-26 LAB — CBC
HCT: 38.5 % (ref 36.0–46.0)
Hemoglobin: 13.5 g/dL (ref 12.0–15.0)
MCH: 27.6 pg (ref 26.0–34.0)
MCHC: 35.1 g/dL (ref 30.0–36.0)
MCV: 78.7 fL — ABNORMAL LOW (ref 80.0–100.0)
Platelets: 251 10*3/uL (ref 150–400)
RBC: 4.89 MIL/uL (ref 3.87–5.11)
RDW: 13.1 % (ref 11.5–15.5)
WBC: 7.1 10*3/uL (ref 4.0–10.5)
nRBC: 0 % (ref 0.0–0.2)

## 2019-08-26 LAB — GLUCOSE, CAPILLARY
Glucose-Capillary: 174 mg/dL — ABNORMAL HIGH (ref 70–99)
Glucose-Capillary: 196 mg/dL — ABNORMAL HIGH (ref 70–99)

## 2019-08-26 LAB — COMPREHENSIVE METABOLIC PANEL
ALT: 18 U/L (ref 0–44)
AST: 23 U/L (ref 15–41)
Albumin: 4.2 g/dL (ref 3.5–5.0)
Alkaline Phosphatase: 84 U/L (ref 38–126)
Anion gap: 20 — ABNORMAL HIGH (ref 5–15)
BUN: 47 mg/dL — ABNORMAL HIGH (ref 8–23)
CO2: 29 mmol/L (ref 22–32)
Calcium: 8.9 mg/dL (ref 8.9–10.3)
Chloride: 71 mmol/L — ABNORMAL LOW (ref 98–111)
Creatinine, Ser: 0.95 mg/dL (ref 0.44–1.00)
GFR calc Af Amer: 60 mL/min — ABNORMAL LOW (ref >60)
GFR calc non Af Amer: 52 mL/min — ABNORMAL LOW (ref >60)
Glucose, Bld: 243 mg/dL — ABNORMAL HIGH (ref 70–99)
Potassium: <2 mmol/L — CL (ref 3.5–5.1)
Sodium: 120 mmol/L — ABNORMAL LOW (ref 135–145)
Total Bilirubin: 1.6 mg/dL — ABNORMAL HIGH (ref 0.3–1.2)
Total Protein: 6.7 g/dL (ref 6.5–8.1)

## 2019-08-26 LAB — BLOOD GAS, VENOUS
Acid-Base Excess: 15.6 mmol/L — ABNORMAL HIGH (ref 0.0–2.0)
Bicarbonate: 40.8 mmol/L — ABNORMAL HIGH (ref 20.0–28.0)
O2 Saturation: 68.1 %
Patient temperature: 37
pCO2, Ven: 50 mmHg (ref 44.0–60.0)
pH, Ven: 7.52 — ABNORMAL HIGH (ref 7.250–7.430)
pO2, Ven: 31 mmHg — CL (ref 32.0–45.0)

## 2019-08-26 LAB — SARS CORONAVIRUS 2 BY RT PCR (HOSPITAL ORDER, PERFORMED IN ~~LOC~~ HOSPITAL LAB): SARS Coronavirus 2: NEGATIVE

## 2019-08-26 LAB — BETA-HYDROXYBUTYRIC ACID: Beta-Hydroxybutyric Acid: 0.83 mmol/L — ABNORMAL HIGH (ref 0.05–0.27)

## 2019-08-26 LAB — BRAIN NATRIURETIC PEPTIDE: B Natriuretic Peptide: 143 pg/mL — ABNORMAL HIGH (ref 0.0–100.0)

## 2019-08-26 LAB — MAGNESIUM: Magnesium: 2.1 mg/dL (ref 1.7–2.4)

## 2019-08-26 MED ORDER — ATORVASTATIN CALCIUM 20 MG PO TABS
20.0000 mg | ORAL_TABLET | Freq: Every day | ORAL | Status: DC
  Administered 2019-08-26 – 2019-08-27 (×2): 20 mg via ORAL
  Filled 2019-08-26 (×2): qty 1

## 2019-08-26 MED ORDER — INSULIN ASPART 100 UNIT/ML ~~LOC~~ SOLN
0.0000 [IU] | Freq: Every day | SUBCUTANEOUS | Status: DC
  Administered 2019-08-26 – 2019-08-27 (×2): 2 [IU] via SUBCUTANEOUS
  Administered 2019-08-28: 5 [IU] via SUBCUTANEOUS
  Filled 2019-08-26: qty 1

## 2019-08-26 MED ORDER — POTASSIUM CHLORIDE CRYS ER 20 MEQ PO TBCR
40.0000 meq | EXTENDED_RELEASE_TABLET | Freq: Once | ORAL | Status: AC
  Administered 2019-08-26: 40 meq via ORAL
  Filled 2019-08-26: qty 2

## 2019-08-26 MED ORDER — INSULIN ASPART 100 UNIT/ML ~~LOC~~ SOLN
0.0000 [IU] | Freq: Three times a day (TID) | SUBCUTANEOUS | Status: DC
  Administered 2019-08-27: 1 [IU] via SUBCUTANEOUS
  Administered 2019-08-27: 7 [IU] via SUBCUTANEOUS
  Administered 2019-08-27 – 2019-08-28 (×2): 2 [IU] via SUBCUTANEOUS
  Administered 2019-08-28: 1 [IU] via SUBCUTANEOUS
  Filled 2019-08-26 (×2): qty 1

## 2019-08-26 MED ORDER — APIXABAN 2.5 MG PO TABS
2.5000 mg | ORAL_TABLET | Freq: Two times a day (BID) | ORAL | Status: DC
  Administered 2019-08-26 – 2019-08-28 (×4): 2.5 mg via ORAL
  Filled 2019-08-26 (×5): qty 1

## 2019-08-26 MED ORDER — POTASSIUM CHLORIDE 10 MEQ/100ML IV SOLN
10.0000 meq | INTRAVENOUS | Status: AC
  Administered 2019-08-26 (×4): 10 meq via INTRAVENOUS
  Filled 2019-08-26 (×4): qty 100

## 2019-08-26 MED ORDER — MIRTAZAPINE 15 MG PO TABS
15.0000 mg | ORAL_TABLET | Freq: Every day | ORAL | Status: DC
  Administered 2019-08-26 – 2019-08-27 (×2): 15 mg via ORAL
  Filled 2019-08-26 (×2): qty 1

## 2019-08-26 MED ORDER — PANTOPRAZOLE SODIUM 40 MG PO TBEC
40.0000 mg | DELAYED_RELEASE_TABLET | Freq: Every day | ORAL | Status: DC
  Administered 2019-08-27 – 2019-08-28 (×2): 40 mg via ORAL
  Filled 2019-08-26 (×2): qty 1

## 2019-08-26 MED ORDER — POTASSIUM CHLORIDE CRYS ER 20 MEQ PO TBCR
40.0000 meq | EXTENDED_RELEASE_TABLET | ORAL | Status: AC
  Administered 2019-08-26 – 2019-08-27 (×2): 40 meq via ORAL
  Filled 2019-08-26 (×2): qty 2

## 2019-08-26 MED ORDER — OCUVITE-LUTEIN PO CAPS
2.0000 | ORAL_CAPSULE | Freq: Two times a day (BID) | ORAL | Status: DC
  Administered 2019-08-27 – 2019-08-28 (×3): 2 via ORAL
  Filled 2019-08-26 (×5): qty 2

## 2019-08-26 MED ORDER — MAGNESIUM SULFATE IN D5W 1-5 GM/100ML-% IV SOLN
1.0000 g | Freq: Once | INTRAVENOUS | Status: AC
  Administered 2019-08-26: 1 g via INTRAVENOUS
  Filled 2019-08-26: qty 100

## 2019-08-26 MED ORDER — ONDANSETRON HCL 4 MG/2ML IJ SOLN: 4.0000 mg | Freq: Three times a day (TID) | INTRAMUSCULAR | Status: DC | PRN

## 2019-08-26 MED ORDER — ACETAMINOPHEN 325 MG PO TABS: 650.0000 mg | ORAL_TABLET | Freq: Four times a day (QID) | ORAL | Status: DC | PRN

## 2019-08-26 MED ORDER — NITROGLYCERIN 0.4 MG SL SUBL: 0.4000 mg | SUBLINGUAL_TABLET | SUBLINGUAL | Status: DC | PRN

## 2019-08-26 MED ORDER — SODIUM CHLORIDE 0.9% FLUSH: 3.0000 mL | Freq: Once | INTRAVENOUS | Status: DC

## 2019-08-26 MED ORDER — ISOSORBIDE MONONITRATE ER 30 MG PO TB24
30.0000 mg | ORAL_TABLET | Freq: Every day | ORAL | Status: DC
  Administered 2019-08-26 – 2019-08-28 (×3): 30 mg via ORAL
  Filled 2019-08-26 (×3): qty 1

## 2019-08-26 MED ORDER — POLYVINYL ALCOHOL 1.4 % OP SOLN
1.0000 [drp] | Freq: Three times a day (TID) | OPHTHALMIC | Status: DC | PRN
  Filled 2019-08-26: qty 15

## 2019-08-26 MED ORDER — MYCOPHENOLATE MOFETIL 250 MG PO CAPS
500.0000 mg | ORAL_CAPSULE | Freq: Two times a day (BID) | ORAL | Status: DC
  Administered 2019-08-26 – 2019-08-28 (×4): 500 mg via ORAL
  Filled 2019-08-26 (×6): qty 2

## 2019-08-26 MED ORDER — METOPROLOL SUCCINATE ER 50 MG PO TB24
50.0000 mg | ORAL_TABLET | Freq: Every day | ORAL | Status: DC
  Administered 2019-08-26 – 2019-08-28 (×3): 50 mg via ORAL
  Filled 2019-08-26 (×3): qty 1

## 2019-08-26 MED ORDER — DIGOXIN 125 MCG PO TABS
0.1250 mg | ORAL_TABLET | ORAL | Status: DC
  Administered 2019-08-28: 0.125 mg via ORAL
  Filled 2019-08-26: qty 1

## 2019-08-26 MED ORDER — FAMOTIDINE 20 MG PO TABS
20.0000 mg | ORAL_TABLET | Freq: Every day | ORAL | Status: DC
  Administered 2019-08-26 – 2019-08-27 (×2): 20 mg via ORAL
  Filled 2019-08-26 (×2): qty 1

## 2019-08-26 MED ORDER — SODIUM CHLORIDE 0.9 % IV BOLUS
500.0000 mL | Freq: Once | INTRAVENOUS | Status: AC
  Administered 2019-08-26: 500 mL via INTRAVENOUS

## 2019-08-26 MED ORDER — SODIUM CHLORIDE 0.9 % IV SOLN: INTRAVENOUS | Status: DC

## 2019-08-26 NOTE — ED Triage Notes (Signed)
Pt to ED via [facility] from home for diarrhea. Pt states that she had an episode on Wednesday night of diarrhea and increased urination. Pt reports having a UTI but states that she is not on medication for it. When asked who diagnosed her with the UTI, she states that she did. Pt reports last night she had another episode of diarrhea and increased urination. Pt states that she is "just sick".

## 2019-08-26 NOTE — ED Notes (Signed)
Attempted to call pt's [facility], but was not able to get through calling both mobile and home.

## 2019-08-26 NOTE — ED Triage Notes (Signed)
First nurse note- current UTI. Came via EMS for diarrhea.  VSS with EMS. CBG 279 with EMS.

## 2019-08-26 NOTE — ED Notes (Signed)
Assisted pt to commode. Pt reported urine output and a bowel movement.

## 2019-08-26 NOTE — ED Notes (Signed)
Dr Blaine Hamper at bedside assessed pt's bladder to be full. Performed in/out cath pt had 525 ml output.

## 2019-08-26 NOTE — ED Notes (Signed)
Pt pulled for repeat draw of green tube.  Lab reports need recollect to verify results.
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
## 2019-08-26 NOTE — H&P (Signed)
History and Physical    Elizabeth Novak UEA:[id number] DOB: 10-27-1923 DOA: 08/26/2019  Referring MD/NP/PA:   PCP: Danella Penton, MD   Patient coming from:  The patient is coming from home.  At baseline, pt is independent for most of ADL.        Chief Complaint: Diarrhea  HPI: Elizabeth Novak is a 86 y.o. female with medical history significant of hyperlipidemia, diabetes mellitus, stroke, GERD, depression, pacemaker placement, mitral valve prolapse, mitral valve prolapse, diverticulitis, dCHF, breast cancer, atrial fibrillation on Eliquis, anemia, who presents with diarrhea.  Patient states that her diarrhea started on Wednesday.  She states that she has had many times of watery diarrhea so far.  She has nausea, but no vomiting.  No abdominal pain.  Denies fever or chills.  Patient does not have chest pain, shortness breath.  She has some mild dysuria and increased urinary frequency.  No unilateral weakness.  Patient is taking CellCept, but she does not know why she is taking these medications.   ED Course: pt was found to have WBC 7.1, lipase of 57, pending COVID-19 PCR, potassium less than 2, sodium 120, renal function okay, pending UA, temperature normal, blood pressure 108/62, heart rate 71, RR 16, oxygen saturation 97% on room air.  Patient is admitted to MedSurg bed as inpatient.  Review of Systems:   General: no fevers, chills, no body weight gain, has poor appetite, has fatigue HEENT: no blurry vision, hearing changes or sore throat Respiratory: no dyspnea, coughing, wheezing CV: no chest pain, no palpitations GI: has nausea and diarrhea, no vomiting, abdominal pain, constipation GU: has dysuria, increased urinary frequency, no hematuria  Ext: no leg edema Neuro: no unilateral weakness, numbness, or tingling, no vision change or hearing loss Skin: no rash, no skin tear. MSK: No muscle spasm, no deformity, no limitation of range of movement in spin Heme: No easy  bruising.  Travel history: No recent long distant travel.  Allergy:  Allergies  Allergen Reactions  . Codeine Nausea Only  . Disopyramide     Other reaction(s): Unknown  . Ibuprofen Diarrhea  . Iodine     blisters  . Metformin And Related   . Norpace [Disopyramide Phosphate]   . Quinidine     Other reaction(s): Unknown  . Terfenadine     Other reaction(s): Unknown  . Topiramate     Other reaction(s): Other (See Comments) Hair loss  . Verapamil     Other reaction(s): Unknown  . Dexamethasone Sodium Phosphate Palpitations    Past Medical History:  Diagnosis Date  . A-fib (HCC)   . Anemia   . Arthritis   . Breast cancer (HCC) 1978   left breast with lymph node removal  . CHF (congestive heart failure) (HCC)   . Diabetes mellitus without complication (HCC)   . Diverticulitis   . Dysrhythmia   . GERD (gastroesophageal reflux disease)   . Hyperlipidemia   . Macular degeneration of both eyes   . Mitral valve prolapse   . Mitral valve regurgitation   . Presence of permanent cardiac pacemaker     Past Surgical History:  Procedure Laterality Date  . ABDOMINAL HYSTERECTOMY    . APPENDECTOMY    . BREAST SURGERY    . CARDIAC CATHETERIZATION    . ESOPHAGEAL DILATION    . EYE SURGERY Bilateral    Cataract Extraction with IOL  . INSERT / REPLACE / REMOVE PACEMAKER    . IRRIGATION AND DEBRIDEMENT HEMATOMA Left  08/21/2015   Procedure: IRRIGATION AND DEBRIDEMENT HEMATOMA;  Surgeon: Earline Mayotte, MD;  Location: [facility] ORS;  Service: General;  Laterality: Left;  . KNEE ARTHROSCOPY Right   . LEFT OOPHORECTOMY Left   . MASTECTOMY Left 1978  . MASTECTOMY Right 1978  . OPEN REDUCTION INTERNAL FIXATION (ORIF) DISTAL RADIAL FRACTURE Right 02/14/2018   Procedure: OPEN REDUCTION INTERNAL FIXATION (ORIF) DISTAL RADIAL FRACTURE;  Surgeon: Kennedy Bucker, MD;  Location: [facility] ORS;  Service: Orthopedics;  Laterality: Right;  . PACEMAKER INSERTION  08/11/12  . TEMPORAL ARTERY BIOPSY /  LIGATION    . TONSILLECTOMY      Social History:  reports that she has never smoked. She has never used smokeless tobacco. She reports that she does not drink alcohol or use drugs.  Family History:  Family History  Problem Relation Age of Onset  . Hypertension Mother      Prior to Admission medications   Medication Sig Start Date End Date Taking? Authorizing Provider  apixaban (ELIQUIS) 2.5 MG TABS tablet Take 1 tablet (2.5 mg total) by mouth 2 (two) times daily. 05/10/18   Sharee Holster, NP  atorvastatin (LIPITOR) 20 MG tablet Take 1 tablet (20 mg total) by mouth at bedtime. 05/10/18   Sharee Holster, NP  carboxymethylcellulose (REFRESH PLUS) 0.5 % SOLN 1 drop 3 (three) times daily as needed.    [provider]  digoxin (LANOXIN) 0.125 MG tablet Take 1 tablet (0.125 mg total) by mouth every other day. CHF- * Hold for HR < 60 05/10/18   Sharee Holster, NP  famotidine (PEPCID) 20 MG tablet SMARTSIG:1 Tablet(s) By Mouth Every Evening 08/24/19   [provider]  folic acid (FOLVITE) 1 MG tablet Take 1 tablet (1 mg total) by mouth daily. 06/20/18   Creig Hines, MD  glimepiride (AMARYL) 2 MG tablet Take 1 tablet (2 mg total) by mouth 2 (two) times daily. 05/10/18   Sharee Holster, NP  isosorbide mononitrate (IMDUR) 30 MG 24 hr tablet Take 1 tablet by mouth daily. 06/05/19   [provider]  lidocaine (LIDODERM) 5 % Place 1 patch onto the skin daily. Apply patch to painful area in the  and remove at bedtime 05/10/18   Sharee Holster, NP  metolazone (ZAROXOLYN) 2.5 MG tablet Take 1 tablet (2.5 mg total) by mouth once a week. 1 tablet on Sunday 05/10/18   Sharee Holster, NP  metoprolol succinate (TOPROL-XL) 50 MG 24 hr tablet Take 1 tablet (50 mg total) by mouth daily. 05/10/18   Sharee Holster, NP  mirtazapine (REMERON) 7.5 MG tablet Take 1 tablet (7.5 mg total) by mouth at bedtime. 05/10/18   Sharee Holster, NP  Multiple Vitamins-Minerals (PRESERVISION AREDS 2 PO) Take  1 tablet by mouth 2 (two) times daily.    [provider]  mycophenolate (CELLCEPT) 500 MG tablet Take 1 tablet (500 mg total) by mouth 2 (two) times daily. 05/10/18   Sharee Holster, NP  nitroGLYCERIN (NITROSTAT) 0.4 MG SL tablet DISSOLVE ONE TABLET UNDER THE TONGUE AS NEEDED FOR CHEST PAIN. MAY REPEAT AS INDICATED BY YOUR DOCTOR. 03/08/19   [provider]  omeprazole (PRILOSEC) 20 MG capsule Take 1 capsule by mouth 1 day or 1 dose. 11/01/18 11/01/19  [provider]  pantoprazole (PROTONIX) 40 MG tablet Take 40 mg by mouth daily.    [provider]  polyethylene glycol (MIRALAX / GLYCOLAX) 17 g packet Take 17 g by mouth daily as  needed.    [provider]  potassium chloride (K-DUR,KLOR-CON) 10 MEQ tablet Take 4 tablets (40 mEq total) by mouth daily. Patient taking differently: Take 50 mEq by mouth daily.  05/10/18   Sharee Holster, NP  potassium chloride (KLOR-CON) 10 MEQ tablet Take 1 tablet by mouth in the morning and at bedtime. 02/15/19   [provider]  torsemide (DEMADEX) 20 MG tablet Take 1 tablet by mouth daily. 02/15/19   [provider]    Physical Exam: Vitals:   08/26/19 1244 08/26/19 1500  BP: 108/62 138/60  Pulse: 71 73  Resp: 16 18  Temp: 97.7 F (36.5 C)   TempSrc: Oral   SpO2: 97% 97%  Weight: 40.8 kg   Height: 5\' 4"  (1.626 m)    General: Not in acute distress HEENT:       Eyes: PERRL, EOMI, no scleral icterus.       ENT: No discharge from the ears and nose, no pharynx injection, no tonsillar enlargement.        Neck: No JVD, no bruit, no mass felt. Heme: No neck lymph node enlargement. Cardiac: S1/S2, RRR, No murmurs, No gallops or rubs. Respiratory:  No rales, wheezing, rhonchi or rubs. GI: Soft, nondistended, nontender, no rebound pain, no organomegaly, BS present. GU: No hematuria Ext: No pitting leg edema bilaterally. 2+DP/PT pulse bilaterally. Musculoskeletal: No joint deformities, No joint  redness or warmth, no limitation of ROM in spin. Skin: No rashes.  Neuro: Alert, oriented X3, cranial nerves II-XII grossly intact, moves all extremities normally.  Psych: Patient is not psychotic, no suicidal or hemocidal ideation.  Labs on Admission: I have personally reviewed following labs and imaging studies  CBC: Recent Labs  Lab 08/26/19 1255  WBC 7.1  HGB 13.5  HCT 38.5  MCV 78.7*  PLT 251   Basic Metabolic Panel: Recent Labs  Lab 08/26/19 1354 08/26/19 1534  NA 120*  --   K <2.0*  --   CL 71*  --   CO2 29  --   GLUCOSE 243*  --   BUN 47*  --   CREATININE 0.95  --   CALCIUM 8.9  --   MG  --  2.1   GFR: Estimated Creatinine Clearance: 23.8 mL/min (by C-G formula based on SCr of 0.95 mg/dL). Liver Function Tests: Recent Labs  Lab 08/26/19 1354  AST 23  ALT 18  ALKPHOS 84  BILITOT 1.6*  PROT 6.7  ALBUMIN 4.2   Recent Labs  Lab 08/26/19 1354  LIPASE 57*   No results for input(s): AMMONIA in the last 168 hours. Coagulation Profile: No results for input(s): INR, PROTIME in the last 168 hours. Cardiac Enzymes: No results for input(s): CKTOTAL, CKMB, CKMBINDEX, TROPONINI in the last 168 hours. BNP (last 3 results) No results for input(s): PROBNP in the last 8760 hours. HbA1C: No results for input(s): HGBA1C in the last 72 hours. CBG: No results for input(s): GLUCAP in the last 168 hours. Lipid Profile: No results for input(s): CHOL, HDL, LDLCALC, TRIG, CHOLHDL, LDLDIRECT in the last 72 hours. Thyroid Function Tests: No results for input(s): TSH, T4TOTAL, FREET4, T3FREE, THYROIDAB in the last 72 hours. Anemia Panel: No results for input(s): VITAMINB12, FOLATE, FERRITIN, TIBC, IRON, RETICCTPCT in the last 72 hours. Urine analysis:    Component Value Date/Time   COLORURINE STRAW (A) 08/26/2019 1815   APPEARANCEUR CLEAR (A) 08/26/2019 1815   LABSPEC 1.005 08/26/2019 1815   PHURINE 7.0 08/26/2019 1815   GLUCOSEU  NEGATIVE 08/26/2019 1815   HGBUR  NEGATIVE 08/26/2019 1815   BILIRUBINUR NEGATIVE 08/26/2019 1815   KETONESUR NEGATIVE 08/26/2019 1815   PROTEINUR NEGATIVE 08/26/2019 1815   NITRITE NEGATIVE 08/26/2019 1815   LEUKOCYTESUR NEGATIVE 08/26/2019 1815   Sepsis Labs: @LABRCNTIP (procalcitonin:4,lacticidven:4) ) Recent Results (from the past 240 hour(s))  SARS Coronavirus 2 by RT PCR (hospital order, performed in [facility] hospital lab) Nasopharyngeal Nasopharyngeal Swab     Status: None   Collection Time: 08/26/19  3:34 PM   Specimen: Nasopharyngeal Swab  Result Value Ref Range Status   SARS Coronavirus 2 NEGATIVE NEGATIVE Final    Comment: (NOTE) SARS-CoV-2 target nucleic acids are NOT DETECTED. The SARS-CoV-2 RNA is generally detectable in upper and lower respiratory specimens during the acute phase of infection. The lowest concentration of SARS-CoV-2 viral copies this assay can detect is 250 copies / mL. A negative result does not preclude SARS-CoV-2 infection and should not be used as the sole basis for treatment or other patient management decisions.  A negative result may occur with improper specimen collection / handling, submission of specimen other than nasopharyngeal swab, presence of viral mutation(s) within the areas targeted by this assay, and inadequate number of viral copies (<250 copies / mL). A negative result must be combined with clinical observations, patient history, and epidemiological information. Fact Sheet for Patients:   BoilerBrush.com.cy Fact Sheet for Healthcare Providers: https://pope.com/ This test is not yet approved or cleared  by the [place] FDA and has been authorized for detection and/or diagnosis of SARS-CoV-2 by FDA under an Emergency Use Authorization (EUA).  This EUA will remain in effect (meaning this test can be used) for the duration of the COVID-19 declaration under Section 564(b)(1) of the Act, 21 U.S.C. section  360bbb-3(b)(1), unless the authorization is terminated or revoked sooner. Performed at [facility], [address] [place], [place] [address]      Radiological Exams on Admission: No results found.   EKG:  Not done in ED, will get one.   Assessment/Plan Principal Problem:   Diarrhea Active Problems:   Atrial fibrillation (HCC)   GERD without esophagitis   Hypokalemia   Anxiety with depression   History of CVA (cerebrovascular accident)   HTN (hypertension)   HLD (hyperlipidemia)   Hyponatremia   Diabetes mellitus without complication (HCC)   Chronic diastolic CHF (congestive heart failure) (HCC)   Diarrhea: Etiology is not clear.  No abdominal pain he has nausea but no vomiting.  Likely due to viral enteritis -Admit to MedSurg bed as inpatient -IV fluid: 500 cc normal saline, followed by 75 cc/h -Follow-up C diff PCR and GI pathogen panel -As needed Zofran  Atrial fibrillation (HCC) -Continue Eliquis and metoprolol -Continue digoxin  GERD without esophagitis -Pepcid and Protonix  Hypokalemia: K < 2.0  on admission. - Repleted with total of 160 mEq of KCl - Check Mg level - Give 1 g of magnesium sulfate  Depression and anxiety: Stable, no suicidal or homicidal ideations. -Continue home medications   History of CVA (cerebrovascular accident) -lipitor -pt is on Eliquis for a fib  HTN:  -Continue home medications: Metoprolol -hydralazine prn  HLD (hyperlipidemia) -Lipitor  Hyponatremia: Most likely due to GI loss and poor oral intake and dehydration - Will check urine sodium, urine osmolality, serum osmolality. - check TSH - IVF: 500 cc NS in ED, will continue with IV normal saline at 75 mL/h - f/u by BMP q8h - avoid over correction too fast due to  risk of central pontine myelinolysis  Diabetes mellitus without complication (HCC): Most recent A1c 8.5, poorly controled. Patient is taking Amaryl at home -SSI  Chronic diastolic CHF  (congestive heart failure) (HCC): 2D echo on 07/03/2015 showed EF of 55-60%.  Patient does not have leg edema JVD.  CHF seem to be compensated. -Hold diuretics since patient need IV fluid due to hyponatremia       DVT ppx: SQ Lovenox Code Status: Full code Family Communication: not done, no family member is at bed side. I have tried to call her Niece without success Disposition Plan:  Anticipate discharge back to previous home environment Consults called:  none Admission status: Med-surg bed as inpt      Status is: Inpatient  Remains inpatient appropriate because:Inpatient level of care appropriate due to severity of illness.  Patient has multiple comorbidities, now presents with severe diarrhea with electrolytes disturbance, particularly hyponatremia with sodium 120 which will need 2 days to be repleted in order to avoid over correction due to risk of central pontine myelinolysis   Dispo: The patient is from: Home              Anticipated d/c is to: Home              Anticipated d/c date is: 2 days              Patient currently is not medically stable to d/c.           Date of Service 08/26/2019    Lorretta Harp [facility]   If 7PM-7AM, please contact night-coverage [email or web address] 08/26/2019, 6:48 PM

## 2019-08-27 DIAGNOSIS — R197 Diarrhea, unspecified: Secondary | ICD-10-CM

## 2019-08-27 DIAGNOSIS — N179 Acute kidney failure, unspecified: Secondary | ICD-10-CM | POA: Diagnosis not present

## 2019-08-27 LAB — BASIC METABOLIC PANEL
Anion gap: 13 (ref 5–15)
Anion gap: 9 (ref 5–15)
Anion gap: 10 (ref 5–15)
Anion gap: 12 (ref 5–15)
BUN: 38 mg/dL — ABNORMAL HIGH (ref 8–23)
BUN: 20 mg/dL (ref 8–23)
BUN: 34 mg/dL — ABNORMAL HIGH (ref 8–23)
BUN: 31 mg/dL — ABNORMAL HIGH (ref 8–23)
CO2: 30 mmol/L (ref 22–32)
CO2: 23 mmol/L (ref 22–32)
CO2: 31 mmol/L (ref 22–32)
CO2: 29 mmol/L (ref 22–32)
Calcium: 8.7 mg/dL — ABNORMAL LOW (ref 8.9–10.3)
Calcium: 8.4 mg/dL — ABNORMAL LOW (ref 8.9–10.3)
Calcium: 8.9 mg/dL (ref 8.9–10.3)
Calcium: 8.9 mg/dL (ref 8.9–10.3)
Chloride: 85 mmol/L — ABNORMAL LOW (ref 98–111)
Chloride: 103 mmol/L (ref 98–111)
Chloride: 89 mmol/L — ABNORMAL LOW (ref 98–111)
Chloride: 89 mmol/L — ABNORMAL LOW (ref 98–111)
Creatinine, Ser: 0.82 mg/dL (ref 0.44–1.00)
Creatinine, Ser: 1.4 mg/dL — ABNORMAL HIGH (ref 0.44–1.00)
Creatinine, Ser: 0.88 mg/dL (ref 0.44–1.00)
Creatinine, Ser: 1.13 mg/dL — ABNORMAL HIGH (ref 0.44–1.00)
GFR calc Af Amer: >60 mL/min (ref >60)
GFR calc Af Amer: 37 mL/min — ABNORMAL LOW (ref >60)
GFR calc Af Amer: >60 mL/min (ref >60)
GFR calc Af Amer: 49 mL/min — ABNORMAL LOW (ref >60)
GFR calc non Af Amer: >60 mL/min (ref >60)
GFR calc non Af Amer: 32 mL/min — ABNORMAL LOW (ref >60)
GFR calc non Af Amer: 57 mL/min — ABNORMAL LOW (ref >60)
GFR calc non Af Amer: 42 mL/min — ABNORMAL LOW (ref >60)
Glucose, Bld: 132 mg/dL — ABNORMAL HIGH (ref 70–99)
Glucose, Bld: 132 mg/dL — ABNORMAL HIGH (ref 70–99)
Glucose, Bld: 153 mg/dL — ABNORMAL HIGH (ref 70–99)
Glucose, Bld: 234 mg/dL — ABNORMAL HIGH (ref 70–99)
Potassium: 2.7 mmol/L — CL (ref 3.5–5.1)
Potassium: 4.4 mmol/L (ref 3.5–5.1)
Potassium: 2.9 mmol/L — ABNORMAL LOW (ref 3.5–5.1)
Potassium: 3.6 mmol/L (ref 3.5–5.1)
Sodium: 128 mmol/L — ABNORMAL LOW (ref 135–145)
Sodium: 135 mmol/L (ref 135–145)
Sodium: 130 mmol/L — ABNORMAL LOW (ref 135–145)
Sodium: 130 mmol/L — ABNORMAL LOW (ref 135–145)

## 2019-08-27 LAB — CBC
HCT: 33.2 % — ABNORMAL LOW (ref 36.0–46.0)
Hemoglobin: 11.6 g/dL — ABNORMAL LOW (ref 12.0–15.0)
MCH: 27.8 pg (ref 26.0–34.0)
MCHC: 34.9 g/dL (ref 30.0–36.0)
MCV: 79.6 fL — ABNORMAL LOW (ref 80.0–100.0)
Platelets: 237 10*3/uL (ref 150–400)
RBC: 4.17 MIL/uL (ref 3.87–5.11)
RDW: 13.1 % (ref 11.5–15.5)
WBC: 5.6 10*3/uL (ref 4.0–10.5)
nRBC: 0 % (ref 0.0–0.2)

## 2019-08-27 LAB — NA AND K (SODIUM & POTASSIUM), RAND UR
Potassium Urine: 25 mmol/L
Sodium, Ur: 14 mmol/L

## 2019-08-27 LAB — SODIUM, URINE, RANDOM: Sodium, Ur: 46 mmol/L

## 2019-08-27 LAB — TSH: TSH: 2.787 u[IU]/mL (ref 0.350–4.500)

## 2019-08-27 LAB — OSMOLALITY, URINE: Osmolality, Ur: 239 mOsm/kg — ABNORMAL LOW (ref 300–900)

## 2019-08-27 LAB — GLUCOSE, CAPILLARY
Glucose-Capillary: 304 mg/dL — ABNORMAL HIGH (ref 70–99)
Glucose-Capillary: 178 mg/dL — ABNORMAL HIGH (ref 70–99)
Glucose-Capillary: 146 mg/dL — ABNORMAL HIGH (ref 70–99)
Glucose-Capillary: 215 mg/dL — ABNORMAL HIGH (ref 70–99)

## 2019-08-27 LAB — OSMOLALITY: Osmolality: 271 mOsm/kg — ABNORMAL LOW (ref 275–295)

## 2019-08-27 MED ORDER — POTASSIUM CHLORIDE CRYS ER 20 MEQ PO TBCR
40.0000 meq | EXTENDED_RELEASE_TABLET | ORAL | Status: AC
  Administered 2019-08-27 (×2): 40 meq via ORAL
  Filled 2019-08-27 (×2): qty 2

## 2019-08-27 NOTE — ED Notes (Signed)
Attempted to call hospitalist in regards to critical potassium 2.6, left message.

## 2019-08-27 NOTE — Progress Notes (Signed)
Physical Therapy Evaluation Patient Details Name: Elizabeth Novak MRN: [id number] DOB: 1923/11/03 Today's Date: 08/27/2019   History of Present Illness  Elizabeth Novak is a 86 y.o. 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   Clinical Impression  Pt was seen for mobility of gait and transfers, note her struggle to get up and use quad cane but also weak effort to grip PT hands to attempt gait.  Pt is a better candidate for RW use and will need assist for now to walk.  Quad cane use is unsafe, insufficient help and pt has low endurance for standing as well.  Follow acutely for a safer course of gait and to increase both LE strength and standing balance control.  Will expect her home care to be 24/7 as she states it will be for home therapy plan to be appropriate.    Follow Up Recommendations Home health PT;Supervision/Assistance - 24 hour;Supervision for mobility/OOB    Equipment Recommendations  None recommended by PT    Recommendations for Other Services       Precautions / Restrictions Precautions Precautions: None Restrictions Weight Bearing Restrictions: No      Mobility  Bed Mobility Overal bed mobility: Needs Assistance Bed Mobility: Supine to Sit;Sit to Supine     Supine to sit: Supervision;HOB elevated Sit to supine: Supervision;HOB elevated   General bed mobility comments: SBA + HOB elevated   Transfers Overall transfer level: Needs assistance Equipment used: Quad cane;Rolling walker (2 wheeled) Transfers: Sit to/from Stand Sit to Stand: Min assist         General transfer comment: MIN A + quad cane decreasing to CGA c  RW  Ambulation/Gait Ambulation/Gait assistance: Min Chemical engineer (Feet): 5 Feet Assistive device: 1 person hand held assist;Quad cane Gait Pattern/deviations: Step-to pattern;Decreased stride length;Wide base of support;Drifts right/left     General Gait Details: pt was unsafe on quad cane with use of a single point on it, HHA with pt refusing to hold onto PT hands with any real effort  Stairs            Wheelchair Mobility    Modified Rankin (Stroke Patients Only)       Balance Overall balance assessment: Needs assistance Sitting-balance support: Feet supported;No upper extremity supported Sitting balance-Leahy Scale: Good     Standing balance support: No upper extremity supported;During functional activity Standing balance-Leahy Scale: Fair                               Pertinent Vitals/Pain Pain Assessment: No/denies pain    Home Living Family/patient expects to be discharged to:: Private residence Living Arrangements: Alone Available Help at Discharge: Personal care attendant;Available PRN/intermittently Type of Home: House Home Access: Stairs to enter Entrance Stairs-Rails: Right Entrance Stairs-Number of Steps: 2 Home Layout: One level Home Equipment: Shower seat;Grab bars - tub/shower;Toilet riser;Cane - quad      Prior Function Level of Independence: Independent with assistive device(s)         Comments: quad cane with furniture walking, was assisted by aides to cook     Hand Dominance   Dominant Hand: Left    Extremity/Trunk Assessment   Upper Extremity Assessment Upper Extremity  Assessment: Generalized weakness    Lower Extremity Assessment Lower Extremity Assessment: RLE deficits/detail RLE Deficits / Details: R leg 3+ to 4-, RLE 4 to 4+ RLE Coordination: decreased gross motor;decreased fine motor    Cervical / Trunk Assessment Cervical / Trunk Assessment: Kyphotic  Communication   Communication: HOH   Cognition Arousal/Alertness: Awake/alert Behavior During Therapy: WFL for tasks assessed/performed Overall Cognitive Status: Impaired/Different from baseline Area of Impairment: Memory;Safety/judgement;Problem solving                     Memory: Decreased short-term memory;Decreased recall of precautions   Safety/Judgement: Decreased awareness of safety;Decreased awareness of deficits   Problem Solving: Difficulty sequencing        General Comments General comments (skin integrity, edema, etc.): pt was seen for progression of mobility and did not get a quality picture of safety with either cane or HHA    Exercises Other Exercises Other Exercises: Pt educated re: OT role, RW technique, falls prevention, energy conservation Other Exercises: Toileting, hand washing, self-feeding/drinking, bed mobility, sup<>sit, sit<>stand x2   Assessment/Plan    PT Assessment Patient needs continued PT services  PT Problem List Decreased strength;Decreased range of motion;Decreased activity tolerance;Decreased balance;Decreased mobility;Decreased coordination;Decreased knowledge of use of DME;Decreased cognition;Decreased safety awareness       PT Treatment Interventions DME instruction;Gait training;Stair training;Functional mobility training;Therapeutic activities;Therapeutic exercise;Balance training;Neuromuscular re-education;Patient/family education    PT Goals (Current goals can be found in the Care Plan section)  Acute Rehab PT Goals Patient Stated Goal: to return home PT Goal Formulation: With patient Time For Goal Achievement: 09/10/19 Potential to Achieve Goals: Good    Frequency Min 2X/week   Barriers to discharge Inaccessible home environment;Decreased caregiver support limited help and stairs to enter hose    Co-evaluation               AM-PAC PT "6 Clicks" Mobility  Outcome Measure Help needed turning from your back to your side while in a flat bed without  using bedrails?: A Little Help needed moving from lying on your back to sitting on the side of a flat bed without using bedrails?: A Little Help needed moving to and from a bed to a chair (including a wheelchair)?: A Little Help needed standing up from a chair using your arms (e.g., wheelchair or bedside chair)?: A Little Help needed to walk in hospital room?: A Lot Help needed climbing 3-5 steps with a railing? : Total 6 Click Score: 15    End of Session Equipment Utilized During Treatment: Gait belt Activity Tolerance: Other (comment)(quality of effort) Patient left: in bed;with call bell/phone within reach;with bed alarm set Nurse Communication: Mobility status PT Visit Diagnosis: Unsteadiness on feet (R26.81);Muscle weakness (generalized) (M62.81);Difficulty in walking, not elsewhere classified (R26.2);Hemiplegia and hemiparesis Hemiplegia - Right/Left: Right Hemiplegia - dominant/non-dominant: Dominant    Time: 1610-9604 PT Time Calculation (min) (ACUTE ONLY): 25 min   Charges:   PT Evaluation $PT Eval Moderate Complexity: 1 Mod PT Treatments $Gait Training: 8-22 mins       Ivar Drape 08/27/2019, 5:01 PM  Samul Dada, PT MS Acute Rehab Dept. Number: [facility] [id number] and [facility] [phone]

## 2019-08-27 NOTE — Hospital Course (Addendum)
Elizabeth Novak is a 86 y.o. 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  Associated nausea without vomiting or abdominal pain.  No fevers or chills.  Also reported mild dysuria and urinary frequency upon admission.  In the ED, stable vitals with borderline BP 108/62.  Labs showed no leukocytosis, hypokalemia with initial K < 2.0, hyponatremia with Na 120, normal renal function, UA negative for signs of infection, mildly elevated lipase 57, elevated total bilirubin 1.6 with normal transaminases.  Admitted to hospitalist service for further evaluation and management.

## 2019-08-27 NOTE — Progress Notes (Signed)
Patient has not had any BM's this shift. Order received from Dr Arbutus Ped to discontinue isolation and renew telemetry
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
## 2019-08-27 NOTE — ED Notes (Signed)
Assigned bed @ 1236, spoke with RN Ena Dawley.

## 2019-08-27 NOTE — ED Notes (Signed)
Floor called, [facility], pt on the way att

## 2019-08-27 NOTE — Evaluation (Signed)
Occupational Therapy Evaluation Patient Details Name: Elizabeth Novak MRN: [id number] DOB: 06/15/1923 Today's Date: 08/27/2019    History of Present Illness Elizabeth Novak is a 86 y.o. 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    Clinical Impression   Elizabeth Novak was seen for OT evaluation this date. Prior to hospital admission, pt was grossly SUPERVISION for mobility and ADLs c quad cane. Pt lives alone c HH aids 1st and 2nd shift to assist c driving and IADLs. Pt presents to acute OT demonstrating impaired ADL performance and functional mobility 2/2 decreased safety awareness, functional strength/balance deficits, and decreased activity tolerance. Pt currently requires CGA toileting at [facility] including perihygiene and clothing management in standing. CGA + RW hand washing standing sink side. SETUP self-feeding/drinking seated in bed. Pt initially required MIN A + quad cane sit<>stand at EOB decreasing to CGA + RW. Of note, pt adamant that RW will not work at home because there is not room and furniture cannot be moved because she uses furniture to walk. Nephew in room states he can adjust furniture if pt agreeable to RW use. Pt would benefit from skilled OT to address noted impairments and functional limitations (see below for any additional details) in order to maximize safety and independence while minimizing falls risk and caregiver burden. Upon hospital discharge, recommend HHOT to maximize pt safety and return to functional independence during meaningful occupations of daily life.     Follow Up Recommendations  Home health OT;Supervision/Assistance - 24 hour    Equipment Recommendations  None recommended by OT    Recommendations for Other Services       Precautions / Restrictions Precautions Precautions: None Restrictions Weight Bearing Restrictions: No      Mobility Bed Mobility Overal bed mobility: Needs Assistance Bed Mobility: Supine to Sit;Sit to Supine     Supine to sit: Supervision;HOB elevated Sit to supine: Supervision;HOB elevated   General bed mobility comments: SBA + HOB elevated   Transfers Overall transfer level: Needs assistance Equipment used: Quad cane;Rolling walker (2 wheeled) Transfers: Sit to/from Stand Sit to Stand: Min assist         General transfer comment: MIN A + quad cane decreasing to CGA c RW    Balance Overall balance assessment: Needs assistance Sitting-balance support: Feet supported;No upper extremity supported Sitting balance-Leahy Scale: Good     Standing balance support: No upper extremity supported;During functional activity Standing balance-Leahy Scale: Fair                             ADL either performed or assessed with clinical judgement   ADL Overall ADL's : Needs assistance/impaired                                       General ADL Comments: CGA toileting at [facility] including perihygiene and clothing management in standing. CGA + RW hand washing standing sink side. SETUP self-feeding/drinking seated in bed.      Vision Baseline Vision/History: Wears glasses Wears Glasses: At all times       Perception     Praxis      Pertinent  Vitals/Pain Pain Assessment: No/denies pain     Hand Dominance Left   Extremity/Trunk Assessment Upper Extremity Assessment Upper Extremity Assessment: Generalized weakness   Lower Extremity Assessment Lower Extremity Assessment: Generalized weakness       Communication Communication Communication: HOH   Cognition Arousal/Alertness: Awake/alert Behavior During Therapy: WFL for tasks assessed/performed Overall Cognitive Status:  Impaired/Different from baseline Area of Impairment: Memory;Safety/judgement;Problem solving                     Memory: Decreased short-term memory;Decreased recall of precautions   Safety/Judgement: Decreased awareness of safety;Decreased awareness of deficits   Problem Solving: Difficulty sequencing     General Comments       Exercises Exercises: Other exercises Other Exercises Other Exercises: Pt educated re: OT role, RW technique, falls prevention, energy conservation Other Exercises: Toileting, hand washing, self-feeding/drinking, bed mobility, sup<>sit, sit<>stand x2   Shoulder Instructions      Home Living Family/patient expects to be discharged to:: Private residence Living Arrangements: Alone Available Help at Discharge: Personal care attendant;Available PRN/intermittently(HH aids 1st and 2nd shift ) Type of Home: House Home Access: Stairs to enter [employer] of Steps: 2 Entrance Stairs-Rails: Right Home Layout: One level     Bathroom Shower/Tub: Chief Strategy Officer: Handicapped height     Home Equipment: Shower seat;Grab bars - tub/shower;Toilet riser;Cane - quad          Prior Functioning/Environment Level of Independence: Independent with assistive device(s)        Comments: Per nephew in room, pt uses quad cane and furniture walks in house. HH aids assist for driving and IADLs. Pt was a Engineer, production and enjoys cooking c aids         OT Problem List: Decreased strength;Impaired balance (sitting and/or standing);Decreased knowledge of use of DME or AE;Decreased safety awareness      OT Treatment/Interventions: Self-care/ADL training;Therapeutic exercise;Energy conservation;DME and/or AE instruction;Therapeutic activities;Patient/family education;Balance training    OT Goals(Current goals can be found in the care plan section) Acute Rehab OT Goals Patient Stated Goal: to return home OT Goal Formulation: With  patient/family Time For Goal Achievement: 09/10/19 Potential to Achieve Goals: Good ADL Goals Pt Will Perform Lower Body Dressing: with supervision;sit to/from stand(c LRAD PRN) Pt Will Transfer to Toilet: with supervision;ambulating;regular height toilet(c LRAD PRN) Additional ADL Goal #1: Pt will independently verbalize plan to implemetn x3 falls prevention strategies  OT Frequency: Min 1X/week   Barriers to D/C: Inaccessible home environment;Decreased caregiver support          Co-evaluation              AM-PAC OT "6 Clicks" Daily Activity     Outcome Measure Help from another person eating meals?: None Help from another person taking care of personal grooming?: None Help from another person toileting, which includes using toliet, bedpan, or urinal?: A Little Help from another person bathing (including washing, rinsing, drying)?: A Little Help from another person to put on and taking off regular upper body clothing?: None Help from another person to put on and taking off regular lower body clothing?: A Little 6 Click Score: 21   End of Session Equipment Utilized During Treatment: Rolling walker Nurse Communication: Mobility status  Activity Tolerance: Patient tolerated treatment well Patient left: in bed;with call bell/phone within reach;with nursing/sitter in room;with family/visitor present  OT Visit Diagnosis: Unsteadiness on feet (R26.81);Other abnormalities of gait and mobility (R26.89)  Time: 1610-9604 OT Time Calculation (min): 29 min Charges:  OT General Charges $OT Visit: 1 Visit OT Evaluation $OT Eval Low Complexity: 1 Low OT Treatments $Self Care/Home Management : 23-37 mins  Kathie Dike, M.S. OTR/L  08/27/19, 4:23 PM

## 2019-08-27 NOTE — Progress Notes (Addendum)
PROGRESS NOTE    Elizabeth Novak   MVH:[id number]  DOB: Aug 15, 1923  PCP: Danella Penton, MD    DOA: 08/26/2019 LOS: 1   Brief Narrative   Elizabeth Novak is a 86 y.o. 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  Associated nausea without vomiting or abdominal pain.  No fevers or chills.  Also reported mild dysuria and urinary frequency upon admission.  In the ED, stable vitals with borderline BP 108/62.  Labs showed no leukocytosis, hypokalemia with initial K < 2.0, hyponatremia with Na 120, normal renal function, UA negative for signs of infection, mildly elevated lipase 57, elevated total bilirubin 1.6 with normal transaminases.  Admitted to hospitalist service for further evaluation and management.      Assessment & Plan   Principal Problem:   Diarrhea Active Problems:   Hypokalemia   Hyponatremia   AKI (acute kidney injury) (HCC)   Atrial fibrillation (HCC)   HTN (hypertension)   Diabetes mellitus without complication (HCC)   Chronic diastolic CHF (congestive heart failure) (HCC)   GERD without esophagitis   Anxiety with depression   History of CVA (cerebrovascular accident)   HLD (hyperlipidemia)   Warm autoimmune hemolytic anemia   Diarrhea - presented with few day history of watery, non-bloody diarrhea without abdominal pain.  C diff and GI pathogen panel are pending stool sample.  Continue gentle IV hydration.  Monitor volume status.  Zofran PRN nausea.  Monitor CBC, BMP, Mg.  Hypokalemia - present on admission with K < 2.0. Replaced. Monitor BMP and replace as needed.  Target K>4.0.  Hyponatremia - present on admission.  Hypotonic hypovolemic, acute.  Resolved  with IV hydration.  Monitor BMP.  AKI (acute kidney injury) - not present on admission, but most likely secondary to pre-renal azotemia in setting of diarrhea and possibly obstructive as well given she did have some urinary retention.  Cr 0.95>>0.82>>1.40.    Urinary Retention - acute, present on admission.  Required in/out cath for retention of ~525 cc.  Atrial fibrillation - rate controlled.  Continue Eliquis and metoprolol.  Hypertension - continue home metoprolol.  Hydralazine PRN.  Diabetes mellitus without complication - last A1c 8.5%, poorly controlled.  Takes Amaryl at home, held.  Sliding scale Novolog and CBG's AC/HS.  Chronic diastolic CHF - euvolemic, compensated.  Last echo on file from March 2017 showed EF 55-60%.  Holding torsemide and metolazone for now due to hyponatremia and hypokalemia.  GERD without esophagitis - cont home daily PPI and evening Pepcid.  Anxiety with depression - continue home mirtazapine.  History of CVA - on Eliquis for A-fib, and statin - continued.  Hyperlipidemia - continue statin.  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  [facility]  Recent follow up note from 07/25/19 mentions resolution of hemolysis, Cellcept continued with plan to taper off in 3 months.  Patient BMI: Body mass index is 16.17 kg/m.   DVT prophylaxis: on Eliquis  Diet:  Diet Orders (From admission, onward)    Start     Ordered   08/26/19 2031  Diet heart healthy/carb modified Room service appropriate? Yes; Fluid consistency: Thin  Diet effective now    Question Answer Comment  Diet-HS Snack? Nothing  Room service appropriate? Yes   Fluid consistency: Thin      08/26/19 2030            Code Status: Full Code    Subjective 08/27/19    Patient seen at bedside this AM.  No acute events reported.  She says she feels better.  Has not had another BM yet since admission.  Is able to void today.  Says she does not like  wearing depends and would prefer to have it removed.   Disposition Plan & Communication   Status is: Inpatient  Remains inpatient appropriate because:Persistent severe electrolyte disturbances requiring close monitoring for at least another 24 hours for stability.   Dispo: The patient is from: Home              Anticipated d/c is to: Home              Anticipated d/c date is: 1 day              Patient currently is not medically stable to d/c.   Family Communication: attempted to call grandson, [name], unsuccessful.  Will attempt call again later.     Consults, Procedures, Significant Events   Consultants:   None  Procedures:   None  Antimicrobials:   None    Objective   Vitals:   08/27/19 0206 08/27/19 0209 08/27/19 0620 08/27/19 1156  BP:  108/71 (!) 107/57 120/61  Pulse:  61 60 (!) 59  Resp:  18 18 18   Temp:  97.8 F (36.6 C) 98.7 F (37.1 C) 97.6 F (36.4 C)  TempSrc:  Oral Oral Oral  SpO2:  99% 94% 99%  Weight: 42.7 kg     Height: 5\' 4"  (1.626 m)       Intake/Output Summary (Last 24 hours) at 08/27/2019 1317 Last data filed at 08/27/2019 1138 Gross per 24 hour  Intake 1811.5 ml  Output 1075 ml  Net 736.5 ml   Filed Weights   08/26/19 1244 08/27/19 0206  Weight: 40.8 kg 42.7 kg    Physical Exam:  General exam: awake, alert, no acute distress, frail HEENT: moist mucus membranes, hearing grossly normal  Respiratory system: CTAB, no wheezes, rales or rhonchi, normal respiratory effort. Cardiovascular system: normal S1/S2, RRR, no JVD, murmurs, rubs, gallops, no pedal edema.   Gastrointestinal system: soft, NT, ND, no HSM felt, hyperactive bowel sounds. Central nervous system: A&O x4. no gross focal neurologic deficits, normal speech Extremities: moves all, no cyanosis, normal tone Skin: dry, intact, normal temperature Psychiatry: normal mood, congruent affect, judgement and insight appear normal  Labs   Data Reviewed: I have personally  reviewed following labs and imaging studies  CBC: Recent Labs  Lab 08/26/19 1255 08/27/19 0534  WBC 7.1 5.6  HGB 13.5 11.6*  HCT 38.5 33.2*  MCV 78.7* 79.6*  PLT 251 237   Basic Metabolic Panel: Recent Labs  Lab 08/26/19 1354 08/26/19 1534 08/27/19 0043 08/27/19 0318 08/27/19 1223  NA 120*  --  128* 135 130*  K <2.0*  --  2.7* 4.4 2.9*  CL 71*  --  85* 103 89*  CO2 29  --  30 23 31   GLUCOSE 243*  --  132* 132* 153*  BUN 47*  --  38* 20 34*  CREATININE 0.95  --  0.82 1.40* 0.88  CALCIUM 8.9  --  8.7* 8.4* 8.9  MG  --  2.1  --   --   --    GFR:  Estimated Creatinine Clearance: 26.9 mL/min (by C-G formula based on SCr of 0.88 mg/dL). Liver Function Tests: Recent Labs  Lab 08/26/19 1354  AST 23  ALT 18  ALKPHOS 84  BILITOT 1.6*  PROT 6.7  ALBUMIN 4.2   Recent Labs  Lab 08/26/19 1354  LIPASE 57*   No results for input(s): AMMONIA in the last 168 hours. Coagulation Profile: No results for input(s): INR, PROTIME in the last 168 hours. Cardiac Enzymes: No results for input(s): CKTOTAL, CKMB, CKMBINDEX, TROPONINI in the last 168 hours. BNP (last 3 results) No results for input(s): PROBNP in the last 8760 hours. HbA1C: No results for input(s): HGBA1C in the last 72 hours. CBG: Recent Labs  Lab 08/26/19 1923 08/26/19 2244 08/27/19 0747 08/27/19 1157  GLUCAP 174* 196* 304* 178*   Lipid Profile: No results for input(s): CHOL, HDL, LDLCALC, TRIG, CHOLHDL, LDLDIRECT in the last 72 hours. Thyroid Function Tests: Recent Labs    08/27/19 0318  TSH 2.787   Anemia Panel: No results for input(s): VITAMINB12, FOLATE, FERRITIN, TIBC, IRON, RETICCTPCT in the last 72 hours. Sepsis Labs: No results for input(s): PROCALCITON, LATICACIDVEN in the last 168 hours.  Recent Results (from the past 240 hour(s))  SARS Coronavirus 2 by RT PCR (hospital order, performed in [facility] hospital lab) Nasopharyngeal Nasopharyngeal Swab     Status: None   Collection Time:  08/26/19  3:34 PM   Specimen: Nasopharyngeal Swab  Result Value Ref Range Status   SARS Coronavirus 2 NEGATIVE NEGATIVE Final    Comment: (NOTE) SARS-CoV-2 target nucleic acids are NOT DETECTED. The SARS-CoV-2 RNA is generally detectable in upper and lower respiratory specimens during the acute phase of infection. The lowest concentration of SARS-CoV-2 viral copies this assay can detect is 250 copies / mL. A negative result does not preclude SARS-CoV-2 infection and should not be used as the sole basis for treatment or other patient management decisions.  A negative result may occur with improper specimen collection / handling, submission of specimen other than nasopharyngeal swab, presence of viral mutation(s) within the areas targeted by this assay, and inadequate number of viral copies (<250 copies / mL). A negative result must be combined with clinical observations, patient history, and epidemiological information. Fact Sheet for Patients:   BoilerBrush.com.cy Fact Sheet for Healthcare Providers: https://pope.com/ This test is not yet approved or cleared  by the [place] FDA and has been authorized for detection and/or diagnosis of SARS-CoV-2 by FDA under an Emergency Use Authorization (EUA).  This EUA will remain in effect (meaning this test can be used) for the duration of the COVID-19 declaration under Section 564(b)(1) of the Act, 21 U.S.C. section 360bbb-3(b)(1), unless the authorization is terminated or revoked sooner. Performed at [facility], [address], [place], [place] [address]       Imaging Studies   No results found.   Medications   Scheduled Meds: . apixaban  2.5 mg Oral BID  . atorvastatin  20 mg Oral QHS  . [START ON 08/28/2019] digoxin  0.125 mg Oral QODAY  . famotidine  20 mg Oral QHS  . insulin aspart  0-5 Units Subcutaneous QHS  . insulin aspart  0-9 Units Subcutaneous TID WC  .  isosorbide mononitrate  30 mg Oral Daily  . metoprolol succinate  50 mg Oral Daily  . mirtazapine  15 mg Oral QHS  . multivitamin-lutein  2 capsule Oral BID  . mycophenolate  500 mg Oral BID  . pantoprazole  40  mg Oral Daily  . potassium chloride  40 mEq Oral Q4H  . sodium chloride flush  3 mL Intravenous Once   Continuous Infusions: . sodium chloride 75 mL/hr at 08/27/19 0415       LOS: 1 day    Time spent: 30 minutes    Pennie Banter, DO [facility]  08/27/2019, 1:17 PM    If 7PM-7AM, please contact night-coverage. How to contact the [facility] Attending or Consulting provider 7A - 7P or covering provider during after hours 7P -7A, for this patient?    1. Check the care team in [facility] and look for a) attending/consulting TRH provider listed and b) the [facility] team listed 2. Log into [email or web address] and use [place]'s universal password to access. If you do not have the password, please contact the hospital operator. 3. Locate the [facility] provider you are looking for under [facility] and page to a number that you can be directly reached. 4. If you still have difficulty reaching the provider, please page the [facility] (Director on Call) for the Hospitalists listed on amion for assistance.

## 2019-08-28 ENCOUNTER — Other Ambulatory Visit: Payer: Self-pay

## 2019-08-28 DIAGNOSIS — E119 Type 2 diabetes mellitus without complications: Secondary | ICD-10-CM | POA: Insufficient documentation

## 2019-08-28 DIAGNOSIS — K859 Acute pancreatitis without necrosis or infection, unspecified: Secondary | ICD-10-CM | POA: Insufficient documentation

## 2019-08-28 DIAGNOSIS — I110 Hypertensive heart disease with heart failure: Secondary | ICD-10-CM | POA: Insufficient documentation

## 2019-08-28 DIAGNOSIS — Z79899 Other long term (current) drug therapy: Secondary | ICD-10-CM | POA: Insufficient documentation

## 2019-08-28 DIAGNOSIS — I509 Heart failure, unspecified: Secondary | ICD-10-CM | POA: Insufficient documentation

## 2019-08-28 LAB — COMPREHENSIVE METABOLIC PANEL
ALT: 17 U/L (ref 0–44)
AST: 25 U/L (ref 15–41)
Albumin: 3.7 g/dL (ref 3.5–5.0)
Alkaline Phosphatase: 71 U/L (ref 38–126)
Anion gap: 8 (ref 5–15)
BUN: 27 mg/dL — ABNORMAL HIGH (ref 8–23)
CO2: 30 mmol/L (ref 22–32)
Calcium: 9.5 mg/dL (ref 8.9–10.3)
Chloride: 96 mmol/L — ABNORMAL LOW (ref 98–111)
Creatinine, Ser: 0.86 mg/dL (ref 0.44–1.00)
GFR calc Af Amer: >60 mL/min (ref >60)
GFR calc non Af Amer: 58 mL/min — ABNORMAL LOW (ref >60)
Glucose, Bld: 251 mg/dL — ABNORMAL HIGH (ref 70–99)
Potassium: 4.2 mmol/L (ref 3.5–5.1)
Sodium: 134 mmol/L — ABNORMAL LOW (ref 135–145)
Total Bilirubin: 0.8 mg/dL (ref 0.3–1.2)
Total Protein: 6.1 g/dL — ABNORMAL LOW (ref 6.5–8.1)

## 2019-08-28 LAB — CBC
HCT: 36.6 % (ref 36.0–46.0)
HCT: 36.4 % (ref 36.0–46.0)
Hemoglobin: 12.1 g/dL (ref 12.0–15.0)
Hemoglobin: 12.2 g/dL (ref 12.0–15.0)
MCH: 27.6 pg (ref 26.0–34.0)
MCH: 27.8 pg (ref 26.0–34.0)
MCHC: 33.1 g/dL (ref 30.0–36.0)
MCHC: 33.5 g/dL (ref 30.0–36.0)
MCV: 83.4 fL (ref 80.0–100.0)
MCV: 82.9 fL (ref 80.0–100.0)
Platelets: 243 10*3/uL (ref 150–400)
Platelets: 279 10*3/uL (ref 150–400)
RBC: 4.39 MIL/uL (ref 3.87–5.11)
RBC: 4.39 MIL/uL (ref 3.87–5.11)
RDW: 13.3 % (ref 11.5–15.5)
RDW: 13.4 % (ref 11.5–15.5)
WBC: 5.1 10*3/uL (ref 4.0–10.5)
WBC: 6.8 10*3/uL (ref 4.0–10.5)
nRBC: 0 % (ref 0.0–0.2)
nRBC: 0 % (ref 0.0–0.2)

## 2019-08-28 LAB — BASIC METABOLIC PANEL
Anion gap: 9 (ref 5–15)
BUN: 29 mg/dL — ABNORMAL HIGH (ref 8–23)
CO2: 30 mmol/L (ref 22–32)
Calcium: 9.1 mg/dL (ref 8.9–10.3)
Chloride: 97 mmol/L — ABNORMAL LOW (ref 98–111)
Creatinine, Ser: 0.77 mg/dL (ref 0.44–1.00)
GFR calc Af Amer: >60 mL/min (ref >60)
GFR calc non Af Amer: >60 mL/min (ref >60)
Glucose, Bld: 127 mg/dL — ABNORMAL HIGH (ref 70–99)
Potassium: 3.5 mmol/L (ref 3.5–5.1)
Sodium: 136 mmol/L (ref 135–145)

## 2019-08-28 LAB — GLUCOSE, CAPILLARY
Glucose-Capillary: 140 mg/dL — ABNORMAL HIGH (ref 70–99)
Glucose-Capillary: 269 mg/dL — ABNORMAL HIGH (ref 70–99)

## 2019-08-28 LAB — MAGNESIUM: Magnesium: 2.5 mg/dL — ABNORMAL HIGH (ref 1.7–2.4)

## 2019-08-28 MED ORDER — POTASSIUM CHLORIDE CRYS ER 20 MEQ PO TBCR
40.0000 meq | EXTENDED_RELEASE_TABLET | Freq: Once | ORAL | Status: AC
  Administered 2019-08-28: 40 meq via ORAL
  Filled 2019-08-28: qty 2

## 2019-08-28 NOTE — TOC Transition Note (Signed)
Transition of Care [facility]) - CM/SW Discharge Note   Patient Details  Name: Elizabeth Novak MRN: [id number] Date of Birth: 01-19-1924  Transition of Care [facility]) CM/SW Contact:  Beverly Sessions, RN Phone Number: 08/28/2019, 11:57 AM   Clinical Narrative:    Patient to discharge home today Received call from niece Gaylenne.  She confirms that nephew Ruthann Cancer can pick her up at discharge.  Notified MD that that nephew requesting call.   Patient to discharge home with home health services.  Per patient and niece they do not have a preference of home health agency.  Referral made to [facility] with [place]. MD has ordered rolling walker for discharge.  Per patient she confirms she has a Rolling walker in the home    Final next level of care: [place] Barriers to Discharge: No Barriers Identified   Patient Goals and CMS Choice Patient states their goals for this hospitalization and ongoing recovery are:: "I want to feel better, I just feel sick".   Choice offered to / list presented to : NA  Discharge Placement                  Name of family member notified: Gaylenne Patient and family notified of of transfer: 08/28/19  Discharge Plan and Services In-house Referral: NA Discharge Planning Services: NA Post Acute Care Choice: NA          DME Arranged: N/A DME Agency: NA       HH Arranged: PT, OT [place] Agency: [place] ([place]) Date [place]: 08/28/19 Time [place]: 1156 Representative spoke with at [place]: [place] ([place]) Interventions     Readmission Risk Interventions No flowsheet data found.

## 2019-08-28 NOTE — Progress Notes (Signed)
Discharge instructions given to the caregiver. The patient insisted that she leave in only a robe and she would not put on her clean clothes that were brought to her by her caregiver. Patient sent out via wheelchair to the caregivers car.

## 2019-08-28 NOTE — Progress Notes (Signed)
Order received from Dr Arbutus Ped to discontinue telemetry. Okay given to leave IV out since the patient pulled hers out last night and refuses for staff to restart IV

## 2019-08-28 NOTE — Discharge Summary (Signed)
Physician Discharge Summary  Wanakee Wahls [id number] DOB: 08-24-1923 DOA: 08/26/2019  PCP: Danella Penton, MD  Admit date: 08/26/2019 Discharge date: 08/28/2019  Admitted From: home Disposition:  home  Recommendations for Outpatient Follow-up:  1. Follow up with PCP in 1-2 weeks 2. Please obtain BMP/CBC in one week 3. Please follow up on patient's need for diuretics.  Her metolazone and torsemide were held and stopped on discharge because she presented with severe hypokalemia and hyponatremia.  If these are resumed, recommend very close monitoring of her electrolytes.  Home Health: Yes, resume prior services Equipment/Devices: None   Discharge Condition: Stable  CODE STATUS: Full  Diet recommendation: Heart Healthy / Carb Modified     Discharge Diagnoses: Principal Problem:   Diarrhea Active Problems:   Hypokalemia   Hyponatremia   AKI (acute kidney injury) (HCC)   Atrial fibrillation (HCC)   HTN (hypertension)   Diabetes mellitus without complication (HCC)   Chronic diastolic CHF (congestive heart failure) (HCC)   GERD without esophagitis   Anxiety with depression   History of CVA (cerebrovascular accident)   HLD (hyperlipidemia)   Warm autoimmune hemolytic anemia    Summary of HPI and Hospital Course:  Elizabeth Novak is a 86 y.o. 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  Associated nausea without vomiting or abdominal pain.  No fevers or chills.  Also reported mild dysuria and urinary frequency upon admission.  In the ED, stable vitals with borderline BP 108/62.  Labs showed no leukocytosis, hypokalemia with initial K < 2.0, hyponatremia with Na 120, normal  renal function, UA negative for signs of infection, mildly elevated lipase 57, elevated total bilirubin 1.6 with normal transaminases.  Admitted to hospitalist service for further evaluation and management.      Diarrhea - presented with few day history of watery, non-bloody diarrhea without abdominal pain.  C diff and GI pathogen panel were not collected as patient did not have a true BM in over 24 hours after admission.  Had large BM morning of discharge, not diarrhea.  PCP follow up.  Hypokalemia - present on admission with K < 2.0. Replaced. Monitor BMP and replace as needed.  Target K>4.0.  Stop diuretics.  Recommend PRN diuretic use if swelling with very close lab monitoring  Hyponatremia - present on admission.  Hypotonic hypovolemic, acute.  Resolved with IV hydration.  Monitor BMP.  Stopped diuretics.  Recommend PRN diuretic use if swelling with very close lab monitoring.  AKI (acute kidney injury) - not present on admission, but most likely secondary to pre-renal azotemia in setting of diarrhea and possibly obstructive as well given she did have some urinary retention.  Cr 0.95>>0.82>>1.40....0.86 on discharge.    Urinary Retention - acute, present on admission.  Required in/out cath for retention of ~525 cc.  Resolved, patient voiding prior to d/c.  Atrial fibrillation - rate controlled.  Continue Eliquis and metoprolol.  Hypertension - continue home metoprolol.  Hydralazine PRN.  Diabetes mellitus without complication - last A1c 8.5%, poorly controlled.  Takes Amaryl at home, held.  Treated with sliding scale Novolog and CBG's AC/HS.  Amaryl resumed on d/c.  Chronic diastolic CHF - euvolemic, compensated.  Last echo on file from March 2017 showed EF 55-60%.  Holding torsemide  and metolazone for now due to hyponatremia and hypokalemia.  Diuretics stopped on d/c due to severity of her electrolyte abnormalities and no edema or other signs of volume overload during  admission.  GERD without esophagitis - cont home daily PPI and evening Pepcid.  Anxiety with depression - continue home mirtazapine.  History of CVA - on Eliquis for A-fib, and statin - continued.  Hyperlipidemia - continue statin.  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  [facility]  Recent follow up note from 07/25/19 mentions resolution of hemolysis, Cellcept continued with plan to taper off in 3 months.   DVT prophylaxis: on Eliquis   Discharge Instructions   Discharge Instructions    (HEART FAILURE PATIENTS) Call MD:  Anytime you have any of the following symptoms: 1) 3 pound weight gain in 24 hours or 5 pounds in 1 week 2) shortness of breath, with or without a dry hacking cough 3) swelling in the hands, feet or stomach 4) if you have to sleep on extra pillows at night in order to breathe.   Complete by: As directed    Call MD for:  extreme fatigue   Complete by: As directed    Call MD for:  persistant dizziness or light-headedness   Complete by: As directed    Call MD for:  persistant nausea and vomiting   Complete by: As directed    Call MD for:  temperature >100.4   Complete by: As directed    Diet - low sodium heart healthy   Complete by: As directed    Discharge instructions   Complete by: As directed    I stopped your fluid pills (metolazone and torsemide) because these can both cause you to lose sodium and potassium, and these were both VERY low when you came in to the hospital.  Please talk to your primary care doctor or cardiologist about whether these should be restarted.  I recommend very close monitoring of your labs if diuretics (fluid pills) are started again.  Please see primary care doctor for repeat labs by the end of this week.   Increase activity slowly   Complete by: As directed      Allergies as of 08/28/2019      Reactions   Codeine Nausea Only   Disopyramide    Other reaction(s): Unknown    Ibuprofen Diarrhea   Iodine    blisters   Metformin And Related    Norpace [disopyramide Phosphate]    Quinidine    Other reaction(s): Unknown   Terfenadine    Other reaction(s): Unknown   Topiramate    Other reaction(s): Other (See Comments) Hair loss   Verapamil    Other reaction(s): Unknown   Dexamethasone Sodium Phosphate Palpitations      Medication List    STOP taking these medications   metolazone 2.5 MG tablet Commonly known as: ZAROXOLYN   torsemide 20 MG tablet Commonly known as: DEMADEX     TAKE these medications   apixaban 2.5 MG Tabs tablet Commonly known as: Eliquis Take 1 tablet (2.5 mg total) by mouth 2 (two) times daily.   atorvastatin 20 MG tablet Commonly known as: LIPITOR Take 1 tablet (20 mg total) by mouth at bedtime.   carboxymethylcellulose 0.5 % Soln Commonly known as: REFRESH PLUS 1 drop 3 (three) times daily as needed.   digoxin 0.125 MG tablet Commonly known as: LANOXIN Take 1 tablet (0.125 mg total) by mouth every other day.  CHF- * Hold for HR < 60   famotidine 20 MG tablet Commonly known as: PEPCID SMARTSIG:1 Tablet(s) By Mouth Every Evening   glimepiride 2 MG tablet Commonly known as: AMARYL Take 1 tablet (2 mg total) by mouth 2 (two) times daily.   isosorbide mononitrate 30 MG 24 hr tablet Commonly known as: IMDUR Take 1 tablet by mouth daily.   metoprolol succinate 50 MG 24 hr tablet Commonly known as: TOPROL-XL Take 1 tablet (50 mg total) by mouth daily.   mirtazapine 15 MG tablet Commonly known as: REMERON Take 15 mg by mouth at bedtime.   mycophenolate 500 MG tablet Commonly known as: CELLCEPT Take 1 tablet (500 mg total) by mouth 2 (two) times daily.   nitroGLYCERIN 0.4 MG SL tablet Commonly known as: NITROSTAT DISSOLVE ONE TABLET UNDER THE TONGUE AS NEEDED FOR CHEST PAIN. MAY REPEAT AS INDICATED BY YOUR DOCTOR.   omeprazole 20 MG capsule Commonly known as: PRILOSEC Take 1 capsule by mouth 1 day or 1  dose.   potassium chloride 10 MEQ tablet Commonly known as: KLOR-CON Take 1 tablet by mouth in the morning and at bedtime.   PRESERVISION AREDS 2 PO Take 1 tablet by mouth 2 (two) times daily.      Follow-up Information    Danella Penton, MD Follow up in 3 day(s).   Specialty: Internal Medicine Why: Needs follow up BMP by end of the week Contact information: [address] [facility] [place] [address] [phone]          Allergies  Allergen Reactions  . Codeine Nausea Only  . Disopyramide     Other reaction(s): Unknown  . Ibuprofen Diarrhea  . Iodine     blisters  . Metformin And Related   . Norpace [Disopyramide Phosphate]   . Quinidine     Other reaction(s): Unknown  . Terfenadine     Other reaction(s): Unknown  . Topiramate     Other reaction(s): Other (See Comments) Hair loss  . Verapamil     Other reaction(s): Unknown  . Dexamethasone Sodium Phosphate Palpitations   Procedures/Studies:  No results found.    Subjective: Patient seen at bedside, sitting up edge of bed.  She denies acute complaints.  Had a BM this AM, normal, not diarrhea.  She is concerned about who will drive her home.     Discharge Exam: Vitals:   08/28/19 0501 08/28/19 0758  BP: 113/60 118/75  Pulse: (!) 58 63  Resp: 16   Temp: (!) 97.5 F (36.4 C)   SpO2: 98%    Vitals:   08/27/19 2047 08/28/19 0404 08/28/19 0501 08/28/19 0758  BP: 118/61  113/60 118/75  Pulse: 60  (!) 58 63  Resp: 16  16   Temp: 98.6 F (37 C)  (!) 97.5 F (36.4 C)   TempSrc: Oral  Oral   SpO2: 98%  98%   Weight:  42.4 kg    Height:        General: Pt is alert, awake, not in acute distress Cardiovascular: RRR, S1/S2 +, no rubs, no gallops Respiratory: CTA bilaterally, no wheezing, no rhonchi Abdominal: Soft, NT, ND, bowel sounds + Extremities: no edema, no cyanosis    The results of significant diagnostics from this hospitalization (including imaging,  microbiology, ancillary and laboratory) are listed below for reference.     Microbiology: Recent Results (from the past 240 hour(s))  SARS Coronavirus 2 by RT PCR (hospital order, performed in [facility] hospital lab) Nasopharyngeal Nasopharyngeal Swab     Status: None   Collection Time: 08/26/19  3:34 PM   Specimen: Nasopharyngeal Swab  Result Value Ref Range Status   SARS Coronavirus 2 NEGATIVE NEGATIVE Final    Comment: (NOTE) SARS-CoV-2 target nucleic acids are NOT DETECTED. The SARS-CoV-2 RNA is generally detectable in upper and lower respiratory specimens during the acute phase of infection. The lowest concentration of SARS-CoV-2 viral copies this assay can detect is 250 copies / mL. A negative result does not preclude SARS-CoV-2 infection and should not be used as the sole basis for treatment or other patient management decisions.  A negative result may occur with improper specimen collection / handling, submission of specimen other than nasopharyngeal swab, presence of viral mutation(s) within the areas targeted by this assay, and inadequate number of viral copies (<250 copies / mL). A negative result must be combined with clinical observations, patient history, and epidemiological information. Fact Sheet for Patients:   BoilerBrush.com.cy Fact Sheet for Healthcare Providers: https://pope.com/ This test is not yet approved or cleared  by the [place] FDA and has been authorized for detection and/or diagnosis of SARS-CoV-2 by FDA under an Emergency Use Authorization (EUA).  This EUA will remain in effect (meaning this test can be used) for the duration of the COVID-19 declaration under Section 564(b)(1) of the Act, 21 U.S.C. section 360bbb-3(b)(1), unless the authorization is terminated or revoked sooner. Performed at [facility], [address] [place], [place] [address]      Labs: BNP (last 3  results) Recent Labs    07/02/19 0026 08/26/19 1622  BNP 246.0* 143.0*   Basic Metabolic Panel: Recent Labs  Lab 08/26/19 1354 08/26/19 1534 08/27/19 0043 08/27/19 0318 08/27/19 1223 08/27/19 2047 08/28/19 0427  NA   < >  --  128* 135 130* 130* 136  K   < >  --  2.7* 4.4 2.9* 3.6 3.5  CL   < >  --  85* 103 89* 89* 97*  CO2   < >  --  30 23 31 29 30   GLUCOSE   < >  --  132* 132* 153* 234* 127*  BUN   < >  --  38* 20 34* 31* 29*  CREATININE   < >  --  0.82 1.40* 0.88 1.13* 0.77  CALCIUM   < >  --  8.7* 8.4* 8.9 8.9 9.1  MG  --  2.1  --   --   --   --  2.5*   < > = values in this interval not displayed.   Liver Function Tests: Recent Labs  Lab 08/26/19 1354  AST 23  ALT 18  ALKPHOS 84  BILITOT 1.6*  PROT 6.7  ALBUMIN 4.2   Recent Labs  Lab 08/26/19 1354  LIPASE 57*   No results for input(s): AMMONIA in the last 168 hours. CBC: Recent Labs  Lab 08/26/19 1255 08/27/19 0534 08/28/19 0427  WBC 7.1 5.6 5.1  HGB 13.5 11.6* 12.1  HCT 38.5 33.2* 36.6  MCV 78.7* 79.6* 83.4  PLT 251 237 243   Cardiac Enzymes: No results for input(s): CKTOTAL, CKMB, CKMBINDEX, TROPONINI in the last 168 hours. BNP: Invalid input(s): POCBNP CBG: Recent Labs  Lab 08/27/19 0747 08/27/19 1157 08/27/19 1644 08/27/19 2227 08/28/19 0744  GLUCAP 304* 178* 146* 215* 140*   D-Dimer No results for input(s): DDIMER in the last 72 hours. Hgb A1c No results for input(s): HGBA1C in the  last 72 hours. Lipid Profile No results for input(s): CHOL, HDL, LDLCALC, TRIG, CHOLHDL, LDLDIRECT in the last 72 hours. Thyroid function studies Recent Labs    08/27/19 0318  TSH 2.787   Anemia work up No results for input(s): VITAMINB12, FOLATE, FERRITIN, TIBC, IRON, RETICCTPCT in the last 72 hours. Urinalysis    Component Value Date/Time   COLORURINE STRAW (A) 08/26/2019 1815   APPEARANCEUR CLEAR (A) 08/26/2019 1815   LABSPEC 1.005 08/26/2019 1815   PHURINE 7.0 08/26/2019 1815   GLUCOSEU  NEGATIVE 08/26/2019 1815   HGBUR NEGATIVE 08/26/2019 1815   BILIRUBINUR NEGATIVE 08/26/2019 1815   KETONESUR NEGATIVE 08/26/2019 1815   PROTEINUR NEGATIVE 08/26/2019 1815   NITRITE NEGATIVE 08/26/2019 1815   LEUKOCYTESUR NEGATIVE 08/26/2019 1815   Sepsis Labs Invalid input(s): PROCALCITONIN,  WBC,  LACTICIDVEN Microbiology Recent Results (from the past 240 hour(s))  SARS Coronavirus 2 by RT PCR (hospital order, performed in [facility] hospital lab) Nasopharyngeal Nasopharyngeal Swab     Status: None   Collection Time: 08/26/19  3:34 PM   Specimen: Nasopharyngeal Swab  Result Value Ref Range Status   SARS Coronavirus 2 NEGATIVE NEGATIVE Final    Comment: (NOTE) SARS-CoV-2 target nucleic acids are NOT DETECTED. The SARS-CoV-2 RNA is generally detectable in upper and lower respiratory specimens during the acute phase of infection. The lowest concentration of SARS-CoV-2 viral copies this assay can detect is 250 copies / mL. A negative result does not preclude SARS-CoV-2 infection and should not be used as the sole basis for treatment or other patient management decisions.  A negative result may occur with improper specimen collection / handling, submission of specimen other than nasopharyngeal swab, presence of viral mutation(s) within the areas targeted by this assay, and inadequate number of viral copies (<250 copies / mL). A negative result must be combined with clinical observations, patient history, and epidemiological information. Fact Sheet for Patients:   BoilerBrush.com.cy Fact Sheet for Healthcare Providers: https://pope.com/ This test is not yet approved or cleared  by the [place] FDA and has been authorized for detection and/or diagnosis of SARS-CoV-2 by FDA under an Emergency Use Authorization (EUA).  This EUA will remain in effect (meaning this test can be used) for the duration of the COVID-19 declaration under  Section 564(b)(1) of the Act, 21 U.S.C. section 360bbb-3(b)(1), unless the authorization is terminated or revoked sooner. Performed at [facility], [address] [place], [place] [address]      Time coordinating discharge: Over 30 minutes  SIGNED:   Pennie Banter, DO [facility] 08/28/2019, 10:02 AM   If 7PM-7AM, please contact night-coverage [email or web address]

## 2019-08-28 NOTE — ED Triage Notes (Addendum)
Pt arrives tio ED via [facility] from home with c/o AMS and a "possible UTI". EMS reports pt was just d/c'd today from [facility] following a 3-day stay for a UTI. EMS reports pt was "talking out of her head and hallucinating". Pt denies any c/o or reports of pain at this time. Pt is alert, but (per EMS) sometimes "pleasantly confused". Pt is in NAD; RR even, regular, and unlabored.

## 2019-08-28 NOTE — Progress Notes (Signed)
Pt found up in room on bedside commode without calling staff, telemetry off and iv pulled out. States she has been trying to find her cell phone which was in the bed with her. Assisted back to bed cleaned up and telemetry reapplied. She refuses for IV to be restarted at this time will attempt at a later time.

## 2019-08-29 ENCOUNTER — Emergency Department: Payer: Medicare Other

## 2019-08-29 ENCOUNTER — Emergency Department
Admission: EM | Admit: 2019-08-29 | Discharge: 2019-08-29 | Disposition: A | Discharge status: Home / Self Care | Payer: Medicare Other | Attending: Emergency Medicine | Admitting: Emergency Medicine

## 2019-08-29 DIAGNOSIS — R4182 Altered mental status, unspecified: Secondary | ICD-10-CM

## 2019-08-29 LAB — LIPASE, BLOOD: Lipase: 84 U/L — ABNORMAL HIGH (ref 11–51)

## 2019-08-29 LAB — URINALYSIS, COMPLETE (UACMP) WITH MICROSCOPIC
Bacteria, UA: NONE SEEN
Bilirubin Urine: NEGATIVE
Glucose, UA: 50 mg/dL — AB
Hgb urine dipstick: NEGATIVE
Ketones, ur: NEGATIVE mg/dL
Leukocytes,Ua: NEGATIVE
Nitrite: NEGATIVE
Protein, ur: NEGATIVE mg/dL
Specific Gravity, Urine: 1.012 (ref 1.005–1.030)
Squamous Epithelial / HPF: NONE SEEN (ref 0–5)
pH: 5 (ref 5.0–8.0)

## 2019-08-29 LAB — TROPONIN I (HIGH SENSITIVITY)
Troponin I (High Sensitivity): 11 ng/L (ref <18)
Troponin I (High Sensitivity): 10 ng/L (ref <18)

## 2019-08-29 LAB — LACTIC ACID, PLASMA: Lactic Acid, Venous: 1.2 mmol/L (ref 0.5–1.9)

## 2019-08-29 LAB — AMMONIA: Ammonia: 11 umol/L (ref 9–35)

## 2019-08-29 NOTE — ED Notes (Signed)
Lab at bedside for blood draw. Explained to pt need for repeat bloodwork to make sure troponin and lactic have not increased. Pt caregiver at bedside, given update.

## 2019-08-29 NOTE — ED Notes (Signed)
This RN to bedside to speak with patient regarding discharge. Pt states she pays people to live with her and she does not live alone, while this RN attempting to obtain a phone number from patient regarding who to call for discharge, pt noted to become irate, yelling at this time, "I'm just so upset I could die!" Pt states she is angry due to not receiving any food/drink and having "laid up here for 10 hours". This RN repeatedly asking patient for person/number to call to pick patient up for safe discharge and who lives with patient, this RN asking permission to call from emergency contacts listed in demographics, pt noted to continue to become upset. This RN explained was not primary RN for patient, this RN attempting to provide safe discharge for patient and this RN asked patient not to yell at this RN. Pt states understanding. This RN provided phone for patient to call Ruthann Cancer, states, "he will know who to call to come get me and meet me at home". Ruthann Cancer [phone]) contacted by patient, pt requesting him to come pick her up. Pt aware of pending discharge when ride obtained.

## 2019-08-29 NOTE — Discharge Instructions (Addendum)
Ultrasound here showed a gallstone in the gallbladder but no signs of inflammation or infection.  Her lipase is also minimally elevated.  This can be a sign of inflammation in the pancreas but there is nothing that shows up on the ultrasound and she is not having any abdominal pain so the lipase elevation is probably not significant.  Please return here for any further episodes of confusion or any abdominal pain, vomiting or any other symptoms.  Please give her her medicines as soon as she gets home she has not had them here today.

## 2019-08-29 NOTE — ED Notes (Addendum)
Pt assisted up to toilet in room. Pt able to stand and pivot onto toilet without difficulty. Urine sample collected and sent to lab at this time. Pt requesting something to drink Pt cleared by MD to have PO intake. Pt given diet ginger ale and ice water at this time per pt request

## 2019-08-29 NOTE — ED Notes (Signed)
Pt had episode of bowel incontinence. Pt cleaned up, clean gown placed on pt, purewick and brief placed on pt, linens changed.  Lab called and notified of need for 2nd blood cultures and troponin draw.

## 2019-08-29 NOTE — ED Provider Notes (Signed)
Patient's urinalysis is clear ultrasound shows a gallstone but no signs of cholecystitis lipase is mildly elevated but patient has no pain and no other symptoms.  She has not been confused during her stay here and she is not confused now when I speak with her.  She is a little concerned that she has not gotten her medicines yet but it is only 9:00 in the morning.  I will plan on letting her go she can get her medicines at home.  That should save her from being charged for medicines here.   Nena Polio, MD 08/29/19 [phone]
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
## 2019-08-30 LAB — URINE CULTURE: Culture: <10000 — AB

## 2019-09-03 LAB — CULTURE, BLOOD (ROUTINE X 2)
Culture: NO GROWTH
Culture: NO GROWTH
Special Requests: ADEQUATE

## 2019-10-24 ENCOUNTER — Other Ambulatory Visit: Payer: Self-pay

## 2019-10-24 ENCOUNTER — Inpatient Hospital Stay (HOSPITAL_BASED_OUTPATIENT_CLINIC_OR_DEPARTMENT_OTHER): Payer: Medicare Other | Admitting: Oncology

## 2019-10-24 ENCOUNTER — Inpatient Hospital Stay: Payer: Medicare Other | Attending: Oncology

## 2019-10-24 VITALS — BP 142/72 | HR 68 | Temp 97.8°F | Wt 102.3 lb

## 2019-10-24 DIAGNOSIS — Z8673 Personal history of transient ischemic attack (TIA), and cerebral infarction without residual deficits: Secondary | ICD-10-CM | POA: Insufficient documentation

## 2019-10-24 DIAGNOSIS — D631 Anemia in chronic kidney disease: Secondary | ICD-10-CM | POA: Diagnosis not present

## 2019-10-24 DIAGNOSIS — D5911 Warm autoimmune hemolytic anemia: Secondary | ICD-10-CM | POA: Diagnosis present

## 2019-10-24 DIAGNOSIS — I495 Sick sinus syndrome: Secondary | ICD-10-CM | POA: Diagnosis not present

## 2019-10-24 DIAGNOSIS — R5381 Other malaise: Secondary | ICD-10-CM | POA: Insufficient documentation

## 2019-10-24 DIAGNOSIS — I341 Nonrheumatic mitral (valve) prolapse: Secondary | ICD-10-CM | POA: Diagnosis not present

## 2019-10-24 DIAGNOSIS — I509 Heart failure, unspecified: Secondary | ICD-10-CM | POA: Insufficient documentation

## 2019-10-24 DIAGNOSIS — E785 Hyperlipidemia, unspecified: Secondary | ICD-10-CM | POA: Diagnosis not present

## 2019-10-24 DIAGNOSIS — Z7984 Long term (current) use of oral hypoglycemic drugs: Secondary | ICD-10-CM | POA: Insufficient documentation

## 2019-10-24 DIAGNOSIS — I4891 Unspecified atrial fibrillation: Secondary | ICD-10-CM | POA: Insufficient documentation

## 2019-10-24 DIAGNOSIS — D591 Autoimmune hemolytic anemia, unspecified: Secondary | ICD-10-CM

## 2019-10-24 DIAGNOSIS — I129 Hypertensive chronic kidney disease with stage 1 through stage 4 chronic kidney disease, or unspecified chronic kidney disease: Secondary | ICD-10-CM | POA: Diagnosis not present

## 2019-10-24 DIAGNOSIS — I2510 Atherosclerotic heart disease of native coronary artery without angina pectoris: Secondary | ICD-10-CM | POA: Diagnosis not present

## 2019-10-24 DIAGNOSIS — E119 Type 2 diabetes mellitus without complications: Secondary | ICD-10-CM | POA: Diagnosis not present

## 2019-10-24 DIAGNOSIS — Z79899 Other long term (current) drug therapy: Secondary | ICD-10-CM

## 2019-10-24 DIAGNOSIS — Z7901 Long term (current) use of anticoagulants: Secondary | ICD-10-CM | POA: Insufficient documentation

## 2019-10-24 DIAGNOSIS — K219 Gastro-esophageal reflux disease without esophagitis: Secondary | ICD-10-CM | POA: Insufficient documentation

## 2019-10-24 DIAGNOSIS — R5383 Other fatigue: Secondary | ICD-10-CM | POA: Insufficient documentation

## 2019-10-24 DIAGNOSIS — M129 Arthropathy, unspecified: Secondary | ICD-10-CM | POA: Diagnosis not present

## 2019-10-24 DIAGNOSIS — R197 Diarrhea, unspecified: Secondary | ICD-10-CM | POA: Diagnosis not present

## 2019-10-24 LAB — COMPREHENSIVE METABOLIC PANEL
ALT: 15 U/L (ref 0–44)
AST: 19 U/L (ref 15–41)
Albumin: 3.9 g/dL (ref 3.5–5.0)
Alkaline Phosphatase: 66 U/L (ref 38–126)
Anion gap: 7 (ref 5–15)
BUN: 14 mg/dL (ref 8–23)
CO2: 28 mmol/L (ref 22–32)
Calcium: 9.3 mg/dL (ref 8.9–10.3)
Chloride: 103 mmol/L (ref 98–111)
Creatinine, Ser: 0.63 mg/dL (ref 0.44–1.00)
GFR calc Af Amer: >60 mL/min (ref >60)
GFR calc non Af Amer: >60 mL/min (ref >60)
Glucose, Bld: 194 mg/dL — ABNORMAL HIGH (ref 70–99)
Potassium: 3.9 mmol/L (ref 3.5–5.1)
Sodium: 138 mmol/L (ref 135–145)
Total Bilirubin: 1.1 mg/dL (ref 0.3–1.2)
Total Protein: 6.5 g/dL (ref 6.5–8.1)

## 2019-10-24 LAB — CBC WITH DIFFERENTIAL/PLATELET
Abs Immature Granulocytes: 0.02 10*3/uL (ref 0.00–0.07)
Basophils Absolute: 0 10*3/uL (ref 0.0–0.1)
Basophils Relative: 0 %
Eosinophils Absolute: 0.1 10*3/uL (ref 0.0–0.5)
Eosinophils Relative: 1 %
HCT: 35.7 % — ABNORMAL LOW (ref 36.0–46.0)
Hemoglobin: 11.5 g/dL — ABNORMAL LOW (ref 12.0–15.0)
Immature Granulocytes: 0 %
Lymphocytes Relative: 19 %
Lymphs Abs: 1 10*3/uL (ref 0.7–4.0)
MCH: 27.4 pg (ref 26.0–34.0)
MCHC: 32.2 g/dL (ref 30.0–36.0)
MCV: 85 fL (ref 80.0–100.0)
Monocytes Absolute: 0.5 10*3/uL (ref 0.1–1.0)
Monocytes Relative: 9 %
Neutro Abs: 3.7 10*3/uL (ref 1.7–7.7)
Neutrophils Relative %: 71 %
Platelets: 234 10*3/uL (ref 150–400)
RBC: 4.2 MIL/uL (ref 3.87–5.11)
RDW: 14 % (ref 11.5–15.5)
WBC: 5.2 10*3/uL (ref 4.0–10.5)
nRBC: 0 % (ref 0.0–0.2)

## 2019-10-24 LAB — RETICULOCYTES
Immature Retic Fract: 9 % (ref 2.3–15.9)
RBC.: 4.21 MIL/uL (ref 3.87–5.11)
Retic Count, Absolute: 52.2 10*3/uL (ref 19.0–186.0)
Retic Ct Pct: 1.2 % (ref 0.4–3.1)

## 2019-10-24 MED ORDER — MYCOPHENOLATE MOFETIL 500 MG PO TABS: 500.0000 mg | ORAL_TABLET | Freq: Every day | ORAL | 0 refills | Status: DC

## 2019-10-24 MED ORDER — MYCOPHENOLATE MOFETIL 500 MG PO TABS: 500.0000 mg | ORAL_TABLET | Freq: Two times a day (BID) | ORAL | 0 refills | Status: DC

## 2019-10-24 NOTE — Progress Notes (Signed)
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 fib on Coumadin, sick sinus syndrome status post pacemaker placement and history of breast cancer status post left mastectomy in the past. She was admitted to [facility] on 04/18/2018 with symptoms of right-sided weakness and expressive a aphasia. She was treated for an acute left MCA ischemic stroke. She was started on apixaban 2.5 mg twice daily and aspirin/Coumadinwas discontinued. During that admission she was also found to have a hemoglobin of 7. Prior to that she had her last normal hemoglobin sometime in June 2019 when it was 13 and then gradually drifted down to 9 there wasalso some concern for GI bleed and she had seen [facility] in the past. Her hemoglobin did improve back up to 13 in August 2019 and remained stable between 12-13 until November 2019  During her [facility] hospitalization patient was diagnosed with warm autoimmune hemolytic anemia. Hematology was consulted and given her recent stroke, underlying diabetes-steroids was not offered as for first-line treatment and she was started on CellCept instead 500 mg twice daily she was supposed to follow-up with [facility] hematology as an outpatient but preferred to get care locally at [facility].CT chest abdomen and  pelvis did not reveal any evidence of malignancy.Multiple myeloma panel did not reveal any M protein. Serum free light chain ratio was abnormal at 2.18. Patient has responded to mycophenolate and her hemoglobin has been between 11-12 since September 2020  Interval history-patient feels at her baseline overall.  She has chronic fatigue.  She was hospitalized in May 2021 for diarrhea and acute kidney injury.  No new complaints since then.  She has a caregiver with her during the day  ECOG PS- 1 Pain scale- 0   Review of systems- Review of Systems  Constitutional: Positive for malaise/fatigue. Negative for chills, fever and weight loss.  HENT: Negative for congestion, ear discharge and nosebleeds.   Eyes: Negative for blurred vision.  Respiratory: Negative for cough, hemoptysis, sputum production, shortness of breath and wheezing.   Cardiovascular: Positive for leg swelling. Negative for chest pain, palpitations, orthopnea and claudication.  Gastrointestinal: Negative for abdominal pain, blood in stool, constipation, diarrhea, heartburn, melena, nausea and vomiting.  Genitourinary: Negative for dysuria, flank pain, frequency, hematuria and urgency.  Musculoskeletal: Negative for back pain, joint pain and myalgias.  Skin: Negative for rash.  Neurological: Negative for dizziness, tingling, focal weakness, seizures, weakness and headaches.  Endo/Heme/Allergies: Does not bruise/bleed easily.  Psychiatric/Behavioral: Negative for depression and suicidal ideas. The patient does not have insomnia.       Allergies  Allergen Reactions  . Codeine Nausea Only  . Disopyramide     Other reaction(s): Unknown  . Ibuprofen Diarrhea  . Iodine     blisters  . Metformin And Related   . Norpace [Disopyramide Phosphate]   . Quinidine     Other reaction(s): Unknown  . Terfenadine  Other reaction(s): Unknown  . Topiramate     Other reaction(s): Other (See Comments) Hair loss  . Verapamil      Other reaction(s): Unknown  . Dexamethasone Sodium Phosphate Palpitations     Past Medical History:  Diagnosis Date  . A-fib (HCC)   . Anemia   . Arthritis   . Breast cancer (HCC) 1978   left breast with lymph node removal  . CHF (congestive heart failure) (HCC)   . Diabetes mellitus without complication (HCC)   . Diverticulitis   . Dysrhythmia   . GERD (gastroesophageal reflux disease)   . Hyperlipidemia   . Macular degeneration of both eyes   . Mitral valve prolapse   . Mitral valve regurgitation   . Presence of permanent cardiac pacemaker      Past Surgical History:  Procedure Laterality Date  . ABDOMINAL HYSTERECTOMY    . APPENDECTOMY    . BREAST SURGERY    . CARDIAC CATHETERIZATION    . ESOPHAGEAL DILATION    . EYE SURGERY Bilateral    Cataract Extraction with IOL  . INSERT / REPLACE / REMOVE PACEMAKER    . IRRIGATION AND DEBRIDEMENT HEMATOMA Left 08/21/2015   Procedure: IRRIGATION AND DEBRIDEMENT HEMATOMA;  Surgeon: Earline Mayotte, MD;  Location: [facility] ORS;  Service: General;  Laterality: Left;  . KNEE ARTHROSCOPY Right   . LEFT OOPHORECTOMY Left   . MASTECTOMY Left 1978  . MASTECTOMY Right 1978  . OPEN REDUCTION INTERNAL FIXATION (ORIF) DISTAL RADIAL FRACTURE Right 02/14/2018   Procedure: OPEN REDUCTION INTERNAL FIXATION (ORIF) DISTAL RADIAL FRACTURE;  Surgeon: Kennedy Bucker, MD;  Location: [facility] ORS;  Service: Orthopedics;  Laterality: Right;  . PACEMAKER INSERTION  08/11/12  . TEMPORAL ARTERY BIOPSY / LIGATION    . TONSILLECTOMY      Social History   Socioeconomic History  . Marital status: Widowed    Spouse name: Not on file  . Number of children: 1  . Years of education: 71  . Highest education level: 11th grade  Occupational History  . Not on file  Tobacco Use  . Smoking status: Never Smoker  . Smokeless tobacco: Never Used  Vaping Use  . Vaping Use: Never used  Substance and Sexual Activity  . Alcohol use: No    Alcohol/week: 0.0 standard  drinks  . Drug use: No  . Sexual activity: Not on file  Other Topics Concern  . Not on file  Social History Narrative   Widowed   1 son   Never smoker or smokeless tobacco user   No alcohol use   Full Code   Social Determinants of Health   Financial Resource Strain:   . Difficulty of Paying Living Expenses:   Food Insecurity:   . Worried About Programme researcher, broadcasting/film/video in the Last Year:   . Barista in the Last Year:   Transportation Needs:   . Freight forwarder (Medical):   Marland Kitchen Lack of Transportation (Non-Medical):   Physical Activity:   . Days of Exercise per Week:   . Minutes of Exercise per Session:   Stress:   . Feeling of Stress :   Social Connections:   . Frequency of Communication with Friends and Family:   . Frequency of Social Gatherings with Friends and Family:   . Attends Religious Services:   . Active Member of Clubs or Organizations:   . Attends Banker Meetings:   Marland Kitchen Marital Status:   Intimate Partner Violence:   .  Fear of Current or Ex-Partner:   . Emotionally Abused:   Marland Kitchen Physically Abused:   . Sexually Abused:     Family History  Problem Relation Age of Onset  . Hypertension Mother      Current Outpatient Medications:  .  apixaban (ELIQUIS) 2.5 MG TABS tablet, Take 1 tablet (2.5 mg total) by mouth 2 (two) times daily., Disp: 60 tablet, Rfl: 0 .  atorvastatin (LIPITOR) 20 MG tablet, Take 1 tablet (20 mg total) by mouth at bedtime., Disp: 30 tablet, Rfl: 0 .  carboxymethylcellulose (REFRESH PLUS) 0.5 % SOLN, 1 drop 3 (three) times daily as needed., Disp: , Rfl:  .  digoxin (LANOXIN) 0.125 MG tablet, Take 1 tablet (0.125 mg total) by mouth every other day. CHF- * Hold for HR < 60, Disp: 15 tablet, Rfl: 0 .  famotidine (PEPCID) 20 MG tablet, SMARTSIG:1 Tablet(s) By Mouth Every Evening, Disp: , Rfl:  .  glimepiride (AMARYL) 2 MG tablet, Take 1 tablet (2 mg total) by mouth 2 (two) times daily., Disp: 60 tablet, Rfl: 0 .  isosorbide  mononitrate (IMDUR) 30 MG 24 hr tablet, Take 1 tablet by mouth daily., Disp: , Rfl:  .  metoprolol succinate (TOPROL-XL) 50 MG 24 hr tablet, Take 1 tablet (50 mg total) by mouth daily., Disp: 30 tablet, Rfl: 0 .  mirtazapine (REMERON) 15 MG tablet, Take 15 mg by mouth at bedtime., Disp: , Rfl:  .  Multiple Vitamins-Minerals (PRESERVISION AREDS 2 PO), Take 1 tablet by mouth 2 (two) times daily., Disp: , Rfl:  .  mycophenolate (CELLCEPT) 500 MG tablet, Take 1 tablet (500 mg total) by mouth daily. Take 1 tablet 500mg  daily for 1 week, Take 1 tablet 500mg  every other day for 2 weeks,  then stop on week 4, Disp: 15 tablet, Rfl: 0 .  nitroGLYCERIN (NITROSTAT) 0.4 MG SL tablet, DISSOLVE ONE TABLET UNDER THE TONGUE AS NEEDED FOR CHEST PAIN. MAY REPEAT AS INDICATED BY YOUR DOCTOR., Disp: , Rfl:  .  omeprazole (PRILOSEC) 20 MG capsule, Take 1 capsule by mouth 1 day or 1 dose., Disp: , Rfl:  .  potassium chloride (KLOR-CON) 10 MEQ tablet, Take 1 tablet by mouth in the morning and at bedtime., Disp: , Rfl:   Physical exam:  Vitals:   10/24/19 1143  BP: (!) 142/72  Pulse: 68  Temp: 97.8 F (36.6 C)  TempSrc: Oral  SpO2: 100%  Weight: 102 lb 4.8 oz (46.4 kg)   Physical Exam Constitutional:      Comments: She is sitting in a wheelchair.  Appears in no acute distress  Cardiovascular:     Rate and Rhythm: Normal rate and regular rhythm.     Heart sounds: Normal heart sounds.  Pulmonary:     Effort: Pulmonary effort is normal.     Breath sounds: Normal breath sounds.  Abdominal:     General: Bowel sounds are normal.     Palpations: Abdomen is soft.  Musculoskeletal:     Right lower leg: Edema present.     Left lower leg: Edema present.  Skin:    General: Skin is warm and dry.  Neurological:     Mental Status: She is alert and oriented to person, place, and time.      CMP Latest Ref Rng & Units 10/24/2019  Glucose 70 - 99 mg/dL 161(W)  BUN 8 - 23 mg/dL 14  Creatinine 9.60 - 4.54 mg/dL 0.98    Sodium 119 - 147 mmol/L 138  Potassium  3.5 - 5.1 mmol/L 3.9  Chloride 98 - 111 mmol/L 103  CO2 22 - 32 mmol/L 28  Calcium 8.9 - 10.3 mg/dL 9.3  Total Protein 6.5 - 8.1 g/dL 6.5  Total Bilirubin 0.3 - 1.2 mg/dL 1.1  Alkaline Phos 38 - 126 U/L 66  AST 15 - 41 U/L 19  ALT 0 - 44 U/L 15   CBC Latest Ref Rng & Units 10/24/2019  WBC 4.0 - 10.5 K/uL 5.2  Hemoglobin 12.0 - 15.0 g/dL 11.5(L)  Hematocrit 36 - 46 % 35.7(L)  Platelets 150 - 400 K/uL 234      Assessment and plan- Patient is a 86 y.o. female with warm autoimmune hemolytic anemia here for routine follow-up  Patient's hemoglobin at the time of diagnosis of her hemolytic anemia was down to 8.  Over the last 1 year since September 2020 her hemoglobin has remained stable between 11-12.  Hemolysis labs from today are pending but prior labs from April 2021 showed a normal reticulocyte count and haptoglobin.  She has been on CellCept for over a year and a half now.  I will plan to taper her off CellCept and see what happens to her anemia.    I would like her to take CellCept 500 mg once a day for 2 weeks followed by 500 mg every other day for 2 weeks and then come off CellCept altogether.  I will see her back in 3 months time with CBC with differential, CMP, reticulocyte count and haptoglobin   Visit Diagnosis 1. Autoimmune hemolytic anemia   2. High risk medication use      Dr. Owens Shark, MD, MPH [facility] [phone] 10/24/2019 1:27 PM

## 2019-10-25 LAB — HAPTOGLOBIN: Haptoglobin: 116 mg/dL (ref 41–333)

## 2019-10-30 ENCOUNTER — Ambulatory Visit: Payer: Medicare Other | Admitting: Oncology

## 2019-10-30 ENCOUNTER — Other Ambulatory Visit: Payer: Medicare Other

## 2019-11-14 IMAGING — US ULTRASOUND ABDOMEN COMPLETE
1 series · 14 of 25 positions shown · non-contrast
Comparison: 06/30/15

CLINICAL DATA: Abdominal distension for several months

EXAM:
ABDOMEN ULTRASOUND COMPLETE

[Series 1: ultrasound abdomen complete · 14 of 127 slices shown]
[im 1/127]
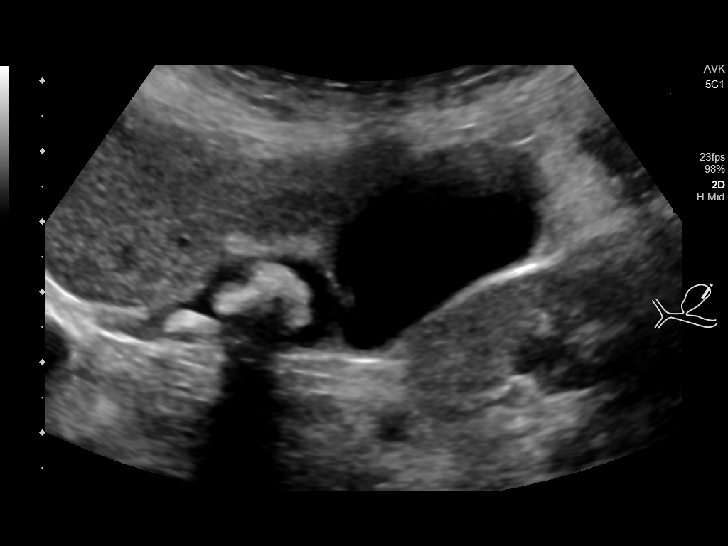
[im 11/127]
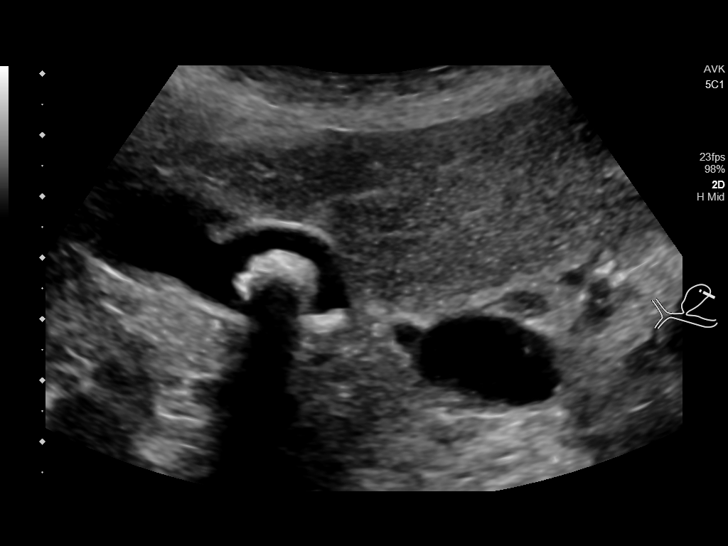
[im 22/127]
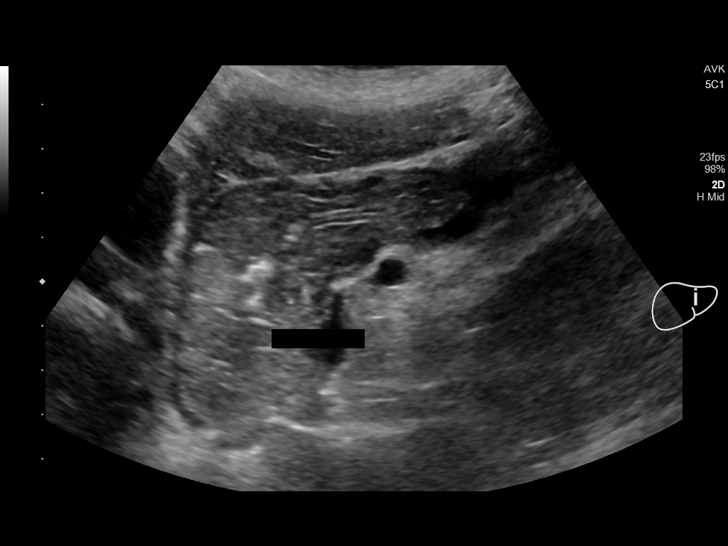
[im 32/127]
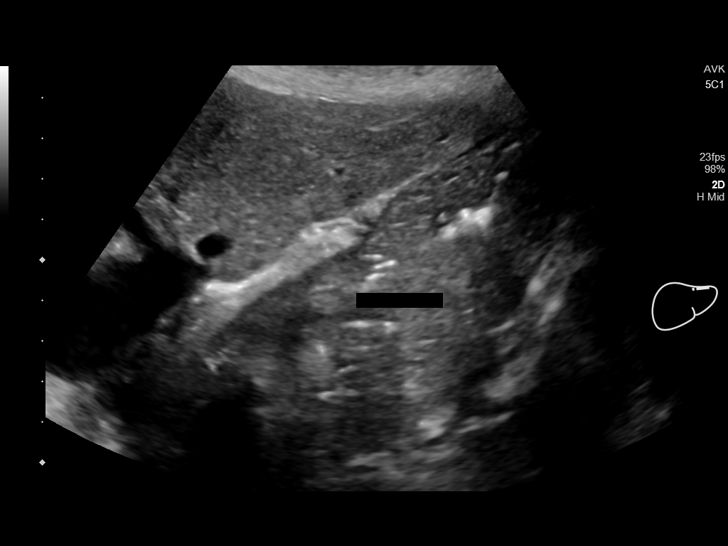
[im 43/127]
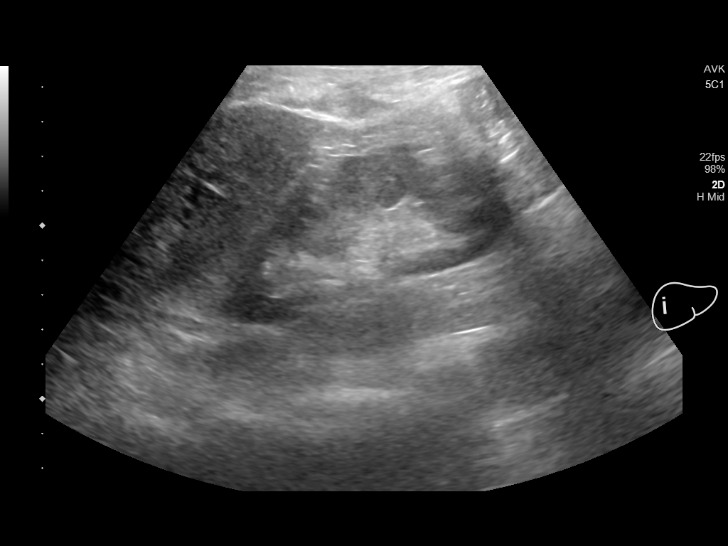
[im 48/127]
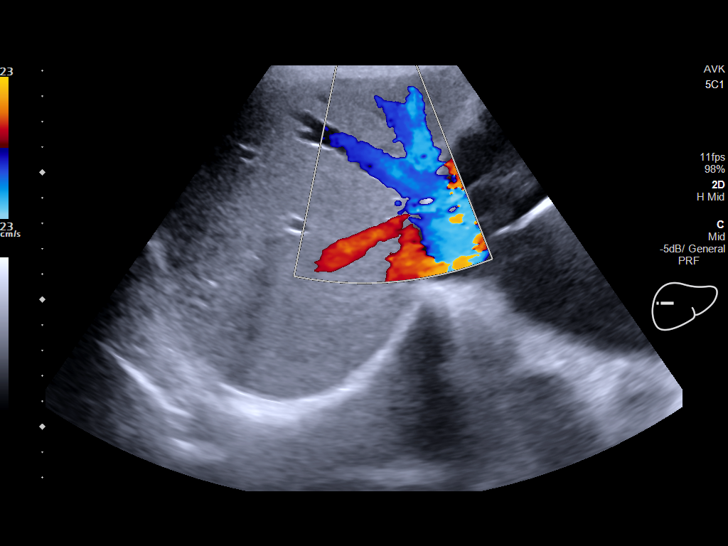
[im 58/127]
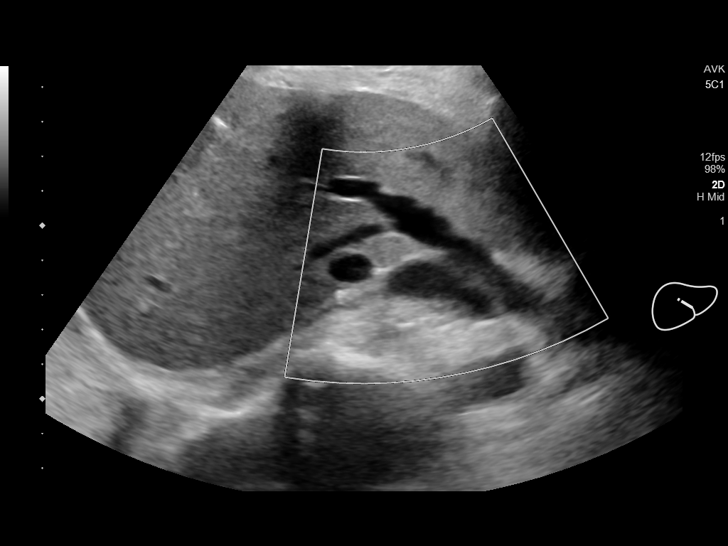
[im 69/127]
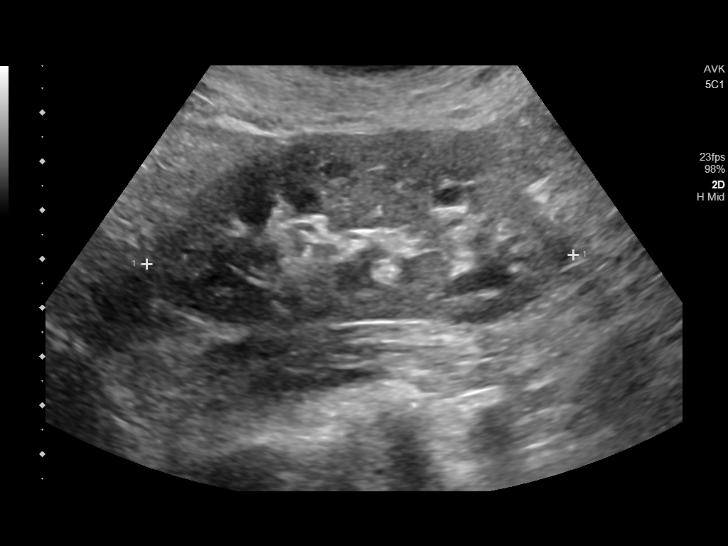
[im 79/127]
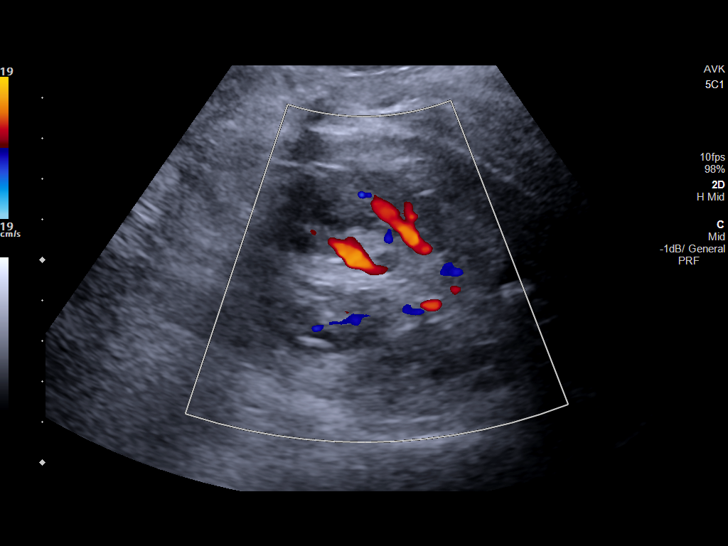
[im 85/127]
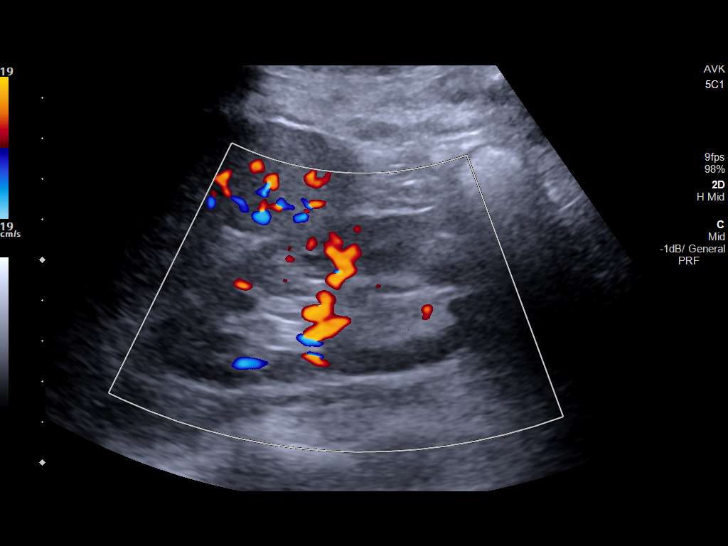
[im 95/127]
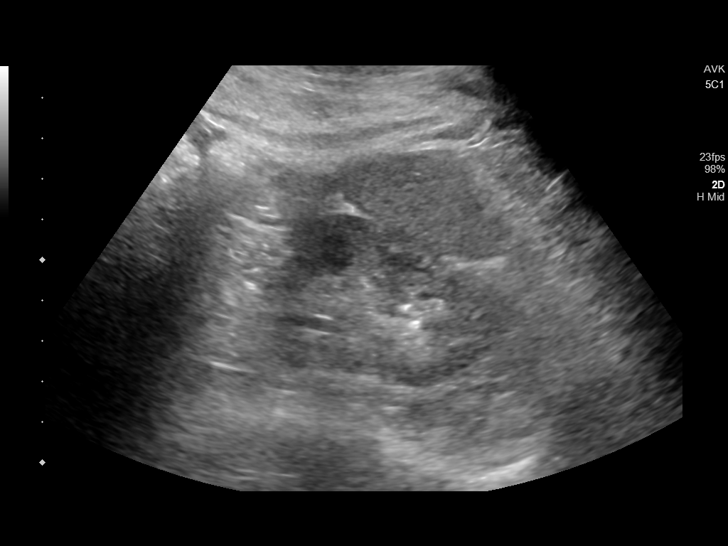
[im 106/127]
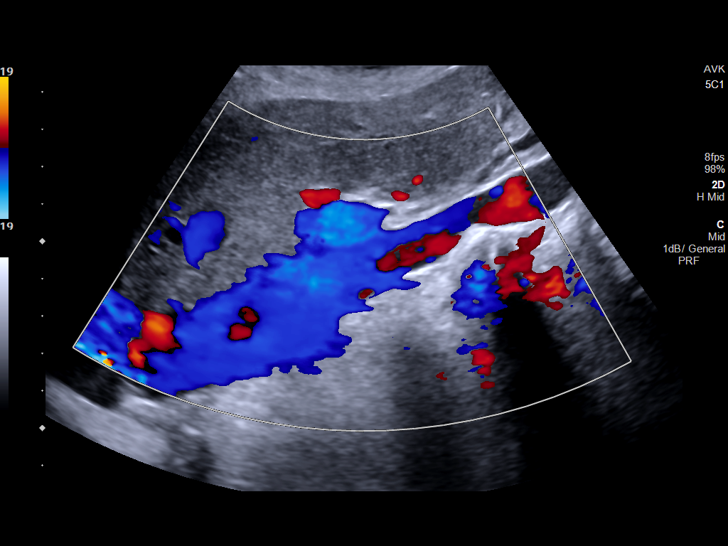
[im 116/127]
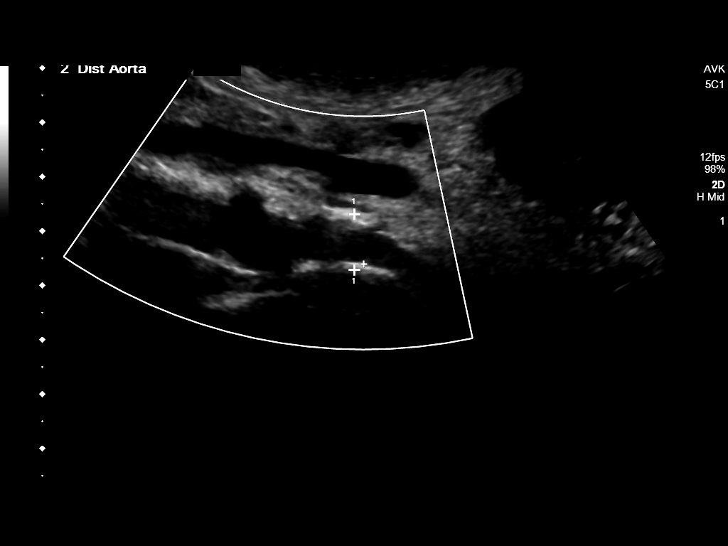
[im 127/127]
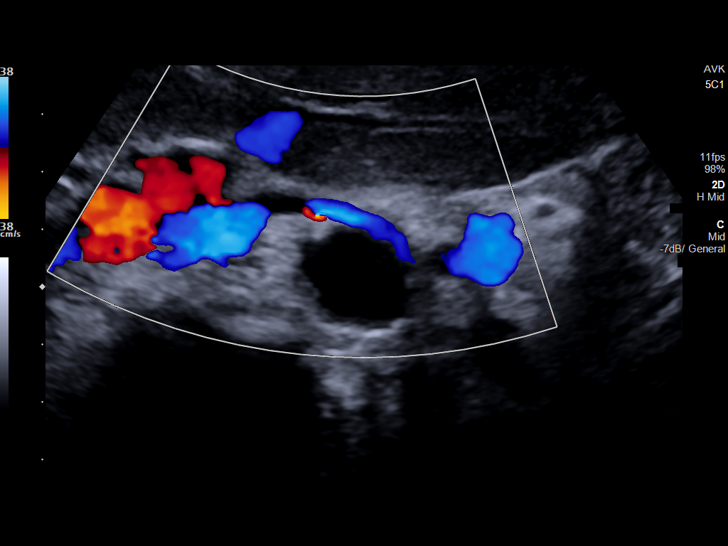

[14 of 25 positions shown; findings below may reference images not displayed]

FINDINGS: Gallbladder: Well distended with multiple gallstones consistent with
that seen on the prior CT examination. No wall thickness or
pericholecystic fluid is noted. Negative sonographic Murphy sign is
elicited.

Common bile duct: Diameter: 3 mm.

Liver: No focal lesion identified. Within normal limits in
parenchymal echogenicity. Portal vein is patent on color Doppler
imaging with normal direction of blood flow towards the liver.

IVC: No abnormality visualized.

Pancreas: Visualized portion unremarkable.

Spleen: Size and appearance within normal limits.

Right Kidney: Length: 8.7 cm. Echogenicity within normal limits. No
mass or hydronephrosis visualized.

Left Kidney: Length: 8.9 cm. Echogenicity within normal limits. No
mass or hydronephrosis visualized.

Abdominal aorta: No aneurysm visualized.

Other findings: None.
IMPRESSION: Cholelithiasis without complicating factors. No new focal
abnormality is noted.

## 2020-01-08 ENCOUNTER — Other Ambulatory Visit: Payer: Self-pay | Admitting: Cardiology

## 2020-01-08 DIAGNOSIS — I639 Cerebral infarction, unspecified: Secondary | ICD-10-CM

## 2020-01-16 ENCOUNTER — Ambulatory Visit
Admission: RE | Admit: 2020-01-16 | Discharge: 2020-01-16 | Disposition: A | Discharge status: Home / Self Care | Payer: Medicare Other | Source: Ambulatory Visit | Attending: Cardiology | Admitting: Cardiology

## 2020-01-16 ENCOUNTER — Other Ambulatory Visit: Payer: Self-pay

## 2020-01-16 DIAGNOSIS — I639 Cerebral infarction, unspecified: Secondary | ICD-10-CM | POA: Insufficient documentation

## 2020-01-23 ENCOUNTER — Encounter: Payer: Self-pay | Admitting: Oncology

## 2020-01-23 ENCOUNTER — Inpatient Hospital Stay (HOSPITAL_BASED_OUTPATIENT_CLINIC_OR_DEPARTMENT_OTHER): Payer: Medicare Other | Admitting: Oncology

## 2020-01-23 ENCOUNTER — Other Ambulatory Visit: Payer: Self-pay

## 2020-01-23 ENCOUNTER — Inpatient Hospital Stay: Payer: Medicare Other | Attending: Oncology

## 2020-01-23 VITALS — BP 134/68 | HR 72 | Temp 96.0°F | Resp 16 | Wt 96.0 lb

## 2020-01-23 DIAGNOSIS — I509 Heart failure, unspecified: Secondary | ICD-10-CM | POA: Diagnosis not present

## 2020-01-23 DIAGNOSIS — Z853 Personal history of malignant neoplasm of breast: Secondary | ICD-10-CM | POA: Diagnosis not present

## 2020-01-23 DIAGNOSIS — I341 Nonrheumatic mitral (valve) prolapse: Secondary | ICD-10-CM | POA: Insufficient documentation

## 2020-01-23 DIAGNOSIS — I110 Hypertensive heart disease with heart failure: Secondary | ICD-10-CM | POA: Insufficient documentation

## 2020-01-23 DIAGNOSIS — D5911 Warm autoimmune hemolytic anemia: Secondary | ICD-10-CM | POA: Diagnosis present

## 2020-01-23 DIAGNOSIS — Z7901 Long term (current) use of anticoagulants: Secondary | ICD-10-CM | POA: Diagnosis not present

## 2020-01-23 DIAGNOSIS — E785 Hyperlipidemia, unspecified: Secondary | ICD-10-CM | POA: Diagnosis not present

## 2020-01-23 DIAGNOSIS — I2510 Atherosclerotic heart disease of native coronary artery without angina pectoris: Secondary | ICD-10-CM | POA: Diagnosis not present

## 2020-01-23 DIAGNOSIS — D591 Autoimmune hemolytic anemia, unspecified: Secondary | ICD-10-CM

## 2020-01-23 DIAGNOSIS — Z79899 Other long term (current) drug therapy: Secondary | ICD-10-CM | POA: Insufficient documentation

## 2020-01-23 DIAGNOSIS — Z7984 Long term (current) use of oral hypoglycemic drugs: Secondary | ICD-10-CM | POA: Diagnosis not present

## 2020-01-23 DIAGNOSIS — I4891 Unspecified atrial fibrillation: Secondary | ICD-10-CM | POA: Diagnosis not present

## 2020-01-23 DIAGNOSIS — I495 Sick sinus syndrome: Secondary | ICD-10-CM | POA: Insufficient documentation

## 2020-01-23 DIAGNOSIS — Z8673 Personal history of transient ischemic attack (TIA), and cerebral infarction without residual deficits: Secondary | ICD-10-CM | POA: Insufficient documentation

## 2020-01-23 DIAGNOSIS — I639 Cerebral infarction, unspecified: Secondary | ICD-10-CM | POA: Diagnosis not present

## 2020-01-23 DIAGNOSIS — Z9012 Acquired absence of left breast and nipple: Secondary | ICD-10-CM | POA: Insufficient documentation

## 2020-01-23 DIAGNOSIS — K219 Gastro-esophageal reflux disease without esophagitis: Secondary | ICD-10-CM | POA: Diagnosis not present

## 2020-01-23 DIAGNOSIS — E119 Type 2 diabetes mellitus without complications: Secondary | ICD-10-CM | POA: Insufficient documentation

## 2020-01-23 LAB — CBC WITH DIFFERENTIAL/PLATELET
Abs Immature Granulocytes: 0.02 10*3/uL (ref 0.00–0.07)
Basophils Absolute: 0 10*3/uL (ref 0.0–0.1)
Basophils Relative: 1 %
Eosinophils Absolute: 0.1 10*3/uL (ref 0.0–0.5)
Eosinophils Relative: 2 %
HCT: 35.9 % — ABNORMAL LOW (ref 36.0–46.0)
Hemoglobin: 11.1 g/dL — ABNORMAL LOW (ref 12.0–15.0)
Immature Granulocytes: 0 %
Lymphocytes Relative: 20 %
Lymphs Abs: 0.9 10*3/uL (ref 0.7–4.0)
MCH: 25.6 pg — ABNORMAL LOW (ref 26.0–34.0)
MCHC: 30.9 g/dL (ref 30.0–36.0)
MCV: 82.9 fL (ref 80.0–100.0)
Monocytes Absolute: 0.5 10*3/uL (ref 0.1–1.0)
Monocytes Relative: 10 %
Neutro Abs: 3.1 10*3/uL (ref 1.7–7.7)
Neutrophils Relative %: 67 %
Platelets: 232 10*3/uL (ref 150–400)
RBC: 4.33 MIL/uL (ref 3.87–5.11)
RDW: 13.4 % (ref 11.5–15.5)
WBC: 4.6 10*3/uL (ref 4.0–10.5)
nRBC: 0 % (ref 0.0–0.2)

## 2020-01-23 LAB — COMPREHENSIVE METABOLIC PANEL
ALT: 30 U/L (ref 0–44)
AST: 29 U/L (ref 15–41)
Albumin: 3.6 g/dL (ref 3.5–5.0)
Alkaline Phosphatase: 79 U/L (ref 38–126)
Anion gap: 9 (ref 5–15)
BUN: 14 mg/dL (ref 8–23)
CO2: 28 mmol/L (ref 22–32)
Calcium: 8.8 mg/dL — ABNORMAL LOW (ref 8.9–10.3)
Chloride: 103 mmol/L (ref 98–111)
Creatinine, Ser: 0.77 mg/dL (ref 0.44–1.00)
GFR, Estimated: >60 mL/min (ref >60)
Glucose, Bld: 212 mg/dL — ABNORMAL HIGH (ref 70–99)
Potassium: 3.6 mmol/L (ref 3.5–5.1)
Sodium: 140 mmol/L (ref 135–145)
Total Bilirubin: 0.8 mg/dL (ref 0.3–1.2)
Total Protein: 6.6 g/dL (ref 6.5–8.1)

## 2020-01-23 LAB — RETICULOCYTES
Immature Retic Fract: 9.4 % (ref 2.3–15.9)
RBC.: 4.21 MIL/uL (ref 3.87–5.11)
Retic Count, Absolute: 38.3 10*3/uL (ref 19.0–186.0)
Retic Ct Pct: 0.9 % (ref 0.4–3.1)

## 2020-01-24 LAB — HAPTOGLOBIN: Haptoglobin: 147 mg/dL (ref 41–333)

## 2020-01-25 NOTE — Progress Notes (Signed)
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 fib on Coumadin, sick sinus syndrome status post pacemaker placement and history of breast cancer status post left mastectomy in the past. She was admitted to [facility] on 04/18/2018 with symptoms of right-sided weakness and expressive a aphasia. She was treated for an acute left MCA ischemic stroke. She was started on apixaban 2.5 mg twice daily and aspirin/Coumadinwas discontinued. During that admission she was also found to have a hemoglobin of 7. Prior to that she had her last normal hemoglobin sometime in June 2019 when it was 13 and then gradually drifted down to 9 there wasalso some concern for GI bleed and she had seen [facility] in the past. Her hemoglobin did improve back up to 13 in August 2019 and remained stable between 12-13 until November 2019  During her [facility] hospitalization patient was diagnosed with warm autoimmune hemolytic anemia. Hematology was consulted and given her recent stroke, underlying diabetes-steroids was not offered as for first-line treatment and she was started on CellCept instead 500 mg twice daily she was supposed to follow-up with [facility] hematology as an outpatient but preferred to get care locally at [facility].CT chest abdomen and  pelvis did not reveal any evidence of malignancy.Multiple myeloma panel did not reveal any M protein. Serum free light chain ratio was abnormal at 2.18. Patient has responded to mycophenolate and her hemoglobin has been between 11-12 since September 2020  Interval history-patient is doing well for her age.  Has not had any recent hospitalizations or infections.  She lives alone and is independent of her ADLs but has 24-hour caregivers to manage her IADLs. ECOG PS- 2 Pain scale- 0   Review of systems- Review of Systems  Constitutional: Positive for malaise/fatigue. Negative for chills, fever and weight loss.  HENT: Negative for congestion, ear discharge and nosebleeds.   Eyes: Negative for blurred vision.  Respiratory: Negative for cough, hemoptysis, sputum production, shortness of breath and wheezing.   Cardiovascular: Negative for chest pain, palpitations, orthopnea and claudication.  Gastrointestinal: Negative for abdominal pain, blood in stool, constipation, diarrhea, heartburn, melena, nausea and vomiting.  Genitourinary: Negative for dysuria, flank pain, frequency, hematuria and urgency.  Musculoskeletal: Negative for back pain, joint pain and myalgias.  Skin: Negative for rash.  Neurological: Negative for dizziness, tingling, focal weakness, seizures, weakness and headaches.  Endo/Heme/Allergies: Does not bruise/bleed easily.  Psychiatric/Behavioral: Negative for depression and suicidal ideas. The patient does not have insomnia.       Allergies  Allergen Reactions  . Codeine Nausea Only  . Disopyramide     Other reaction(s): Unknown  . Ibuprofen Diarrhea  . Iodine     blisters  . Metformin And Related   . Norpace [Disopyramide Phosphate]   . Quinidine     Other reaction(s): Unknown  . Terfenadine     Other reaction(s): Unknown  . Topiramate  Other reaction(s): Other (See Comments) Hair loss  . Verapamil     Other reaction(s): Unknown  . Dexamethasone Sodium  Phosphate Palpitations     Past Medical History:  Diagnosis Date  . A-fib (HCC)   . Anemia   . Arthritis   . Breast cancer (HCC) 1978   left breast with lymph node removal  . CHF (congestive heart failure) (HCC)   . Diabetes mellitus without complication (HCC)   . Diverticulitis   . Dysrhythmia   . GERD (gastroesophageal reflux disease)   . Hyperlipidemia   . Macular degeneration of both eyes   . Mitral valve prolapse   . Mitral valve regurgitation   . Presence of permanent cardiac pacemaker      Past Surgical History:  Procedure Laterality Date  . ABDOMINAL HYSTERECTOMY    . APPENDECTOMY    . BREAST SURGERY    . CARDIAC CATHETERIZATION    . ESOPHAGEAL DILATION    . EYE SURGERY Bilateral    Cataract Extraction with IOL  . INSERT / REPLACE / REMOVE PACEMAKER    . IRRIGATION AND DEBRIDEMENT HEMATOMA Left 08/21/2015   Procedure: IRRIGATION AND DEBRIDEMENT HEMATOMA;  Surgeon: Earline Mayotte, MD;  Location: [facility] ORS;  Service: General;  Laterality: Left;  . KNEE ARTHROSCOPY Right   . LEFT OOPHORECTOMY Left   . MASTECTOMY Left 1978  . MASTECTOMY Right 1978  . OPEN REDUCTION INTERNAL FIXATION (ORIF) DISTAL RADIAL FRACTURE Right 02/14/2018   Procedure: OPEN REDUCTION INTERNAL FIXATION (ORIF) DISTAL RADIAL FRACTURE;  Surgeon: Kennedy Bucker, MD;  Location: [facility] ORS;  Service: Orthopedics;  Laterality: Right;  . PACEMAKER INSERTION  08/11/12  . TEMPORAL ARTERY BIOPSY / LIGATION    . TONSILLECTOMY      Social History   Socioeconomic History  . Marital status: Widowed    Spouse name: Not on file  . Number of children: 1  . Years of education: 14  . Highest education level: 11th grade  Occupational History  . Not on file  Tobacco Use  . Smoking status: Never Smoker  . Smokeless tobacco: Never Used  Vaping Use  . Vaping Use: Never used  Substance and Sexual Activity  . Alcohol use: No    Alcohol/week: 0.0 standard drinks  . Drug use: No  . Sexual activity: Not on  file  Other Topics Concern  . Not on file  Social History Narrative   Widowed   1 son   Never smoker or smokeless tobacco user   No alcohol use   Full Code   Social Determinants of Health   Financial Resource Strain:   . Difficulty of Paying Living Expenses: Not on file  Food Insecurity:   . Worried About Programme researcher, broadcasting/film/video in the Last Year: Not on file  . Ran Out of Food in the Last Year: Not on file  Transportation Needs:   . Lack of Transportation (Medical): Not on file  . Lack of Transportation (Non-Medical): Not on file  Physical Activity:   . Days of Exercise per Week: Not on file  . Minutes of Exercise per Session: Not on file  Stress:   . Feeling of Stress : Not on file  Social Connections:   . Frequency of Communication with Friends and Family: Not on file  . Frequency of Social Gatherings with Friends and Family: Not on file  . Attends Religious Services: Not on file  . Active Member of Clubs or Organizations: Not on file  . Attends  Club or Organization Meetings: Not on file  . Marital Status: Not on file  Intimate Partner Violence:   . Fear of Current or Ex-Partner: Not on file  . Emotionally Abused: Not on file  . Physically Abused: Not on file  . Sexually Abused: Not on file    Family History  Problem Relation Age of Onset  . Hypertension Mother      Current Outpatient Medications:  .  apixaban (ELIQUIS) 2.5 MG TABS tablet, Take 1 tablet (2.5 mg total) by mouth 2 (two) times daily., Disp: 60 tablet, Rfl: 0 .  atorvastatin (LIPITOR) 20 MG tablet, Take 1 tablet (20 mg total) by mouth at bedtime., Disp: 30 tablet, Rfl: 0 .  carboxymethylcellulose (REFRESH PLUS) 0.5 % SOLN, 1 drop 3 (three) times daily as needed., Disp: , Rfl:  .  digoxin (LANOXIN) 0.125 MG tablet, Take 1 tablet (0.125 mg total) by mouth every other day. CHF- * Hold for HR < 60, Disp: 15 tablet, Rfl: 0 .  famotidine (PEPCID) 20 MG tablet, SMARTSIG:1 Tablet(s) By Mouth Every Evening, Disp: ,  Rfl:  .  glimepiride (AMARYL) 2 MG tablet, Take 1 tablet (2 mg total) by mouth 2 (two) times daily., Disp: 60 tablet, Rfl: 0 .  isosorbide mononitrate (IMDUR) 30 MG 24 hr tablet, Take 1 tablet by mouth daily., Disp: , Rfl:  .  metoprolol succinate (TOPROL-XL) 50 MG 24 hr tablet, Take 1 tablet (50 mg total) by mouth daily., Disp: 30 tablet, Rfl: 0 .  mirtazapine (REMERON) 15 MG tablet, Take 15 mg by mouth at bedtime., Disp: , Rfl:  .  Multiple Vitamins-Minerals (PRESERVISION AREDS 2 PO), Take 1 tablet by mouth 2 (two) times daily., Disp: , Rfl:  .  mycophenolate (CELLCEPT) 500 MG tablet, Take 1 tablet (500 mg total) by mouth daily. Take 1 tablet 500mg  daily for 1 week, Take 1 tablet 500mg  every other day for 2 weeks,  then stop on week 4, Disp: 15 tablet, Rfl: 0 .  nitroGLYCERIN (NITROSTAT) 0.4 MG SL tablet, DISSOLVE ONE TABLET UNDER THE TONGUE AS NEEDED FOR CHEST PAIN. MAY REPEAT AS INDICATED BY YOUR DOCTOR., Disp: , Rfl:  .  potassium chloride (KLOR-CON) 10 MEQ tablet, Take 1 tablet by mouth in the morning and at bedtime., Disp: , Rfl:   Physical exam:  Vitals:   01/23/20 0955  BP: 134/68  Pulse: 72  Resp: 16  Temp: (!) 96 F (35.6 C)  TempSrc: Tympanic  SpO2: 100%  Weight: 96 lb (43.5 kg)   Physical Exam Constitutional:      Comments: She is sitting in a wheelchair and appears in no acute distress  Cardiovascular:     Rate and Rhythm: Normal rate and regular rhythm.     Heart sounds: Normal heart sounds.  Pulmonary:     Effort: Pulmonary effort is normal.     Breath sounds: Normal breath sounds.  Abdominal:     General: Bowel sounds are normal.     Palpations: Abdomen is soft.  Musculoskeletal:     Comments: Trace bilateral edema  Skin:    General: Skin is warm and dry.  Neurological:     Mental Status: She is alert and oriented to person, place, and time.      CMP Latest Ref Rng & Units 01/23/2020  Glucose 70 - 99 mg/dL 546(E)  BUN 8 - 23 mg/dL 14  Creatinine 7.03 -  1.00 mg/dL 5.00  Sodium 938 - 182 mmol/L 140  Potassium 3.5 -  5.1 mmol/L 3.6  Chloride 98 - 111 mmol/L 103  CO2 22 - 32 mmol/L 28  Calcium 8.9 - 10.3 mg/dL 7.8(G)  Total Protein 6.5 - 8.1 g/dL 6.6  Total Bilirubin 0.3 - 1.2 mg/dL 0.8  Alkaline Phos 38 - 126 U/L 79  AST 15 - 41 U/L 29  ALT 0 - 44 U/L 30   CBC Latest Ref Rng & Units 01/23/2020  WBC 4.0 - 10.5 K/uL 4.6  Hemoglobin 12.0 - 15.0 g/dL 11.1(L)  Hematocrit 36 - 46 % 35.9(L)  Platelets 150 - 400 K/uL 232    No images are attached to the encounter.  CT HEAD WO CONTRAST  Result Date: 01/17/2020 CLINICAL DATA:  Headache. EXAM: CT HEAD WITHOUT CONTRAST TECHNIQUE: Contiguous axial images were obtained from the base of the skull through the vertex without intravenous contrast. COMPARISON:  Aug 29, 2019 FINDINGS: Brain: Similar white matter hypoattenuation within bilateral frontal white matter. No evidence of acute large vascular territory infarct. No acute hemorrhage. No mass lesion or abnormal mass effect. Mild cerebral atrophy with ex vacuo ventricular dilation. No hydrocephalus. Vascular: Calcific atherosclerosis. Skull: No acute fracture Sinuses/Orbits: The visualized sinuses are clear. No acute orbital abnormality in the visualized orbits. Other: No mastoid effusion. IMPRESSION: 1. No evidence of acute intracranial abnormality. 2. Similar white matter hypoattenuation within bilateral frontal white matter, compatible with chronic microvascular ischemic disease and possibly small remote white matter infarcts. Electronically Signed   By: Feliberto Harts MD   On: 01/17/2020 16:07     Assessment and plan- Patient is a 86 y.o. female with prior history of warm autoimmune hemolytic anemia who was previously on CellCept and this is a routine follow-up visit  Patient small reticulocyte anemia was stable for almost a year and I asked her to come off CellCept altogether.  She has stopped taking CellCept now for the last 3 weeks.  Her  hemoglobin currently remained stable.  Haptoglobin levels are normal.I will continue to watch her anemia off CellCept at this time I will repeat a CBC CMP reticulocyte count and haptoglobin in 6 months and see her thereafter   Visit Diagnosis 1. Autoimmune hemolytic anemia (HCC)      Dr. Owens Shark, MD, MPH [facility] [phone] 01/25/2020 1:27 PM

## 2020-04-30 ENCOUNTER — Other Ambulatory Visit
Admission: RE | Admit: 2020-04-30 | Discharge: 2020-04-30 | Disposition: A | Discharge status: Home / Self Care | Payer: Medicare Other | Source: Ambulatory Visit | Attending: Cardiology | Admitting: Cardiology

## 2020-04-30 ENCOUNTER — Other Ambulatory Visit: Payer: Self-pay

## 2020-04-30 DIAGNOSIS — Z01812 Encounter for preprocedural laboratory examination: Secondary | ICD-10-CM | POA: Diagnosis present

## 2020-04-30 DIAGNOSIS — Z20822 Contact with and (suspected) exposure to covid-19: Secondary | ICD-10-CM | POA: Diagnosis not present

## 2020-04-30 LAB — SARS CORONAVIRUS 2 (TAT 6-24 HRS): SARS Coronavirus 2: NEGATIVE

## 2020-05-02 ENCOUNTER — Other Ambulatory Visit: Payer: Self-pay

## 2020-05-02 ENCOUNTER — Encounter
Admission: RE | Disposition: A | Discharge status: Home / Self Care | Payer: Self-pay | Source: Home / Self Care | Attending: Cardiology

## 2020-05-02 ENCOUNTER — Encounter: Payer: Self-pay | Admitting: Cardiology

## 2020-05-02 ENCOUNTER — Ambulatory Visit
Admission: RE | Admit: 2020-05-02 | Discharge: 2020-05-02 | Disposition: A | Discharge status: Home / Self Care | Payer: Medicare Other | Attending: Cardiology | Admitting: Cardiology

## 2020-05-02 DIAGNOSIS — Z45010 Encounter for checking and testing of cardiac pacemaker pulse generator [battery]: Secondary | ICD-10-CM | POA: Diagnosis present

## 2020-05-02 DIAGNOSIS — Z45018 Encounter for adjustment and management of other part of cardiac pacemaker: Secondary | ICD-10-CM

## 2020-05-02 HISTORY — PX: PPM GENERATOR CHANGEOUT: EP1233

## 2020-05-02 LAB — GLUCOSE, CAPILLARY: Glucose-Capillary: 199 mg/dL — ABNORMAL HIGH (ref 70–99)

## 2020-05-02 SURGERY — PPM GENERATOR CHANGEOUT
Anesthesia: Moderate Sedation

## 2020-05-02 MED ORDER — FENTANYL CITRATE (PF) 100 MCG/2ML IJ SOLN
INTRAMUSCULAR | Status: DC | PRN
  Administered 2020-05-02: 12.5 ug via INTRAVENOUS

## 2020-05-02 MED ORDER — HEPARIN (PORCINE) IN NACL 1000-0.9 UT/500ML-% IV SOLN
INTRAVENOUS | Status: DC | PRN
  Administered 2020-05-02: 500 mL

## 2020-05-02 MED ORDER — LIDOCAINE HCL (PF) 1 % IJ SOLN
INTRAMUSCULAR | Status: DC | PRN
  Administered 2020-05-02: 20 mL

## 2020-05-02 MED ORDER — HEPARIN (PORCINE) IN NACL 1000-0.9 UT/500ML-% IV SOLN
INTRAVENOUS | Status: AC
  Filled 2020-05-02: qty 1000

## 2020-05-02 MED ORDER — LIDOCAINE HCL (PF) 1 % IJ SOLN
INTRAMUSCULAR | Status: AC
  Filled 2020-05-02: qty 30

## 2020-05-02 MED ORDER — MIDAZOLAM HCL 2 MG/2ML IJ SOLN
INTRAMUSCULAR | Status: DC | PRN
  Administered 2020-05-02: 0.5 mg via INTRAVENOUS

## 2020-05-02 MED ORDER — ACETAMINOPHEN 325 MG PO TABS: 325.0000 mg | ORAL_TABLET | ORAL | Status: DC | PRN

## 2020-05-02 MED ORDER — ONDANSETRON HCL 4 MG/2ML IJ SOLN: 4.0000 mg | Freq: Four times a day (QID) | INTRAMUSCULAR | Status: DC | PRN

## 2020-05-02 MED ORDER — FENTANYL CITRATE (PF) 100 MCG/2ML IJ SOLN
INTRAMUSCULAR | Status: AC
  Filled 2020-05-02: qty 2

## 2020-05-02 MED ORDER — CEFAZOLIN SODIUM-DEXTROSE 2-4 GM/100ML-% IV SOLN
2.0000 g | INTRAVENOUS | Status: AC
  Administered 2020-05-02: 2 g via INTRAVENOUS

## 2020-05-02 MED ORDER — SODIUM CHLORIDE 0.9 % IV SOLN
80.0000 mg | INTRAVENOUS | Status: AC
Start: 2020-05-02 — End: 2020-05-02
  Administered 2020-05-02: 80 mg
  Filled 2020-05-02: qty 2

## 2020-05-02 MED ORDER — MIDAZOLAM HCL 2 MG/2ML IJ SOLN
INTRAMUSCULAR | Status: AC
  Filled 2020-05-02: qty 2

## 2020-05-02 MED ORDER — CEPHALEXIN 250 MG PO CAPS: 250.0000 mg | ORAL_CAPSULE | Freq: Two times a day (BID) | ORAL | 0 refills | Status: DC

## 2020-05-02 MED ORDER — SODIUM CHLORIDE 0.9 % IV SOLN: INTRAVENOUS | Status: DC

## 2020-05-02 SURGICAL SUPPLY — 7 items
CABLE SURG 12 DISP A/V CHANNEL (MISCELLANEOUS) ×2 IMPLANT
DEVICE DSSCT PLSMBLD 3.0S LGHT (MISCELLANEOUS) ×1 IMPLANT
IPG PACE AZUR XT SR MRI W1SR01 (Pacemaker) ×1 IMPLANT
PACE AZURE XT SR MRI W1SR01 (Pacemaker) ×2 IMPLANT
PACK PACE INSERTION (MISCELLANEOUS) ×2 IMPLANT
PAD ELECT DEFIB RADIOL ZOLL (MISCELLANEOUS) ×2 IMPLANT
PLASMABLADE 3.0S W/LIGHT (MISCELLANEOUS) ×2

## 2020-05-02 NOTE — Discharge Instructions (Signed)
Pacemaker Battery Change, Care After This sheet gives you information about how to care for yourself after your procedure. Your health care provider may also give you more specific instructions. If you have problems or questions, contact your health care provider. What can I expect after the procedure? After the procedure, it is common to have these symptoms at the site where the pacemaker was inserted:  Mild pain or soreness.  Slight bruising.  Some swelling over the incisions.  A slight bump over the skin where the device was placed (if it was implanted in the upper chest area). Sometimes, it is possible to feel the device under the skin. This is normal. Follow these instructions at home: Incision care  Keep the incision clean and dry for 2-3 days after the procedure or as told by your health care provider. It takes several weeks for the incision site to completely heal.  Do not remove the bandage (dressing) on your chest until told to do so by your health care provider.  Leave stitches (sutures), skin glue, or adhesive strips in place. These skin closures may need to stay in place for 2 weeks or longer. If adhesive strip edges start to loosen and curl up, you may trim the loose edges. Do not remove adhesive strips completely unless your health care provider tells you to do that.  Do not take baths, swim, or use a hot tub for 7-10 days or until your health care provider approves. Ask your health care provider if you may take showers. You may only be allowed to take sponge baths.  Pat the incision area dry with a clean towel. Do not rub the area. This may cause bleeding.  Check your incision area every day for signs of infection. Check for: ? More redness, swelling, or pain. ? Fluid or blood. ? Warmth. ? Pus or a bad smell.  Avoid putting pressure on the area where the pacemaker was placed. Women may want to place a small pad over the incision site to protect it from their bra strap.    Medicines  Take over-the-counter and prescription medicines only as told by your health care provider.  If you were prescribed an antibiotic medicine, take it as told by your health care provider. Do not stop taking the antibiotic even if you start to feel better. Activity  For the first 2 weeks, or as long as told by your health care provider: ? Avoid lifting your left arm higher than your shoulder. ? Be gentle when you move your arms over your head. It is okay to raise your arm to comb your hair. ? Avoid exercise or activities that take a lot of effort.  Ask your health care provider when it is okay to: ? Return to your normal activities. ? Return to work or school. ? Resume sexual activity.  If you were given a medicine to help you relax (sedative) during the procedure, it can affect you for several hours. Do not drive or operate machinery until your health care provider says that it is safe. General instructions  Do not use any products that contain nicotine or tobacco, such as cigarettes, e-cigarettes, and chewing tobacco. These can delay incision healing after surgery. If you need help quitting, ask your health care provider.  Always let all health care providers, including dentists, know about your pacemaker before you have any medical procedures or tests.  You may be shown how to transfer data from your pacemaker through the phone to your  health care provider.  Wear a medical ID bracelet or necklace stating that you have a pacemaker, and carry a pacemaker ID card with you at all times.  Avoid close and prolonged exposure to electrical devices that have strong magnetic fields. These include: ? Airport Data processing manager. When at the airport, let officials know that you have a pacemaker. Carry your pacemaker ID card. ? Metal detectors. If you must pass through a metal detector, walk through it quickly. Do not stop under the detector or stand near it.  When using your mobile  phone, hold it to the ear opposite the pacemaker. Do not leave your mobile phone in a pocket over the pacemaker.  Your pacemaker battery will last for 5-15 years. Your health care provider will do routine checks to know when the battery is starting to run down. When this happens, the pacemaker will need to be replaced.  Keep all follow-up visits as told by your health care provider. This is important. Contact a health care provider if:  You have pain at the incision site that is not relieved by medicines.  You have any of these signs of infection: ? More redness, swelling, or pain around your incision. ? Fluid or blood coming from your incision. ? Warmth coming from your incision. ? Pus or a bad smell coming from your incision. ? A fever.  You feel brief, occasional palpitations, light-headedness, or any symptoms that you think might be related to your heart. Get help right away if:  You have chest pain that is different from the pain at the pacemaker site.  You develop a red streak that extends above or below the incision site.  You have shortness of breath.  You have palpitations or an irregular heartbeat.  You have light-headedness that does not go away quickly.  You faint or have dizzy spells.  Your pulse suddenly drops or increases rapidly and does not return to normal.  You gain weight and your legs and ankles swell. Summary  After the procedure, it is common to have pain, soreness, and some swelling or bruising where the pacemaker was inserted.  Keep your incision clean and dry. Follow instructions from your health care provider about how to take care of your incision.  Check your incision every day for signs of infection, such as more pain or swelling, pus or a bad smell, warmth, or leaking fluid or blood.  Carry a pacemaker ID card with you at all times. This information is not intended to replace advice given to you by your health care provider. Make sure you  discuss any questions you have with your health care provider. Document Revised: 02/23/2019 Document Reviewed: 02/23/2019 Elsevier Patient Education  2021 [place]. Moderate Conscious Sedation, Adult, Care After This sheet gives you information about how to care for yourself after your procedure. Your health care provider may also give you more specific instructions. If you have problems or questions, contact your health care provider. What can I expect after the procedure? After the procedure, it is common to have:  Sleepiness for several hours.  Impaired judgment for several hours.  Difficulty with balance.  Vomiting if you eat too soon. Follow these instructions at home: For the time period you were told by your health care provider:  Rest.  Do not participate in activities where you could fall or become injured.  Do not drive or use machinery.  Do not drink alcohol.  Do not take sleeping pills or medicines that cause  drowsiness.  Do not make important decisions or sign legal documents.  Do not take care of children on your own.      Eating and drinking  Follow the diet recommended by your health care provider.  Drink enough fluid to keep your urine pale yellow.  If you vomit: ? Drink water, juice, or soup when you can drink without vomiting. ? Make sure you have little or no nausea before eating solid foods.   General instructions  Take over-the-counter and prescription medicines only as told by your health care provider.  Have a responsible adult stay with you for the time you are told. It is important to have someone help care for you until you are awake and alert.  Do not smoke.  Keep all follow-up visits as told by your health care provider. This is important. Contact a health care provider if:  You are still sleepy or having trouble with balance after 24 hours.  You feel light-headed.  You keep feeling nauseous or you keep vomiting.  You  develop a rash.  You have a fever.  You have redness or swelling around the IV site. Get help right away if:  You have trouble breathing.  You have new-onset confusion at home. Summary  After the procedure, it is common to feel sleepy, have impaired judgment, or feel nauseous if you eat too soon.  Rest after you get home. Know the things you should not do after the procedure.  Follow the diet recommended by your health care provider and drink enough fluid to keep your urine pale yellow.  Get help right away if you have trouble breathing or new-onset confusion at home. This information is not intended to replace advice given to you by your health care provider. Make sure you discuss any questions you have with your health care provider. Document Revised: 07/21/2019 Document Reviewed: 02/16/2019 Elsevier Patient Education  2021 [place] on 05/03/2020.  May remove outer bandage on 05/03/2020, leave Steri-Strips on.  Patient may shower on 05/03/2020.

## 2020-05-02 NOTE — Op Note (Signed)
[facility]   05/02/2020                     8:47 AM  PATIENT:  Elizabeth Novak    PRE-OPERATIVE DIAGNOSIS:  PPM Generator Change  Single Chamber change out   Pacemaker end of life MedTronic Rep  POST-OPERATIVE DIAGNOSIS:  Same  PROCEDURE:  PPM GENERATOR CHANGEOUT  SURGEON:  Isaias Cowman, MD    ANESTHESIA:     PREOPERATIVE INDICATIONS:  [name] is a  87 y.o. female with a diagnosis of PPM Generator Change  Single Chamber change out   Pacemaker end of life MedTronic Rep who failed conservative measures and elected for surgical management.    The risks benefits and alternatives were discussed with the patient preoperatively including but not limited to the risks of infection, bleeding, cardiopulmonary complications, the need for revision surgery, among others, and the patient was willing to proceed.   OPERATIVE PROCEDURE: The patient was brought to the operating room in a fasting state.  The left pectoral region was prepped and draped in usual sterile manner.  Anesthesia was then 1% lidocaine locally.  A 6  centimeter incision was performed over the existing pacemaker site.  The existing pacemaker generator was retrieved electrocautery and blunt dissection.  The RV lead (Guidant [id number]) was disconnected and connected to a new MRI compatible single-chamber pacemaker generator (Medtronic Azure XT SR MRI [id number]).  Pacemaker pocket was irrigated with gentamicin solution.  The new pacemaker generator was positioned into the pocket pocket was closed with 2-0 and 4-0 Vicryl, respectively.  Steri-Strips and pressure dressing were applied.  There were no periprocedural complications.  Postprocedural interrogation revealed appropriate ventricular sensing and pacing thresholds.

## 2020-07-23 ENCOUNTER — Inpatient Hospital Stay (HOSPITAL_BASED_OUTPATIENT_CLINIC_OR_DEPARTMENT_OTHER): Payer: Medicare Other | Admitting: Oncology

## 2020-07-23 ENCOUNTER — Inpatient Hospital Stay: Payer: Medicare Other | Attending: Oncology

## 2020-07-23 ENCOUNTER — Other Ambulatory Visit: Payer: Self-pay

## 2020-07-23 VITALS — BP 152/66 | HR 61 | Temp 96.0°F | Resp 20 | Wt 93.0 lb

## 2020-07-23 DIAGNOSIS — Z9012 Acquired absence of left breast and nipple: Secondary | ICD-10-CM | POA: Diagnosis not present

## 2020-07-23 DIAGNOSIS — D649 Anemia, unspecified: Secondary | ICD-10-CM | POA: Diagnosis not present

## 2020-07-23 DIAGNOSIS — I10 Essential (primary) hypertension: Secondary | ICD-10-CM | POA: Insufficient documentation

## 2020-07-23 DIAGNOSIS — I4891 Unspecified atrial fibrillation: Secondary | ICD-10-CM | POA: Diagnosis not present

## 2020-07-23 DIAGNOSIS — D5911 Warm autoimmune hemolytic anemia: Secondary | ICD-10-CM

## 2020-07-23 DIAGNOSIS — E119 Type 2 diabetes mellitus without complications: Secondary | ICD-10-CM | POA: Insufficient documentation

## 2020-07-23 DIAGNOSIS — Z7984 Long term (current) use of oral hypoglycemic drugs: Secondary | ICD-10-CM | POA: Diagnosis not present

## 2020-07-23 DIAGNOSIS — I2510 Atherosclerotic heart disease of native coronary artery without angina pectoris: Secondary | ICD-10-CM | POA: Diagnosis not present

## 2020-07-23 DIAGNOSIS — Z7901 Long term (current) use of anticoagulants: Secondary | ICD-10-CM | POA: Insufficient documentation

## 2020-07-23 DIAGNOSIS — Z79899 Other long term (current) drug therapy: Secondary | ICD-10-CM | POA: Insufficient documentation

## 2020-07-23 DIAGNOSIS — E785 Hyperlipidemia, unspecified: Secondary | ICD-10-CM | POA: Diagnosis not present

## 2020-07-23 DIAGNOSIS — D591 Autoimmune hemolytic anemia, unspecified: Secondary | ICD-10-CM

## 2020-07-23 DIAGNOSIS — I341 Nonrheumatic mitral (valve) prolapse: Secondary | ICD-10-CM | POA: Diagnosis not present

## 2020-07-23 LAB — CBC WITH DIFFERENTIAL/PLATELET
Abs Immature Granulocytes: 0.03 10*3/uL (ref 0.00–0.07)
Basophils Absolute: 0 10*3/uL (ref 0.0–0.1)
Basophils Relative: 1 %
Eosinophils Absolute: 0.1 10*3/uL (ref 0.0–0.5)
Eosinophils Relative: 2 %
HCT: 37.1 % (ref 36.0–46.0)
Hemoglobin: 11.7 g/dL — ABNORMAL LOW (ref 12.0–15.0)
Immature Granulocytes: 1 %
Lymphocytes Relative: 18 %
Lymphs Abs: 0.8 10*3/uL (ref 0.7–4.0)
MCH: 27.1 pg (ref 26.0–34.0)
MCHC: 31.5 g/dL (ref 30.0–36.0)
MCV: 86.1 fL (ref 80.0–100.0)
Monocytes Absolute: 0.4 10*3/uL (ref 0.1–1.0)
Monocytes Relative: 9 %
Neutro Abs: 3 10*3/uL (ref 1.7–7.7)
Neutrophils Relative %: 69 %
Platelets: 205 10*3/uL (ref 150–400)
RBC: 4.31 MIL/uL (ref 3.87–5.11)
RDW: 14.4 % (ref 11.5–15.5)
WBC: 4.3 10*3/uL (ref 4.0–10.5)
nRBC: 0 % (ref 0.0–0.2)

## 2020-07-23 LAB — RETICULOCYTES
Immature Retic Fract: 11.6 % (ref 2.3–15.9)
RBC.: 4.3 MIL/uL (ref 3.87–5.11)
Retic Count, Absolute: 52.9 10*3/uL (ref 19.0–186.0)
Retic Ct Pct: 1.2 % (ref 0.4–3.1)

## 2020-07-23 LAB — COMPREHENSIVE METABOLIC PANEL
ALT: 32 U/L (ref 0–44)
AST: 33 U/L (ref 15–41)
Albumin: 3.7 g/dL (ref 3.5–5.0)
Alkaline Phosphatase: 83 U/L (ref 38–126)
Anion gap: 6 (ref 5–15)
BUN: 19 mg/dL (ref 8–23)
CO2: 29 mmol/L (ref 22–32)
Calcium: 9.3 mg/dL (ref 8.9–10.3)
Chloride: 105 mmol/L (ref 98–111)
Creatinine, Ser: 0.79 mg/dL (ref 0.44–1.00)
GFR, Estimated: >60 mL/min (ref >60)
Glucose, Bld: 300 mg/dL — ABNORMAL HIGH (ref 70–99)
Potassium: 4.2 mmol/L (ref 3.5–5.1)
Sodium: 140 mmol/L (ref 135–145)
Total Bilirubin: 1 mg/dL (ref 0.3–1.2)
Total Protein: 6.5 g/dL (ref 6.5–8.1)

## 2020-07-24 ENCOUNTER — Encounter: Payer: Self-pay | Admitting: Oncology

## 2020-07-24 LAB — HAPTOGLOBIN: Haptoglobin: 102 mg/dL (ref 41–333)

## 2020-07-24 NOTE — Progress Notes (Signed)
Hematology/Oncology Consult note [facility]  Telephone:([phone] [phone]  Patient Care Team: Danella Penton, MD as PCP - General (Internal Medicine) Danella Penton, MD (Internal Medicine) Lemar Livings Merrily Pew, MD (General Surgery) Dalia Heading, MD as Consulting Physician (Cardiology)   Name of the patient: Elizabeth Novak  [id number]  [birth year]   Date of visit: 07/24/20  Diagnosis-history of warm autoimmune hemolytic anemia  Chief complaint/ Reason for visit-routine follow-up of autoimmune hemolytic anemia  Heme/Onc history: Patient is a 87 year old female with a past medical history significant for hypertension, type 2 diabetes, CAD, hyperlipidemia, history of A. fib on Coumadin, sick sinus syndrome status post pacemaker placement and history of breast cancer status post left mastectomy in the past. She was admitted to [facility] on 04/18/2018 with symptoms of right-sided weakness and expressive a aphasia. She was treated for an acute left MCA ischemic stroke. She was started on apixaban 2.5 mg twice daily and aspirin/Coumadinwas discontinued. During that admission she was also found to have a hemoglobin of 7. Prior to that she had her last normal hemoglobin sometime in June 2019 when it was 13 and then gradually drifted down to 9 there wasalso some concern for GI bleed and she had seen [facility] in the past. Her hemoglobin did improve back up to 13 in August 2019 and remained stable between 12-13 until November 2019  During her [facility] hospitalization patient was diagnosed with warm autoimmune hemolytic anemia. Hematology was consulted and given her recent stroke, underlying diabetes-steroids was not offered as for first-line treatment and she was started on CellCept instead 500 mg twice daily she was supposed to follow-up with [facility] hematology as an outpatient but preferred to get care locally at [facility].CT chest abdomen and  pelvis did not reveal any evidence of malignancy.Multiple myeloma panel did not reveal any M protein. Serum free light chain ratio was abnormal at 2.18. Patient has responded to mycophenolate and her hemoglobin has been between 11-12 since September 2020  Patient has stopped taking CellCept since September 2021 with stable hemoglobin  Interval history-patient reports doing well for her age.  She has a daytime caregiver but lives alone at night.  She has not had any recent falls or hospitalizations.  ECOG PS- 2 Pain scale- 0   Review of systems- Review of Systems  Constitutional: Positive for malaise/fatigue. Negative for chills, fever and weight loss.  HENT: Negative for congestion, ear discharge and nosebleeds.   Eyes: Negative for blurred vision.  Respiratory: Negative for cough, hemoptysis, sputum production, shortness of breath and wheezing.   Cardiovascular: Positive for leg swelling. Negative for chest pain, palpitations, orthopnea and claudication.  Gastrointestinal: Negative for abdominal pain, blood in stool, constipation, diarrhea, heartburn, melena, nausea and vomiting.  Genitourinary: Negative for dysuria, flank pain, frequency, hematuria and urgency.  Musculoskeletal: Negative for back pain, joint pain and myalgias.  Skin: Negative for rash.  Neurological: Negative for dizziness, tingling, focal weakness, seizures, weakness and headaches.  Endo/Heme/Allergies: Does not bruise/bleed easily.  Psychiatric/Behavioral: Negative for depression and suicidal ideas. The patient does not have insomnia.       Allergies  Allergen Reactions  . Codeine Nausea Only  . Disopyramide     Other reaction(s): Unknown  . Ibuprofen Diarrhea  . Iodine     blisters  . Metformin And Related Other (See Comments)    unknown  . Norpace [Disopyramide Phosphate]   . Quinidine     Other reaction(s): Unknown  .  Terfenadine     Other reaction(s): Unknown  . Topiramate     Other reaction(s):  Other (See Comments) Hair loss  . Verapamil     Other reaction(s): Unknown  . Dexamethasone Sodium Phosphate Palpitations     Past Medical History:  Diagnosis Date  . A-fib (HCC)   . Anemia   . Arthritis   . Breast cancer (HCC) 1978   left breast with lymph node removal  . CHF (congestive heart failure) (HCC)   . Diabetes mellitus without complication (HCC)   . Diverticulitis   . Dysrhythmia   . GERD (gastroesophageal reflux disease)   . Hyperlipidemia   . Macular degeneration of both eyes   . Mitral valve prolapse   . Mitral valve regurgitation   . Presence of permanent cardiac pacemaker      Past Surgical History:  Procedure Laterality Date  . ABDOMINAL HYSTERECTOMY    . APPENDECTOMY    . BREAST SURGERY    . CARDIAC CATHETERIZATION    . ESOPHAGEAL DILATION    . EYE SURGERY Bilateral    Cataract Extraction with IOL  . INSERT / REPLACE / REMOVE PACEMAKER    . IRRIGATION AND DEBRIDEMENT HEMATOMA Left 08/21/2015   Procedure: IRRIGATION AND DEBRIDEMENT HEMATOMA;  Surgeon: Earline Mayotte, MD;  Location: [facility] ORS;  Service: General;  Laterality: Left;  . KNEE ARTHROSCOPY Right   . LEFT OOPHORECTOMY Left   . MASTECTOMY Left 1978  . MASTECTOMY Right 1978  . OPEN REDUCTION INTERNAL FIXATION (ORIF) DISTAL RADIAL FRACTURE Right 02/14/2018   Procedure: OPEN REDUCTION INTERNAL FIXATION (ORIF) DISTAL RADIAL FRACTURE;  Surgeon: Kennedy Bucker, MD;  Location: [facility] ORS;  Service: Orthopedics;  Laterality: Right;  . PACEMAKER INSERTION  08/11/12  . PPM GENERATOR CHANGEOUT N/A 05/02/2020   Procedure: PPM GENERATOR CHANGEOUT;  Surgeon: Marcina Millard, MD;  Location: [facility] INVASIVE CV LAB;  Service: Cardiovascular;  Laterality: N/A;  . TEMPORAL ARTERY BIOPSY / LIGATION    . TONSILLECTOMY      Social History   Socioeconomic History  . Marital status: Widowed    Spouse name: Not on file  . Number of children: 1  . Years of education: 72  . Highest education level: 11th  grade  Occupational History  . Not on file  Tobacco Use  . Smoking status: Never Smoker  . Smokeless tobacco: Never Used  Vaping Use  . Vaping Use: Never used  Substance and Sexual Activity  . Alcohol use: No    Alcohol/week: 0.0 standard drinks  . Drug use: No  . Sexual activity: Not on file  Other Topics Concern  . Not on file  Social History Narrative   Widowed   1 son   Never smoker or smokeless tobacco user   No alcohol use   Full Code   Social Determinants of Health   Financial Resource Strain: Not on file  Food Insecurity: Not on file  Transportation Needs: Not on file  Physical Activity: Not on file  Stress: Not on file  Social Connections: Not on file  Intimate Partner Violence: Not on file    Family History  Problem Relation Age of Onset  . Hypertension Mother      Current Outpatient Medications:  .  apixaban (ELIQUIS) 2.5 MG TABS tablet, Take 1 tablet (2.5 mg total) by mouth 2 (two) times daily., Disp: 60 tablet, Rfl: 0 .  atorvastatin (LIPITOR) 20 MG tablet, Take 1 tablet (20 mg total) by mouth at bedtime., Disp: 30  tablet, Rfl: 0 .  carboxymethylcellulose (REFRESH PLUS) 0.5 % SOLN, 1 drop 3 (three) times daily as needed., Disp: , Rfl:  .  digoxin (LANOXIN) 0.125 MG tablet, Take 1 tablet (0.125 mg total) by mouth every other day. CHF- * Hold for HR < 60, Disp: 15 tablet, Rfl: 0 .  famotidine (PEPCID) 20 MG tablet, Take 20 mg by mouth at bedtime., Disp: , Rfl:  .  glipiZIDE (GLUCOTROL XL) 10 MG 24 hr tablet, Take 10 mg by mouth daily with breakfast., Disp: , Rfl:  .  isosorbide mononitrate (IMDUR) 30 MG 24 hr tablet, Take 30 mg by mouth daily., Disp: , Rfl:  .  metoprolol succinate (TOPROL-XL) 50 MG 24 hr tablet, Take 1 tablet (50 mg total) by mouth daily., Disp: 30 tablet, Rfl: 0 .  mirtazapine (REMERON) 15 MG tablet, Take 7.5 mg by mouth at bedtime., Disp: , Rfl:  .  Multiple Vitamins-Minerals (PRESERVISION AREDS 2 PO), Take 1 tablet by mouth 2 (two)  times daily., Disp: , Rfl:  .  nitroGLYCERIN (NITROSTAT) 0.4 MG SL tablet, Place 0.4 mg under the tongue every 5 (five) minutes as needed for chest pain., Disp: , Rfl:  .  omeprazole (PRILOSEC) 20 MG capsule, Take 20 mg by mouth 2 (two) times daily., Disp: , Rfl:  .  potassium chloride (KLOR-CON) 10 MEQ tablet, Take 40 mEq by mouth daily., Disp: , Rfl:  .  torsemide (DEMADEX) 20 MG tablet, Take 20 mg by mouth 2 (two) times daily., Disp: , Rfl:   Physical exam:  Vitals:   07/23/20 1107  BP: (!) 152/66  Pulse: 61  Resp: 20  Temp: (!) 96 F (35.6 C)  TempSrc: Tympanic  SpO2: 100%  Weight: 93 lb (42.2 kg)   Physical Exam Constitutional:      General: She is not in acute distress.    Comments: She is sitting in a wheelchair and appears in no acute distress  Cardiovascular:     Rate and Rhythm: Normal rate and regular rhythm.     Heart sounds: Normal heart sounds.  Pulmonary:     Effort: Pulmonary effort is normal.     Breath sounds: Normal breath sounds.  Musculoskeletal:     Right lower leg: Edema present.     Left lower leg: Edema present.  Skin:    General: Skin is warm and dry.  Neurological:     Mental Status: She is alert and oriented to person, place, and time.      CMP Latest Ref Rng & Units 07/23/2020  Glucose 70 - 99 mg/dL 696(E)  BUN 8 - 23 mg/dL 19  Creatinine 9.52 - 8.41 mg/dL 3.24  Sodium 401 - 027 mmol/L 140  Potassium 3.5 - 5.1 mmol/L 4.2  Chloride 98 - 111 mmol/L 105  CO2 22 - 32 mmol/L 29  Calcium 8.9 - 10.3 mg/dL 9.3  Total Protein 6.5 - 8.1 g/dL 6.5  Total Bilirubin 0.3 - 1.2 mg/dL 1.0  Alkaline Phos 38 - 126 U/L 83  AST 15 - 41 U/L 33  ALT 0 - 44 U/L 32   CBC Latest Ref Rng & Units 07/23/2020  WBC 4.0 - 10.5 K/uL 4.3  Hemoglobin 12.0 - 15.0 g/dL 11.7(L)  Hematocrit 36.0 - 46.0 % 37.1  Platelets 150 - 400 K/uL 205    Assessment and plan- Patient is a 87 y.o. female with history of warm autoimmune hemolytic anemia was previously on CellCept here  for routine follow-up  Patient's hemoglobin is  currently stable between 10-11 and after stopping CellCept in September 2021.  Haptoglobin is currently normal with a normal reticulocyte count and therefore there is no evidence of hemolysis.  She will continue to remain off CellCept at this time.  Given her age and frailty it would be okay for patient to follow-up with Dr. Hyacinth Meeker at this time with a CBC monitored every 6 months.  If there is any evidence of worsening anemia she can be referred to [place] in the future   Visit Diagnosis 1. Warm autoimmune hemolytic anemia (HCC)      Dr. Owens Shark, MD, MPH [facility] [phone] 07/24/2020 10:34 AM

## 2021-01-06 ENCOUNTER — Emergency Department: Payer: Medicare Other

## 2021-01-06 ENCOUNTER — Emergency Department
Admission: EM | Admit: 2021-01-06 | Discharge: 2021-01-07 | Disposition: A | Discharge status: Home / Self Care | Payer: Medicare Other | Attending: Emergency Medicine | Admitting: Emergency Medicine

## 2021-01-06 ENCOUNTER — Other Ambulatory Visit: Payer: Self-pay

## 2021-01-06 DIAGNOSIS — I110 Hypertensive heart disease with heart failure: Secondary | ICD-10-CM | POA: Insufficient documentation

## 2021-01-06 DIAGNOSIS — I2510 Atherosclerotic heart disease of native coronary artery without angina pectoris: Secondary | ICD-10-CM | POA: Insufficient documentation

## 2021-01-06 DIAGNOSIS — I10 Essential (primary) hypertension: Secondary | ICD-10-CM

## 2021-01-06 DIAGNOSIS — Z7901 Long term (current) use of anticoagulants: Secondary | ICD-10-CM | POA: Diagnosis not present

## 2021-01-06 DIAGNOSIS — Z79899 Other long term (current) drug therapy: Secondary | ICD-10-CM | POA: Diagnosis not present

## 2021-01-06 DIAGNOSIS — I5032 Chronic diastolic (congestive) heart failure: Secondary | ICD-10-CM | POA: Diagnosis not present

## 2021-01-06 DIAGNOSIS — Z7984 Long term (current) use of oral hypoglycemic drugs: Secondary | ICD-10-CM | POA: Diagnosis not present

## 2021-01-06 DIAGNOSIS — Z853 Personal history of malignant neoplasm of breast: Secondary | ICD-10-CM | POA: Insufficient documentation

## 2021-01-06 DIAGNOSIS — R531 Weakness: Secondary | ICD-10-CM | POA: Diagnosis not present

## 2021-01-06 LAB — CBC WITH DIFFERENTIAL/PLATELET
Abs Immature Granulocytes: 0.02 10*3/uL (ref 0.00–0.07)
Basophils Absolute: 0 10*3/uL (ref 0.0–0.1)
Basophils Relative: 1 %
Eosinophils Absolute: 0 10*3/uL (ref 0.0–0.5)
Eosinophils Relative: 0 %
HCT: 36.1 % (ref 36.0–46.0)
Hemoglobin: 11.6 g/dL — ABNORMAL LOW (ref 12.0–15.0)
Immature Granulocytes: 0 %
Lymphocytes Relative: 14 %
Lymphs Abs: 0.8 10*3/uL (ref 0.7–4.0)
MCH: 27 pg (ref 26.0–34.0)
MCHC: 32.1 g/dL (ref 30.0–36.0)
MCV: 84.1 fL (ref 80.0–100.0)
Monocytes Absolute: 0.6 10*3/uL (ref 0.1–1.0)
Monocytes Relative: 10 %
Neutro Abs: 4.6 10*3/uL (ref 1.7–7.7)
Neutrophils Relative %: 75 %
Platelets: 202 10*3/uL (ref 150–400)
RBC: 4.29 MIL/uL (ref 3.87–5.11)
RDW: 14.8 % (ref 11.5–15.5)
WBC: 6.1 10*3/uL (ref 4.0–10.5)
nRBC: 0 % (ref 0.0–0.2)

## 2021-01-06 LAB — URINALYSIS, COMPLETE (UACMP) WITH MICROSCOPIC
Bacteria, UA: NONE SEEN
Bilirubin Urine: NEGATIVE
Glucose, UA: >=500 mg/dL — AB
Hgb urine dipstick: NEGATIVE
Ketones, ur: NEGATIVE mg/dL
Leukocytes,Ua: NEGATIVE
Nitrite: NEGATIVE
Protein, ur: 30 mg/dL — AB
Specific Gravity, Urine: 1.02 (ref 1.005–1.030)
pH: 6 (ref 5.0–8.0)

## 2021-01-06 MED ORDER — LABETALOL HCL 5 MG/ML IV SOLN: 10.0000 mg | Freq: Once | INTRAVENOUS | Status: DC

## 2021-01-06 NOTE — ED Notes (Signed)
Patient taken to CT at this time.
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
## 2021-01-06 NOTE — ED Triage Notes (Signed)
Patient BIB EMS for evaluation of hypertension.  Patient states "I just do not feel well tonight."  Hx of MI and CVA x 2.  No weakness noted at this time.

## 2021-01-06 NOTE — ED Provider Notes (Signed)
  Physical Exam  BP (!) 177/69   Pulse 68   Temp 98.1 F (36.7 C) (Oral)   Resp 16   SpO2 100%   Physical Exam  ED Course/Procedures     Procedures  MDM  11:00 PM  Assumed care at shift change.  Patient is a 87 year old female who presents to the emergency department EMS stating that she did not feel well.  Found to be hypertensive.  No other specific complaints.  CT head unremarkable.  EKG nonischemic.  Labs, urine pending.  12:40 AM  Pt's labs and urine are unremarkable.  Blood pressure has come down on its own to 153/67 without any intervention.  Awaiting pacemaker report.  1:49 AM  Rcvd [employer] report.  Device appears to be functioning appropriately as programmed.  No arrhythmias/high rate episodes detected since last interrogation on November 05, 2020.  2:18 AM  Pt's nurse reports occasional PVCs seen on cardiac monitoring.  Checked her magnesium and potassium levels today which were normal.  Continues to be well-appearing, asymptomatic.  I feel she is safe for discharge home with outpatient follow-up with her primary care doctor.     At this time, I do not feel there is any life-threatening condition present. I have reviewed, interpreted and discussed all results (EKG, imaging, lab, urine as appropriate) and exam findings with patient/family. I have reviewed nursing notes and appropriate previous records.  I feel the patient is safe to be discharged home without further emergent workup and can continue workup as an outpatient as needed. Discussed usual and customary return precautions. Patient/family verbalize understanding and are comfortable with this plan.  Outpatient follow-up has been provided as needed. All questions have been answered.       Christo Hain, Delice Bison, DO 01/07/21 [phone]

## 2021-01-07 LAB — COMPREHENSIVE METABOLIC PANEL
ALT: 28 U/L (ref 0–44)
AST: 33 U/L (ref 15–41)
Albumin: 3.7 g/dL (ref 3.5–5.0)
Alkaline Phosphatase: 68 U/L (ref 38–126)
Anion gap: 6 (ref 5–15)
BUN: 21 mg/dL (ref 8–23)
CO2: 30 mmol/L (ref 22–32)
Calcium: 9.7 mg/dL (ref 8.9–10.3)
Chloride: 103 mmol/L (ref 98–111)
Creatinine, Ser: 0.71 mg/dL (ref 0.44–1.00)
GFR, Estimated: >60 mL/min (ref >60)
Glucose, Bld: 283 mg/dL — ABNORMAL HIGH (ref 70–99)
Potassium: 4.2 mmol/L (ref 3.5–5.1)
Sodium: 139 mmol/L (ref 135–145)
Total Bilirubin: 1 mg/dL (ref 0.3–1.2)
Total Protein: 6.5 g/dL (ref 6.5–8.1)

## 2021-01-07 LAB — MAGNESIUM: Magnesium: 2.3 mg/dL (ref 1.7–2.4)

## 2021-01-07 LAB — TROPONIN I (HIGH SENSITIVITY): Troponin I (High Sensitivity): 11 ng/L (ref <18)

## 2021-01-07 NOTE — ED Notes (Signed)
Patient calling family for a ride home

## 2021-01-07 NOTE — ED Notes (Signed)
Patient assisted to restroom. Patient has called care taker to come get her for discharge

## 2021-03-21 ENCOUNTER — Other Ambulatory Visit: Payer: Self-pay

## 2021-03-21 DIAGNOSIS — I2510 Atherosclerotic heart disease of native coronary artery without angina pectoris: Secondary | ICD-10-CM | POA: Diagnosis present

## 2021-03-21 DIAGNOSIS — R54 Age-related physical debility: Secondary | ICD-10-CM | POA: Diagnosis present

## 2021-03-21 DIAGNOSIS — J1282 Pneumonia due to coronavirus disease 2019: Secondary | ICD-10-CM | POA: Diagnosis present

## 2021-03-21 DIAGNOSIS — R008 Other abnormalities of heart beat: Secondary | ICD-10-CM | POA: Diagnosis present

## 2021-03-21 DIAGNOSIS — Z886 Allergy status to analgesic agent status: Secondary | ICD-10-CM

## 2021-03-21 DIAGNOSIS — Z888 Allergy status to other drugs, medicaments and biological substances status: Secondary | ICD-10-CM

## 2021-03-21 DIAGNOSIS — Z8679 Personal history of other diseases of the circulatory system: Secondary | ICD-10-CM

## 2021-03-21 DIAGNOSIS — Z8249 Family history of ischemic heart disease and other diseases of the circulatory system: Secondary | ICD-10-CM

## 2021-03-21 DIAGNOSIS — I480 Paroxysmal atrial fibrillation: Secondary | ICD-10-CM | POA: Diagnosis present

## 2021-03-21 DIAGNOSIS — Z950 Presence of cardiac pacemaker: Secondary | ICD-10-CM

## 2021-03-21 DIAGNOSIS — Z79899 Other long term (current) drug therapy: Secondary | ICD-10-CM

## 2021-03-21 DIAGNOSIS — E785 Hyperlipidemia, unspecified: Secondary | ICD-10-CM | POA: Diagnosis present

## 2021-03-21 DIAGNOSIS — H3530 Unspecified macular degeneration: Secondary | ICD-10-CM | POA: Diagnosis present

## 2021-03-21 DIAGNOSIS — R600 Localized edema: Secondary | ICD-10-CM | POA: Diagnosis present

## 2021-03-21 DIAGNOSIS — E119 Type 2 diabetes mellitus without complications: Secondary | ICD-10-CM | POA: Diagnosis present

## 2021-03-21 DIAGNOSIS — D509 Iron deficiency anemia, unspecified: Secondary | ICD-10-CM | POA: Diagnosis present

## 2021-03-21 DIAGNOSIS — H9190 Unspecified hearing loss, unspecified ear: Secondary | ICD-10-CM | POA: Diagnosis present

## 2021-03-21 DIAGNOSIS — K921 Melena: Principal | ICD-10-CM | POA: Diagnosis present

## 2021-03-21 DIAGNOSIS — Z90721 Acquired absence of ovaries, unilateral: Secondary | ICD-10-CM

## 2021-03-21 DIAGNOSIS — M1990 Unspecified osteoarthritis, unspecified site: Secondary | ICD-10-CM | POA: Diagnosis present

## 2021-03-21 DIAGNOSIS — D62 Acute posthemorrhagic anemia: Secondary | ICD-10-CM | POA: Diagnosis present

## 2021-03-21 DIAGNOSIS — Z9013 Acquired absence of bilateral breasts and nipples: Secondary | ICD-10-CM

## 2021-03-21 DIAGNOSIS — K922 Gastrointestinal hemorrhage, unspecified: Secondary | ICD-10-CM | POA: Diagnosis not present

## 2021-03-21 DIAGNOSIS — Z7901 Long term (current) use of anticoagulants: Secondary | ICD-10-CM

## 2021-03-21 DIAGNOSIS — Z8673 Personal history of transient ischemic attack (TIA), and cerebral infarction without residual deficits: Secondary | ICD-10-CM

## 2021-03-21 DIAGNOSIS — U071 COVID-19: Secondary | ICD-10-CM | POA: Diagnosis present

## 2021-03-21 DIAGNOSIS — Z7984 Long term (current) use of oral hypoglycemic drugs: Secondary | ICD-10-CM

## 2021-03-21 DIAGNOSIS — K219 Gastro-esophageal reflux disease without esophagitis: Secondary | ICD-10-CM | POA: Diagnosis present

## 2021-03-21 DIAGNOSIS — I10 Essential (primary) hypertension: Secondary | ICD-10-CM | POA: Diagnosis present

## 2021-03-21 DIAGNOSIS — Z90710 Acquired absence of both cervix and uterus: Secondary | ICD-10-CM

## 2021-03-21 DIAGNOSIS — Z853 Personal history of malignant neoplasm of breast: Secondary | ICD-10-CM

## 2021-03-21 DIAGNOSIS — I452 Bifascicular block: Secondary | ICD-10-CM | POA: Diagnosis present

## 2021-03-21 DIAGNOSIS — I495 Sick sinus syndrome: Secondary | ICD-10-CM | POA: Diagnosis present

## 2021-03-21 DIAGNOSIS — Z885 Allergy status to narcotic agent status: Secondary | ICD-10-CM

## 2021-03-21 LAB — COMPREHENSIVE METABOLIC PANEL
ALT: 17 U/L (ref 0–44)
AST: 20 U/L (ref 15–41)
Albumin: 3.3 g/dL — ABNORMAL LOW (ref 3.5–5.0)
Alkaline Phosphatase: 54 U/L (ref 38–126)
Anion gap: 4 — ABNORMAL LOW (ref 5–15)
BUN: 21 mg/dL (ref 8–23)
CO2: 30 mmol/L (ref 22–32)
Calcium: 9 mg/dL (ref 8.9–10.3)
Chloride: 101 mmol/L (ref 98–111)
Creatinine, Ser: 0.79 mg/dL (ref 0.44–1.00)
GFR, Estimated: >60 mL/min (ref >60)
Glucose, Bld: 293 mg/dL — ABNORMAL HIGH (ref 70–99)
Potassium: 3.7 mmol/L (ref 3.5–5.1)
Sodium: 135 mmol/L (ref 135–145)
Total Bilirubin: 0.8 mg/dL (ref 0.3–1.2)
Total Protein: 6 g/dL — ABNORMAL LOW (ref 6.5–8.1)

## 2021-03-21 LAB — RESP PANEL BY RT-PCR (FLU A&B, COVID) ARPGX2
Influenza A by PCR: NEGATIVE
Influenza B by PCR: NEGATIVE
SARS Coronavirus 2 by RT PCR: POSITIVE — AB

## 2021-03-21 LAB — CBC
HCT: 27.8 % — ABNORMAL LOW (ref 36.0–46.0)
Hemoglobin: 8.6 g/dL — ABNORMAL LOW (ref 12.0–15.0)
MCH: 24.8 pg — ABNORMAL LOW (ref 26.0–34.0)
MCHC: 30.9 g/dL (ref 30.0–36.0)
MCV: 80.1 fL (ref 80.0–100.0)
Platelets: 229 10*3/uL (ref 150–400)
RBC: 3.47 MIL/uL — ABNORMAL LOW (ref 3.87–5.11)
RDW: 14.4 % (ref 11.5–15.5)
WBC: 5.1 10*3/uL (ref 4.0–10.5)
nRBC: 0 % (ref 0.0–0.2)

## 2021-03-21 LAB — PROTIME-INR
INR: 1.1 (ref 0.8–1.2)
Prothrombin Time: 14.4 seconds (ref 11.4–15.2)

## 2021-03-21 NOTE — ED Triage Notes (Signed)
Pt presents to ER via ems from home with home health nurse.  Nurse reports pt had "fever" of 99.3 at home and was given tylenol.  Pt also reports gen weakness.  Pt A&O x4 at this time in NAD.  Pt also reports some dark tarry stool x2 weeks.  Pt on eliquis and was recently started on carafate.

## 2021-03-22 ENCOUNTER — Other Ambulatory Visit: Payer: Self-pay

## 2021-03-22 ENCOUNTER — Encounter: Payer: Self-pay | Admitting: Family Medicine

## 2021-03-22 ENCOUNTER — Emergency Department: Payer: Medicare Other

## 2021-03-22 ENCOUNTER — Inpatient Hospital Stay
Admission: EM | Admit: 2021-03-22 | Discharge: 2021-03-24 | DRG: 377 | Disposition: A | Discharge status: Home Health | Payer: Medicare Other | Attending: Internal Medicine | Admitting: Internal Medicine

## 2021-03-22 DIAGNOSIS — E119 Type 2 diabetes mellitus without complications: Secondary | ICD-10-CM | POA: Diagnosis present

## 2021-03-22 DIAGNOSIS — K219 Gastro-esophageal reflux disease without esophagitis: Secondary | ICD-10-CM | POA: Diagnosis present

## 2021-03-22 DIAGNOSIS — E785 Hyperlipidemia, unspecified: Secondary | ICD-10-CM | POA: Diagnosis present

## 2021-03-22 DIAGNOSIS — I452 Bifascicular block: Secondary | ICD-10-CM | POA: Diagnosis present

## 2021-03-22 DIAGNOSIS — K922 Gastrointestinal hemorrhage, unspecified: Secondary | ICD-10-CM | POA: Diagnosis present

## 2021-03-22 DIAGNOSIS — Z853 Personal history of malignant neoplasm of breast: Secondary | ICD-10-CM | POA: Diagnosis not present

## 2021-03-22 DIAGNOSIS — I480 Paroxysmal atrial fibrillation: Secondary | ICD-10-CM | POA: Diagnosis present

## 2021-03-22 DIAGNOSIS — I10 Essential (primary) hypertension: Secondary | ICD-10-CM | POA: Diagnosis present

## 2021-03-22 DIAGNOSIS — U071 COVID-19: Secondary | ICD-10-CM | POA: Diagnosis present

## 2021-03-22 DIAGNOSIS — J1282 Pneumonia due to coronavirus disease 2019: Secondary | ICD-10-CM | POA: Diagnosis present

## 2021-03-22 DIAGNOSIS — R008 Other abnormalities of heart beat: Secondary | ICD-10-CM | POA: Diagnosis present

## 2021-03-22 DIAGNOSIS — I2510 Atherosclerotic heart disease of native coronary artery without angina pectoris: Secondary | ICD-10-CM | POA: Diagnosis present

## 2021-03-22 DIAGNOSIS — H9190 Unspecified hearing loss, unspecified ear: Secondary | ICD-10-CM | POA: Diagnosis present

## 2021-03-22 DIAGNOSIS — M1990 Unspecified osteoarthritis, unspecified site: Secondary | ICD-10-CM | POA: Diagnosis present

## 2021-03-22 DIAGNOSIS — K921 Melena: Principal | ICD-10-CM

## 2021-03-22 DIAGNOSIS — Z9013 Acquired absence of bilateral breasts and nipples: Secondary | ICD-10-CM | POA: Diagnosis not present

## 2021-03-22 DIAGNOSIS — I495 Sick sinus syndrome: Secondary | ICD-10-CM | POA: Diagnosis present

## 2021-03-22 DIAGNOSIS — Z950 Presence of cardiac pacemaker: Secondary | ICD-10-CM | POA: Diagnosis not present

## 2021-03-22 DIAGNOSIS — R54 Age-related physical debility: Secondary | ICD-10-CM | POA: Diagnosis present

## 2021-03-22 DIAGNOSIS — D509 Iron deficiency anemia, unspecified: Secondary | ICD-10-CM | POA: Diagnosis present

## 2021-03-22 DIAGNOSIS — Z7901 Long term (current) use of anticoagulants: Secondary | ICD-10-CM | POA: Diagnosis not present

## 2021-03-22 DIAGNOSIS — D62 Acute posthemorrhagic anemia: Secondary | ICD-10-CM | POA: Diagnosis present

## 2021-03-22 DIAGNOSIS — H3530 Unspecified macular degeneration: Secondary | ICD-10-CM | POA: Diagnosis present

## 2021-03-22 DIAGNOSIS — Z8249 Family history of ischemic heart disease and other diseases of the circulatory system: Secondary | ICD-10-CM | POA: Diagnosis not present

## 2021-03-22 DIAGNOSIS — R600 Localized edema: Secondary | ICD-10-CM | POA: Diagnosis present

## 2021-03-22 LAB — MAGNESIUM: Magnesium: 2.1 mg/dL (ref 1.7–2.4)

## 2021-03-22 LAB — IRON AND TIBC
Iron: 19 ug/dL — ABNORMAL LOW (ref 28–170)
Saturation Ratios: 6 % — ABNORMAL LOW (ref 10.4–31.8)
TIBC: 340 ug/dL (ref 250–450)
UIBC: 321 ug/dL

## 2021-03-22 LAB — FOLATE: Folate: 22.1 ng/mL (ref >5.9)

## 2021-03-22 LAB — FIBRINOGEN: Fibrinogen: 319 mg/dL (ref 210–475)

## 2021-03-22 LAB — GLUCOSE, CAPILLARY
Glucose-Capillary: 192 mg/dL — ABNORMAL HIGH (ref 70–99)
Glucose-Capillary: 172 mg/dL — ABNORMAL HIGH (ref 70–99)

## 2021-03-22 LAB — LACTATE DEHYDROGENASE: LDH: 143 U/L (ref 98–192)

## 2021-03-22 LAB — D-DIMER, QUANTITATIVE: D-Dimer, Quant: 2.82 ug/mL-FEU — ABNORMAL HIGH (ref 0.00–0.50)

## 2021-03-22 LAB — PROCALCITONIN: Procalcitonin: <0.1 ng/mL

## 2021-03-22 LAB — ABO/RH: ABO/RH(D): A POS

## 2021-03-22 LAB — HEMOGLOBIN AND HEMATOCRIT, BLOOD
HCT: 25.4 % — ABNORMAL LOW (ref 36.0–46.0)
HCT: 28.6 % — ABNORMAL LOW (ref 36.0–46.0)
HCT: 28.3 % — ABNORMAL LOW (ref 36.0–46.0)
Hemoglobin: 7.9 g/dL — ABNORMAL LOW (ref 12.0–15.0)
Hemoglobin: 9 g/dL — ABNORMAL LOW (ref 12.0–15.0)
Hemoglobin: 9 g/dL — ABNORMAL LOW (ref 12.0–15.0)

## 2021-03-22 LAB — RETICULOCYTES
Immature Retic Fract: 13 % (ref 2.3–15.9)
RBC.: 3.6 MIL/uL — ABNORMAL LOW (ref 3.87–5.11)
Retic Count, Absolute: 52.6 10*3/uL (ref 19.0–186.0)
Retic Ct Pct: 1.5 % (ref 0.4–3.1)

## 2021-03-22 LAB — FERRITIN: Ferritin: 13 ng/mL (ref 11–307)

## 2021-03-22 LAB — BRAIN NATRIURETIC PEPTIDE: B Natriuretic Peptide: 383.7 pg/mL — ABNORMAL HIGH (ref 0.0–100.0)

## 2021-03-22 LAB — CBG MONITORING, ED: Glucose-Capillary: 195 mg/dL — ABNORMAL HIGH (ref 70–99)

## 2021-03-22 LAB — TROPONIN I (HIGH SENSITIVITY): Troponin I (High Sensitivity): 11 ng/L (ref <18)

## 2021-03-22 LAB — PREPARE RBC (CROSSMATCH)

## 2021-03-22 MED ORDER — FAMOTIDINE 20 MG PO TABS
20.0000 mg | ORAL_TABLET | Freq: Two times a day (BID) | ORAL | Status: DC
  Administered 2021-03-22 – 2021-03-23 (×3): 20 mg via ORAL
  Filled 2021-03-22 (×3): qty 1

## 2021-03-22 MED ORDER — SODIUM CHLORIDE 0.9 % IV SOLN: 100.0000 mg | Freq: Every day | INTRAVENOUS | Status: DC

## 2021-03-22 MED ORDER — INSULIN ASPART 100 UNIT/ML IJ SOLN
0.0000 [IU] | INTRAMUSCULAR | Status: DC
  Administered 2021-03-22 – 2021-03-23 (×4): 2 [IU] via SUBCUTANEOUS
  Administered 2021-03-23: 15:00:00 3 [IU] via SUBCUTANEOUS
  Filled 2021-03-22 (×6): qty 1

## 2021-03-22 MED ORDER — SODIUM CHLORIDE 0.9 % IV SOLN
200.0000 mg | Freq: Once | INTRAVENOUS | Status: AC
  Administered 2021-03-22: 200 mg via INTRAVENOUS
  Filled 2021-03-22: qty 40

## 2021-03-22 MED ORDER — SODIUM CHLORIDE 0.9 % IV SOLN: INTRAVENOUS | Status: DC

## 2021-03-22 MED ORDER — SODIUM CHLORIDE 0.9 % IV SOLN: 200.0000 mg | Freq: Once | INTRAVENOUS | Status: DC

## 2021-03-22 MED ORDER — TORSEMIDE 20 MG PO TABS
20.0000 mg | ORAL_TABLET | Freq: Two times a day (BID) | ORAL | Status: DC
  Administered 2021-03-22 – 2021-03-23 (×3): 20 mg via ORAL
  Filled 2021-03-22 (×3): qty 1

## 2021-03-22 MED ORDER — METOPROLOL SUCCINATE ER 50 MG PO TB24
50.0000 mg | ORAL_TABLET | Freq: Every day | ORAL | Status: DC
  Administered 2021-03-22 – 2021-03-24 (×3): 50 mg via ORAL
  Filled 2021-03-22 (×3): qty 1

## 2021-03-22 MED ORDER — SUCRALFATE 1 G PO TABS
1.0000 g | ORAL_TABLET | Freq: Four times a day (QID) | ORAL | Status: DC
  Administered 2021-03-22 – 2021-03-24 (×9): 1 g via ORAL
  Filled 2021-03-22 (×9): qty 1

## 2021-03-22 MED ORDER — POTASSIUM CHLORIDE CRYS ER 20 MEQ PO TBCR
40.0000 meq | EXTENDED_RELEASE_TABLET | Freq: Every day | ORAL | Status: DC
  Administered 2021-03-22: 40 meq via ORAL
  Filled 2021-03-22 (×3): qty 2

## 2021-03-22 MED ORDER — ATORVASTATIN CALCIUM 20 MG PO TABS
20.0000 mg | ORAL_TABLET | Freq: Every day | ORAL | Status: DC
  Administered 2021-03-22 – 2021-03-23 (×2): 20 mg via ORAL
  Filled 2021-03-22 (×2): qty 1

## 2021-03-22 MED ORDER — PIOGLITAZONE HCL 15 MG PO TABS
15.0000 mg | ORAL_TABLET | Freq: Every day | ORAL | Status: DC
  Administered 2021-03-22 – 2021-03-24 (×3): 15 mg via ORAL
  Filled 2021-03-22 (×3): qty 1

## 2021-03-22 MED ORDER — ISOSORBIDE MONONITRATE ER 30 MG PO TB24
30.0000 mg | ORAL_TABLET | Freq: Every day | ORAL | Status: DC
  Administered 2021-03-22 – 2021-03-24 (×3): 30 mg via ORAL
  Filled 2021-03-22 (×3): qty 1

## 2021-03-22 MED ORDER — PANTOPRAZOLE 80MG IVPB - SIMPLE MED
80.0000 mg | Freq: Once | INTRAVENOUS | Status: AC
  Administered 2021-03-22: 80 mg via INTRAVENOUS
  Filled 2021-03-22: qty 100

## 2021-03-22 MED ORDER — PANTOPRAZOLE SODIUM 40 MG IV SOLR: 40.0000 mg | Freq: Two times a day (BID) | INTRAVENOUS | Status: DC

## 2021-03-22 MED ORDER — MIRTAZAPINE 15 MG PO TABS
7.5000 mg | ORAL_TABLET | Freq: Every day | ORAL | Status: DC
  Administered 2021-03-22 – 2021-03-23 (×2): 7.5 mg via ORAL
  Filled 2021-03-22 (×2): qty 1

## 2021-03-22 MED ORDER — TORSEMIDE 20 MG PO TABS
20.0000 mg | ORAL_TABLET | Freq: Two times a day (BID) | ORAL | Status: DC
  Administered 2021-03-22: 20 mg via ORAL
  Filled 2021-03-22: qty 1

## 2021-03-22 MED ORDER — ENOXAPARIN SODIUM 40 MG/0.4ML IJ SOSY
40.0000 mg | PREFILLED_SYRINGE | INTRAMUSCULAR | Status: DC
  Administered 2021-03-22: 40 mg via SUBCUTANEOUS
  Filled 2021-03-22: qty 0.4

## 2021-03-22 MED ORDER — ZINC SULFATE 220 (50 ZN) MG PO CAPS
220.0000 mg | ORAL_CAPSULE | Freq: Every day | ORAL | Status: DC
  Administered 2021-03-22 – 2021-03-24 (×3): 220 mg via ORAL
  Filled 2021-03-22 (×3): qty 1

## 2021-03-22 MED ORDER — SODIUM CHLORIDE 0.9% IV SOLUTION
Freq: Once | INTRAVENOUS | Status: DC
  Filled 2021-03-22: qty 250

## 2021-03-22 MED ORDER — ACETAMINOPHEN 325 MG PO TABS: 650.0000 mg | ORAL_TABLET | Freq: Four times a day (QID) | ORAL | Status: DC | PRN

## 2021-03-22 MED ORDER — OCUVITE-LUTEIN PO CAPS
ORAL_CAPSULE | Freq: Two times a day (BID) | ORAL | Status: DC
  Administered 2021-03-22 – 2021-03-24 (×4): 1 via ORAL
  Filled 2021-03-22 (×6): qty 1

## 2021-03-22 MED ORDER — MAGNESIUM HYDROXIDE 400 MG/5ML PO SUSP: 30.0000 mL | Freq: Every day | ORAL | Status: DC | PRN

## 2021-03-22 MED ORDER — ASCORBIC ACID 500 MG PO TABS
500.0000 mg | ORAL_TABLET | Freq: Every day | ORAL | Status: DC
  Administered 2021-03-22 – 2021-03-24 (×3): 500 mg via ORAL
  Filled 2021-03-22 (×3): qty 1

## 2021-03-22 MED ORDER — ONDANSETRON HCL 4 MG/2ML IJ SOLN: 4.0000 mg | Freq: Four times a day (QID) | INTRAMUSCULAR | Status: DC | PRN

## 2021-03-22 MED ORDER — DIGOXIN 125 MCG PO TABS
0.1250 mg | ORAL_TABLET | ORAL | Status: DC
  Administered 2021-03-24: 15:00:00 0.125 mg via ORAL
  Filled 2021-03-22 (×2): qty 1

## 2021-03-22 MED ORDER — SODIUM CHLORIDE 0.9 % IV SOLN
100.0000 mg | Freq: Every day | INTRAVENOUS | Status: DC
  Administered 2021-03-23 – 2021-03-24 (×2): 100 mg via INTRAVENOUS
  Filled 2021-03-22: qty 20
  Filled 2021-03-22 (×2): qty 100

## 2021-03-22 MED ORDER — NITROGLYCERIN 0.4 MG SL SUBL: 0.4000 mg | SUBLINGUAL_TABLET | SUBLINGUAL | Status: DC | PRN

## 2021-03-22 MED ORDER — ONDANSETRON HCL 4 MG PO TABS: 4.0000 mg | ORAL_TABLET | Freq: Four times a day (QID) | ORAL | Status: DC | PRN

## 2021-03-22 MED ORDER — GLIPIZIDE ER 10 MG PO TB24
10.0000 mg | ORAL_TABLET | Freq: Every day | ORAL | Status: DC
  Filled 2021-03-22: qty 1

## 2021-03-22 MED ORDER — TRAZODONE HCL 50 MG PO TABS: 25.0000 mg | ORAL_TABLET | Freq: Every evening | ORAL | Status: DC | PRN

## 2021-03-22 MED ORDER — FAMOTIDINE 20 MG PO TABS: 20.0000 mg | ORAL_TABLET | Freq: Every day | ORAL | Status: DC

## 2021-03-22 MED ORDER — GUAIFENESIN-DM 100-10 MG/5ML PO SYRP
10.0000 mL | ORAL_SOLUTION | ORAL | Status: DC | PRN
  Administered 2021-03-23: 09:00:00 10 mL via ORAL
  Filled 2021-03-22: qty 10

## 2021-03-22 MED ORDER — POLYVINYL ALCOHOL 1.4 % OP SOLN
1.0000 [drp] | Freq: Three times a day (TID) | OPHTHALMIC | Status: DC | PRN
  Filled 2021-03-22: qty 15

## 2021-03-22 MED ORDER — PANTOPRAZOLE INFUSION (NEW) - SIMPLE MED
8.0000 mg/h | INTRAVENOUS | Status: DC
  Administered 2021-03-22 – 2021-03-23 (×3): 8 mg/h via INTRAVENOUS
  Filled 2021-03-22 (×2): qty 100

## 2021-03-22 NOTE — Progress Notes (Signed)
Pt hbg 7.9 before blood tranfusion/ orders to transfuse 2 units of PRBs/ first unit of blood currently infusing / pt legs are swollen + 3 edema - hx of CHF/ MD made aware/ orders to only transfuse 1 unit if no active bleeding noted/ and recheck hbg 1 hour later/ home dose of torsemide ordered/ will monitor

## 2021-03-22 NOTE — Progress Notes (Signed)
Remdesivir - Pharmacy Brief Note   O:  CXR: Ordered SpO2: 87-95% on RA   A/P:  Remdesivir 200 mg IVPB once followed by 100 mg IVPB daily x 4 days.   Renda Rolls, PharmD, MBA 03/22/2021 1:05 AM

## 2021-03-22 NOTE — ED Notes (Signed)
RN placed pt back in bed. New brief placed.

## 2021-03-22 NOTE — ED Notes (Signed)
Rn to bedside to answer call bell. Pt had to have BM. Pt moved to toilet to have BM.

## 2021-03-22 NOTE — ED Notes (Signed)
Blood bank called and asked for repeat ABO draw in [place]. It was recollected and sent down.

## 2021-03-22 NOTE — ED Notes (Addendum)
Pt niece is her HPOA and called for update. Family advised pt is taking a medication for bleeding. MD Sabra Heck is her PCP who is aware. [phone]. Pt resides at [facility].

## 2021-03-22 NOTE — Consult Note (Signed)
Inpatient Consultation   Patient ID: Elizabeth Novak is a 87 y.o. female.  Requesting Provider: Eugenie Norrie, MD  Date of Admission: 03/22/2021  Date of Consult: 03/22/21   Reason for Consultation: anemia, melena   Patient's Chief Complaint:   Chief Complaint  Patient presents with   Fever   Weakness    87 year old Caucasian female with A. fib on Eliquis (unknown last dose), DM2, CHF, diverticulosis, GERD who presented to the hospital with generalized weakness, shortness of breath and cough as well as fever.  She was discovered to be positive for COVID-19 pneumonia.  Gastroenterology is consulted for anemia and intermittent melena over the last couple weeks.  Patient noted dark stools over the last couple weeks.  Her hemoglobin in October was 11.6 and on presentation yesterday was 8.6 with an MCV of 84.  This descended down to 7.9 and she is awaiting blood transfusion.  BUN is 21 creatinine 0.7 which is at baseline.  Unclear as to when her last Eliquis dose was.  Nursing noted large brown bowel movement.  INR is 1.1 ferritin is 13.  She denies any abdominal pain nausea or vomiting.  She was started on Protonix bolus and drip.  She did receive remdesivir while in the emergency department.  Patient denies nausea, vomiting, coffee ground emesis, hematemesis, abdominal pain, diarrhea, constipation, hematochezia, chills, odynophagia, jaundice, heartburn/reflux.  At home was on 20 mg ppi bid On Eliquis 2.5mg - although is reportedly been held for several days, but unclear Denies NSAIDs, Anti-plt agents Denies family history of gastrointestinal disease and malignancy Previous Endoscopies:EGD/Colonoscopy 01/2013. Colon Polyps- TAs, gastric polyps     Past Medical History:  Diagnosis Date   A-fib ([place])    Anemia    Arthritis    Breast cancer ([place]) 1978   left breast with lymph node removal   CHF (congestive heart failure) ([place])    Diabetes mellitus without complication ([place])     Diverticulitis    Dysrhythmia    GERD (gastroesophageal reflux disease)    Hyperlipidemia    Macular degeneration of both eyes    Mitral valve prolapse    Mitral valve regurgitation    Presence of permanent cardiac pacemaker     Past Surgical History:  Procedure Laterality Date   ABDOMINAL HYSTERECTOMY     APPENDECTOMY     BREAST SURGERY     CARDIAC CATHETERIZATION     ESOPHAGEAL DILATION     EYE SURGERY Bilateral    Cataract Extraction with IOL   INSERT / REPLACE / REMOVE PACEMAKER     IRRIGATION AND DEBRIDEMENT HEMATOMA Left 08/21/2015   Procedure: IRRIGATION AND DEBRIDEMENT HEMATOMA;  Surgeon: Robert Bellow, MD;  Location: [facility] OR[facility];  Service: General;  Laterality: Left;   KNEE ARTHROSCOPY Right    LEFT OOPHORECTOMY Left    MASTECTOMY Left 1978   MASTECTOMY Right 1978   OPEN REDUCTION INTERNAL FIXATION (ORIF) DISTAL RADIAL FRACTURE Right 02/14/2018   Procedure: OPEN REDUCTION INTERNAL FIXATION (ORIF) DISTAL RADIAL FRACTURE;  Surgeon: Hessie Knows, MD;  Location: [facility] OR[facility];  Service: Orthopedics;  Laterality: Right;   PACEMAKER INSERTION  08/11/12   PPM GENERATOR CHANGEOUT N/A 05/02/2020   Procedure: PPM GENERATOR CHANGEOUT;  Surgeon: Isaias Cowman, MD;  Location: [facility];  Service: Cardiovascular;  Laterality: N/A;   TEMPORAL ARTERY BIOPSY / LIGATION     TONSILLECTOMY      Allergies  Allergen Reactions   Codeine Nausea Only   Disopyramide  Other reaction(s): Unknown   Ibuprofen Diarrhea   Iodine     blisters   Metformin And Related Other (See Comments)    unknown   Norpace [Disopyramide Phosphate]    Quinidine     Other reaction(s): Unknown   Terfenadine     Other reaction(s): Unknown   Topiramate     Other reaction(s): Other (See Comments) Hair loss   Verapamil     Other reaction(s): Unknown   Dexamethasone Sodium Phosphate Palpitations    Family History  Problem Relation Age of Onset   Hypertension Mother     Social History    Tobacco Use   Smoking status: Never   Smokeless tobacco: Never  Vaping Use   Vaping Use: Never used  Substance Use Topics   Alcohol use: No    Alcohol/week: 0.0 standard drinks   Drug use: No     Pertinent GI related history and allergies were reviewed with the patient  Review of Systems  Constitutional:  Negative for activity change, appetite change, chills, diaphoresis, fatigue, fever and unexpected weight change.  HENT:  Negative for trouble swallowing and voice change.   Respiratory:  Positive for cough and shortness of breath. Negative for wheezing.   Cardiovascular:  Negative for chest pain, palpitations and leg swelling.  Gastrointestinal:  Positive for blood in stool (reports dark stool). Negative for abdominal distention, abdominal pain, anal bleeding, constipation, diarrhea, nausea, rectal pain and vomiting.  Musculoskeletal:  Negative for arthralgias and myalgias.  Skin:  Negative for color change and pallor.  Neurological:  Negative for dizziness, syncope and weakness.  Psychiatric/Behavioral:  Negative for confusion.   All other systems reviewed and are negative.   Medications Home Medications No current facility-administered medications on file prior to encounter.   Current Outpatient Medications on File Prior to Encounter  Medication Sig Dispense Refill   apixaban (ELIQUIS) 2.5 MG TABS tablet Take 1 tablet (2.5 mg total) by mouth 2 (two) times daily. 60 tablet 0   atorvastatin (LIPITOR) 20 MG tablet Take 1 tablet (20 mg total) by mouth at bedtime. 30 tablet 0   digoxin (LANOXIN) 0.125 MG tablet Take 1 tablet (0.125 mg total) by mouth every other day. CHF- * Hold for HR < 60 15 tablet 0   famotidine (PEPCID) 20 MG tablet Take 20 mg by mouth at bedtime.     glipiZIDE (GLUCOTROL XL) 10 MG 24 hr tablet Take 10 mg by mouth daily with breakfast.     isosorbide mononitrate (IMDUR) 30 MG 24 hr tablet Take 30 mg by mouth daily.     metoprolol succinate (TOPROL-XL) 50 MG  24 hr tablet Take 1 tablet (50 mg total) by mouth daily. 30 tablet 0   mirtazapine (REMERON) 15 MG tablet Take 7.5 mg by mouth at bedtime.     Multiple Vitamins-Minerals (PRESERVISION AREDS 2 PO) Take 1 tablet by mouth 2 (two) times daily.     omeprazole (PRILOSEC) 20 MG capsule Take 20 mg by mouth 2 (two) times daily.     pioglitazone (ACTOS) 15 MG tablet Take 15 mg by mouth daily.     potassium chloride (KLOR-CON) 10 MEQ tablet Take 40 mEq by mouth daily.     sucralfate (CARAFATE) 1 g tablet Take 1 g by mouth 4 (four) times daily.     torsemide (DEMADEX) 20 MG tablet Take 20 mg by mouth 2 (two) times daily.     amLODipine (NORVASC) 5 MG tablet Take 5 mg by mouth at bedtime. (Patient  not taking: Reported on 03/22/2021)     carboxymethylcellulose (REFRESH PLUS) 0.5 % SOLN 1 drop 3 (three) times daily as needed.     metolazone (ZAROXOLYN) 2.5 MG tablet Take 2.5 mg by mouth once a week. (Patient not taking: Reported on 03/22/2021)     nitroGLYCERIN (NITROSTAT) 0.4 MG SL tablet Place 0.4 mg under the tongue every 5 (five) minutes as needed for chest pain.     Pertinent GI related medications were reviewed with the patient  Inpatient Medications  Current Facility-Administered Medications:    0.9 %  sodium chloride infusion (Manually program via Guardrails IV Fluids), , Intravenous, Once, Mansy, Jan A, MD   0.9 %  sodium chloride infusion, , Intravenous, Continuous, Mansy, Arvella Merles, MD, Paused at 03/22/21 1247   acetaminophen (TYLENOL) tablet 650 mg, 650 mg, Oral, Q6H PRN, Mansy, Jan A, MD   ascorbic acid (VITAMIN C) tablet 500 mg, 500 mg, Oral, Daily, Mansy, Jan A, MD, 500 mg at 03/22/21 0846   atorvastatin (LIPITOR) tablet 20 mg, 20 mg, Oral, QHS, Mansy, Jan A, MD   digoxin (LANOXIN) tablet 0.125 mg, 0.125 mg, Oral, QODAY, Mansy, Jan A, MD   famotidine (PEPCID) tablet 20 mg, 20 mg, Oral, BID, Mansy, Jan A, MD, 20 mg at 03/22/21 0846   guaiFENesin-dextromethorphan (ROBITUSSIN DM) 100-10 MG/5ML  syrup 10 mL, 10 mL, Oral, Q4H PRN, Mansy, Jan A, MD   insulin aspart (novoLOG) injection 0-9 Units, 0-9 Units, Subcutaneous, Q4H, Lorella Nimrod, MD   isosorbide mononitrate (IMDUR) 24 hr tablet 30 mg, 30 mg, Oral, Daily, Mansy, Jan A, MD, 30 mg at 03/22/21 0847   magnesium hydroxide (MILK OF MAGNESIA) suspension 30 mL, 30 mL, Oral, Daily PRN, Mansy, Jan A, MD   metoprolol succinate (TOPROL-XL) 24 hr tablet 50 mg, 50 mg, Oral, Daily, Mansy, Jan A, MD, 50 mg at 03/22/21 0847   mirtazapine (REMERON) tablet 7.5 mg, 7.5 mg, Oral, QHS, Mansy, Jan A, MD   multivitamin-lutein (OCUVITE-LUTEIN) capsule, , Oral, BID, Mansy, Jan A, MD, 1 capsule at 03/22/21 1055   nitroGLYCERIN (NITROSTAT) SL tablet 0.4 mg, 0.4 mg, Sublingual, Q5 min PRN, Mansy, Jan A, MD   ondansetron (ZOFRAN) tablet 4 mg, 4 mg, Oral, Q6H PRN **OR** ondansetron (ZOFRAN) injection 4 mg, 4 mg, Intravenous, Q6H PRN, Mansy, Jan A, MD   [START ON 03/25/2021] pantoprazole (PROTONIX) injection 40 mg, 40 mg, Intravenous, Q12H, Alfred Levins, [place], MD   pantoprozole (PROTONIX) 80 mg /NS 100 mL infusion, 8 mg/hr, Intravenous, Continuous, Alfred Levins, [place], MD, Last Rate: 10 mL/hr at 03/22/21 0337, 8 mg/hr at 03/22/21 3235   pioglitazone (ACTOS) tablet 15 mg, 15 mg, Oral, Daily, Mansy, Jan A, MD, 15 mg at 03/22/21 1056   polyvinyl alcohol (LIQUIFILM TEARS) 1.4 % ophthalmic solution 1 drop, 1 drop, Both Eyes, TID PRN, Mansy, Jan A, MD   potassium chloride SA (KLOR-CON M) CR tablet 40 mEq, 40 mEq, Oral, Daily, Mansy, Jan A, MD, 40 mEq at 03/22/21 0848   [COMPLETED] remdesivir 200 mg in sodium chloride 0.9% 250 mL IVPB, 200 mg, Intravenous, Once, Stopped at 03/22/21 0203 **FOLLOWED BY** [START ON 03/23/2021] remdesivir 100 mg in sodium chloride 0.9 % 100 mL IVPB, 100 mg, Intravenous, Daily, Belue, Alver Sorrow, RPH   sucralfate (CARAFATE) tablet 1 g, 1 g, Oral, QID, Mansy, Jan A, MD, 1 g at 03/22/21 0847   traZODone (DESYREL) tablet 25 mg, 25 mg, Oral, QHS PRN,  Mansy, Jan A, MD   zinc sulfate capsule 220 mg, 220 mg,  Oral, Daily, Mansy, Jan A, MD, 220 mg at 03/22/21 0900  Current Outpatient Medications:    apixaban (ELIQUIS) 2.5 MG TABS tablet, Take 1 tablet (2.5 mg total) by mouth 2 (two) times daily., Disp: 60 tablet, Rfl: 0   atorvastatin (LIPITOR) 20 MG tablet, Take 1 tablet (20 mg total) by mouth at bedtime., Disp: 30 tablet, Rfl: 0   digoxin (LANOXIN) 0.125 MG tablet, Take 1 tablet (0.125 mg total) by mouth every other day. CHF- * Hold for HR < 60, Disp: 15 tablet, Rfl: 0   famotidine (PEPCID) 20 MG tablet, Take 20 mg by mouth at bedtime., Disp: , Rfl:    glipiZIDE (GLUCOTROL XL) 10 MG 24 hr tablet, Take 10 mg by mouth daily with breakfast., Disp: , Rfl:    isosorbide mononitrate (IMDUR) 30 MG 24 hr tablet, Take 30 mg by mouth daily., Disp: , Rfl:    metoprolol succinate (TOPROL-XL) 50 MG 24 hr tablet, Take 1 tablet (50 mg total) by mouth daily., Disp: 30 tablet, Rfl: 0   mirtazapine (REMERON) 15 MG tablet, Take 7.5 mg by mouth at bedtime., Disp: , Rfl:    Multiple Vitamins-Minerals (PRESERVISION AREDS 2 PO), Take 1 tablet by mouth 2 (two) times daily., Disp: , Rfl:    omeprazole (PRILOSEC) 20 MG capsule, Take 20 mg by mouth 2 (two) times daily., Disp: , Rfl:    pioglitazone (ACTOS) 15 MG tablet, Take 15 mg by mouth daily., Disp: , Rfl:    potassium chloride (KLOR-CON) 10 MEQ tablet, Take 40 mEq by mouth daily., Disp: , Rfl:    sucralfate (CARAFATE) 1 g tablet, Take 1 g by mouth 4 (four) times daily., Disp: , Rfl:    torsemide (DEMADEX) 20 MG tablet, Take 20 mg by mouth 2 (two) times daily., Disp: , Rfl:    amLODipine (NORVASC) 5 MG tablet, Take 5 mg by mouth at bedtime. (Patient not taking: Reported on 03/22/2021), Disp: , Rfl:    carboxymethylcellulose (REFRESH PLUS) 0.5 % SOLN, 1 drop 3 (three) times daily as needed., Disp: , Rfl:    metolazone (ZAROXOLYN) 2.5 MG tablet, Take 2.5 mg by mouth once a week. (Patient not taking: Reported on  03/22/2021), Disp: , Rfl:    nitroGLYCERIN (NITROSTAT) 0.4 MG SL tablet, Place 0.4 mg under the tongue every 5 (five) minutes as needed for chest pain., Disp: , Rfl:   sodium chloride Stopped (03/22/21 1247)   pantoprazole 8 mg/hr (03/22/21 0337)   [START ON 03/23/2021] remdesivir 100 mg in NS 100 mL      acetaminophen, guaiFENesin-dextromethorphan, magnesium hydroxide, nitroGLYCERIN, ondansetron **OR** ondansetron (ZOFRAN) IV, polyvinyl alcohol, traZODone   Objective   Vitals:   03/22/21 1200 03/22/21 1230 03/22/21 1234 03/22/21 1257  BP: 123/60 135/83 133/60 (!) 116/55  Pulse: 67 68 75 78  Resp:   16 16  Temp:   98.9 F (37.2 C) 98.8 F (37.1 C)  TempSrc:   Oral Oral  SpO2: 98% 97% 97% 99%  Weight:      Height:         Physical Exam Vitals and nursing note reviewed.  Constitutional:      General: She is not in acute distress.    Appearance: She is not ill-appearing, toxic-appearing or diaphoretic.     Comments: Frail appearing  HENT:     Head: Normocephalic and atraumatic.     Ears:     Comments: HOH    Nose: Nose normal.     Mouth/Throat:  Mouth: Mucous membranes are moist.     Pharynx: Oropharynx is clear.  Eyes:     General: No scleral icterus.    Extraocular Movements: Extraocular movements intact.  Cardiovascular:     Rate and Rhythm: Normal rate and regular rhythm.     Heart sounds: Normal heart sounds. No murmur heard.   No friction rub. No gallop.  Pulmonary:     Effort: Pulmonary effort is normal. No respiratory distress.     Breath sounds: No wheezing, rhonchi or rales.     Comments: Diminished breath sounds bilaterally Abdominal:     General: Abdomen is flat. Bowel sounds are normal. There is no distension.     Palpations: Abdomen is soft.     Tenderness: There is no abdominal tenderness. There is no guarding or rebound.  Musculoskeletal:     Cervical back: Neck supple.     Right lower leg: Edema present.     Left lower leg: Edema present.   Skin:    General: Skin is warm and dry.     Coloration: Skin is not jaundiced or pale.  Neurological:     General: No focal deficit present.     Mental Status: She is alert and oriented to person, place, and time. Mental status is at baseline.  Psychiatric:        Mood and Affect: Mood normal.        Behavior: Behavior normal.        Thought Content: Thought content normal.        Judgment: Judgment normal.    Laboratory Data Recent Labs  Lab 03/21/21 2027 03/22/21 0338  WBC 5.1  --   HGB 8.6* 7.9*  HCT 27.8* 25.4*  PLT 229  --    Recent Labs  Lab 03/21/21 2027  NA 135  K 3.7  CL 101  CO2 30  BUN 21  CALCIUM 9.0  PROT 6.0*  BILITOT 0.8  ALKPHOS 54  ALT 17  AST 20  GLUCOSE 293*   Recent Labs  Lab 03/21/21 2027  INR 1.1    No results for input(s): LIPASE in the last 72 hours.      Imaging Studies: DG Chest Portable 1 View  Result Date: 03/22/2021 CLINICAL DATA:  COVID-19 positivity with fevers, initial encounter EXAM: PORTABLE CHEST 1 VIEW COMPARISON:  08/29/2019 FINDINGS: Cardiac shadow is enlarged but stable. Pacing device is again seen. The lungs are clear bilaterally. No infiltrate is noted. Multiple skin folds are noted over the right chest. No bony abnormality is seen. IMPRESSION: No acute abnormality noted Electronically Signed   By: Inez Catalina M.D.   On: 03/22/2021 01:26    Assessment:   # Acute on chronic anemia; iron deficient - Hgb on presentation was 8.6. now 7.9; was 11.6 two months ago  - MCV 84 - bun/cr 21/0.7- at baseline - awaiting transfusion as of evaluation - nursing has noted large brown stool- no melena - ferritin 13; inr 13 - EGD/Colonoscopy 01/2013. Colon Polyps- TA, gastric polyps  # COVID-19 pna - receiving remdesivir  # afib on eliquis - reports it has been held for several days  # SSS s/p ppm  # DM2  # CVA  Plan:  Given age and presentation with dyspnea and cough with covid 19 + pna, d/w patient and agreed to  conservative management at this time.  Plan to perform Upper GI study- radiology reports this will not be able to be done until Monday. She will need  to be npo that midnight for this study. Protonix 40 mg iv bid Eliquis being held Monitor H&H; transfusion and resuscitation as per primary team Avoid frequent lab draws to prevent lab induced anemia Maintain two iv sites [place] for diet from gi perspective at this time Avoid nsaid Discussed with Ruthann Cancer, nephew/HCPOA, who is amenable to plan  GI will follow  I personally performed the service.  Management of other medical comorbidities as per primary team  Thank you for allowing [place] to participate in this patient's care. Please don't hesitate to call if any questions or concerns arise.   Annamaria Helling, DO [facility]  Portions of the record may have been created with voice recognition software. Occasional wrong-word or 'sound-a-like' substitutions may have occurred due to the inherent limitations of voice recognition software.  Read the chart carefully and recognize, using context, where substitutions may have occurred.

## 2021-03-22 NOTE — Progress Notes (Signed)
PROGRESS NOTE    Elizabeth Novak  NFA:[id number] DOB: 1924-01-16 DOA: 03/22/2021 PCP: Danella Penton, MD   Brief Narrative: Taken from H&P. Elizabeth Novak is a 87 y.o. female with medical history significant for type 2 diabetes mellitus, CHF, diverticulitis, GERD, dyslipidemia and atrial fibrillation on Eliquis, who presented to the emergency room with complaint of worsening cough and mild dyspnea. She was also experiencing melena for the past couple of weeks.  Apparently PCP told her to hold Eliquis for 7 days??  Not sure whether she was still taking it.  She was unable to tell about any NSAID.  Denies any abdominal pain.  Had a bowel movement in ED which was brown. Hemoglobin at 8.6 which dropped to 7.9 this morning.  It was 11.6 in October 2022. GI was consulted-they ordered upper GI series with nuclear medicine which will be done on Monday. Blood transfusion ordered by admitting provider. Also tested positive for COVID-19.  Chest x-ray without any acute abnormality.  Started on remdesivir  Subjective: Patient was very hard of hearing when seen today.  Having melena for the past few weeks and was told by PCP to stop Eliquis for 7 days, not sure when she stopped using it.  Unable to tell me about any NSAID use.  Per patient she lives independently with caregivers and believes that one of her caregiver was sick.  Assessment & Plan:   Principal Problem:   GI bleeding  Blood loss anemia secondary to GI bleed.  Symptoms concerning for GI bleed going on for couple of weeks with significant drop of hemoglobin from 11.6 in October till 7.9 today.  No significant change in BUN.  Not sure when she stopped Eliquis.  BM in ED was brown. -Transfuse with 1 unit only at this time. -GI is planning to do conservative management and upper GI series on Monday. -Continue with IV Protonix twice daily -Discontinue Eliquis.  Paroxysmal atrial fibrillation.  EKG with A. fib and incomplete right bundle  branch block.  Rate seems well controlled. -Continue home dose of digoxin and Toprol-XL. -Discontinue Eliquis  COVID-19 infection.  Pretty much asymptomatic except mild cough.  On room air -Continue with remdesivir-can get 3 days  -Continue with supplement and supportive care  History of CAD.  No chest pain. -Continue home dose of statin, Imdur and Toprol  Lower extremity edema.  No recent echo in the chart.  Lung seems clear.  BNP elevated at 383 -Continue home dose of torsemide -Monitor BMP  Essential hypertension.  Blood pressure within goal. -Continue home dose of Norvasc  Dyslipidemia. -Continue home statin  Objective: Vitals:   03/22/21 1230 03/22/21 1234 03/22/21 1257 03/22/21 1328  BP: 135/83 133/60 (!) 116/55 119/65  Pulse: 68 75 78 83  Resp:  16 16 17   Temp:  98.9 F (37.2 C) 98.8 F (37.1 C) 98.8 F (37.1 C)  TempSrc:  Oral Oral Oral  SpO2: 97% 97% 99% 97%  Weight:      Height:        Intake/Output Summary (Last 24 hours) at 03/22/2021 1459 Last data filed at 03/22/2021 0253 Gross per 24 hour  Intake 385.8 ml  Output --  Net 385.8 ml   Filed Weights   03/21/21 2019  Weight: 45.4 kg    Examination:  General exam: Appears calm and comfortable  Respiratory system: Clear to auscultation. Respiratory effort normal. Cardiovascular system: S1 & S2 heard, RRR. No JVD, murmurs, rubs, gallops or clicks. Gastrointestinal system: Soft,  nontender, nondistended, bowel sounds positive. Central nervous system: Alert and oriented. No focal neurological deficits. Extremities: 2+ LE edema, no cyanosis, pulses intact and symmetrical. Skin: No rashes, lesions or ulcers Psychiatry: Judgement and insight appear normal.    DVT prophylaxis: SCDs, GI bleed Code Status: Full Family Communication:  Disposition Plan:  Status is: Inpatient  Remains inpatient appropriate because: Severity of illness   Level of care: Progressive  All the records are reviewed and  case discussed with Care Management/Social Worker. Management plans discussed with the patient, nursing and they are in agreement.  Consultants:  Gastroenterology  Procedures:  Antimicrobials:   Data Reviewed: I have personally reviewed following labs and imaging studies  CBC: Recent Labs  Lab 03/21/21 2027 03/22/21 0338  WBC 5.1  --   HGB 8.6* 7.9*  HCT 27.8* 25.4*  MCV 80.1  --   PLT 229  --    Basic Metabolic Panel: Recent Labs  Lab 03/21/21 2027  NA 135  K 3.7  CL 101  CO2 30  GLUCOSE 293*  BUN 21  CREATININE 0.79  CALCIUM 9.0  MG 2.1   GFR: Estimated Creatinine Clearance: 30.1 mL/min (by C-G formula based on SCr of 0.79 mg/dL). Liver Function Tests: Recent Labs  Lab 03/21/21 2027  AST 20  ALT 17  ALKPHOS 54  BILITOT 0.8  PROT 6.0*  ALBUMIN 3.3*   No results for input(s): LIPASE, AMYLASE in the last 168 hours. No results for input(s): AMMONIA in the last 168 hours. Coagulation Profile: Recent Labs  Lab 03/21/21 2027  INR 1.1   Cardiac Enzymes: No results for input(s): CKTOTAL, CKMB, CKMBINDEX, TROPONINI in the last 168 hours. BNP (last 3 results) No results for input(s): PROBNP in the last 8760 hours. HbA1C: No results for input(s): HGBA1C in the last 72 hours. CBG: Recent Labs  Lab 03/22/21 0831  GLUCAP 195*   Lipid Profile: No results for input(s): CHOL, HDL, LDLCALC, TRIG, CHOLHDL, LDLDIRECT in the last 72 hours. Thyroid Function Tests: No results for input(s): TSH, T4TOTAL, FREET4, T3FREE, THYROIDAB in the last 72 hours. Anemia Panel: Recent Labs    03/22/21 0338  FERRITIN 13   Sepsis Labs: Recent Labs  Lab 03/22/21 0338  PROCALCITON <0.10    Recent Results (from the past 240 hour(s))  Resp Panel by RT-PCR (Flu A&B, Covid) Nasopharyngeal Swab     Status: Abnormal   Collection Time: 03/21/21  8:27 PM   Specimen: Nasopharyngeal Swab; Nasopharyngeal(NP) swabs in vial transport medium  Result Value Ref Range Status   SARS  Coronavirus 2 by RT PCR POSITIVE (A) NEGATIVE Final    Comment: (NOTE) SARS-CoV-2 target nucleic acids are DETECTED.  The SARS-CoV-2 RNA is generally detectable in upper respiratory specimens during the acute phase of infection. Positive results are indicative of the presence of the identified virus, but do not rule out bacterial infection or co-infection with other pathogens not detected by the test. Clinical correlation with patient history and other diagnostic information is necessary to determine patient infection status. The expected result is Negative.  Fact Sheet for Patients: BloggerCourse.com  Fact Sheet for Healthcare Providers: SeriousBroker.it  This test is not yet approved or cleared by the [place] FDA and  has been authorized for detection and/or diagnosis of SARS-CoV-2 by FDA under an Emergency Use Authorization (EUA).  This EUA will remain in effect (meaning this test can be used) for the duration of  the COVID-19 declaration under Section 564(b)(1) of the A ct, 21 U.S.C.  section 360bbb-3(b)(1), unless the authorization is terminated or revoked sooner.     Influenza A by PCR NEGATIVE NEGATIVE Final   Influenza B by PCR NEGATIVE NEGATIVE Final    Comment: (NOTE) The Xpert Xpress SARS-CoV-2/FLU/RSV plus assay is intended as an aid in the diagnosis of influenza from Nasopharyngeal swab specimens and should not be used as a sole basis for treatment. Nasal washings and aspirates are unacceptable for Xpert Xpress SARS-CoV-2/FLU/RSV testing.  Fact Sheet for Patients: BloggerCourse.com  Fact Sheet for Healthcare Providers: SeriousBroker.it  This test is not yet approved or cleared by the [place] FDA and has been authorized for detection and/or diagnosis of SARS-CoV-2 by FDA under an Emergency Use Authorization (EUA). This EUA will remain in effect (meaning  this test can be used) for the duration of the COVID-19 declaration under Section 564(b)(1) of the Act, 21 U.S.C. section 360bbb-3(b)(1), unless the authorization is terminated or revoked.  Performed at [facility], [address], [place], [place] [address]      Radiology Studies: DG Chest Portable 1 View  Result Date: 03/22/2021 CLINICAL DATA:  COVID-19 positivity with fevers, initial encounter EXAM: PORTABLE CHEST 1 VIEW COMPARISON:  08/29/2019 FINDINGS: Cardiac shadow is enlarged but stable. Pacing device is again seen. The lungs are clear bilaterally. No infiltrate is noted. Multiple skin folds are noted over the right chest. No bony abnormality is seen. IMPRESSION: No acute abnormality noted Electronically Signed   By: Alcide Clever M.D.   On: 03/22/2021 01:26    Scheduled Meds:  sodium chloride   Intravenous Once   vitamin C  500 mg Oral Daily   atorvastatin  20 mg Oral QHS   digoxin  0.125 mg Oral QODAY   famotidine  20 mg Oral BID   insulin aspart  0-9 Units Subcutaneous Q4H   isosorbide mononitrate  30 mg Oral Daily   metoprolol succinate  50 mg Oral Daily   mirtazapine  7.5 mg Oral QHS   multivitamin-lutein   Oral BID   [START ON 03/25/2021] pantoprazole  40 mg Intravenous Q12H   pioglitazone  15 mg Oral Daily   potassium chloride  40 mEq Oral Daily   sucralfate  1 g Oral QID   zinc sulfate  220 mg Oral Daily   Continuous Infusions:  sodium chloride Stopped (03/22/21 1247)   pantoprazole 8 mg/hr (03/22/21 0337)   [START ON 03/23/2021] remdesivir 100 mg in NS 100 mL       LOS: 0 days   Time spent:  More than 50% of the time was spent in counseling/coordination of care  Arnetha Courser, MD [facility]  If 7PM-7AM, please contact night-coverage [email or web address]  03/22/2021, 2:59 PM   This record has been created using Conservation officer, historic buildings. Errors have been sought and corrected,but may not always be located. Such creation errors do  not reflect on the standard of care.

## 2021-03-22 NOTE — ED Notes (Signed)
RN to bedside. Pt is alert and awake and in no distress.
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
## 2021-03-22 NOTE — ED Notes (Signed)
Pt belongings moving upstairs with pt :  Red bag with pill box and clothes Belongings bag with dirty clothes Purple purse with wallet, cell phone and charger

## 2021-03-22 NOTE — H&P (Signed)
PATIENT NAME: Elizabeth Novak    MR#:  [id number]  DATE OF BIRTH:  1923-06-22  DATE OF ADMISSION:  03/22/2021  PRIMARY CARE PHYSICIAN: Danella Penton, MD   Patient is coming from: Home  REQUESTING/REFERRING PHYSICIAN: Nita Sickle, MD  CHIEF COMPLAINT:   Chief Complaint  Patient presents with   Fever   Weakness    HISTORY OF PRESENT ILLNESS:  Elizabeth Novak is a 87 y.o. female with medical history significant for type 2 diabetes mellitus, CHF, diverticulitis, GERD, dyslipidemia and atrial fibrillation on Eliquis, who presented to the emergency room with a [place] of cough with associated mild dyspnea that worsened today.  She has been having intermittent melena over the last couple weeks and noted bright red blood when she was wiping twice.  No fever or chills or abdominal pain.  No chest pain or palpitations.  No dysuria, oliguria or hematuria or flank pain.  ED Course: When she came to the [facility] pressure was 130/91 with otherwise normal vital signs.  Labs  with albumin 3.3 and troponin 6 with otherwise unremarkable CMP.  BNP was 383.7 and LDH 143 with high-sensitivity troponin I of 11.  Ferritin was normal at 13 and procalcitonin less than 0.1.  CBC showed anemia with hemoglobin of 8.6 and hematocrit 27.8 compared to 11.6 and 36.1 on /02/02/2021.  COVID-19 PCR came back positive and influenza antigens were negative.  UA showed more than 500 glucose.   EKG as reviewed by me : EKG showed atrial fibrillation with controlled ventricular sponsor 78 with incomplete right bundle branch block and left anterior fascicular block Imaging: Chest x-ray showed no acute cardiopulmonary disease.  The patient was given IV remdesivir and IV Protonix bolus and drip.  She was typed and crossmatched for 2 units of packed red blood cells.  She will be admitted to a PCU bed for further evaluation and management. PAST MEDICAL HISTORY:   Past Medical History:  Diagnosis  Date   A-fib (HCC)    Anemia    Arthritis    Breast cancer (HCC) 1978   left breast with lymph node removal   CHF (congestive heart failure) (HCC)    Diabetes mellitus without complication (HCC)    Diverticulitis    Dysrhythmia    GERD (gastroesophageal reflux disease)    Hyperlipidemia    Macular degeneration of both eyes    Mitral valve prolapse    Mitral valve regurgitation    Presence of permanent cardiac pacemaker     PAST SURGICAL HISTORY:   Past Surgical History:  Procedure Laterality Date   ABDOMINAL HYSTERECTOMY     APPENDECTOMY     BREAST SURGERY     CARDIAC CATHETERIZATION     ESOPHAGEAL DILATION     EYE SURGERY Bilateral    Cataract Extraction with IOL   INSERT / REPLACE / REMOVE PACEMAKER     IRRIGATION AND DEBRIDEMENT HEMATOMA Left 08/21/2015   Procedure: IRRIGATION AND DEBRIDEMENT HEMATOMA;  Surgeon: Earline Mayotte, MD;  Location: [facility] ORS;  Service: General;  Laterality: Left;   KNEE ARTHROSCOPY Right    LEFT OOPHORECTOMY Left    MASTECTOMY Left 1978   MASTECTOMY Right 1978   OPEN REDUCTION INTERNAL FIXATION (ORIF) DISTAL RADIAL FRACTURE Right 02/14/2018   Procedure: OPEN REDUCTION INTERNAL FIXATION (ORIF) DISTAL RADIAL FRACTURE;  Surgeon: Kennedy Bucker, MD;  Location: [facility] ORS;  Service: Orthopedics;  Laterality: Right;   PACEMAKER INSERTION  08/11/12   PPM  GENERATOR CHANGEOUT N/A 05/02/2020   Procedure: PPM GENERATOR CHANGEOUT;  Surgeon: Marcina Millard, MD;  Location: [facility] INVASIVE CV LAB;  Service: Cardiovascular;  Laterality: N/A;   TEMPORAL ARTERY BIOPSY / LIGATION     TONSILLECTOMY      SOCIAL HISTORY:   Social History   Tobacco Use   Smoking status: Never   Smokeless tobacco: Never  Substance Use Topics   Alcohol use: No    Alcohol/week: 0.0 standard drinks    FAMILY HISTORY:   Family History  Problem Relation Age of Onset   Hypertension Mother     DRUG ALLERGIES:   Allergies  Allergen Reactions   Codeine Nausea Only    Disopyramide     Other reaction(s): Unknown   Ibuprofen Diarrhea   Iodine     blisters   Metformin And Related Other (See Comments)    unknown   Norpace [Disopyramide Phosphate]    Quinidine     Other reaction(s): Unknown   Terfenadine     Other reaction(s): Unknown   Topiramate     Other reaction(s): Other (See Comments) Hair loss   Verapamil     Other reaction(s): Unknown   Dexamethasone Sodium Phosphate Palpitations    REVIEW OF SYSTEMS:   ROS As per history of present illness. All pertinent systems were reviewed above. Constitutional, HEENT, cardiovascular, respiratory, GI, GU, musculoskeletal, neuro, psychiatric, endocrine, integumentary and hematologic systems were reviewed and are otherwise negative/unremarkable except for positive findings mentioned above in the HPI.   MEDICATIONS AT HOME:   Prior to Admission medications   Medication Sig Start Date End Date Taking? Authorizing Provider  apixaban (ELIQUIS) 2.5 MG TABS tablet Take 1 tablet (2.5 mg total) by mouth 2 (two) times daily. 05/10/18  Yes Sharee Holster, NP  atorvastatin (LIPITOR) 20 MG tablet Take 1 tablet (20 mg total) by mouth at bedtime. 05/10/18  Yes Sharee Holster, NP  digoxin (LANOXIN) 0.125 MG tablet Take 1 tablet (0.125 mg total) by mouth every other day. CHF- * Hold for HR < 60 05/10/18  Yes Sharee Holster, NP  famotidine (PEPCID) 20 MG tablet Take 20 mg by mouth at bedtime. 08/24/19  Yes [provider]  glipiZIDE (GLUCOTROL XL) 10 MG 24 hr tablet Take 10 mg by mouth daily with breakfast.   Yes [provider]  isosorbide mononitrate (IMDUR) 30 MG 24 hr tablet Take 30 mg by mouth daily. 06/05/19  Yes [provider]  metoprolol succinate (TOPROL-XL) 50 MG 24 hr tablet Take 1 tablet (50 mg total) by mouth daily. 05/10/18  Yes Sharee Holster, NP  mirtazapine (REMERON) 15 MG tablet Take 7.5 mg by mouth at bedtime.   Yes [provider]  Multiple Vitamins-Minerals  (PRESERVISION AREDS 2 PO) Take 1 tablet by mouth 2 (two) times daily.   Yes [provider]  omeprazole (PRILOSEC) 20 MG capsule Take 20 mg by mouth 2 (two) times daily. 12/25/19  Yes [provider]  pioglitazone (ACTOS) 15 MG tablet Take 15 mg by mouth daily. 02/25/21  Yes [provider]  potassium chloride (KLOR-CON) 10 MEQ tablet Take 40 mEq by mouth daily. 11/29/19  Yes [provider]  sucralfate (CARAFATE) 1 g tablet Take 1 g by mouth 4 (four) times daily. 03/14/21  Yes [provider]  torsemide (DEMADEX) 20 MG tablet Take 20 mg by mouth 2 (two) times daily. 12/25/19  Yes [provider]  amLODipine (NORVASC) 5 MG tablet Take 5 mg by mouth  at bedtime. Patient not taking: Reported on 03/22/2021 01/31/21   [provider]  carboxymethylcellulose (REFRESH PLUS) 0.5 % SOLN 1 drop 3 (three) times daily as needed.    [provider]  metolazone (ZAROXOLYN) 2.5 MG tablet Take 2.5 mg by mouth once a week. Patient not taking: Reported on 03/22/2021 12/26/20   [provider]  nitroGLYCERIN (NITROSTAT) 0.4 MG SL tablet Place 0.4 mg under the tongue every 5 (five) minutes as needed for chest pain. 03/08/19   [provider]      VITAL SIGNS:  Blood pressure (!) 145/83, pulse 87, temperature 98.8 F (37.1 C), temperature source Oral, resp. rate 17, height 5' (1.524 m), weight 45.4 kg, SpO2 (!) 87 %.  PHYSICAL EXAMINATION:  Physical Exam  GENERAL:  87 y.o.-year-old Caucasian female patient lying in the bed with no acute distress.  EYES: Pupils equal, round, reactive to light and accommodation. No scleral icterus.  Positive pallor.  Extraocular muscles intact.  HEENT: Head atraumatic, normocephalic. Oropharynx and nasopharynx clear.  NECK:  Supple, no jugular venous distention. No thyroid enlargement, no tenderness.  LUNGS: Slightly diminished bibasilar breath sounds.  No use of accessory muscles of respiration.   CARDIOVASCULAR: Regular rate and rhythm, S1, S2 normal. No murmurs, rubs, or gallops.  ABDOMEN: Soft, nondistended, nontender. Bowel sounds present. No organomegaly or mass.  EXTREMITIES: No pedal edema, cyanosis, or clubbing.  NEUROLOGIC: Cranial nerves II through XII are intact. Muscle strength 5/5 in all extremities. Sensation intact. Gait not checked.  PSYCHIATRIC: The patient is alert and oriented x 3.  Normal affect and good eye contact. SKIN: No obvious rash, lesion, or ulcer.   LABORATORY PANEL:   CBC Recent Labs  Lab 03/21/21 2027  WBC 5.1  HGB 8.6*  HCT 27.8*  PLT 229   ------------------------------------------------------------------------------------------------------------------  Chemistries  Recent Labs  Lab 03/21/21 2027  NA 135  K 3.7  CL 101  CO2 30  GLUCOSE 293*  BUN 21  CREATININE 0.79  CALCIUM 9.0  MG 2.1  AST 20  ALT 17  ALKPHOS 54  BILITOT 0.8   ------------------------------------------------------------------------------------------------------------------  Cardiac Enzymes No results for input(s): TROPONINI in the last 168 hours. ------------------------------------------------------------------------------------------------------------------  RADIOLOGY:  DG Chest Portable 1 View  Result Date: 03/22/2021 CLINICAL DATA:  COVID-19 positivity with fevers, initial encounter EXAM: PORTABLE CHEST 1 VIEW COMPARISON:  08/29/2019 FINDINGS: Cardiac shadow is enlarged but stable. Pacing device is again seen. The lungs are clear bilaterally. No infiltrate is noted. Multiple skin folds are noted over the right chest. No bony abnormality is seen. IMPRESSION: No acute abnormality noted Electronically Signed   By: Alcide Clever M.D.   On: 03/22/2021 01:26      IMPRESSION AND PLAN:  Principal Problem:   GI bleeding  1.  GI bleeding with subsequent blood loss anemia. - The patient will be admitted to a PCU bed. - She was typed and crossmatched and  with further drop of hemoglobin will be transfused 2 units of packed red blood cells. - We will follow posttransfusion H&H. - We will continue her on IV Protonix drip. - We will continue her Carafate. - Eliquis will obviously be held off. - We will obtain a GI consultation. - I notified Dr. Timothy Lasso about the patient.  2.  COVID-19 infection. - Should be placed on IV remdesivir. - We will follow inflammatory markers. - Should be given vitamin D3 and zinc sulfate.  We will avoid vitamin C given her GI bleeding likely of upper  GI etiology.  3.  Essential hypertension. - We will continue Norvasc.  4.  Dyslipidemia. - Continue statin therapy.  5.  Coronary artery disease. - We will continue statin therapy, Imdur, Toprol-XL and as needed sublingual nitroglycerin.  6.  Paroxysmal atrial fibrillation. - We will continue digoxin and Toprol-XL.  Eliquis will be held off.   DVT prophylaxis: S CDs.  Medical prophylaxis contraindicated due to GI bleeding.  Code Status: full code. Family Communication:  The plan of care was discussed in details with the patient (and family). I answered all questions. The patient agreed to proceed with the above mentioned plan. Further management will depend upon hospital course. Disposition Plan: Back to previous home environment Consults called: Gastroenterology. All the records are reviewed and case discussed with ED provider.  Status is: Inpatient    Remains inpatient appropriate because:Ongoing diagnostic testing needed not appropriate for outpatient work up, Unsafe d/c plan, IV treatments appropriate due to intensity of illness or inability to take PO, and Inpatient level of care appropriate due to severity of illness   Dispo: The patient is from: Home              Anticipated d/c is to: Home              Patient currently is not medically stable to d/c.              Difficult to place patient: No      Hannah Beat M.D on 03/22/2021 at 1:42  AM  [facility]   From 7 PM-7 AM, contact night-coverage [email or web address]  CC: Primary care physician; Danella Penton, MD

## 2021-03-23 DIAGNOSIS — U071 COVID-19: Secondary | ICD-10-CM

## 2021-03-23 LAB — VITAMIN B12: Vitamin B-12: 240 pg/mL (ref 180–914)

## 2021-03-23 LAB — GLUCOSE, CAPILLARY
Glucose-Capillary: 162 mg/dL — ABNORMAL HIGH (ref 70–99)
Glucose-Capillary: 183 mg/dL — ABNORMAL HIGH (ref 70–99)
Glucose-Capillary: 239 mg/dL — ABNORMAL HIGH (ref 70–99)
Glucose-Capillary: 78 mg/dL (ref 70–99)
Glucose-Capillary: 98 mg/dL (ref 70–99)

## 2021-03-23 LAB — CBC WITH DIFFERENTIAL/PLATELET
Abs Immature Granulocytes: 0.01 10*3/uL (ref 0.00–0.07)
Basophils Absolute: 0 10*3/uL (ref 0.0–0.1)
Basophils Relative: 0 %
Eosinophils Absolute: 0 10*3/uL (ref 0.0–0.5)
Eosinophils Relative: 1 %
HCT: 28.1 % — ABNORMAL LOW (ref 36.0–46.0)
Hemoglobin: 8.9 g/dL — ABNORMAL LOW (ref 12.0–15.0)
Immature Granulocytes: 0 %
Lymphocytes Relative: 30 %
Lymphs Abs: 0.9 10*3/uL (ref 0.7–4.0)
MCH: 24.9 pg — ABNORMAL LOW (ref 26.0–34.0)
MCHC: 31.7 g/dL (ref 30.0–36.0)
MCV: 78.5 fL — ABNORMAL LOW (ref 80.0–100.0)
Monocytes Absolute: 0.6 10*3/uL (ref 0.1–1.0)
Monocytes Relative: 21 %
Neutro Abs: 1.4 10*3/uL — ABNORMAL LOW (ref 1.7–7.7)
Neutrophils Relative %: 48 %
Platelets: 212 10*3/uL (ref 150–400)
RBC: 3.58 MIL/uL — ABNORMAL LOW (ref 3.87–5.11)
RDW: 14.7 % (ref 11.5–15.5)
WBC: 3 10*3/uL — ABNORMAL LOW (ref 4.0–10.5)
nRBC: 0 % (ref 0.0–0.2)

## 2021-03-23 LAB — COMPREHENSIVE METABOLIC PANEL
ALT: 16 U/L (ref 0–44)
AST: 19 U/L (ref 15–41)
Albumin: 2.8 g/dL — ABNORMAL LOW (ref 3.5–5.0)
Alkaline Phosphatase: 45 U/L (ref 38–126)
Anion gap: 6 (ref 5–15)
BUN: 13 mg/dL (ref 8–23)
CO2: 30 mmol/L (ref 22–32)
Calcium: 8.4 mg/dL — ABNORMAL LOW (ref 8.9–10.3)
Chloride: 105 mmol/L (ref 98–111)
Creatinine, Ser: 0.68 mg/dL (ref 0.44–1.00)
GFR, Estimated: >60 mL/min (ref >60)
Glucose, Bld: 86 mg/dL (ref 70–99)
Potassium: 2.7 mmol/L — CL (ref 3.5–5.1)
Sodium: 141 mmol/L (ref 135–145)
Total Bilirubin: 0.6 mg/dL (ref 0.3–1.2)
Total Protein: 5.1 g/dL — ABNORMAL LOW (ref 6.5–8.1)

## 2021-03-23 LAB — D-DIMER, QUANTITATIVE: D-Dimer, Quant: 2.84 ug/mL-FEU — ABNORMAL HIGH (ref 0.00–0.50)

## 2021-03-23 LAB — C-REACTIVE PROTEIN
CRP: 0.9 mg/dL (ref <1.0)
CRP: 1.3 mg/dL — ABNORMAL HIGH (ref <1.0)

## 2021-03-23 LAB — FERRITIN: Ferritin: 16 ng/mL (ref 11–307)

## 2021-03-23 LAB — MAGNESIUM: Magnesium: 1.9 mg/dL (ref 1.7–2.4)

## 2021-03-23 MED ORDER — VITAMIN B-12 1000 MCG PO TABS
1000.0000 ug | ORAL_TABLET | Freq: Every day | ORAL | Status: DC
  Administered 2021-03-23 – 2021-03-24 (×2): 1000 ug via ORAL
  Filled 2021-03-23 (×2): qty 1

## 2021-03-23 MED ORDER — POTASSIUM CHLORIDE CRYS ER 20 MEQ PO TBCR
40.0000 meq | EXTENDED_RELEASE_TABLET | ORAL | Status: AC
  Administered 2021-03-23 (×2): 40 meq via ORAL
  Filled 2021-03-23: qty 2

## 2021-03-23 MED ORDER — SODIUM CHLORIDE 0.9 % IV SOLN
510.0000 mg | Freq: Once | INTRAVENOUS | Status: AC
  Administered 2021-03-23: 12:00:00 510 mg via INTRAVENOUS
  Filled 2021-03-23: qty 17

## 2021-03-23 MED ORDER — PANTOPRAZOLE SODIUM 40 MG IV SOLR
40.0000 mg | Freq: Two times a day (BID) | INTRAVENOUS | Status: DC
  Administered 2021-03-23 (×2): 40 mg via INTRAVENOUS
  Filled 2021-03-23 (×2): qty 40

## 2021-03-23 NOTE — Progress Notes (Signed)
Inpatient Follow-up/Progress Note   Patient ID: Elizabeth Novak is a 87 y.o. female.  Overnight Events / Subjective Findings NAEON. Hgb improved appropriately after 1 u prbc transfusion. Stool remains brown. No signs of GIB. Eliquis still held. Tolerating po. Awaiting UGI study. Feels weak and fatigued. No other acute gi complaints.  Review of Systems  Constitutional:  Positive for fatigue. Negative for activity change, appetite change, chills, diaphoresis, fever and unexpected weight change.  HENT:  Negative for trouble swallowing and voice change.   Respiratory:  Negative for shortness of breath and wheezing.   Cardiovascular:  Negative for chest pain, palpitations and leg swelling.  Gastrointestinal:  Negative for abdominal distention, abdominal pain, anal bleeding, blood in stool, constipation, diarrhea, nausea, rectal pain and vomiting.  Musculoskeletal:  Negative for arthralgias and myalgias.  Skin:  Negative for color change and pallor.  Neurological:  Positive for weakness. Negative for dizziness and syncope.  Psychiatric/Behavioral:  Negative for confusion.   All other systems reviewed and are negative.   Medications  Current Facility-Administered Medications:    0.9 %  sodium chloride infusion (Manually program via Guardrails IV Fluids), , Intravenous, Once, Mansy, Jan A, MD   acetaminophen (TYLENOL) tablet 650 mg, 650 mg, Oral, Q6H PRN, Mansy, Jan A, MD   ascorbic acid (VITAMIN C) tablet 500 mg, 500 mg, Oral, Daily, Mansy, Jan A, MD, 500 mg at 03/23/21 0901   atorvastatin (LIPITOR) tablet 20 mg, 20 mg, Oral, QHS, Mansy, Jan A, MD, 20 mg at 03/22/21 2211   digoxin (LANOXIN) tablet 0.125 mg, 0.125 mg, Oral, QODAY, Mansy, Jan A, MD   guaiFENesin-dextromethorphan (ROBITUSSIN DM) 100-10 MG/5ML syrup 10 mL, 10 mL, Oral, Q4H PRN, Mansy, Jan A, MD, 10 mL at 03/23/21 0902   insulin aspart (novoLOG) injection 0-9 Units, 0-9 Units, Subcutaneous, Q4H, Lorella Nimrod, MD, 2 Units at  03/22/21 2210   isosorbide mononitrate (IMDUR) 24 hr tablet 30 mg, 30 mg, Oral, Daily, Mansy, Jan A, MD, 30 mg at 03/23/21 0901   magnesium hydroxide (MILK OF MAGNESIA) suspension 30 mL, 30 mL, Oral, Daily PRN, Mansy, Jan A, MD   metoprolol succinate (TOPROL-XL) 24 hr tablet 50 mg, 50 mg, Oral, Daily, Mansy, Jan A, MD, 50 mg at 03/23/21 0901   mirtazapine (REMERON) tablet 7.5 mg, 7.5 mg, Oral, QHS, Mansy, Jan A, MD, 7.5 mg at 03/22/21 2211   multivitamin-lutein (OCUVITE-LUTEIN) capsule, , Oral, BID, Mansy, Jan A, MD, 1 capsule at 03/23/21 0902   nitroGLYCERIN (NITROSTAT) SL tablet 0.4 mg, 0.4 mg, Sublingual, Q5 min PRN, Mansy, Jan A, MD   ondansetron (ZOFRAN) tablet 4 mg, 4 mg, Oral, Q6H PRN **OR** ondansetron (ZOFRAN) injection 4 mg, 4 mg, Intravenous, Q6H PRN, Mansy, Jan A, MD   pantoprazole (PROTONIX) injection 40 mg, 40 mg, Intravenous, Q12H, Annamaria Helling, DO, 40 mg at 03/23/21 1139   pioglitazone (ACTOS) tablet 15 mg, 15 mg, Oral, Daily, Mansy, Jan A, MD, 15 mg at 03/23/21 9379   polyvinyl alcohol (LIQUIFILM TEARS) 1.4 % ophthalmic solution 1 drop, 1 drop, Both Eyes, TID PRN, Mansy, Jan A, MD   potassium chloride SA (KLOR-CON M) CR tablet 40 mEq, 40 mEq, Oral, Q2H, Lorella Nimrod, MD   [COMPLETED] remdesivir 200 mg in sodium chloride 0.9% 250 mL IVPB, 200 mg, Intravenous, Once, Stopped at 03/22/21 0203 **FOLLOWED BY** remdesivir 100 mg in sodium chloride 0.9 % 100 mL IVPB, 100 mg, Intravenous, Daily, Renda Rolls, RPH, Last Rate: 200 mL/hr at 03/23/21 0903, 100 mg  at 03/23/21 0903   sucralfate (CARAFATE) tablet 1 g, 1 g, Oral, QID, Mansy, Jan A, MD, 1 g at 03/23/21 0901   torsemide (DEMADEX) tablet 20 mg, 20 mg, Oral, BID, Lorella Nimrod, MD, 20 mg at 03/23/21 0901   traZODone (DESYREL) tablet 25 mg, 25 mg, Oral, QHS PRN, Mansy, Jan A, MD   vitamin B-12 (CYANOCOBALAMIN) tablet 1,000 mcg, 1,000 mcg, Oral, Daily, Lorella Nimrod, MD, 1,000 mcg at 03/23/21 0901   zinc sulfate capsule 220  mg, 220 mg, Oral, Daily, Mansy, Jan A, MD, 220 mg at 03/23/21 0901  remdesivir 100 mg in NS 100 mL 100 mg (03/23/21 0903)    acetaminophen, guaiFENesin-dextromethorphan, magnesium hydroxide, nitroGLYCERIN, ondansetron **OR** ondansetron (ZOFRAN) IV, polyvinyl alcohol, traZODone   Objective    Vitals:   03/23/21 0033 03/23/21 0410 03/23/21 0900 03/23/21 1100  BP: (!) 124/55 137/70 135/89 119/84  Pulse: 78 70 75 78  Resp: 16 18 18    Temp: 98.3 F (36.8 C) 98.6 F (37 C) 98.6 F (37 C) 97.8 F (36.6 C)  TempSrc: Oral Oral    SpO2: 98% 98% 97% 96%  Weight:      Height:         Physical Exam Vitals and nursing note reviewed.  Constitutional:      General: She is not in acute distress.    Appearance: She is not ill-appearing, toxic-appearing or diaphoretic.     Comments:  Frail appearing   HENT:     Head: Normocephalic and atraumatic.     Ears:     Comments: HOH    Nose: Nose normal.     Mouth/Throat:     Mouth: Mucous membranes are moist.     Pharynx: Oropharynx is clear.  Eyes:     General: No scleral icterus.    Extraocular Movements: Extraocular movements intact.  Cardiovascular:     Rate and Rhythm: Normal rate and regular rhythm.     Heart sounds: Normal heart sounds. No murmur heard.   No friction rub. No gallop.  Pulmonary:     Effort: Pulmonary effort is normal. No respiratory distress.     Breath sounds: Normal breath sounds. No wheezing, rhonchi or rales.     Comments: Diminished breath sounds bilaterally Abdominal:     General: Abdomen is flat. Bowel sounds are normal. There is no distension.     Palpations: Abdomen is soft.     Tenderness: There is no abdominal tenderness. There is no guarding or rebound.  Musculoskeletal:     Cervical back: Neck supple.     Right lower leg: Edema present.     Left lower leg: Edema present.  Skin:    General: Skin is warm and dry.     Coloration: Skin is not jaundiced or pale.  Neurological:     General: No focal  deficit present.     Mental Status: She is alert and oriented to person, place, and time. Mental status is at baseline.  Psychiatric:        Mood and Affect: Mood normal.        Behavior: Behavior normal.        Thought Content: Thought content normal.        Judgment: Judgment normal.   Laboratory Data Recent Labs  Lab 03/21/21 2027 03/22/21 0338 03/22/21 1741 03/22/21 2323 03/23/21 0731  WBC 5.1  --   --   --  3.0*  HGB 8.6*   < > 9.0* 9.0* 8.9*  HCT  27.8*   < > 28.6* 28.3* 28.1*  PLT 229  --   --   --  212  NEUTOPHILPCT  --   --   --   --  48  LYMPHOPCT  --   --   --   --  30  MONOPCT  --   --   --   --  21  EOSPCT  --   --   --   --  1   < > = values in this interval not displayed.   Recent Labs  Lab 03/21/21 2027 03/23/21 0731  NA 135 141  K 3.7 2.7*  CL 101 105  CO2 30 30  BUN 21 13  CREATININE 0.79 0.68  CALCIUM 9.0 8.4*  PROT 6.0* 5.1*  BILITOT 0.8 0.6  ALKPHOS 54 45  ALT 17 16  AST 20 19  GLUCOSE 293* 86   Recent Labs  Lab 03/21/21 2027  INR 1.1      Imaging Studies: DG Chest Portable 1 View  Result Date: 03/22/2021 CLINICAL DATA:  COVID-19 positivity with fevers, initial encounter EXAM: PORTABLE CHEST 1 VIEW COMPARISON:  08/29/2019 FINDINGS: Cardiac shadow is enlarged but stable. Pacing device is again seen. The lungs are clear bilaterally. No infiltrate is noted. Multiple skin folds are noted over the right chest. No bony abnormality is seen. IMPRESSION: No acute abnormality noted Electronically Signed   By: Inez Catalina M.D.   On: 03/22/2021 01:26    Assessment:   # Acute on chronic anemia; iron deficient - Hgb on presentation was 8.6. now 7.9; was 11.6 two months ago             - MCV 84  - improved appropriately to 9.0 after 1u prbc transfusion - has had some lower extremity sewlling post transfusion - bun/cr 21/0.7- at baseline - nursing has noted large brown stool- no melena; stool brown again today (12/18) - ferritin 13; inr 13 -  EGD/Colonoscopy 01/2013. Colon Polyps- TA, gastric polyps   # COVID-19 pna - receiving remdesivir   # afib on eliquis - reports it has been held for several days   # SSS s/p ppm   # DM2   # CVA  Plan:  Awaiting upper gi study tomorrow Eating today w/o issue. Npo at midnight Brown bm Protonix 40 mg iv bid Eliquis being held Monitor H&H; transfusion and resuscitation as per primary team; hgb stable post transfusion Avoid frequent lab draws to prevent lab induced anemia Maintain two iv sites Avoid nsaid Attempted to call patient's nephew to update- left VM for callback  Given age and presentation with dyspnea and cough with covid 19 + pna, d/w patient and nephew who all agreed to conservative management at this time.   Will follow up UGI study tomorrow  I personally performed the service.  Management of other medical comorbidities as per primary team  Thank you for allowing [place] to participate in this patient's care. Please don't hesitate to call if any questions or concerns arise.   Annamaria Helling, DO [facility]  Portions of the record may have been created with voice recognition software. Occasional wrong-word or 'sound-a-like' substitutions may have occurred due to the inherent limitations of voice recognition software.  Read the chart carefully and recognize, using context, where substitutions may have occurred.

## 2021-03-23 NOTE — Progress Notes (Signed)
K+ 2.7/ MD made aware.

## 2021-03-23 NOTE — Progress Notes (Signed)
PROGRESS NOTE    Elizabeth Novak  UEA:[id number] DOB: [birth year] DOA: 03/22/2021 PCP: Danella Penton, MD   Brief Narrative: Taken from H&P. Elizabeth Novak is a 87 y.o. female with medical history significant for type 2 diabetes mellitus, CHF, diverticulitis, GERD, dyslipidemia and atrial fibrillation on Eliquis, who presented to the emergency room with complaint of worsening cough and mild dyspnea. She was also experiencing melena for the past couple of weeks.  Apparently PCP told her to hold Eliquis for 7 days??  Not sure whether she was still taking it.  She was unable to tell about any NSAID.  Denies any abdominal pain.  Had a bowel movement in ED which was brown. Hemoglobin at 8.6 which dropped to 7.9 this morning.  It was 11.6 in October 2022. GI was consulted-they ordered upper GI series with nuclear medicine which will be done on Monday. Blood transfusion ordered by admitting provider. Also tested positive for COVID-19.  Chest x-ray without any acute abnormality.  Started on remdesivir  12/18: No more melanotic stools.  Hemoglobin currently stable.  Received 1 unit of PRBC.  Iron studies with some iron deficiency, ordered 1 dose of Feraheme.  Upper GI series tomorrow, n.p.o. after midnight  Subjective: Patient continues to have some cough and feeling weak.  Very hard of hearing.  Assessment & Plan:   Principal Problem:   GI bleeding  Blood loss anemia secondary to GI bleed.  Symptoms concerning for GI bleed going on for couple of weeks with significant drop of hemoglobin from 11.6 in October which was decreased to 7.9 on admission and now at 8.9 s/p 1 unit of PRBC.  No significant change in BUN.  Not sure when she stopped Eliquis.  No more melena noted.  Iron studies with some iron deficiency.  Borderline low B12 at 240 -Give her 1 dose of Feraheme -Start her on B12 and iron supplement -GI is planning to do conservative management and upper GI series on Monday. -N.p.o.  after midnight -Continue with IV Protonix twice daily -Discontinue Eliquis.  Paroxysmal atrial fibrillation.  EKG with A. fib and incomplete right bundle branch block.  Rate seems well controlled. -Continue home dose of digoxin and Toprol-XL. -Discontinue Eliquis  COVID-19 infection.  Pretty much asymptomatic except mild cough.  On room air -Continue with remdesivir-can get 3 days  -Continue with supplement and supportive care  History of CAD.  No chest pain. -Continue home dose of statin, Imdur and Toprol  Lower extremity edema.  No recent echo in the chart.  Lung seems clear.  BNP elevated at 383 -Continue home dose of torsemide -Monitor BMP  Essential hypertension.  Blood pressure within goal. -Continue home dose of Norvasc  Dyslipidemia. -Continue home statin  Objective: Vitals:   03/23/21 0033 03/23/21 0410 03/23/21 0900 03/23/21 1100  BP: (!) 124/55 137/70 135/89 119/84  Pulse: 78 70 75 78  Resp: 16 18 18    Temp: 98.3 F (36.8 C) 98.6 F (37 C) 98.6 F (37 C) 97.8 F (36.6 C)  TempSrc: Oral Oral    SpO2: 98% 98% 97% 96%  Weight:      Height:        Intake/Output Summary (Last 24 hours) at 03/23/2021 1621 Last data filed at 03/23/2021 0303 Gross per 24 hour  Intake 1577.29 ml  Output --  Net 1577.29 ml    Filed Weights   03/21/21 2019  Weight: 45.4 kg    Examination:  General.  Frail elderly lady, in  no acute distress.  Very hard of hearing Pulmonary.  Lungs clear bilaterally, normal respiratory effort. CV.  Regular rate and rhythm, no JVD, rub or murmur. Abdomen.  Soft, nontender, nondistended, BS positive. CNS.  Alert and oriented .  No focal neurologic deficit. Extremities.  2+ LE Odema, no cyanosis, pulses intact and symmetrical. Psychiatry.  Judgment and insight appears normal.   DVT prophylaxis: SCDs, GI bleed Code Status: Full Family Communication:  Disposition Plan:  Status is: Inpatient  Remains inpatient appropriate because:  Severity of illness   Level of care: Progressive  All the records are reviewed and case discussed with Care Management/Social Worker. Management plans discussed with the patient, nursing and they are in agreement.  Consultants:  Gastroenterology  Procedures:  Antimicrobials:   Data Reviewed: I have personally reviewed following labs and imaging studies  CBC: Recent Labs  Lab 03/21/21 2027 03/22/21 0338 03/22/21 1741 03/22/21 2323 03/23/21 0731  WBC 5.1  --   --   --  3.0*  NEUTROABS  --   --   --   --  1.4*  HGB 8.6* 7.9* 9.0* 9.0* 8.9*  HCT 27.8* 25.4* 28.6* 28.3* 28.1*  MCV 80.1  --   --   --  78.5*  PLT 229  --   --   --  212    Basic Metabolic Panel: Recent Labs  Lab 03/21/21 2027 03/23/21 0731 03/23/21 0732  NA 135 141  --   K 3.7 2.7*  --   CL 101 105  --   CO2 30 30  --   GLUCOSE 293* 86  --   BUN 21 13  --   CREATININE 0.79 0.68  --   CALCIUM 9.0 8.4*  --   MG 2.1  --  1.9    GFR: Estimated Creatinine Clearance: 30.1 mL/min (by C-G formula based on SCr of 0.68 mg/dL). Liver Function Tests: Recent Labs  Lab 03/21/21 2027 03/23/21 0731  AST 20 19  ALT 17 16  ALKPHOS 54 45  BILITOT 0.8 0.6  PROT 6.0* 5.1*  ALBUMIN 3.3* 2.8*    No results for input(s): LIPASE, AMYLASE in the last 168 hours. No results for input(s): AMMONIA in the last 168 hours. Coagulation Profile: Recent Labs  Lab 03/21/21 2027  INR 1.1    Cardiac Enzymes: No results for input(s): CKTOTAL, CKMB, CKMBINDEX, TROPONINI in the last 168 hours. BNP (last 3 results) No results for input(s): PROBNP in the last 8760 hours. HbA1C: No results for input(s): HGBA1C in the last 72 hours. CBG: Recent Labs  Lab 03/22/21 1615 03/22/21 2105 03/23/21 0034 03/23/21 0412 03/23/21 1308  GLUCAP 192* 172* 98 78 239*    Lipid Profile: No results for input(s): CHOL, HDL, LDLCALC, TRIG, CHOLHDL, LDLDIRECT in the last 72 hours. Thyroid Function Tests: No results for input(s):  TSH, T4TOTAL, FREET4, T3FREE, THYROIDAB in the last 72 hours. Anemia Panel: Recent Labs    03/22/21 0338 03/22/21 1741 03/23/21 0731  VITAMINB12  --  240  --   FOLATE  --  22.1  --   FERRITIN 13  --  16  TIBC  --  340  --   IRON  --  19*  --   RETICCTPCT  --  1.5  --     Sepsis Labs: Recent Labs  Lab 03/22/21 0338  PROCALCITON <0.10     Recent Results (from the past 240 hour(s))  Resp Panel by RT-PCR (Flu A&B, Covid) Nasopharyngeal Swab  Status: Abnormal   Collection Time: 03/21/21  8:27 PM   Specimen: Nasopharyngeal Swab; Nasopharyngeal(NP) swabs in vial transport medium  Result Value Ref Range Status   SARS Coronavirus 2 by RT PCR POSITIVE (A) NEGATIVE Final    Comment: (NOTE) SARS-CoV-2 target nucleic acids are DETECTED.  The SARS-CoV-2 RNA is generally detectable in upper respiratory specimens during the acute phase of infection. Positive results are indicative of the presence of the identified virus, but do not rule out bacterial infection or co-infection with other pathogens not detected by the test. Clinical correlation with patient history and other diagnostic information is necessary to determine patient infection status. The expected result is Negative.  Fact Sheet for Patients: BloggerCourse.com  Fact Sheet for Healthcare Providers: SeriousBroker.it  This test is not yet approved or cleared by the [place] FDA and  has been authorized for detection and/or diagnosis of SARS-CoV-2 by FDA under an Emergency Use Authorization (EUA).  This EUA will remain in effect (meaning this test can be used) for the duration of  the COVID-19 declaration under Section 564(b)(1) of the A ct, 21 U.S.C. section 360bbb-3(b)(1), unless the authorization is terminated or revoked sooner.     Influenza A by PCR NEGATIVE NEGATIVE Final   Influenza B by PCR NEGATIVE NEGATIVE Final    Comment: (NOTE) The Xpert Xpress  SARS-CoV-2/FLU/RSV plus assay is intended as an aid in the diagnosis of influenza from Nasopharyngeal swab specimens and should not be used as a sole basis for treatment. Nasal washings and aspirates are unacceptable for Xpert Xpress SARS-CoV-2/FLU/RSV testing.  Fact Sheet for Patients: BloggerCourse.com  Fact Sheet for Healthcare Providers: SeriousBroker.it  This test is not yet approved or cleared by the [place] FDA and has been authorized for detection and/or diagnosis of SARS-CoV-2 by FDA under an Emergency Use Authorization (EUA). This EUA will remain in effect (meaning this test can be used) for the duration of the COVID-19 declaration under Section 564(b)(1) of the Act, 21 U.S.C. section 360bbb-3(b)(1), unless the authorization is terminated or revoked.  Performed at [facility], [address] [place], [place] [address]       Radiology Studies: DG Chest Portable 1 View  Result Date: 03/22/2021 CLINICAL DATA:  COVID-19 positivity with fevers, initial encounter EXAM: PORTABLE CHEST 1 VIEW COMPARISON:  08/29/2019 FINDINGS: Cardiac shadow is enlarged but stable. Pacing device is again seen. The lungs are clear bilaterally. No infiltrate is noted. Multiple skin folds are noted over the right chest. No bony abnormality is seen. IMPRESSION: No acute abnormality noted Electronically Signed   By: Alcide Clever M.D.   On: 03/22/2021 01:26    Scheduled Meds:  sodium chloride   Intravenous Once   vitamin C  500 mg Oral Daily   atorvastatin  20 mg Oral QHS   digoxin  0.125 mg Oral QODAY   insulin aspart  0-9 Units Subcutaneous Q4H   isosorbide mononitrate  30 mg Oral Daily   metoprolol succinate  50 mg Oral Daily   mirtazapine  7.5 mg Oral QHS   multivitamin-lutein   Oral BID   pantoprazole (PROTONIX) IV  40 mg Intravenous Q12H   pioglitazone  15 mg Oral Daily   sucralfate  1 g Oral QID   torsemide  20 mg Oral  BID   vitamin B-12  1,000 mcg Oral Daily   zinc sulfate  220 mg Oral Daily   Continuous Infusions:  remdesivir 100 mg in NS 100 mL 100 mg (03/23/21 0903)  LOS: 1 day   Time spent: 40 minutes. More than 50% of the time was spent in counseling/coordination of care  Arnetha Courser, MD [facility]  If 7PM-7AM, please contact night-coverage [email or web address]  03/23/2021, 4:21 PM   This record has been created using Conservation officer, historic buildings. Errors have been sought and corrected,but may not always be located. Such creation errors do not reflect on the standard of care.

## 2021-03-24 ENCOUNTER — Inpatient Hospital Stay: Payer: Medicare Other

## 2021-03-24 LAB — COMPREHENSIVE METABOLIC PANEL
ALT: 17 U/L (ref 0–44)
AST: 23 U/L (ref 15–41)
Albumin: 2.9 g/dL — ABNORMAL LOW (ref 3.5–5.0)
Alkaline Phosphatase: 54 U/L (ref 38–126)
Anion gap: 6 (ref 5–15)
BUN: 15 mg/dL (ref 8–23)
CO2: 32 mmol/L (ref 22–32)
Calcium: 8.6 mg/dL — ABNORMAL LOW (ref 8.9–10.3)
Chloride: 102 mmol/L (ref 98–111)
Creatinine, Ser: 0.68 mg/dL (ref 0.44–1.00)
GFR, Estimated: >60 mL/min (ref >60)
Glucose, Bld: 100 mg/dL — ABNORMAL HIGH (ref 70–99)
Potassium: 3.1 mmol/L — ABNORMAL LOW (ref 3.5–5.1)
Sodium: 140 mmol/L (ref 135–145)
Total Bilirubin: 0.9 mg/dL (ref 0.3–1.2)
Total Protein: 5.4 g/dL — ABNORMAL LOW (ref 6.5–8.1)

## 2021-03-24 LAB — GLUCOSE, CAPILLARY
Glucose-Capillary: 105 mg/dL — ABNORMAL HIGH (ref 70–99)
Glucose-Capillary: 109 mg/dL — ABNORMAL HIGH (ref 70–99)
Glucose-Capillary: 112 mg/dL — ABNORMAL HIGH (ref 70–99)
Glucose-Capillary: 120 mg/dL — ABNORMAL HIGH (ref 70–99)
Glucose-Capillary: 65 mg/dL — ABNORMAL LOW (ref 70–99)

## 2021-03-24 LAB — CBC WITH DIFFERENTIAL/PLATELET
Abs Immature Granulocytes: 0.01 10*3/uL (ref 0.00–0.07)
Basophils Absolute: 0 10*3/uL (ref 0.0–0.1)
Basophils Relative: 0 %
Eosinophils Absolute: 0 10*3/uL (ref 0.0–0.5)
Eosinophils Relative: 1 %
HCT: 30.6 % — ABNORMAL LOW (ref 36.0–46.0)
Hemoglobin: 9.8 g/dL — ABNORMAL LOW (ref 12.0–15.0)
Immature Granulocytes: 0 %
Lymphocytes Relative: 38 %
Lymphs Abs: 1.1 10*3/uL (ref 0.7–4.0)
MCH: 25.2 pg — ABNORMAL LOW (ref 26.0–34.0)
MCHC: 32 g/dL (ref 30.0–36.0)
MCV: 78.7 fL — ABNORMAL LOW (ref 80.0–100.0)
Monocytes Absolute: 0.5 10*3/uL (ref 0.1–1.0)
Monocytes Relative: 16 %
Neutro Abs: 1.3 10*3/uL — ABNORMAL LOW (ref 1.7–7.7)
Neutrophils Relative %: 45 %
Platelets: 221 10*3/uL (ref 150–400)
RBC: 3.89 MIL/uL (ref 3.87–5.11)
RDW: 14.9 % (ref 11.5–15.5)
WBC: 3 10*3/uL — ABNORMAL LOW (ref 4.0–10.5)
nRBC: 0 % (ref 0.0–0.2)

## 2021-03-24 LAB — C-REACTIVE PROTEIN: CRP: 0.6 mg/dL (ref <1.0)

## 2021-03-24 LAB — HEMOGLOBIN A1C
Hgb A1c MFr Bld: 9.3 % — ABNORMAL HIGH (ref 4.8–5.6)
Mean Plasma Glucose: 220 mg/dL

## 2021-03-24 LAB — FERRITIN: Ferritin: 57 ng/mL (ref 11–307)

## 2021-03-24 LAB — D-DIMER, QUANTITATIVE: D-Dimer, Quant: 3.01 ug/mL-FEU — ABNORMAL HIGH (ref 0.00–0.50)

## 2021-03-24 MED ORDER — POTASSIUM CHLORIDE 10 MEQ/100ML IV SOLN
10.0000 meq | INTRAVENOUS | Status: AC
  Administered 2021-03-24 (×2): 10 meq via INTRAVENOUS
  Filled 2021-03-24 (×6): qty 100

## 2021-03-24 MED ORDER — ASCORBIC ACID 500 MG PO TABS: 500.0000 mg | ORAL_TABLET | Freq: Every day | ORAL | 1 refills | Status: DC

## 2021-03-24 MED ORDER — GUAIFENESIN-DM 100-10 MG/5ML PO SYRP: 10.0000 mL | ORAL_SOLUTION | ORAL | 0 refills | Status: DC | PRN

## 2021-03-24 MED ORDER — ZINC SULFATE 220 (50 ZN) MG PO CAPS: 220.0000 mg | ORAL_CAPSULE | Freq: Every day | ORAL | 1 refills | Status: DC

## 2021-03-24 MED ORDER — CYANOCOBALAMIN 1000 MCG PO TABS: 1000.0000 ug | ORAL_TABLET | Freq: Every day | ORAL | 1 refills | Status: DC

## 2021-03-24 MED ORDER — SODIUM CHLORIDE 0.9 % IV SOLN: INTRAVENOUS | Status: DC | PRN

## 2021-03-24 NOTE — Progress Notes (Signed)
This RN contacted [name] at 1820 to notify her that patient was being transported to home via EMS at this time.

## 2021-03-24 NOTE — TOC Transition Note (Signed)
Transition of Care [facility]) - CM/SW Discharge Note   Patient Details  Name: Elizabeth Novak MRN: [id number] Date of Birth: 11-Aug-1923  Transition of Care [facility]) CM/SW Contact:  Alberteen Sam, LCSW Phone Number: 03/24/2021, 3:43 PM   Clinical Narrative:     Patient will DC to: home with home health Anticipated DC date: 03/24/21 Family notified: [name] Transport by: Johnanna Schneiders  Per MD patient ready for DC to home with [facility]. RN, patient, patient's family, and facility notified of DC. Ambulance transport requested for patient.  CSW signing off.  Pricilla Riffle, LCSW    Final next level of care: [place] Barriers to Discharge: No Barriers Identified   Patient Goals and CMS Choice Patient states their goals for this hospitalization and ongoing recovery are:: to go home CMS Medicare.gov Compare Post Acute Care list provided to:: Patient Represenative (must comment) [name]) Choice offered to / list presented to : [name] POA / Guardian  Discharge Placement                Patient to be transferred to facility by: [facility] Name of family member notified: [name] Patient and family notified of of transfer: 03/24/21  Discharge Plan and Services                          HH Arranged: PT, OT, RN, Nurse's Aide, Social Work [employer] Agency: [place] ([place]) Date [place]: 03/24/21 Time [place]: [phone] Representative spoke with at [place]: [place] ([place]) Interventions     Readmission Risk Interventions No flowsheet data found.

## 2021-03-24 NOTE — Progress Notes (Addendum)
Transition of Care [facility]) Screening Note   Patient Details  Name: Elizabeth Novak Date of Birth: July 10, 1923   Transition [facility] CM/SW Contact:    Gildardo Griffes, LCSW Phone Number: 03/24/2021, 10:06 AM  Patient is active with [facility] for RN services.  Transition of Care Department [facility]) has reviewed patient and no TOC needs have been identified at this time. We will continue to monitor patient advancement through interdisciplinary progression rounds. If new patient transition needs arise, please place a TOC consult.

## 2021-03-24 NOTE — Progress Notes (Addendum)
Patient aware of discharge plan. This RN spoke with Windy Fast regarding discharge plan. Patient belongings at bedside to be transported home via EMS.   Patient's home medications brought to hospital retrieved from pharmacy by this RN and placed in red bag belonging to patient at bedside.   Reviewed DC paperwork with patient, all questions answered at this time. DC paperwork placed in red bag belonging to patient at bedside.   Vitals WDL at time of discharge.

## 2021-03-24 NOTE — Progress Notes (Signed)
Patient brought medications from home. With the patient's consent, the medications were sent to pharmacy,to be reviewed, in a sealed & labelled bag by this RN.

## 2021-03-24 NOTE — Progress Notes (Signed)
Inpatient Follow-up/Progress Note   Patient ID: Elizabeth Novak is a 87 y.o. female.  Overnight Events / Subjective Findings NAEON. Hgb stable. Stool remains brown. No signs of GIB. Eliquis still held. Tolerating po. Upper GI study negative for ulceration. Tertiary contractions noted. Overall feeling a little better today. No other acute gi complaints.  Review of Systems  Constitutional:  Positive for fatigue. Negative for activity change, appetite change, chills, diaphoresis, fever and unexpected weight change.  HENT:  Negative for trouble swallowing and voice change.   Respiratory:  Negative for shortness of breath and wheezing.   Cardiovascular:  Negative for chest pain, palpitations and leg swelling.  Gastrointestinal:  Negative for abdominal distention, abdominal pain, anal bleeding, blood in stool, constipation, diarrhea, nausea, rectal pain and vomiting.  Musculoskeletal:  Negative for arthralgias and myalgias.  Skin:  Negative for color change and pallor.  Neurological:  Positive for weakness. Negative for dizziness and syncope.  Psychiatric/Behavioral:  Negative for confusion.   All other systems reviewed and are negative.   Medications  Current Facility-Administered Medications:    0.9 %  sodium chloride infusion (Manually program via Guardrails IV Fluids), , Intravenous, Once, Mansy, Jan A, MD   0.9 %  sodium chloride infusion, , Intravenous, PRN, Lorella Nimrod, MD, Stopped at 03/24/21 1328   acetaminophen (TYLENOL) tablet 650 mg, 650 mg, Oral, Q6H PRN, Mansy, Jan A, MD   ascorbic acid (VITAMIN C) tablet 500 mg, 500 mg, Oral, Daily, Mansy, Jan A, MD, 500 mg at 03/24/21 1436   atorvastatin (LIPITOR) tablet 20 mg, 20 mg, Oral, QHS, Mansy, Jan A, MD, 20 mg at 03/23/21 2041   digoxin (LANOXIN) tablet 0.125 mg, 0.125 mg, Oral, QODAY, Mansy, Jan A, MD, 0.125 mg at 03/24/21 1436   guaiFENesin-dextromethorphan (ROBITUSSIN DM) 100-10 MG/5ML syrup 10 mL, 10 mL, Oral, Q4H PRN,  Mansy, Jan A, MD, 10 mL at 03/23/21 0902   insulin aspart (novoLOG) injection 0-9 Units, 0-9 Units, Subcutaneous, Q4H, Amin, Soundra Pilon, MD, 2 Units at 03/23/21 2042   isosorbide mononitrate (IMDUR) 24 hr tablet 30 mg, 30 mg, Oral, Daily, Mansy, Jan A, MD, 30 mg at 03/24/21 1436   magnesium hydroxide (MILK OF MAGNESIA) suspension 30 mL, 30 mL, Oral, Daily PRN, Mansy, Jan A, MD   metoprolol succinate (TOPROL-XL) 24 hr tablet 50 mg, 50 mg, Oral, Daily, Mansy, Jan A, MD, 50 mg at 03/24/21 1436   mirtazapine (REMERON) tablet 7.5 mg, 7.5 mg, Oral, QHS, Mansy, Jan A, MD, 7.5 mg at 03/23/21 2041   multivitamin-lutein (OCUVITE-LUTEIN) capsule, , Oral, BID, Mansy, Jan A, MD, 1 capsule at 03/24/21 1436   nitroGLYCERIN (NITROSTAT) SL tablet 0.4 mg, 0.4 mg, Sublingual, Q5 min PRN, Mansy, Jan A, MD   ondansetron (ZOFRAN) tablet 4 mg, 4 mg, Oral, Q6H PRN **OR** ondansetron (ZOFRAN) injection 4 mg, 4 mg, Intravenous, Q6H PRN, Mansy, Jan A, MD   pantoprazole (PROTONIX) injection 40 mg, 40 mg, Intravenous, Q12H, Annamaria Helling, DO, 40 mg at 03/23/21 2043   pioglitazone (ACTOS) tablet 15 mg, 15 mg, Oral, Daily, Mansy, Jan A, MD, 15 mg at 03/24/21 1436   polyvinyl alcohol (LIQUIFILM TEARS) 1.4 % ophthalmic solution 1 drop, 1 drop, Both Eyes, TID PRN, Mansy, Jan A, MD   potassium chloride 10 mEq in 100 mL IVPB, 10 mEq, Intravenous, Q1 Hr x 6, Amin, Sumayya, MD, Stopped at 03/24/21 1328   [COMPLETED] remdesivir 200 mg in sodium chloride 0.9% 250 mL IVPB, 200 mg, Intravenous, Once, Stopped at 03/22/21  0203 **FOLLOWED BY** remdesivir 100 mg in sodium chloride 0.9 % 100 mL IVPB, 100 mg, Intravenous, Daily, Renda Rolls, RPH, Stopped at 03/24/21 2979   sucralfate (CARAFATE) tablet 1 g, 1 g, Oral, QID, Mansy, Jan A, MD, 1 g at 03/24/21 1436   torsemide (DEMADEX) tablet 20 mg, 20 mg, Oral, BID, Lorella Nimrod, MD, 20 mg at 03/23/21 2042   traZODone (DESYREL) tablet 25 mg, 25 mg, Oral, QHS PRN, Mansy, Jan A, MD   vitamin  B-12 (CYANOCOBALAMIN) tablet 1,000 mcg, 1,000 mcg, Oral, Daily, Lorella Nimrod, MD, 1,000 mcg at 03/24/21 1436   zinc sulfate capsule 220 mg, 220 mg, Oral, Daily, Mansy, Jan A, MD, 220 mg at 03/24/21 1436  sodium chloride Stopped (03/24/21 1328)   potassium chloride Stopped (03/24/21 1328)   remdesivir 100 mg in NS 100 mL Stopped (03/24/21 0921)    sodium chloride, acetaminophen, guaiFENesin-dextromethorphan, magnesium hydroxide, nitroGLYCERIN, ondansetron **OR** ondansetron (ZOFRAN) IV, polyvinyl alcohol, traZODone   Objective    Vitals:   03/24/21 0009 03/24/21 0359 03/24/21 0806 03/24/21 1435  BP: 127/65 (!) 154/73 (!) 144/89 122/80  Pulse: 76 80 82 97  Resp: 18 18 18 17   Temp: 97.7 F (36.5 C) (!) 97.4 F (36.3 C) 98 F (36.7 C) 98.1 F (36.7 C)  TempSrc:   Oral   SpO2: 96% 94% 96% 99%  Weight:      Height:         Physical Exam Vitals and nursing note reviewed.  Constitutional:      General: She is not in acute distress.    Appearance: She is not ill-appearing, toxic-appearing or diaphoretic.     Comments:  Frail appearing   HENT:     Head: Normocephalic and atraumatic.     Ears:     Comments: HOH    Nose: Nose normal.     Mouth/Throat:     Mouth: Mucous membranes are moist.     Pharynx: Oropharynx is clear.  Eyes:     General: No scleral icterus.    Extraocular Movements: Extraocular movements intact.  Cardiovascular:     Rate and Rhythm: Normal rate and regular rhythm.     Heart sounds: Normal heart sounds. No murmur heard.   No friction rub. No gallop.  Pulmonary:     Effort: Pulmonary effort is normal. No respiratory distress.     Breath sounds: Normal breath sounds. No wheezing, rhonchi or rales.     Comments: Diminished breath sounds bilaterally Abdominal:     General: Abdomen is flat. Bowel sounds are normal. There is no distension.     Palpations: Abdomen is soft.     Tenderness: There is no abdominal tenderness. There is no guarding or rebound.   Musculoskeletal:     Cervical back: Neck supple.     Right lower leg: Edema present.     Left lower leg: Edema present.  Skin:    General: Skin is warm and dry.     Coloration: Skin is not jaundiced or pale.  Neurological:     General: No focal deficit present.     Mental Status: She is alert and oriented to person, place, and time. Mental status is at baseline.  Psychiatric:        Mood and Affect: Mood normal.        Behavior: Behavior normal.        Thought Content: Thought content normal.        Judgment: Judgment normal.   Laboratory  Data Recent Labs  Lab 03/21/21 2027 03/22/21 0338 03/22/21 2323 03/23/21 0731 03/24/21 0523  WBC 5.1  --   --  3.0* 3.0*  HGB 8.6*   < > 9.0* 8.9* 9.8*  HCT 27.8*   < > 28.3* 28.1* 30.6*  PLT 229  --   --  212 221  NEUTOPHILPCT  --   --   --  48 45  LYMPHOPCT  --   --   --  30 38  MONOPCT  --   --   --  21 16  EOSPCT  --   --   --  1 1   < > = values in this interval not displayed.    Recent Labs  Lab 03/21/21 2027 03/23/21 0731 03/24/21 0523  NA 135 141 140  K 3.7 2.7* 3.1*  CL 101 105 102  CO2 30 30 32  BUN 21 13 15   CREATININE 0.79 0.68 0.68  CALCIUM 9.0 8.4* 8.6*  PROT 6.0* 5.1* 5.4*  BILITOT 0.8 0.6 0.9  ALKPHOS 54 45 54  ALT 17 16 17   AST 20 19 23   GLUCOSE 293* 86 100*    Recent Labs  Lab 03/21/21 2027  INR 1.1       Imaging Studies: DG UGI W SINGLE CM (SOL OR THIN BA)  Result Date: 03/24/2021 CLINICAL DATA:  History of melanoma. EXAM: UPPER GI SERIES WITHOUT KUB TECHNIQUE: Routine upper GI series was performed with thin/high density/water soluble barium. FLUOROSCOPY TIME:  Fluoroscopy Time:  2.9 minute Radiation Exposure Index (if provided by the fluoroscopic device): 16 mGy Number of Acquired Spot Images: 0 COMPARISON:  None. FINDINGS: UPPER GI SERIES: Limited evaluation secondary to difficulty in patient positioning and under distension of the stomach. Tertiary contractions of the esophagus as can be seen  with esophageal spasm versus presbyesophagus. Normal esophageal morphology without evidence of esophagitis or ulceration. No esophageal stricture, diverticula, or mass lesion. No evidence of hiatal hernia. No spontaneous or inducible gastroesophageal reflux. Thickening of the rugal folds which may be secondary to underdistention versus mild gastritis. Gastric mucosa appeared unremarkable without evidence of ulceration, scarring, or mass lesion. Gastric motility and emptying was normal. Fluoroscopic examination of the duodenum demonstrates normal motility and morphology without evidence of ulceration or mass lesion. IMPRESSION: 1. No evidence of a gastric ulcer or mass lesion. Thickening of the rugal folds as can be seen with mild gastritis versus under distension. 2. Tertiary contractions of the esophagus as can be seen with esophageal spasm versus presbyesophagus. Electronically Signed   By: Kathreen Devoid M.D.   On: 03/24/2021 12:51    Assessment:   # Acute on chronic anemia; iron deficient - Hgb on presentation was 8.6. now 7.9; was 11.6 two months ago             - MCV 84  - improved appropriately to 9.0 after 1u prbc transfusion  - now 9.8 as of 12/19 w/o further intervention - has had some lower extremity sewlling post transfusion - bun/cr 15/0.7- at baseline - nursing has noted large brown stool- no melena; stool brown again today (12/18) - ferritin 13; inr 13 - EGD/Colonoscopy 01/2013. Colon Polyps- TA, gastric polyps   # COVID-19 pna - receiving remdesivir   # afib on eliquis - reports it has been held for several days   # SSS s/p ppm   # DM2   # CVA  Plan:  Upper GI study reviewed and negative Tolerating diet;  Transition to  protonix 40 mg po once daily Eliquis being held; defer to shared decision making btwn patient, caretaker and primary/hospital provider to weigh bleeding vs stroke/pe risk in terms of reinitiating therapy Monitor H&H; transfusion and resuscitation as per  primary team;  Avoid frequent lab draws to prevent lab induced anemia Avoid nsaid Spoke with patient's nephew on 12/19 and informed of UGI study results  Given age and presentation with dyspnea and cough with covid 19 + pna, d/w patient and nephew who all agreed to conservative management at this time.   GI to sign off. Available as needed. Please do not hesitate to call regarding questions or concerns.  I personally performed the service.  Management of other medical comorbidities as per primary team  Thank you for allowing [place] to participate in this patient's care.   Annamaria Helling, DO [facility]  Portions of the record may have been created with voice recognition software. Occasional wrong-word or 'sound-a-like' substitutions may have occurred due to the inherent limitations of voice recognition software.  Read the chart carefully and recognize, using context, where substitutions may have occurred.

## 2021-03-24 NOTE — Discharge Summary (Signed)
Physician Discharge Summary  Elizabeth Novak WUJ:[id number] DOB: May 20, 1923 DOA: 03/22/2021  PCP: Danella Penton, MD  Admit date: 03/22/2021 Discharge date: 03/24/2021  Admitted From: Home Disposition: Home  Recommendations for Outpatient Follow-up:  Follow up with PCP in 1-2 weeks Please obtain BMP/CBC in one week Please follow up on the following pending results: None  Home Health: Yes Equipment/Devices: Rolling walker Discharge Condition: Stable CODE STATUS: Full Diet recommendation: Heart Healthy / Carb Modified   Brief/Interim Summary: Elizabeth Novak is a 87 y.o. female with medical history significant for type 2 diabetes mellitus, CHF, diverticulitis, GERD, dyslipidemia and atrial fibrillation on Eliquis, who presented to the emergency room with complaint of worsening cough and mild dyspnea.  Patient was complaining of melena for the past few weeks.  Apparently her PCP held Eliquis for few days.  Not sure when she really held the Eliquis.  Unable to obtain any history about NSAID. Hemoglobin was at 8.6 on admission, it was 11.6 in October 2022. Patient was having brown-colored bowel movements while in the hospital. She received 1 unit of PRBC with improvement in hemoglobin to 8.9>> 9.8.  She also received 1 unit of Feraheme due to some iron deficiency. Gastroenterology was consulted and they recommend conservative management with upper GI series imaging with nuclear medicine.  She underwent upper GI series on 03/24/2021 which was negative for any ulcers or obvious bleeding.  Hemoglobin improving and stable. We discontinue Eliquis on discharge and she needs to discuss with her primary care provider. Patient was on Eliquis due to A. fib.  She will continue home dose of digoxin and Toprol, rate remained well controlled.  High risk for GI bleeding and falls. She need to follow-up with her cardiologist for further recommendations.  She was also found to have COVID-19 infection.   Pretty much asymptomatic except mild cough.  Remained on room air.  She received 3 days of remdesivir.  She can continue with supportive care.  Patient has lower extremity edema.  No recent echo in the chart.  Lung seems clear.  BNP at 383.  She can continue home dose of torsemide and follow-up with her providers.  She can continue rest of her home medications except mentioned above and follow-up with her primary care provider.  Discharge Diagnoses:  Principal Problem:   GI bleeding Active Problems:   COVID-19   Discharge Instructions  Discharge Instructions     Diet - low sodium heart healthy   Complete by: As directed    Discharge instructions   Complete by: As directed    It was pleasure taking care of you. We stopped your Eliquis, please discuss with your doctor. You can continue using your supplements. You can use cough syrup if needed. Please follow up with your doctor.   Increase activity slowly   Complete by: As directed       Allergies as of 03/24/2021       Reactions   Codeine Nausea Only   Disopyramide    Other reaction(s): Unknown   Ibuprofen Diarrhea   Iodine    blisters   Metformin And Related Other (See Comments)   unknown   Norpace [disopyramide Phosphate]    Quinidine    Other reaction(s): Unknown   Terfenadine    Other reaction(s): Unknown   Topiramate    Other reaction(s): Other (See Comments) Hair loss   Verapamil    Other reaction(s): Unknown   Dexamethasone Sodium Phosphate Palpitations  Medication List     STOP taking these medications    amLODipine 5 MG tablet Commonly known as: NORVASC   apixaban 2.5 MG Tabs tablet Commonly known as: Eliquis   metolazone 2.5 MG tablet Commonly known as: ZAROXOLYN       TAKE these medications    ascorbic acid 500 MG tablet Commonly known as: VITAMIN C Take 1 tablet (500 mg total) by mouth daily. Start taking on: March 25, 2021   atorvastatin 20 MG tablet Commonly known  as: LIPITOR Take 1 tablet (20 mg total) by mouth at bedtime.   carboxymethylcellulose 0.5 % Soln Commonly known as: REFRESH PLUS 1 drop 3 (three) times daily as needed.   cyanocobalamin 1000 MCG tablet Take 1 tablet (1,000 mcg total) by mouth daily. Start taking on: March 25, 2021   digoxin 0.125 MG tablet Commonly known as: LANOXIN Take 1 tablet (0.125 mg total) by mouth every other day. CHF- * Hold for HR < 60   famotidine 20 MG tablet Commonly known as: PEPCID Take 20 mg by mouth at bedtime.   glipiZIDE 10 MG 24 hr tablet Commonly known as: GLUCOTROL XL Take 10 mg by mouth daily with breakfast.   guaiFENesin-dextromethorphan 100-10 MG/5ML syrup Commonly known as: ROBITUSSIN DM Take 10 mLs by mouth every 4 (four) hours as needed for cough.   isosorbide mononitrate 30 MG 24 hr tablet Commonly known as: IMDUR Take 30 mg by mouth daily.   metoprolol succinate 50 MG 24 hr tablet Commonly known as: TOPROL-XL Take 1 tablet (50 mg total) by mouth daily.   mirtazapine 15 MG tablet Commonly known as: REMERON Take 7.5 mg by mouth at bedtime.   nitroGLYCERIN 0.4 MG SL tablet Commonly known as: NITROSTAT Place 0.4 mg under the tongue every 5 (five) minutes as needed for chest pain.   omeprazole 20 MG capsule Commonly known as: PRILOSEC Take 20 mg by mouth 2 (two) times daily.   pioglitazone 15 MG tablet Commonly known as: ACTOS Take 15 mg by mouth daily.   potassium chloride 10 MEQ tablet Commonly known as: KLOR-CON M Take 40 mEq by mouth daily.   PRESERVISION AREDS 2 PO Take 1 tablet by mouth 2 (two) times daily.   sucralfate 1 g tablet Commonly known as: CARAFATE Take 1 g by mouth 4 (four) times daily.   torsemide 20 MG tablet Commonly known as: DEMADEX Take 20 mg by mouth 2 (two) times daily.   zinc sulfate 220 (50 Zn) MG capsule Take 1 capsule (220 mg total) by mouth daily. Start taking on: March 25, 2021        Follow-up Information      Danella Penton, MD. Schedule an appointment as soon as possible for a visit in 1 week(s).   Specialty: Internal Medicine Contact information: [phone] [facility] [address] [facility] [place] [address] [phone]                Allergies  Allergen Reactions   Codeine Nausea Only   Disopyramide     Other reaction(s): Unknown   Ibuprofen Diarrhea   Iodine     blisters   Metformin And Related Other (See Comments)    unknown   Norpace [Disopyramide Phosphate]    Quinidine     Other reaction(s): Unknown   Terfenadine     Other reaction(s): Unknown   Topiramate     Other reaction(s): Other (See Comments) Hair loss   Verapamil     Other  reaction(s): Unknown   Dexamethasone Sodium Phosphate Palpitations    Consultations: Gastroenterology  Procedures/Studies: DG Chest Portable 1 View  Result Date: 03/22/2021 CLINICAL DATA:  COVID-19 positivity with fevers, initial encounter EXAM: PORTABLE CHEST 1 VIEW COMPARISON:  08/29/2019 FINDINGS: Cardiac shadow is enlarged but stable. Pacing device is again seen. The lungs are clear bilaterally. No infiltrate is noted. Multiple skin folds are noted over the right chest. No bony abnormality is seen. IMPRESSION: No acute abnormality noted Electronically Signed   By: Alcide Clever M.D.   On: 03/22/2021 01:26   DG UGI W SINGLE CM (SOL OR THIN BA)  Result Date: 03/24/2021 CLINICAL DATA:  History of melanoma. EXAM: UPPER GI SERIES WITHOUT KUB TECHNIQUE: Routine upper GI series was performed with thin/high density/water soluble barium. FLUOROSCOPY TIME:  Fluoroscopy Time:  2.9 minute Radiation Exposure Index (if provided by the fluoroscopic device): 16 mGy Number of Acquired Spot Images: 0 COMPARISON:  None. FINDINGS: UPPER GI SERIES: Limited evaluation secondary to difficulty in patient positioning and under distension of the stomach. Tertiary contractions of the esophagus as can be seen with esophageal spasm versus  presbyesophagus. Normal esophageal morphology without evidence of esophagitis or ulceration. No esophageal stricture, diverticula, or mass lesion. No evidence of hiatal hernia. No spontaneous or inducible gastroesophageal reflux. Thickening of the rugal folds which may be secondary to underdistention versus mild gastritis. Gastric mucosa appeared unremarkable without evidence of ulceration, scarring, or mass lesion. Gastric motility and emptying was normal. Fluoroscopic examination of the duodenum demonstrates normal motility and morphology without evidence of ulceration or mass lesion. IMPRESSION: 1. No evidence of a gastric ulcer or mass lesion. Thickening of the rugal folds as can be seen with mild gastritis versus under distension. 2. Tertiary contractions of the esophagus as can be seen with esophageal spasm versus presbyesophagus. Electronically Signed   By: Elige Ko M.D.   On: 03/24/2021 12:51    Subjective: Patient was seen and examined.No new complaints.  Patient is still thinking that she is having black color stools, per nursing staff stools are brown in color.  She is very hard of hearing.  When I confronted she said that I know my colors. Waiting for upper GI series and asking for food.  Discharge Exam: Vitals:   03/24/21 0806 03/24/21 1435  BP: (!) 144/89 122/80  Pulse: 82 97  Resp: 18 17  Temp: 98 F (36.7 C) 98.1 F (36.7 C)  SpO2: 96% 99%   Vitals:   03/24/21 0009 03/24/21 0359 03/24/21 0806 03/24/21 1435  BP: 127/65 (!) 154/73 (!) 144/89 122/80  Pulse: 76 80 82 97  Resp: 18 18 18 17   Temp: 97.7 F (36.5 C) (!) 97.4 F (36.3 C) 98 F (36.7 C) 98.1 F (36.7 C)  TempSrc:   Oral   SpO2: 96% 94% 96% 99%  Weight:      Height:        General: Pt is alert, awake, not in acute distress Cardiovascular: RRR, S1/S2 +, no rubs, no gallops Respiratory: CTA bilaterally, no wheezing, no rhonchi Abdominal: Soft, NT, ND, bowel sounds + Extremities: no edema, no  cyanosis   The results of significant diagnostics from this hospitalization (including imaging, microbiology, ancillary and laboratory) are listed below for reference.    Microbiology: Recent Results (from the past 240 hour(s))  Resp Panel by RT-PCR (Flu A&B, Covid) Nasopharyngeal Swab     Status: Abnormal   Collection Time: 03/21/21  8:27 PM   Specimen: Nasopharyngeal Swab; Nasopharyngeal(NP) swabs  in vial transport medium  Result Value Ref Range Status   SARS Coronavirus 2 by RT PCR POSITIVE (A) NEGATIVE Final    Comment: (NOTE) SARS-CoV-2 target nucleic acids are DETECTED.  The SARS-CoV-2 RNA is generally detectable in upper respiratory specimens during the acute phase of infection. Positive results are indicative of the presence of the identified virus, but do not rule out bacterial infection or co-infection with other pathogens not detected by the test. Clinical correlation with patient history and other diagnostic information is necessary to determine patient infection status. The expected result is Negative.  Fact Sheet for Patients: BloggerCourse.com  Fact Sheet for Healthcare Providers: SeriousBroker.it  This test is not yet approved or cleared by the [place] FDA and  has been authorized for detection and/or diagnosis of SARS-CoV-2 by FDA under an Emergency Use Authorization (EUA).  This EUA will remain in effect (meaning this test can be used) for the duration of  the COVID-19 declaration under Section 564(b)(1) of the A ct, 21 U.S.C. section 360bbb-3(b)(1), unless the authorization is terminated or revoked sooner.     Influenza A by PCR NEGATIVE NEGATIVE Final   Influenza B by PCR NEGATIVE NEGATIVE Final    Comment: (NOTE) The Xpert Xpress SARS-CoV-2/FLU/RSV plus assay is intended as an aid in the diagnosis of influenza from Nasopharyngeal swab specimens and should not be used as a sole basis for treatment.  Nasal washings and aspirates are unacceptable for Xpert Xpress SARS-CoV-2/FLU/RSV testing.  Fact Sheet for Patients: BloggerCourse.com  Fact Sheet for Healthcare Providers: SeriousBroker.it  This test is not yet approved or cleared by the [place] FDA and has been authorized for detection and/or diagnosis of SARS-CoV-2 by FDA under an Emergency Use Authorization (EUA). This EUA will remain in effect (meaning this test can be used) for the duration of the COVID-19 declaration under Section 564(b)(1) of the Act, 21 U.S.C. section 360bbb-3(b)(1), unless the authorization is terminated or revoked.  Performed at [facility], [address] [place], [place] [address]      Labs: BNP (last 3 results) Recent Labs    03/22/21 0338  BNP 383.7*   Basic Metabolic Panel: Recent Labs  Lab 03/21/21 2027 03/23/21 0731 03/23/21 0732 03/24/21 0523  NA 135 141  --  140  K 3.7 2.7*  --  3.1*  CL 101 105  --  102  CO2 30 30  --  32  GLUCOSE 293* 86  --  100*  BUN 21 13  --  15  CREATININE 0.79 0.68  --  0.68  CALCIUM 9.0 8.4*  --  8.6*  MG 2.1  --  1.9  --    Liver Function Tests: Recent Labs  Lab 03/21/21 2027 03/23/21 0731 03/24/21 0523  AST 20 19 23   ALT 17 16 17   ALKPHOS 54 45 54  BILITOT 0.8 0.6 0.9  PROT 6.0* 5.1* 5.4*  ALBUMIN 3.3* 2.8* 2.9*   No results for input(s): LIPASE, AMYLASE in the last 168 hours. No results for input(s): AMMONIA in the last 168 hours. CBC: Recent Labs  Lab 03/21/21 2027 03/22/21 0338 03/22/21 1741 03/22/21 2323 03/23/21 0731 03/24/21 0523  WBC 5.1  --   --   --  3.0* 3.0*  NEUTROABS  --   --   --   --  1.4* 1.3*  HGB 8.6* 7.9* 9.0* 9.0* 8.9* 9.8*  HCT 27.8* 25.4* 28.6* 28.3* 28.1* 30.6*  MCV 80.1  --   --   --  78.5* 78.7*  PLT 229  --   --   --  212 221   Cardiac Enzymes: No results for input(s): CKTOTAL, CKMB, CKMBINDEX, TROPONINI in the last 168  hours. BNP: Invalid input(s): POCBNP CBG: Recent Labs  Lab 03/24/21 0021 03/24/21 0054 03/24/21 0203 03/24/21 0401 03/24/21 0804  GLUCAP 65* 112* 120* 105* 109*   D-Dimer Recent Labs    03/23/21 0731 03/24/21 0523  DDIMER 2.84* 3.01*   Hgb A1c Recent Labs    03/22/21 1741  HGBA1C 9.3*   Lipid Profile No results for input(s): CHOL, HDL, LDLCALC, TRIG, CHOLHDL, LDLDIRECT in the last 72 hours. Thyroid function studies No results for input(s): TSH, T4TOTAL, T3FREE, THYROIDAB in the last 72 hours.  Invalid input(s): FREET3 Anemia work up Recent Labs    03/22/21 1741 03/23/21 0731 03/24/21 0523  VITAMINB12 240  --   --   FOLATE 22.1  --   --   FERRITIN  --  16 57  TIBC 340  --   --   IRON 19*  --   --   RETICCTPCT 1.5  --   --    Urinalysis    Component Value Date/Time   COLORURINE STRAW (A) 01/06/2021 2255   APPEARANCEUR CLEAR (A) 01/06/2021 2255   LABSPEC 1.020 01/06/2021 2255   PHURINE 6.0 01/06/2021 2255   GLUCOSEU >=500 (A) 01/06/2021 2255   HGBUR NEGATIVE 01/06/2021 2255   BILIRUBINUR NEGATIVE 01/06/2021 2255   KETONESUR NEGATIVE 01/06/2021 2255   PROTEINUR 30 (A) 01/06/2021 2255   NITRITE NEGATIVE 01/06/2021 2255   LEUKOCYTESUR NEGATIVE 01/06/2021 2255   Sepsis Labs Invalid input(s): PROCALCITONIN,  WBC,  LACTICIDVEN Microbiology Recent Results (from the past 240 hour(s))  Resp Panel by RT-PCR (Flu A&B, Covid) Nasopharyngeal Swab     Status: Abnormal   Collection Time: 03/21/21  8:27 PM   Specimen: Nasopharyngeal Swab; Nasopharyngeal(NP) swabs in vial transport medium  Result Value Ref Range Status   SARS Coronavirus 2 by RT PCR POSITIVE (A) NEGATIVE Final    Comment: (NOTE) SARS-CoV-2 target nucleic acids are DETECTED.  The SARS-CoV-2 RNA is generally detectable in upper respiratory specimens during the acute phase of infection. Positive results are indicative of the presence of the identified virus, but do not rule out bacterial infection  or co-infection with other pathogens not detected by the test. Clinical correlation with patient history and other diagnostic information is necessary to determine patient infection status. The expected result is Negative.  Fact Sheet for Patients: BloggerCourse.com  Fact Sheet for Healthcare Providers: SeriousBroker.it  This test is not yet approved or cleared by the [place] FDA and  has been authorized for detection and/or diagnosis of SARS-CoV-2 by FDA under an Emergency Use Authorization (EUA).  This EUA will remain in effect (meaning this test can be used) for the duration of  the COVID-19 declaration under Section 564(b)(1) of the A ct, 21 U.S.C. section 360bbb-3(b)(1), unless the authorization is terminated or revoked sooner.     Influenza A by PCR NEGATIVE NEGATIVE Final   Influenza B by PCR NEGATIVE NEGATIVE Final    Comment: (NOTE) The Xpert Xpress SARS-CoV-2/FLU/RSV plus assay is intended as an aid in the diagnosis of influenza from Nasopharyngeal swab specimens and should not be used as a sole basis for treatment. Nasal washings and aspirates are unacceptable for Xpert Xpress SARS-CoV-2/FLU/RSV testing.  Fact Sheet for Patients: BloggerCourse.com  Fact Sheet for Healthcare Providers: SeriousBroker.it  This test is not yet approved or cleared by  the [employer] and has been authorized for detection and/or diagnosis of SARS-CoV-2 by FDA under an Emergency Use Authorization (EUA). This EUA will remain in effect (meaning this test can be used) for the duration of the COVID-19 declaration under Section 564(b)(1) of the Act, 21 U.S.C. section 360bbb-3(b)(1), unless the authorization is terminated or revoked.  Performed at [facility], [address] [place], [place] [address]     Time coordinating discharge: Over 30  minutes  SIGNED:  Arnetha Courser, MD  [facility] 03/24/2021, 3:34 PM  If 7PM-7AM, please contact night-coverage [email or web address]  This record has been created using Conservation officer, historic buildings. Errors have been sought and corrected,but may not always be located. Such creation errors do not reflect on the standard of care.

## 2021-03-24 NOTE — Progress Notes (Addendum)
Pt supposed to be NPO after midnight, but blood sugar was 65, gave 1 cup of apple juice per charge [place]. MD Hal Hope made aware, no new orders at this time.

## 2021-04-04 DIAGNOSIS — Z7984 Long term (current) use of oral hypoglycemic drugs: Secondary | ICD-10-CM | POA: Diagnosis not present

## 2021-04-04 DIAGNOSIS — E1165 Type 2 diabetes mellitus with hyperglycemia: Secondary | ICD-10-CM | POA: Diagnosis not present

## 2021-04-04 DIAGNOSIS — Z7901 Long term (current) use of anticoagulants: Secondary | ICD-10-CM | POA: Diagnosis not present

## 2021-04-04 DIAGNOSIS — Z79899 Other long term (current) drug therapy: Secondary | ICD-10-CM | POA: Insufficient documentation

## 2021-04-04 DIAGNOSIS — I2510 Atherosclerotic heart disease of native coronary artery without angina pectoris: Secondary | ICD-10-CM | POA: Diagnosis not present

## 2021-04-04 DIAGNOSIS — R052 Subacute cough: Secondary | ICD-10-CM | POA: Insufficient documentation

## 2021-04-04 DIAGNOSIS — I110 Hypertensive heart disease with heart failure: Secondary | ICD-10-CM | POA: Diagnosis not present

## 2021-04-04 DIAGNOSIS — Z8616 Personal history of COVID-19: Secondary | ICD-10-CM | POA: Insufficient documentation

## 2021-04-04 DIAGNOSIS — Z853 Personal history of malignant neoplasm of breast: Secondary | ICD-10-CM | POA: Diagnosis not present

## 2021-04-04 DIAGNOSIS — E11618 Type 2 diabetes mellitus with other diabetic arthropathy: Secondary | ICD-10-CM | POA: Diagnosis not present

## 2021-04-04 DIAGNOSIS — I5032 Chronic diastolic (congestive) heart failure: Secondary | ICD-10-CM | POA: Insufficient documentation

## 2021-04-04 DIAGNOSIS — Z950 Presence of cardiac pacemaker: Secondary | ICD-10-CM | POA: Diagnosis not present

## 2021-04-04 LAB — CBC WITH DIFFERENTIAL/PLATELET
Abs Immature Granulocytes: 0.02 10*3/uL (ref 0.00–0.07)
Basophils Absolute: 0 10*3/uL (ref 0.0–0.1)
Basophils Relative: 0 %
Eosinophils Absolute: 0.1 10*3/uL (ref 0.0–0.5)
Eosinophils Relative: 1 %
HCT: 34.5 % — ABNORMAL LOW (ref 36.0–46.0)
Hemoglobin: 10.6 g/dL — ABNORMAL LOW (ref 12.0–15.0)
Immature Granulocytes: 0 %
Lymphocytes Relative: 11 %
Lymphs Abs: 0.8 10*3/uL (ref 0.7–4.0)
MCH: 25.4 pg — ABNORMAL LOW (ref 26.0–34.0)
MCHC: 30.7 g/dL (ref 30.0–36.0)
MCV: 82.5 fL (ref 80.0–100.0)
Monocytes Absolute: 0.8 10*3/uL (ref 0.1–1.0)
Monocytes Relative: 11 %
Neutro Abs: 5.8 10*3/uL (ref 1.7–7.7)
Neutrophils Relative %: 77 %
Platelets: 201 10*3/uL (ref 150–400)
RBC: 4.18 MIL/uL (ref 3.87–5.11)
RDW: 17 % — ABNORMAL HIGH (ref 11.5–15.5)
WBC: 7.5 10*3/uL (ref 4.0–10.5)
nRBC: 0 % (ref 0.0–0.2)

## 2021-04-04 LAB — COMPREHENSIVE METABOLIC PANEL
ALT: 25 U/L (ref 0–44)
AST: 40 U/L (ref 15–41)
Albumin: 3.1 g/dL — ABNORMAL LOW (ref 3.5–5.0)
Alkaline Phosphatase: 55 U/L (ref 38–126)
Anion gap: 7 (ref 5–15)
BUN: 15 mg/dL (ref 8–23)
CO2: 33 mmol/L — ABNORMAL HIGH (ref 22–32)
Calcium: 9.1 mg/dL (ref 8.9–10.3)
Chloride: 99 mmol/L (ref 98–111)
Creatinine, Ser: 0.82 mg/dL (ref 0.44–1.00)
GFR, Estimated: >60 mL/min (ref >60)
Glucose, Bld: 293 mg/dL — ABNORMAL HIGH (ref 70–99)
Potassium: 3.5 mmol/L (ref 3.5–5.1)
Sodium: 139 mmol/L (ref 135–145)
Total Bilirubin: 0.8 mg/dL (ref 0.3–1.2)
Total Protein: 5.9 g/dL — ABNORMAL LOW (ref 6.5–8.1)

## 2021-04-04 LAB — CBG MONITORING, ED: Glucose-Capillary: 291 mg/dL — ABNORMAL HIGH (ref 70–99)

## 2021-04-04 NOTE — ED Notes (Signed)
Pt via EMS from home recently discharged from hospital due to [place] admitted 12/16 dc 12/21, stays by herself at night  Lower leg weeping   EMS CBG 409 Bp 118/80 HR 61 RR16 98.49F Alert and oriented X4 [employer] Caregiver can pick pt up  [phone] HX: CHF, MI, CVA

## 2021-04-04 NOTE — ED Triage Notes (Signed)
Pt states her BG was 402 at home. Pt denies any symptoms other than being cold. Physical therapy was at her house and was the one who took her BG.

## 2021-04-05 ENCOUNTER — Emergency Department: Payer: Medicare Other

## 2021-04-05 ENCOUNTER — Emergency Department
Admission: EM | Admit: 2021-04-05 | Discharge: 2021-04-05 | Disposition: A | Discharge status: Home / Self Care | Payer: Medicare Other | Attending: Emergency Medicine | Admitting: Emergency Medicine

## 2021-04-05 ENCOUNTER — Encounter: Payer: Self-pay | Admitting: Radiology

## 2021-04-05 DIAGNOSIS — E1165 Type 2 diabetes mellitus with hyperglycemia: Secondary | ICD-10-CM | POA: Diagnosis not present

## 2021-04-05 DIAGNOSIS — R052 Subacute cough: Secondary | ICD-10-CM

## 2021-04-05 DIAGNOSIS — R739 Hyperglycemia, unspecified: Secondary | ICD-10-CM

## 2021-04-05 LAB — URINALYSIS, ROUTINE W REFLEX MICROSCOPIC
Bacteria, UA: NONE SEEN
Bilirubin Urine: NEGATIVE
Glucose, UA: >=500 mg/dL — AB
Hgb urine dipstick: NEGATIVE
Ketones, ur: NEGATIVE mg/dL
Leukocytes,Ua: NEGATIVE
Nitrite: NEGATIVE
Protein, ur: NEGATIVE mg/dL
Specific Gravity, Urine: 1.01 (ref 1.005–1.030)
pH: 6 (ref 5.0–8.0)

## 2021-04-05 LAB — CBG MONITORING, ED: Glucose-Capillary: 180 mg/dL — ABNORMAL HIGH (ref 70–99)
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
## 2021-04-05 NOTE — ED Notes (Signed)
Pt alert, pt states she is her because her bg is elevated, pt with dry cough

## 2021-04-08 LAB — BPAM RBC
Blood Product Expiration Date: 202301022359
Blood Product Expiration Date: 202301032359
Blood Product Expiration Date: 202301102359
ISSUE DATE / TIME: 202212171227
ISSUE DATE / TIME: 202212292049
ISSUE DATE / TIME: 202212292333
Unit Type and Rh: 5100
Unit Type and Rh: 600
Unit Type and Rh: 6200

## 2021-04-08 LAB — TYPE AND SCREEN
ABO/RH(D): A POS
Antibody Screen: POSITIVE
DAT, IgG: POSITIVE
DAT, complement: POSITIVE
Donor AG Type: NEGATIVE
Unit division: 0
Unit division: 0
Unit division: 0

## 2021-04-11 ENCOUNTER — Other Ambulatory Visit: Payer: Self-pay

## 2021-04-11 ENCOUNTER — Encounter: Payer: Self-pay | Admitting: Emergency Medicine

## 2021-04-11 DIAGNOSIS — E871 Hypo-osmolality and hyponatremia: Secondary | ICD-10-CM | POA: Diagnosis not present

## 2021-04-11 DIAGNOSIS — M79602 Pain in left arm: Secondary | ICD-10-CM | POA: Diagnosis not present

## 2021-04-11 DIAGNOSIS — E876 Hypokalemia: Secondary | ICD-10-CM | POA: Diagnosis not present

## 2021-04-11 LAB — TROPONIN I (HIGH SENSITIVITY): Troponin I (High Sensitivity): 11 ng/L (ref <18)

## 2021-04-11 NOTE — ED Notes (Signed)
Pt to ED via [facility] from home for left arm pain. Pt CBG 383. Pt is in NAD.

## 2021-04-11 NOTE — ED Notes (Signed)
Pt assisted to restroom by this RN

## 2021-04-11 NOTE — ED Triage Notes (Signed)
Pt BIB EMS with c/o left arm pain that started x 1 hour after pulling her pants up. Pt has hx of stroke and MI

## 2021-04-12 ENCOUNTER — Emergency Department
Admission: EM | Admit: 2021-04-12 | Discharge: 2021-04-12 | Disposition: A | Discharge status: Home / Self Care | Payer: Medicare Other | Attending: Emergency Medicine | Admitting: Emergency Medicine

## 2021-04-12 DIAGNOSIS — M79602 Pain in left arm: Secondary | ICD-10-CM

## 2021-04-12 LAB — CBC WITH DIFFERENTIAL/PLATELET
Abs Immature Granulocytes: 0.01 10*3/uL (ref 0.00–0.07)
Basophils Absolute: 0 10*3/uL (ref 0.0–0.1)
Basophils Relative: 1 %
Eosinophils Absolute: 0.1 10*3/uL (ref 0.0–0.5)
Eosinophils Relative: 1 %
HCT: 37.2 % (ref 36.0–46.0)
Hemoglobin: 11.4 g/dL — ABNORMAL LOW (ref 12.0–15.0)
Immature Granulocytes: 0 %
Lymphocytes Relative: 18 %
Lymphs Abs: 0.8 10*3/uL (ref 0.7–4.0)
MCH: 25.7 pg — ABNORMAL LOW (ref 26.0–34.0)
MCHC: 30.6 g/dL (ref 30.0–36.0)
MCV: 83.8 fL (ref 80.0–100.0)
Monocytes Absolute: 0.4 10*3/uL (ref 0.1–1.0)
Monocytes Relative: 10 %
Neutro Abs: 3 10*3/uL (ref 1.7–7.7)
Neutrophils Relative %: 70 %
Platelets: 342 10*3/uL (ref 150–400)
RBC: 4.44 MIL/uL (ref 3.87–5.11)
RDW: 17.2 % — ABNORMAL HIGH (ref 11.5–15.5)
WBC: 4.4 10*3/uL (ref 4.0–10.5)
nRBC: 0 % (ref 0.0–0.2)

## 2021-04-12 LAB — BASIC METABOLIC PANEL
Anion gap: 8 (ref 5–15)
BUN: 23 mg/dL (ref 8–23)
CO2: 32 mmol/L (ref 22–32)
Calcium: 9.2 mg/dL (ref 8.9–10.3)
Chloride: 93 mmol/L — ABNORMAL LOW (ref 98–111)
Creatinine, Ser: 1.12 mg/dL — ABNORMAL HIGH (ref 0.44–1.00)
GFR, Estimated: 45 mL/min — ABNORMAL LOW (ref >60)
Glucose, Bld: 368 mg/dL — ABNORMAL HIGH (ref 70–99)
Potassium: 3.2 mmol/L — ABNORMAL LOW (ref 3.5–5.1)
Sodium: 133 mmol/L — ABNORMAL LOW (ref 135–145)

## 2021-04-12 LAB — TROPONIN I (HIGH SENSITIVITY): Troponin I (High Sensitivity): 12 ng/L (ref <18)

## 2021-04-12 NOTE — Discharge Instructions (Signed)
Your workup in the Emergency Department today was reassuring.  We did not find any specific abnormalities.  We recommend you drink plenty of fluids, take your regular medications and/or any new ones prescribed today, and follow up with the doctor(s) listed in these documents as recommended.  Return to the Emergency Department if you develop new or worsening symptoms that concern you.  

## 2021-04-12 NOTE — ED Provider Notes (Signed)
[facility] Provider Note    Event Date/Time   First MD Initiated Contact with Patient 04/12/21 0448     (approximate)   History   Arm Pain   Level 5 caveat: History is limited by the patient being a vague historian.   HPI  Elizabeth Novak is a 88 y.o. female with extensive chronic medical history that includes but is not limited to prior MI/CAD, prior stroke, anxiety, chronic atrial fibrillation, prior episodes of electrolyte abnormalities, prior GI bleed.  She also reports that she lives alone.  She called EMS tonight because she felt like she was in her normal state of health until she went to the bathroom.  While she was pulling up her pants she had a cute onset severe pain in her left arm.  She is unable to describe exactly where it was hurting but it seems to be in the upper arm.  She has been waiting for an extended period of time in the emergency department and during that time the arm pain has resolved.  She no longer can feel it.  Based on her report it sounds as if it was hurting worse when she moves her arm but now it does not hurt even if she moves her arm around.  She reports that she did not fall, simply had a cute onset of pain when she pulled up her pants.  She reports that occasionally she has chest pain but that is not unique to tonight and she frequently has intermittent chest pains.  She has not had any shortness of breath tonight.  She denies fever.  She has not had any nausea, vomiting, nor abdominal pain.  She has no numbness nor tingling in her left arm in spite of the pain.     Physical Exam   Triage Vital Signs: ED Triage Vitals  Enc Vitals Group     BP 04/11/21 1956 (!) 161/80     Pulse Rate 04/11/21 1956 65     Resp 04/11/21 1956 18     Temp 04/11/21 1956 97.8 F (36.6 C)     Temp src --      SpO2 04/11/21 1956 99 %     Weight 04/11/21 1959 40.8 kg (89 lb 15.2 oz)     Height 04/11/21 1959 1.626 m (5\' 4" )     Head  Circumference --      Peak Flow --      Pain Score 04/11/21 1958 10     Pain Loc --      Pain Edu? --      Excl. in [place]? --     Most recent vital signs: Vitals:   04/12/21 0211 04/12/21 0500  BP: 123/78 126/63  Pulse: 75 64  Resp: 17 16  Temp:    SpO2: 95% 98%     General: Awake, no distress.  Elderly but oriented and able to answer most of my questions although the history is somewhat vague. CV:  Good peripheral perfusion.  Easily palpable radial pulse in the left arm.  No swelling, cyanosis or other discoloration, or other visible abnormalities that would suggest vascular issue. Resp:  Normal effort.  Abd:  No distention.  No tenderness to palpation. MSK:  Patient has no visible abnormalities in any of her extremities including her left upper extremity.  I was able to passively and fully range her shoulder, elbow, and wrist, with no reproducible pain or tenderness.  She is able to move  her extremity without difficulty.   ED Results / Procedures / Treatments   Labs (all labs ordered are listed, but only abnormal results are displayed) Labs Reviewed  CBC WITH DIFFERENTIAL/PLATELET - Abnormal; Notable for the following components:      Result Value   Hemoglobin 11.4 (*)    MCH 25.7 (*)    RDW 17.2 (*)    All other components within normal limits  BASIC METABOLIC PANEL - Abnormal; Notable for the following components:   Sodium 133 (*)    Potassium 3.2 (*)    Chloride 93 (*)    Glucose, Bld 368 (*)    Creatinine, Ser 1.12 (*)    GFR, Estimated 45 (*)    All other components within normal limits  URINALYSIS, ROUTINE W REFLEX MICROSCOPIC  CBG MONITORING, ED  TROPONIN I (HIGH SENSITIVITY)  TROPONIN I (HIGH SENSITIVITY)     EKG  ED ECG REPORT I, Hinda Kehr, the attending physician, personally viewed and interpreted this ECG.  Date: 04/11/2021 EKG Time: 20: 05 Rate: 69 Rhythm: A flutter QRS Axis: normal Intervals: Abnormal given the a flutter ST/T Wave  abnormalities: Non-specific ST segment / T-wave changes, but no clear evidence of acute ischemia. Narrative Interpretation: no definitive evidence of acute ischemia; does not meet STEMI criteria.  No significant change from prior EKGs.   Marland Kitchen1-3 Lead EKG Interpretation Performed by: Hinda Kehr, MD Authorized by: Hinda Kehr, MD   .1-3 Lead EKG Interpretation Performed by: Hinda Kehr, MD Authorized by: Hinda Kehr, MD   .1-3 Lead EKG Interpretation Performed by: Hinda Kehr, MD Authorized by: Hinda Kehr, MD      IMPRESSION / MDM / [place] / ED COURSE  I reviewed the triage vital signs and the nursing notes.                              Differential diagnosis includes, but is not limited to, musculoskeletal pain, fracture/dislocation, vascular injury, atypical ACS presentation.  In spite of the patient's advanced age, she is in no apparent distress at this time and is alert, oriented, and able to answer my questions although her history is vague at times.  She admits to occasional chest pain but does not stated that she had any pain tonight at the onset of her symptoms.  She is reporting left arm pain that has resolved.  On exam there is no indication of bony injury such as a fracture or dislocation.  She has easily palpable radial pulse and no evidence of any vascular injury.  Basic metabolic panel, CBC, and high-sensitivity troponin were all ordered as part of a work-up in addition to an EKG.  Her EKG shows no obvious sign of ischemia and although there are some abnormalities such as ST depression most notable in the lateral leads, there is no significant change compared to prior EKGs.  I personally reviewed her basic metabolic panel and other than very mild electrolyte abnormalities such as mild hyponatremia and hypokalemia, her labs are otherwise reassuring, and her labs are consistent with prior.  Her CBC is essentially normal with no leukocytosis and a  hemoglobin of 11.4.  Her high-sensitivity troponin is 11 which is very much within normal limits.  The patient is on the cardiac monitor to evaluate for evidence of arrhythmia and/or significant heart rate changes.  No indication for x-rays or other imaging of her arm.  I believe this was most likely muscular and  has resolved.  Given her age and prior medical history such as hypertension and prior ACS, which increases her risk of having an atypical ACS as the patient, I will obtain a second high-sensitivity troponin and observe her for a while longer in the emergency department.  However at this point I believe she likely will be appropriate for discharge and outpatient follow-up assuming no significant clinical changes or elevated second high-sensitivity troponin.   Clinical Course as of 04/12/21 0723  Sat Apr 12, 2021  0716 Troponin I (High Sensitivity): 12 Repeat troponin is again within normal limits.  Patient is not reporting any pain in her arm at this time.  She says she has a little bit of numbness in her hand but also says that that happens sometimes.  There is no indication that she has an acute or emergent medical condition.  She has been in the emergency department for nearly 12 hours and has been stable throughout that time.  I strongly considered admission given the patient's age and that she reports that she lives alone, but for better for worse, these are not criteria for inpatient treatment, and she is not reporting chest pain or shortness of breath that may justify a hospital observation stay.  Her physical exam is reassuring as is her laboratory work-up and she has no significant changes on her EKG compared to prior.  At this point, in spite of her advanced age, there is no indication that she has an acute or emergent medical condition and she should be appropriate for discharge and outpatient follow-up.  When I discussed all of this with the patient she says she is comfortable with the  plan to go home and will follow up with her regular doctor.  I gave her strict return precautions. [CF]    Clinical Course User Index [CF] Hinda Kehr, MD     FINAL CLINICAL IMPRESSION(S) / ED DIAGNOSES   Final diagnoses:  Left arm pain     Rx / DC Orders   ED Discharge Orders     None        Note:  This document was prepared using Dragon voice recognition software and may include unintentional dictation errors.   Hinda Kehr, MD 04/12/21 [phone]

## 2021-04-12 NOTE — ED Notes (Signed)
Pt ambulatory with assist the the bedside toilet. Pt informed that she would be discharged shortly and stated that she would be able to call for a ride.

## 2021-04-25 ENCOUNTER — Other Ambulatory Visit: Payer: Self-pay

## 2021-04-25 ENCOUNTER — Emergency Department: Payer: Medicare Other

## 2021-04-25 ENCOUNTER — Inpatient Hospital Stay
Admission: EM | Admit: 2021-04-25 | Discharge: 2021-04-29 | DRG: 637 | Disposition: A | Discharge status: Skilled Nursing Facility | Payer: Medicare Other | Attending: Student in an Organized Health Care Education/Training Program | Admitting: Student in an Organized Health Care Education/Training Program

## 2021-04-25 DIAGNOSIS — Z79899 Other long term (current) drug therapy: Secondary | ICD-10-CM

## 2021-04-25 DIAGNOSIS — W19XXXA Unspecified fall, initial encounter: Secondary | ICD-10-CM | POA: Diagnosis present

## 2021-04-25 DIAGNOSIS — G9341 Metabolic encephalopathy: Secondary | ICD-10-CM | POA: Diagnosis not present

## 2021-04-25 DIAGNOSIS — E876 Hypokalemia: Secondary | ICD-10-CM | POA: Diagnosis present

## 2021-04-25 DIAGNOSIS — E11649 Type 2 diabetes mellitus with hypoglycemia without coma: Principal | ICD-10-CM | POA: Diagnosis present

## 2021-04-25 DIAGNOSIS — I447 Left bundle-branch block, unspecified: Secondary | ICD-10-CM | POA: Diagnosis present

## 2021-04-25 DIAGNOSIS — I110 Hypertensive heart disease with heart failure: Secondary | ICD-10-CM | POA: Diagnosis present

## 2021-04-25 DIAGNOSIS — E1140 Type 2 diabetes mellitus with diabetic neuropathy, unspecified: Secondary | ICD-10-CM | POA: Diagnosis present

## 2021-04-25 DIAGNOSIS — I2510 Atherosclerotic heart disease of native coronary artery without angina pectoris: Secondary | ICD-10-CM | POA: Diagnosis present

## 2021-04-25 DIAGNOSIS — Z853 Personal history of malignant neoplasm of breast: Secondary | ICD-10-CM

## 2021-04-25 DIAGNOSIS — Z950 Presence of cardiac pacemaker: Secondary | ICD-10-CM | POA: Diagnosis present

## 2021-04-25 DIAGNOSIS — I5032 Chronic diastolic (congestive) heart failure: Secondary | ICD-10-CM | POA: Diagnosis present

## 2021-04-25 DIAGNOSIS — I10 Essential (primary) hypertension: Secondary | ICD-10-CM | POA: Diagnosis present

## 2021-04-25 DIAGNOSIS — S2241XA Multiple fractures of ribs, right side, initial encounter for closed fracture: Secondary | ICD-10-CM

## 2021-04-25 DIAGNOSIS — T383X5A Adverse effect of insulin and oral hypoglycemic [antidiabetic] drugs, initial encounter: Secondary | ICD-10-CM | POA: Diagnosis present

## 2021-04-25 DIAGNOSIS — E785 Hyperlipidemia, unspecified: Secondary | ICD-10-CM | POA: Diagnosis present

## 2021-04-25 DIAGNOSIS — U071 COVID-19: Secondary | ICD-10-CM | POA: Diagnosis present

## 2021-04-25 DIAGNOSIS — E160 Drug-induced hypoglycemia without coma: Secondary | ICD-10-CM | POA: Diagnosis present

## 2021-04-25 DIAGNOSIS — Z8249 Family history of ischemic heart disease and other diseases of the circulatory system: Secondary | ICD-10-CM

## 2021-04-25 DIAGNOSIS — I340 Nonrheumatic mitral (valve) insufficiency: Secondary | ICD-10-CM | POA: Diagnosis present

## 2021-04-25 DIAGNOSIS — E119 Type 2 diabetes mellitus without complications: Secondary | ICD-10-CM

## 2021-04-25 DIAGNOSIS — F419 Anxiety disorder, unspecified: Secondary | ICD-10-CM | POA: Diagnosis present

## 2021-04-25 DIAGNOSIS — E162 Hypoglycemia, unspecified: Secondary | ICD-10-CM | POA: Diagnosis not present

## 2021-04-25 DIAGNOSIS — Y92009 Unspecified place in unspecified non-institutional (private) residence as the place of occurrence of the external cause: Secondary | ICD-10-CM

## 2021-04-25 DIAGNOSIS — E1165 Type 2 diabetes mellitus with hyperglycemia: Secondary | ICD-10-CM | POA: Diagnosis present

## 2021-04-25 DIAGNOSIS — I69351 Hemiplegia and hemiparesis following cerebral infarction affecting right dominant side: Secondary | ICD-10-CM

## 2021-04-25 DIAGNOSIS — F32A Depression, unspecified: Secondary | ICD-10-CM | POA: Diagnosis present

## 2021-04-25 DIAGNOSIS — Z7984 Long term (current) use of oral hypoglycemic drugs: Secondary | ICD-10-CM

## 2021-04-25 DIAGNOSIS — I4891 Unspecified atrial fibrillation: Secondary | ICD-10-CM | POA: Diagnosis present

## 2021-04-25 DIAGNOSIS — T68XXXA Hypothermia, initial encounter: Principal | ICD-10-CM

## 2021-04-25 LAB — CBC WITH DIFFERENTIAL/PLATELET
Abs Immature Granulocytes: 0.03 10*3/uL (ref 0.00–0.07)
Basophils Absolute: 0 10*3/uL (ref 0.0–0.1)
Basophils Relative: 0 %
Eosinophils Absolute: 0 10*3/uL (ref 0.0–0.5)
Eosinophils Relative: 0 %
HCT: 37.7 % (ref 36.0–46.0)
Hemoglobin: 11.9 g/dL — ABNORMAL LOW (ref 12.0–15.0)
Immature Granulocytes: 0 %
Lymphocytes Relative: 7 %
Lymphs Abs: 0.6 10*3/uL — ABNORMAL LOW (ref 0.7–4.0)
MCH: 25.8 pg — ABNORMAL LOW (ref 26.0–34.0)
MCHC: 31.6 g/dL (ref 30.0–36.0)
MCV: 81.6 fL (ref 80.0–100.0)
Monocytes Absolute: 0.5 10*3/uL (ref 0.1–1.0)
Monocytes Relative: 5 %
Neutro Abs: 7.5 10*3/uL (ref 1.7–7.7)
Neutrophils Relative %: 88 %
Platelets: 247 10*3/uL (ref 150–400)
RBC: 4.62 MIL/uL (ref 3.87–5.11)
RDW: 18.1 % — ABNORMAL HIGH (ref 11.5–15.5)
WBC: 8.6 10*3/uL (ref 4.0–10.5)
nRBC: 0 % (ref 0.0–0.2)

## 2021-04-25 LAB — COMPREHENSIVE METABOLIC PANEL
ALT: 16 U/L (ref 0–44)
AST: 26 U/L (ref 15–41)
Albumin: 3.7 g/dL (ref 3.5–5.0)
Alkaline Phosphatase: 61 U/L (ref 38–126)
Anion gap: 10 (ref 5–15)
BUN: 32 mg/dL — ABNORMAL HIGH (ref 8–23)
CO2: 31 mmol/L (ref 22–32)
Calcium: 9.9 mg/dL (ref 8.9–10.3)
Chloride: 97 mmol/L — ABNORMAL LOW (ref 98–111)
Creatinine, Ser: 0.78 mg/dL (ref 0.44–1.00)
GFR, Estimated: >60 mL/min (ref >60)
Glucose, Bld: 195 mg/dL — ABNORMAL HIGH (ref 70–99)
Potassium: 3.1 mmol/L — ABNORMAL LOW (ref 3.5–5.1)
Sodium: 138 mmol/L (ref 135–145)
Total Bilirubin: 0.7 mg/dL (ref 0.3–1.2)
Total Protein: 6.3 g/dL — ABNORMAL LOW (ref 6.5–8.1)

## 2021-04-25 LAB — APTT: aPTT: 33 seconds (ref 24–36)

## 2021-04-25 LAB — PROCALCITONIN: Procalcitonin: <0.1 ng/mL

## 2021-04-25 LAB — LACTIC ACID, PLASMA: Lactic Acid, Venous: 1.4 mmol/L (ref 0.5–1.9)

## 2021-04-25 LAB — CBG MONITORING, ED
Glucose-Capillary: 152 mg/dL — ABNORMAL HIGH (ref 70–99)
Glucose-Capillary: 196 mg/dL — ABNORMAL HIGH (ref 70–99)

## 2021-04-25 LAB — PROTIME-INR
INR: 1 (ref 0.8–1.2)
Prothrombin Time: 13.4 seconds (ref 11.4–15.2)

## 2021-04-25 LAB — GLUCOSE, RANDOM
Glucose, Bld: 189 mg/dL — ABNORMAL HIGH (ref 70–99)
Glucose, Bld: 355 mg/dL — ABNORMAL HIGH (ref 70–99)

## 2021-04-25 LAB — RESP PANEL BY RT-PCR (FLU A&B, COVID) ARPGX2
Influenza A by PCR: NEGATIVE
Influenza B by PCR: NEGATIVE
SARS Coronavirus 2 by RT PCR: POSITIVE — AB

## 2021-04-25 LAB — MAGNESIUM: Magnesium: 2.5 mg/dL — ABNORMAL HIGH (ref 1.7–2.4)

## 2021-04-25 LAB — TROPONIN I (HIGH SENSITIVITY)
Troponin I (High Sensitivity): 13 ng/L (ref <18)
Troponin I (High Sensitivity): 20 ng/L — ABNORMAL HIGH (ref <18)

## 2021-04-25 MED ORDER — SODIUM CHLORIDE 0.9 % IV SOLN: 250.0000 mL | INTRAVENOUS | Status: DC | PRN

## 2021-04-25 MED ORDER — SODIUM CHLORIDE 0.9% FLUSH: 3.0000 mL | INTRAVENOUS | Status: DC | PRN

## 2021-04-25 MED ORDER — MAGNESIUM SULFATE IN D5W 1-5 GM/100ML-% IV SOLN
1.0000 g | Freq: Once | INTRAVENOUS | Status: AC
  Administered 2021-04-25: 1 g via INTRAVENOUS
  Filled 2021-04-25: qty 100

## 2021-04-25 MED ORDER — POTASSIUM CHLORIDE CRYS ER 20 MEQ PO TBCR
40.0000 meq | EXTENDED_RELEASE_TABLET | Freq: Once | ORAL | Status: AC
  Administered 2021-04-25: 40 meq via ORAL
  Filled 2021-04-25: qty 2

## 2021-04-25 MED ORDER — SODIUM CHLORIDE 0.9% FLUSH
3.0000 mL | Freq: Two times a day (BID) | INTRAVENOUS | Status: DC
  Administered 2021-04-25 – 2021-04-29 (×8): 3 mL via INTRAVENOUS

## 2021-04-25 NOTE — ED Provider Notes (Signed)
[facility] Provider Note    Event Date/Time   First MD Initiated Contact with Patient 04/25/21 1128     (approximate)   History   Altered Mental Status and Fall   HPI  Elizabeth Novak is a 88 y.o. female who presents to the ED for evaluation of Altered Mental Status and Fall   I review outpatient PCP visit from 1/17. Hx CAD, CVA, Afib on digoxin, I do not see any anticoagulation on her med list. Patient was started on Levemir 15 units nightly.  Patient presents to the ED via EMS for evaluation of altered mentation and possible hypoglycemia.  Majority of history is provided by the patient's daytime caregiver who is at the bedside, and supplemented by the patient.  Caregiver reports that she was normal yesterday and they gave the first dose of Levemir last night.  When caregiver returned this morning, she found the patient on the ground by herself with much furniture upturned and patient yelling for help.  Patient reports remembering yelling for help, but does not know what happened.  Does not know if she fell, passed out or much of anything.  She reports feeling okay right now.  Caregiver reports that patient seems better now than when she saw her this morning, but not quite normal.  EMS found her with a glucose of 70 and encephalopathic, they provided D10 infusion and she settled down and became more alert and oriented.   Physical Exam   Triage Vital Signs: ED Triage Vitals  Enc Vitals Group     BP 04/25/21 1147 135/73     Pulse Rate 04/25/21 1147 (!) 59     Resp 04/25/21 1147 12     Temp 04/25/21 1147 (!) 92.2 F (33.4 C)     Temp Source 04/25/21 1147 Rectal     SpO2 04/25/21 1147 100 %     Weight 04/25/21 1152 87 lb (39.5 kg)     Height 04/25/21 1152 5\' 4"  (1.626 m)     Head Circumference --      Peak Flow --      Pain Score 04/25/21 1152 0     Pain Loc --      Pain Edu? --      Excl. in [place]? --     Most recent vital signs: Vitals:    04/25/21 1300 04/25/21 1430  BP: 115/64 132/64  Pulse: 61 63  Resp: 14 (!) 22  Temp:    SpO2: 100% 100%    General: Awake, no distress.  Pleasantly disoriented.  Follows commands in all 4 extremities.  Making jokes.  Skin is cool to the touch diffusely. CV:  Good peripheral perfusion. RRR Resp:  Normal effort.  No wheezing Abd:  No distention.  Suprapubic tenderness is present.  No peritoneal features. MSK:  No deformity noted.  No significant deformity or signs of trauma. Neuro:  No focal deficits appreciated. Other:     ED Results / Procedures / Treatments   Labs (all labs ordered are listed, but only abnormal results are displayed) Labs Reviewed  RESP PANEL BY RT-PCR (FLU A&B, COVID) ARPGX2 - Abnormal; Notable for the following components:      Result Value   SARS Coronavirus 2 by RT PCR POSITIVE (*)    All other components within normal limits  COMPREHENSIVE METABOLIC PANEL - Abnormal; Notable for the following components:   Potassium 3.1 (*)    Chloride 97 (*)    Glucose, Bld  195 (*)    BUN 32 (*)    Total Protein 6.3 (*)    All other components within normal limits  CBC WITH DIFFERENTIAL/PLATELET - Abnormal; Notable for the following components:   Hemoglobin 11.9 (*)    MCH 25.8 (*)    RDW 18.1 (*)    Lymphs Abs 0.6 (*)    All other components within normal limits  CBG MONITORING, ED - Abnormal; Notable for the following components:   Glucose-Capillary 152 (*)    All other components within normal limits  CULTURE, BLOOD (ROUTINE X 2)  CULTURE, BLOOD (ROUTINE X 2)  URINE CULTURE  LACTIC ACID, PLASMA  PROTIME-INR  APTT  PROCALCITONIN  LACTIC ACID, PLASMA  URINALYSIS, COMPLETE (UACMP) WITH MICROSCOPIC  TROPONIN I (HIGH SENSITIVITY)  TROPONIN I (HIGH SENSITIVITY)    EKG  Twelve-lead EKG with new left bundle.  Unspecified regular rhythm with a rate of 60 bpm.  Normal axis.  Left bundle with discordant elevations laterally.  Not quite meeting STEMI criteria by  Sgarbossa criteria.  RADIOLOGY CXR reviewed by me without evidence of acute cardiopulmonary pathology.  Official radiology report(s): CT HEAD WO CONTRAST (5MM)  Result Date: 04/25/2021 CLINICAL DATA:  Mental status change EXAM: CT HEAD WITHOUT CONTRAST TECHNIQUE: Contiguous axial images were obtained from the base of the skull through the vertex without intravenous contrast. RADIATION DOSE REDUCTION: This exam was performed according to the departmental dose-optimization program which includes automated exposure control, adjustment of the mA and/or kV according to patient size and/or use of iterative reconstruction technique. COMPARISON:  Head CT dated January 06, 2021 FINDINGS: Brain: Chronic white matter ischemic change. No evidence of acute infarction, hemorrhage, hydrocephalus, extra-axial collection or mass lesion/mass effect. Vascular: No hyperdense vessel or unexpected calcification. Skull: Normal. Negative for fracture or focal lesion. Sinuses/Orbits: No acute finding. Other: None. IMPRESSION: No acute intracranial abnormality Electronically Signed   By: Yetta Glassman M.D.   On: 04/25/2021 14:05   DG Chest Port 1 View  Result Date: 04/25/2021 CLINICAL DATA:  Evaluate for abnormality.  Questionable sepsis. EXAM: PORTABLE CHEST 1 VIEW COMPARISON:  04/05/2021 FINDINGS: There is a left chest wall pacer device with lead in the right ventricle. Cardiac enlargement is unchanged from previous exam. No pleural effusion or edema. No airspace opacities. IMPRESSION: No acute cardiopulmonary abnormalities. Electronically Signed   By: Kerby Moors M.D.   On: 04/25/2021 12:54    PROCEDURES and INTERVENTIONS:  .1-3 Lead EKG Interpretation Performed by: Vladimir Crofts, MD Authorized by: Vladimir Crofts, MD     Interpretation: normal     ECG rate:  60   ECG rate assessment: normal     Rhythm: sinus rhythm     Ectopy: none     Conduction: normal   Ultrasound ED Peripheral IV (Provider)  Date/Time:  04/25/2021 1:33 PM Performed by: Vladimir Crofts, MD Authorized by: Vladimir Crofts, MD   Procedure details:    Indications: multiple failed IV attempts and poor IV access     Skin Prep: chlorhexidine gluconate     Location: left basilic v.   Angiocath:  20 G   Bedside Ultrasound Guided: Yes     Images: not archived     Patient tolerated procedure without complications: Yes     Dressing applied: Yes    Medications - No data to display   IMPRESSION / MDM / [place] / ED COURSE  I reviewed the triage vital signs and the nursing notes.  88 year old woman presents from  home with altered mentation with evidence of hypoglycemia, hypothermia and possible new left bundle, ultimately requiring medical observation admission.  Initial temperature is 92.2, but she is otherwise stable and not meeting sepsis criteria.  No tachycardia or leukocytosis.  She is complaining of feeling chilly, but otherwise has no complaints for me.  No signs of neurologic or vascular deficits, no signs of trauma from any falls overnight.  Work-up is generally reassuring.  Mild hypokalemia, and is hyperglycemic after the D10 infusion with EMS.  No recurrence of hypoglycemia in the ED.  CBC is unremarkable and negative procalcitonin suggest no sepsis or bacterial disease.  She has no complaints of chest pain or anginal equivalents, but does have a new wide-complex left bundle morphology EKG rhythm and negative troponins.  Furthermore, her son is concerned about her living at home and does not think she would benefit from further outpatient care by herself, which is reasonable.  We will discuss with hospitalist and anticipate she will require medical admission.  Clinical Course as of 04/25/21 1508  Fri Apr 25, 2021  1253 USIV placed by for blood work [DS]  1343 Repeat EKG is similar.  Left bundle and not quite meeting STEMI criteria by Sgarbossa. [DS]  2023 [id number]. Still no chest pain [DS]  1507 I call son, Seleena Reimers.  [DS]    Clinical Course User Index [DS] Vladimir Crofts, MD     FINAL CLINICAL IMPRESSION(S) / ED DIAGNOSES   Final diagnoses:  Hypothermia, initial encounter  Hypoglycemia  LBBB (left bundle branch block)     Rx / DC Orders   ED Discharge Orders     None        Note:  This document was prepared using Dragon voice recognition software and may include unintentional dictation errors.   Vladimir Crofts, MD 04/25/21 [phone]

## 2021-04-25 NOTE — H&P (Signed)
History and Physical    Elizabeth Novak ZOX:[id number] DOB: Sep 05, 1923 DOA: 04/25/2021  PCP: Danella Penton, MD  Patient coming from: home  Chief Complaint: found down  HPI: Elizabeth Novak is a 88 y.o. female with medical history significant of dm, chf, htn found down by her day time aid this am confused with a sugar of 70.  Aid reports she was started on lantus 15 units last night was first dose.  Pt is very independent with no h/o dementia.  No recent illnesses.  She did have covid in December and has been asymptomatic of disease since end of December.  Pt has help during the day but spends her nights alone.  Ems was called she was given dextrose and immediately her mental status improved to normal.  In ed found to have temp of 93 currently on bare hugger.  No temp taken in ed since 1147am.  Pt eating crackers and peanut butter.  Referred for admission for hypoglyemia and family wishes for placement.  Repeat glucose so far have been normal or high.   Review of Systems: As per HPI otherwise 10 point review of systems negative.   Past Medical History:  Diagnosis Date   A-fib (HCC)    Anemia    Arthritis    Breast cancer (HCC) 1978   left breast with lymph node removal   CHF (congestive heart failure) (HCC)    Diabetes mellitus without complication (HCC)    Diverticulitis    Dysrhythmia    GERD (gastroesophageal reflux disease)    Hyperlipidemia    Macular degeneration of both eyes    Mitral valve prolapse    Mitral valve regurgitation    Presence of permanent cardiac pacemaker     Past Surgical History:  Procedure Laterality Date   ABDOMINAL HYSTERECTOMY     APPENDECTOMY     BREAST SURGERY     CARDIAC CATHETERIZATION     ESOPHAGEAL DILATION     EYE SURGERY Bilateral    Cataract Extraction with IOL   INSERT / REPLACE / REMOVE PACEMAKER     IRRIGATION AND DEBRIDEMENT HEMATOMA Left 08/21/2015   Procedure: IRRIGATION AND DEBRIDEMENT HEMATOMA;  Surgeon: Earline Mayotte,  MD;  Location: [facility] ORS;  Service: General;  Laterality: Left;   KNEE ARTHROSCOPY Right    LEFT OOPHORECTOMY Left    MASTECTOMY Left 1978   MASTECTOMY Right 1978   OPEN REDUCTION INTERNAL FIXATION (ORIF) DISTAL RADIAL FRACTURE Right 02/14/2018   Procedure: OPEN REDUCTION INTERNAL FIXATION (ORIF) DISTAL RADIAL FRACTURE;  Surgeon: Kennedy Bucker, MD;  Location: [facility] ORS;  Service: Orthopedics;  Laterality: Right;   PACEMAKER INSERTION  08/11/12   PPM GENERATOR CHANGEOUT N/A 05/02/2020   Procedure: PPM GENERATOR CHANGEOUT;  Surgeon: Marcina Millard, MD;  Location: [facility] INVASIVE CV LAB;  Service: Cardiovascular;  Laterality: N/A;   TEMPORAL ARTERY BIOPSY / LIGATION     TONSILLECTOMY       reports that she has never smoked. She has never used smokeless tobacco. She reports that she does not drink alcohol and does not use drugs.  Allergies  Allergen Reactions   Codeine Nausea Only   Disopyramide     Other reaction(s): Unknown   Ibuprofen Diarrhea   Iodine     blisters   Metformin And Related Other (See Comments)    unknown   Norpace [Disopyramide Phosphate]    Quinidine     Other reaction(s): Unknown   Terfenadine     Other reaction(s): Unknown  Topiramate     Other reaction(s): Other (See Comments) Hair loss   Verapamil     Other reaction(s): Unknown   Dexamethasone Sodium Phosphate Palpitations    Family History  Problem Relation Age of Onset   Hypertension Mother     Prior to Admission medications   Medication Sig Start Date End Date Taking? Authorizing Provider  ascorbic acid (VITAMIN C) 500 MG tablet Take 1 tablet (500 mg total) by mouth daily. 03/25/21   Arnetha Courser, MD  atorvastatin (LIPITOR) 20 MG tablet Take 1 tablet (20 mg total) by mouth at bedtime. 05/10/18   Sharee Holster, NP  carboxymethylcellulose (REFRESH PLUS) 0.5 % SOLN 1 drop 3 (three) times daily as needed.    [provider]  digoxin (LANOXIN) 0.125 MG tablet Take 1 tablet (0.125 mg  total) by mouth every other day. CHF- * Hold for HR < 60 05/10/18   Sharee Holster, NP  famotidine (PEPCID) 20 MG tablet Take 20 mg by mouth at bedtime. 08/24/19   [provider]  glipiZIDE (GLUCOTROL XL) 10 MG 24 hr tablet Take 10 mg by mouth daily with breakfast.    [provider]  guaiFENesin-dextromethorphan (ROBITUSSIN DM) 100-10 MG/5ML syrup Take 10 mLs by mouth every 4 (four) hours as needed for cough. 03/24/21   Arnetha Courser, MD  isosorbide mononitrate (IMDUR) 30 MG 24 hr tablet Take 30 mg by mouth daily. 06/05/19   [provider]  metoprolol succinate (TOPROL-XL) 50 MG 24 hr tablet Take 1 tablet (50 mg total) by mouth daily. 05/10/18   Sharee Holster, NP  mirtazapine (REMERON) 15 MG tablet Take 7.5 mg by mouth at bedtime.    [provider]  Multiple Vitamins-Minerals (PRESERVISION AREDS 2 PO) Take 1 tablet by mouth 2 (two) times daily.    [provider]  nitroGLYCERIN (NITROSTAT) 0.4 MG SL tablet Place 0.4 mg under the tongue every 5 (five) minutes as needed for chest pain. 03/08/19   [provider]  omeprazole (PRILOSEC) 20 MG capsule Take 20 mg by mouth 2 (two) times daily. 12/25/19   [provider]  pioglitazone (ACTOS) 15 MG tablet Take 15 mg by mouth daily. 02/25/21   [provider]  potassium chloride (KLOR-CON) 10 MEQ tablet Take 40 mEq by mouth daily. 11/29/19   [provider]  sucralfate (CARAFATE) 1 g tablet Take 1 g by mouth 4 (four) times daily. 03/14/21   [provider]  torsemide (DEMADEX) 20 MG tablet Take 20 mg by mouth 2 (two) times daily. 12/25/19   [provider]  vitamin B-12 1000 MCG tablet Take 1 tablet (1,000 mcg total) by mouth daily. 03/25/21   Arnetha Courser, MD  zinc sulfate 220 (50 Zn) MG capsule Take 1 capsule (220 mg total) by mouth daily. 03/25/21   Arnetha Courser, MD    Physical Exam: Vitals:   04/25/21 1152 04/25/21 1202 04/25/21 1300 04/25/21 1430  BP:   121/60 115/64 132/64  Pulse:  (!) 59 61 63  Resp:  15 14 (!) 22  Temp:      TempSrc:      SpO2:  100% 100% 100%  Weight: 39.5 kg     Height: 5\' 4"  (1.626 m)         Constitutional: NAD, calm, comfortable Vitals:   04/25/21 1152 04/25/21 1202 04/25/21 1300 04/25/21 1430  BP:  121/60 115/64 132/64  Pulse:  (!) 59 61 63  Resp:  15 14 (!) 22  Temp:      TempSrc:      SpO2:  100% 100% 100%  Weight: 39.5 kg     Height: 5\' 4"  (1.626 m)      Eyes: PERRL, lids and conjunctivae normal ENMT: Mucous membranes are moist. Posterior pharynx clear of any exudate or lesions.Normal dentition.  Neck: normal, supple, no masses, no thyromegaly Respiratory: clear to auscultation bilaterally, no wheezing, no crackles. Normal respiratory effort. No accessory muscle use.  Cardiovascular: Regular rate and rhythm, no murmurs / rubs / gallops. No extremity edema. 2+ pedal pulses. No carotid bruits.  Abdomen: no tenderness, no masses palpated. No hepatosplenomegaly. Bowel sounds positive.  Musculoskeletal: no clubbing / cyanosis. No joint deformity upper and lower extremities. Good ROM, no contractures. Normal muscle tone.  Skin: no rashes, lesions, ulcers. No induration Neurologic: CN 2-12 grossly intact. Sensation intact, DTR normal. Strength 5/5 in all 4.  Psychiatric: Normal judgment and insight. Alert and oriented x 3. Normal mood.    Labs on Admission: I have personally reviewed following labs and imaging studies  CBC: Recent Labs  Lab 04/25/21 1240  WBC 8.6  NEUTROABS 7.5  HGB 11.9*  HCT 37.7  MCV 81.6  PLT 247   Basic Metabolic Panel: Recent Labs  Lab 04/25/21 1240  NA 138  K 3.1*  CL 97*  CO2 31  GLUCOSE 195*  BUN 32*  CREATININE 0.78  CALCIUM 9.9   GFR: Estimated Creatinine Clearance: 26.2 mL/min (by C-G formula based on SCr of 0.78 mg/dL). Liver Function Tests: Recent Labs  Lab 04/25/21 1240  AST 26  ALT 16  ALKPHOS 61  BILITOT 0.7  PROT 6.3*  ALBUMIN 3.7   No  results for input(s): LIPASE, AMYLASE in the last 168 hours. No results for input(s): AMMONIA in the last 168 hours. Coagulation Profile: Recent Labs  Lab 04/25/21 1240  INR 1.0   Cardiac Enzymes: No results for input(s): CKTOTAL, CKMB, CKMBINDEX, TROPONINI in the last 168 hours. BNP (last 3 results) No results for input(s): PROBNP in the last 8760 hours. HbA1C: No results for input(s): HGBA1C in the last 72 hours. CBG: Recent Labs  Lab 04/25/21 1131  GLUCAP 152*   Lipid Profile: No results for input(s): CHOL, HDL, LDLCALC, TRIG, CHOLHDL, LDLDIRECT in the last 72 hours. Thyroid Function Tests: No results for input(s): TSH, T4TOTAL, FREET4, T3FREE, THYROIDAB in the last 72 hours. Anemia Panel: No results for input(s): VITAMINB12, FOLATE, FERRITIN, TIBC, IRON, RETICCTPCT in the last 72 hours. Urine analysis:    Component Value Date/Time   COLORURINE YELLOW (A) 04/05/2021 0224   APPEARANCEUR CLEAR (A) 04/05/2021 0224   LABSPEC 1.010 04/05/2021 0224   PHURINE 6.0 04/05/2021 0224   GLUCOSEU >=500 (A) 04/05/2021 0224   HGBUR NEGATIVE 04/05/2021 0224   BILIRUBINUR NEGATIVE 04/05/2021 0224   KETONESUR NEGATIVE 04/05/2021 0224   PROTEINUR NEGATIVE 04/05/2021 0224   NITRITE NEGATIVE 04/05/2021 0224   LEUKOCYTESUR NEGATIVE 04/05/2021 0224   Sepsis Labs: !!!!!!!!!!!!!!!!!!!!!!!!!!!!!!!!!!!!!!!!!!!! @LABRCNTIP (procalcitonin:4,lacticidven:4) ) Recent Results (from the past 240 hour(s))  Resp Panel by RT-PCR (Flu A&B, Covid) Nasopharyngeal Swab     Status: Abnormal   Collection Time: 04/25/21 12:40 PM   Specimen: Nasopharyngeal Swab; Nasopharyngeal(NP) swabs in vial transport medium  Result Value Ref Range Status   SARS Coronavirus 2 by RT PCR POSITIVE (A) NEGATIVE Final    Comment: (NOTE) SARS-CoV-2 target nucleic acids are DETECTED.  The SARS-CoV-2 RNA is generally detectable in upper respiratory specimens during the acute phase of infection. Positive results  are indicative  of the presence of the identified virus, but do not rule out bacterial infection or co-infection with other pathogens not detected by the test. Clinical correlation with patient history and other diagnostic information is necessary to determine patient infection status. The expected result is Negative.  Fact Sheet for Patients: BloggerCourse.com  Fact Sheet for Healthcare Providers: SeriousBroker.it  This test is not yet approved or cleared by the [place] FDA and  has been authorized for detection and/or diagnosis of SARS-CoV-2 by FDA under an Emergency Use Authorization (EUA).  This EUA will remain in effect (meaning this test can be used) for the duration of  the COVID-19 declaration under Section 564(b)(1) of the A ct, 21 U.S.C. section 360bbb-3(b)(1), unless the authorization is terminated or revoked sooner.     Influenza A by PCR NEGATIVE NEGATIVE Final   Influenza B by PCR NEGATIVE NEGATIVE Final    Comment: (NOTE) The Xpert Xpress SARS-CoV-2/FLU/RSV plus assay is intended as an aid in the diagnosis of influenza from Nasopharyngeal swab specimens and should not be used as a sole basis for treatment. Nasal washings and aspirates are unacceptable for Xpert Xpress SARS-CoV-2/FLU/RSV testing.  Fact Sheet for Patients: BloggerCourse.com  Fact Sheet for Healthcare Providers: SeriousBroker.it  This test is not yet approved or cleared by the [place] FDA and has been authorized for detection and/or diagnosis of SARS-CoV-2 by FDA under an Emergency Use Authorization (EUA). This EUA will remain in effect (meaning this test can be used) for the duration of the COVID-19 declaration under Section 564(b)(1) of the Act, 21 U.S.C. section 360bbb-3(b)(1), unless the authorization is terminated or revoked.  Performed at [facility], [address]  [place], [place] [address]      Radiological Exams on Admission: CT HEAD WO CONTRAST ( )  Result Date: 04/25/2021 CLINICAL DATA:  Mental status change EXAM: CT HEAD WITHOUT CONTRAST TECHNIQUE: Contiguous axial images were obtained from the base of the skull through the vertex without intravenous contrast. RADIATION DOSE REDUCTION: This exam was performed according to the departmental dose-optimization program which includes automated exposure control, adjustment of the mA and/or kV according to patient size and/or use of iterative reconstruction technique. COMPARISON:  Head CT dated January 06, 2021 FINDINGS: Brain: Chronic white matter ischemic change. No evidence of acute infarction, hemorrhage, hydrocephalus, extra-axial collection or mass lesion/mass effect. Vascular: No hyperdense vessel or unexpected calcification. Skull: Normal. Negative for fracture or focal lesion. Sinuses/Orbits: No acute finding. Other: None. IMPRESSION: No acute intracranial abnormality Electronically Signed   By: Allegra Lai M.D.   On: 04/25/2021 14:05   DG Chest Port 1 View  Result Date: 04/25/2021 CLINICAL DATA:  Evaluate for abnormality.  Questionable sepsis. EXAM: PORTABLE CHEST 1 VIEW COMPARISON:  04/05/2021 FINDINGS: There is a left chest wall pacer device with lead in the right ventricle. Cardiac enlargement is unchanged from previous exam. No pleural effusion or edema. No airspace opacities. IMPRESSION: No acute cardiopulmonary abnormalities. Electronically Signed   By: Signa Kell M.D.   On: 04/25/2021 12:54    EKG: Independently reviewed. Junctional rhythm wide complex Old chart reviewed Case discussed with edp  Assessment/Plan  88 yo female with acute metabolic encephalopathy from hypoglycemia insulin induced  Principal Problem:    Acute metabolic encephalopathy- pt just started new lantus last night at 15 units.  Last hga1c 9.3% a month ago.  Hold all  insulin products.  Mental status quickly  returned to normal with iv dextrose.  No focal neuro  def.   Hypothermic in ed on bare hugger.  Repeat temp now.  Mental status at normal now.  Active Problems:    Hypoglycemia due to insulin- as above    COVID-19 virus infection-asymptomatic.  Active infection in dec 22.  Cxr normal.  No further treatment.  Does not need isolation.    S/P placement of cardiac pacemaker- noted, with wide complex rhythm ck mag, replete k.  Give mag sulfate 1 gm now    HTN (hypertension)- clarify and resume home meds    Diabetes mellitus without complication (HCC)- hold meds as above    Chronic diastolic CHF (congestive heart failure) (HCC)- stable and compensated   Son wants her placed in snf.   Obtain PT eval.    Further recs pending overall hospital course   DVT prophylaxis: scds  Code Status: full  Family Communication: son on phone  Disposition Plan: 1-2 days  Consults called: none  Admission status: observation    Elizabeth Novak A MD [facility]  If 7PM-7AM, please contact night-coverage [email or web address] Password [facility]  04/25/2021, 3:23 PM

## 2021-04-25 NOTE — ED Notes (Signed)
Pt transported to CT ?

## 2021-04-25 NOTE — ED Triage Notes (Signed)
Pt arrived by EMS. Home health aid found pt on the floor this morning when she went to check on her . Pt was changed to new inlsuin and was given it last night at 550pm. Her sugar was 70 this morning and pt was altered per home health staff  Ems gave d10 and pt started to become more alert and oriented, CBG increased to 170   Pt is alert and oriented on arrival and denies pain

## 2021-04-26 ENCOUNTER — Encounter: Payer: Self-pay | Admitting: Family Medicine

## 2021-04-26 DIAGNOSIS — Z8249 Family history of ischemic heart disease and other diseases of the circulatory system: Secondary | ICD-10-CM | POA: Diagnosis not present

## 2021-04-26 DIAGNOSIS — Y92009 Unspecified place in unspecified non-institutional (private) residence as the place of occurrence of the external cause: Secondary | ICD-10-CM | POA: Diagnosis not present

## 2021-04-26 DIAGNOSIS — I10 Essential (primary) hypertension: Secondary | ICD-10-CM

## 2021-04-26 DIAGNOSIS — U071 COVID-19: Secondary | ICD-10-CM

## 2021-04-26 DIAGNOSIS — E1140 Type 2 diabetes mellitus with diabetic neuropathy, unspecified: Secondary | ICD-10-CM | POA: Diagnosis present

## 2021-04-26 DIAGNOSIS — Z950 Presence of cardiac pacemaker: Secondary | ICD-10-CM | POA: Diagnosis not present

## 2021-04-26 DIAGNOSIS — I69351 Hemiplegia and hemiparesis following cerebral infarction affecting right dominant side: Secondary | ICD-10-CM | POA: Diagnosis not present

## 2021-04-26 DIAGNOSIS — I5032 Chronic diastolic (congestive) heart failure: Secondary | ICD-10-CM

## 2021-04-26 DIAGNOSIS — I2510 Atherosclerotic heart disease of native coronary artery without angina pectoris: Secondary | ICD-10-CM | POA: Diagnosis present

## 2021-04-26 DIAGNOSIS — G9341 Metabolic encephalopathy: Secondary | ICD-10-CM | POA: Diagnosis present

## 2021-04-26 DIAGNOSIS — T383X5A Adverse effect of insulin and oral hypoglycemic [antidiabetic] drugs, initial encounter: Secondary | ICD-10-CM

## 2021-04-26 DIAGNOSIS — I340 Nonrheumatic mitral (valve) insufficiency: Secondary | ICD-10-CM | POA: Diagnosis present

## 2021-04-26 DIAGNOSIS — E119 Type 2 diabetes mellitus without complications: Secondary | ICD-10-CM

## 2021-04-26 DIAGNOSIS — E876 Hypokalemia: Secondary | ICD-10-CM | POA: Diagnosis present

## 2021-04-26 DIAGNOSIS — T68XXXA Hypothermia, initial encounter: Secondary | ICD-10-CM

## 2021-04-26 DIAGNOSIS — W19XXXA Unspecified fall, initial encounter: Secondary | ICD-10-CM | POA: Diagnosis present

## 2021-04-26 DIAGNOSIS — F32A Depression, unspecified: Secondary | ICD-10-CM | POA: Diagnosis present

## 2021-04-26 DIAGNOSIS — E160 Drug-induced hypoglycemia without coma: Secondary | ICD-10-CM

## 2021-04-26 DIAGNOSIS — S2241XA Multiple fractures of ribs, right side, initial encounter for closed fracture: Secondary | ICD-10-CM | POA: Diagnosis present

## 2021-04-26 DIAGNOSIS — I4891 Unspecified atrial fibrillation: Secondary | ICD-10-CM | POA: Diagnosis present

## 2021-04-26 DIAGNOSIS — E1165 Type 2 diabetes mellitus with hyperglycemia: Secondary | ICD-10-CM | POA: Diagnosis present

## 2021-04-26 DIAGNOSIS — F419 Anxiety disorder, unspecified: Secondary | ICD-10-CM | POA: Diagnosis present

## 2021-04-26 DIAGNOSIS — E11649 Type 2 diabetes mellitus with hypoglycemia without coma: Secondary | ICD-10-CM | POA: Diagnosis present

## 2021-04-26 DIAGNOSIS — E162 Hypoglycemia, unspecified: Secondary | ICD-10-CM | POA: Diagnosis present

## 2021-04-26 DIAGNOSIS — I110 Hypertensive heart disease with heart failure: Secondary | ICD-10-CM | POA: Diagnosis present

## 2021-04-26 DIAGNOSIS — Z79899 Other long term (current) drug therapy: Secondary | ICD-10-CM | POA: Diagnosis not present

## 2021-04-26 DIAGNOSIS — Z853 Personal history of malignant neoplasm of breast: Secondary | ICD-10-CM | POA: Diagnosis not present

## 2021-04-26 DIAGNOSIS — Z7984 Long term (current) use of oral hypoglycemic drugs: Secondary | ICD-10-CM | POA: Diagnosis not present

## 2021-04-26 DIAGNOSIS — I447 Left bundle-branch block, unspecified: Secondary | ICD-10-CM | POA: Diagnosis present

## 2021-04-26 DIAGNOSIS — E785 Hyperlipidemia, unspecified: Secondary | ICD-10-CM | POA: Diagnosis present

## 2021-04-26 LAB — CBC
HCT: 34.9 % — ABNORMAL LOW (ref 36.0–46.0)
Hemoglobin: 10.9 g/dL — ABNORMAL LOW (ref 12.0–15.0)
MCH: 25.5 pg — ABNORMAL LOW (ref 26.0–34.0)
MCHC: 31.2 g/dL (ref 30.0–36.0)
MCV: 81.5 fL (ref 80.0–100.0)
Platelets: 255 10*3/uL (ref 150–400)
RBC: 4.28 MIL/uL (ref 3.87–5.11)
RDW: 18.6 % — ABNORMAL HIGH (ref 11.5–15.5)
WBC: 6.4 10*3/uL (ref 4.0–10.5)
nRBC: 0 % (ref 0.0–0.2)

## 2021-04-26 LAB — BASIC METABOLIC PANEL
Anion gap: 10 (ref 5–15)
BUN: 29 mg/dL — ABNORMAL HIGH (ref 8–23)
CO2: 31 mmol/L (ref 22–32)
Calcium: 9.7 mg/dL (ref 8.9–10.3)
Chloride: 99 mmol/L (ref 98–111)
Creatinine, Ser: 0.8 mg/dL (ref 0.44–1.00)
GFR, Estimated: >60 mL/min (ref >60)
Glucose, Bld: 167 mg/dL — ABNORMAL HIGH (ref 70–99)
Potassium: 4.2 mmol/L (ref 3.5–5.1)
Sodium: 140 mmol/L (ref 135–145)

## 2021-04-26 LAB — BLOOD CULTURE ID PANEL (REFLEXED) - BCID2

## 2021-04-26 LAB — GLUCOSE, CAPILLARY
Glucose-Capillary: 111 mg/dL — ABNORMAL HIGH (ref 70–99)
Glucose-Capillary: 115 mg/dL — ABNORMAL HIGH (ref 70–99)
Glucose-Capillary: 152 mg/dL — ABNORMAL HIGH (ref 70–99)
Glucose-Capillary: 152 mg/dL — ABNORMAL HIGH (ref 70–99)
Glucose-Capillary: 172 mg/dL — ABNORMAL HIGH (ref 70–99)
Glucose-Capillary: 410 mg/dL — ABNORMAL HIGH (ref 70–99)

## 2021-04-26 LAB — PROCALCITONIN: Procalcitonin: <0.1 ng/mL

## 2021-04-26 MED ORDER — ATORVASTATIN CALCIUM 20 MG PO TABS
20.0000 mg | ORAL_TABLET | Freq: Every day | ORAL | Status: DC
  Administered 2021-04-26 – 2021-04-28 (×3): 20 mg via ORAL
  Filled 2021-04-26 (×3): qty 1

## 2021-04-26 MED ORDER — METOPROLOL SUCCINATE ER 50 MG PO TB24
50.0000 mg | ORAL_TABLET | Freq: Every day | ORAL | Status: DC
  Administered 2021-04-26 – 2021-04-29 (×4): 50 mg via ORAL
  Filled 2021-04-26 (×4): qty 1

## 2021-04-26 MED ORDER — GLIPIZIDE ER 10 MG PO TB24
10.0000 mg | ORAL_TABLET | Freq: Every day | ORAL | Status: DC
  Administered 2021-04-27 – 2021-04-29 (×3): 10 mg via ORAL
  Filled 2021-04-26 (×3): qty 1

## 2021-04-26 MED ORDER — INSULIN GLARGINE-YFGN 100 UNIT/ML ~~LOC~~ SOLN
10.0000 [IU] | Freq: Every day | SUBCUTANEOUS | Status: DC
  Administered 2021-04-26 – 2021-04-29 (×4): 10 [IU] via SUBCUTANEOUS
  Filled 2021-04-26 (×5): qty 0.1

## 2021-04-26 MED ORDER — ISOSORBIDE MONONITRATE ER 30 MG PO TB24
30.0000 mg | ORAL_TABLET | Freq: Every day | ORAL | Status: DC
  Administered 2021-04-26 – 2021-04-29 (×4): 30 mg via ORAL
  Filled 2021-04-26 (×4): qty 1

## 2021-04-26 MED ORDER — MIRTAZAPINE 15 MG PO TABS
7.5000 mg | ORAL_TABLET | Freq: Every day | ORAL | Status: DC
  Administered 2021-04-26 – 2021-04-28 (×3): 7.5 mg via ORAL
  Filled 2021-04-26 (×3): qty 1

## 2021-04-26 MED ORDER — FAMOTIDINE 20 MG PO TABS
20.0000 mg | ORAL_TABLET | Freq: Every day | ORAL | Status: DC
  Administered 2021-04-26 – 2021-04-28 (×3): 20 mg via ORAL
  Filled 2021-04-26 (×3): qty 1

## 2021-04-26 MED ORDER — DIGOXIN 125 MCG PO TABS
0.1250 mg | ORAL_TABLET | ORAL | Status: DC
  Administered 2021-04-26 – 2021-04-28 (×2): 0.125 mg via ORAL
  Filled 2021-04-26 (×2): qty 1

## 2021-04-26 MED ORDER — POLYVINYL ALCOHOL 1.4 % OP SOLN
1.0000 [drp] | Freq: Three times a day (TID) | OPHTHALMIC | Status: DC | PRN
  Filled 2021-04-26: qty 15

## 2021-04-26 NOTE — Progress Notes (Signed)
PHARMACY - PHYSICIAN COMMUNICATION CRITICAL VALUE ALERT - BLOOD CULTURE IDENTIFICATION (BCID)  Elizabeth Novak is an 88 y.o. female who presented to [facility] on 04/25/2021 with a chief complaint of metabolic encephalopathy  Assessment:  Strep species in 1 of 4 bottles, (aerobic) (include suspected source if known)  Name of physician (or Provider) Contacted:  Ermalene Searing, MD   Current antibiotics: none   Changes to prescribed antibiotics recommended:  MD wants to hold off on treatment, could be contaminant.   Results for orders placed or performed during the hospital encounter of 04/25/21  Blood Culture ID Panel (Reflexed) (Collected: 04/25/2021 12:40 PM)  Result Value Ref Range   Enterococcus faecalis NOT DETECTED NOT DETECTED   Enterococcus Faecium NOT DETECTED NOT DETECTED   Listeria monocytogenes NOT DETECTED NOT DETECTED   Staphylococcus species NOT DETECTED NOT DETECTED   Staphylococcus aureus (BCID) NOT DETECTED NOT DETECTED   Staphylococcus epidermidis NOT DETECTED NOT DETECTED   Staphylococcus lugdunensis NOT DETECTED NOT DETECTED   Streptococcus species DETECTED (A) NOT DETECTED   Streptococcus agalactiae NOT DETECTED NOT DETECTED   Streptococcus pneumoniae NOT DETECTED NOT DETECTED   Streptococcus pyogenes NOT DETECTED NOT DETECTED   A.calcoaceticus-baumannii NOT DETECTED NOT DETECTED   Bacteroides fragilis NOT DETECTED NOT DETECTED   Enterobacterales NOT DETECTED NOT DETECTED   Enterobacter cloacae complex NOT DETECTED NOT DETECTED   Escherichia coli NOT DETECTED NOT DETECTED   Klebsiella aerogenes NOT DETECTED NOT DETECTED   Klebsiella oxytoca NOT DETECTED NOT DETECTED   Klebsiella pneumoniae NOT DETECTED NOT DETECTED   Proteus species NOT DETECTED NOT DETECTED   Salmonella species NOT DETECTED NOT DETECTED   Serratia marcescens NOT DETECTED NOT DETECTED   Haemophilus influenzae NOT DETECTED NOT DETECTED   Neisseria meningitidis NOT DETECTED NOT DETECTED    Pseudomonas aeruginosa NOT DETECTED NOT DETECTED   Stenotrophomonas maltophilia NOT DETECTED NOT DETECTED   Candida albicans NOT DETECTED NOT DETECTED   Candida auris NOT DETECTED NOT DETECTED   Candida glabrata NOT DETECTED NOT DETECTED   Candida krusei NOT DETECTED NOT DETECTED   Candida parapsilosis NOT DETECTED NOT DETECTED   Candida tropicalis NOT DETECTED NOT DETECTED   Cryptococcus neoformans/gattii NOT DETECTED NOT DETECTED    Breasia Karges D 04/26/2021  7:10 AM

## 2021-04-26 NOTE — Progress Notes (Signed)
Patients son, Arriel Victor is requesting that patient go to [facility] at discharge.

## 2021-04-26 NOTE — Progress Notes (Signed)
PROGRESS NOTE  Elizabeth Novak    DOB: 06/04/1923, 88 y.o.  MWN:[id number]  PCP: Danella Penton, MD   Code Status: Full Code   DOA: 04/25/2021   LOS: 0  Brief Narrative of Current Hospitalization  Elizabeth Novak is a 88 y.o. female with a PMH significant for Type II DM, HTN, HLD, neuropathy, h/o SSS s/p pacemaker, Afib, CAD, HFpEF, h/o CVA with right-sided weakness residual, anxiety/depression. They presented from home to the ED on 04/25/2021 after a fall and being down x1 days. She was found to have altered mental status from hypoglycemia on EMS arrival which corrected quickly with administration of glucose. Of note, she had just started QHS insulin the previous night. In the ED, it was found that they had hypothermia, and normalized glucose. They were treated with close glucose monitoring.  Patient was admitted to medicine service for further workup and management of glucose monitoring as outlined in detail below.  04/26/21 -stable, improved  Assessment & Plan  Principal Problem:   Acute metabolic encephalopathy Active Problems:   S/P placement of cardiac pacemaker   HTN (hypertension)   Diabetes mellitus without complication (HCC)   Chronic diastolic CHF (congestive heart failure) (HCC)   COVID-19 virus infection   Hypoglycemia due to insulin   Hypoglycemia  Acute metabolic encephalopathy- resolved. Patient is at baseline mentation. She is very clear with the fact that she does not want to go to SNF. She wishes to be discharged home. PT recommending SNF. Was living independently prior to current illness.  - PT/OT evaluation  DM type II   neuropathy   HLD- would not recommend QHS insulin as there is a high risk of hypoglycemia with this dosing. Patient has elevated blood sugars to 410 after lunch so would recommend starting very low long-acting insulin in am with close glucose watching. - semglee 10units am daily - close glucose monitoring - diabetes education.  - continue  home glipizide, atorvastatin  HTN   h/o SSS s/p pacemaker   HFpEF   Afib   CAD- chronic, stable - continue home digoxin, metoprolol, imdur  H/o CVA with residual R-sided weakness- chronic, stable - PT/OT  Anxiety/depression- chronic, stable - continue home mirtazapine  DVT prophylaxis: SCDs Start: 04/25/21 1707   Diet:  Diet Orders (From admission, onward)     Start     Ordered   04/25/21 1708  Diet Heart Room service appropriate? Yes; Fluid consistency: Thin  Diet effective now       Question Answer Comment  Room service appropriate? Yes   Fluid consistency: Thin      04/25/21 1708            Subjective 04/26/21    Pt reports feeling well. She endorses falling and not being able to get up but remembers the whole event. She does not want to go to a SNF.  Disposition Plan & Communication  Patient status: Observation  Admitted From: Home Disposition: TBD Anticipated discharge date: 1/22  Family Communication: none  Consults, Procedures, Significant Events  Consultants:  none  Procedures/significant events:  None  Antimicrobials:  Anti-infectives (From admission, onward)    None       Objective   Vitals:   04/25/21 1929 04/25/21 2000 04/25/21 2108 04/26/21 0324  BP: (!) 144/56 (!) 135/53 (!) 131/57 (!) 149/56  Pulse: 68 95 (!) 58 60  Resp: 16 14 18 16   Temp: 98.6 F (37 C)  98 F (36.7 C) 98.2 F (36.8 C)  TempSrc: Oral     SpO2: 100% (!) 71% 95% 99%  Weight:      Height:        Intake/Output Summary (Last 24 hours) at 04/26/2021 0617 Last data filed at 04/26/2021 0331 Gross per 24 hour  Intake 240 ml  Output 0 ml  Net 240 ml   Filed Weights   04/25/21 1152  Weight: 39.5 kg    Patient BMI: Body mass index is 14.93 kg/m.   Physical Exam:  General: awake, alert, NAD HEENT: atraumatic, clear conjunctiva, anicteric sclera, MMM, hearing grossly normal Respiratory: normal respiratory effort. Cardiovascular: normal S1/S2, RRR, no JVD,  murmurs, quick capillary refill  Gastrointestinal: soft, NT, ND Nervous: A&O x3. no gross focal neurologic deficits, normal speech Extremities: moves all equally, no edema, normal tone Skin: dry, intact, normal temperature, normal color. No rashes, lesions or ulcers on exposed skin Psychiatry: normal mood, congruent affect  Labs   I have personally reviewed following labs and imaging studies Admission on 04/25/2021  Component Date Value Ref Range Status   Glucose-Capillary 04/25/2021 152 (H)  70 - 99 mg/dL Final   Lactic Acid, Venous 04/25/2021 1.4  0.5 - 1.9 mmol/L Final   Sodium 04/25/2021 138  135 - 145 mmol/L Final   Potassium 04/25/2021 3.1 (L)  3.5 - 5.1 mmol/L Final   Chloride 04/25/2021 97 (L)  98 - 111 mmol/L Final   CO2 04/25/2021 31  22 - 32 mmol/L Final   Glucose, Bld 04/25/2021 195 (H)  70 - 99 mg/dL Final   BUN 78/29/5621 32 (H)  8 - 23 mg/dL Final   Creatinine, Ser 04/25/2021 0.78  0.44 - 1.00 mg/dL Final   Calcium 30/86/5784 9.9  8.9 - 10.3 mg/dL Final   Total Protein 69/62/9528 6.3 (L)  6.5 - 8.1 g/dL Final   Albumin 41/32/4401 3.7  3.5 - 5.0 g/dL Final   AST 02/72/5366 26  15 - 41 U/L Final   ALT 04/25/2021 16  0 - 44 U/L Final   Alkaline Phosphatase 04/25/2021 61  38 - 126 U/L Final   Total Bilirubin 04/25/2021 0.7  0.3 - 1.2 mg/dL Final   GFR, Estimated 04/25/2021 >60  >60 mL/min Final   Anion gap 04/25/2021 10  5 - 15 Final   WBC 04/25/2021 8.6  4.0 - 10.5 K/uL Final   RBC 04/25/2021 4.62  3.87 - 5.11 MIL/uL Final   Hemoglobin 04/25/2021 11.9 (L)  12.0 - 15.0 g/dL Final   HCT 44/06/4740 37.7  36.0 - 46.0 % Final   MCV 04/25/2021 81.6  80.0 - 100.0 fL Final   MCH 04/25/2021 25.8 (L)  26.0 - 34.0 pg Final   MCHC 04/25/2021 31.6  30.0 - 36.0 g/dL Final   RDW 59/56/3875 18.1 (H)  11.5 - 15.5 % Final   Platelets 04/25/2021 247  150 - 400 K/uL Final   nRBC 04/25/2021 0.0  0.0 - 0.2 % Final   Neutrophils Relative % 04/25/2021 88  % Final   Neutro Abs 04/25/2021  7.5  1.7 - 7.7 K/uL Final   Lymphocytes Relative 04/25/2021 7  % Final   Lymphs Abs 04/25/2021 0.6 (L)  0.7 - 4.0 K/uL Final   Monocytes Relative 04/25/2021 5  % Final   Monocytes Absolute 04/25/2021 0.5  0.1 - 1.0 K/uL Final   Eosinophils Relative 04/25/2021 0  % Final   Eosinophils Absolute 04/25/2021 0.0  0.0 - 0.5 K/uL Final   Basophils Relative 04/25/2021 0  % Final  Basophils Absolute 04/25/2021 0.0  0.0 - 0.1 K/uL Final   Immature Granulocytes 04/25/2021 0  % Final   Abs Immature Granulocytes 04/25/2021 0.03  0.00 - 0.07 K/uL Final   Prothrombin Time 04/25/2021 13.4  11.4 - 15.2 seconds Final   INR 04/25/2021 1.0  0.8 - 1.2 Final   aPTT 04/25/2021 33  24 - 36 seconds Final   Specimen Description 04/25/2021 BLOOD RIGHT ANTECUBITAL   Final   Special Requests 04/25/2021 BOTTLES DRAWN AEROBIC AND ANAEROBIC Blood Culture results may not be optimal due to an excessive volume of blood received in culture bottles   Final   Culture  Setup Time 04/25/2021    Final                   Value:AEROBIC BOTTLE ONLY GRAM POSITIVE COCCI Organism ID to follow CRITICAL RESULT CALLED TO, READ BACK BY AND VERIFIED WITH: Performed at [facility], [address] [place], [place] [address]    Culture 04/25/2021 [facility] POSITIVE COCCI   Final   Report Status 04/25/2021 PENDING   Incomplete   SARS Coronavirus 2 by RT PCR 04/25/2021 POSITIVE (A)  NEGATIVE Final   Influenza A by PCR 04/25/2021 NEGATIVE  NEGATIVE Final   Influenza B by PCR 04/25/2021 NEGATIVE  NEGATIVE Final   Procalcitonin 04/25/2021 <0.10  ng/mL Final   Troponin I (High Sensitivity) 04/25/2021 13  <18 ng/L Final   Troponin I (High Sensitivity) 04/25/2021 20 (H)  <18 ng/L Final   Glucose, Bld 04/25/2021 189 (H)  70 - 99 mg/dL Final   Glucose, Bld 13/24/4010 355 (H)  70 - 99 mg/dL Final   Magnesium 27/25/3664 2.5 (H)  1.7 - 2.4 mg/dL Final   Glucose-Capillary 04/25/2021 196 (H)  70 - 99 mg/dL Final   Glucose-Capillary 04/26/2021  152 (H)  70 - 99 mg/dL Final    Imaging Studies  CT HEAD WO CONTRAST ( )  Result Date: 04/25/2021 CLINICAL DATA:  Mental status change EXAM: CT HEAD WITHOUT CONTRAST TECHNIQUE: Contiguous axial images were obtained from the base of the skull through the vertex without intravenous contrast. RADIATION DOSE REDUCTION: This exam was performed according to the departmental dose-optimization program which includes automated exposure control, adjustment of the mA and/or kV according to patient size and/or use of iterative reconstruction technique. COMPARISON:  Head CT dated January 06, 2021 FINDINGS: Brain: Chronic white matter ischemic change. No evidence of acute infarction, hemorrhage, hydrocephalus, extra-axial collection or mass lesion/mass effect. Vascular: No hyperdense vessel or unexpected calcification. Skull: Normal. Negative for fracture or focal lesion. Sinuses/Orbits: No acute finding. Other: None. IMPRESSION: No acute intracranial abnormality Electronically Signed   By: Allegra Lai M.D.   On: 04/25/2021 14:05   DG Chest Port 1 View  Result Date: 04/25/2021 CLINICAL DATA:  Evaluate for abnormality.  Questionable sepsis. EXAM: PORTABLE CHEST 1 VIEW COMPARISON:  04/05/2021 FINDINGS: There is a left chest wall pacer device with lead in the right ventricle. Cardiac enlargement is unchanged from previous exam. No pleural effusion or edema. No airspace opacities. IMPRESSION: No acute cardiopulmonary abnormalities. Electronically Signed   By: Signa Kell M.D.   On: 04/25/2021 12:54    Medications   Scheduled Meds:  sodium chloride flush  3 mL Intravenous Q12H   No recently discontinued medications to reconcile  LOS: 0 days   Leeroy Bock, DO [facility] 04/26/2021, 6:17 AM   Available by Epic secure chat 7AM-7PM. If 7PM-7AM, please contact night-coverage Refer to [email or web address] to  contact the [facility] Attending or Consulting provider for this pt

## 2021-04-26 NOTE — Evaluation (Signed)
Physical Therapy Evaluation Patient Details Name: Elizabeth Novak MRN: [id number] DOB: [birth year] Today's Date: 04/26/2021  History of Present Illness  Patient is a 88 year old female who presented to [facility] ED on 1/20 after, per ED traige note, Home health aid found pt on the floor this morning when she went to check on her. Patient admitted for acute metabolic encephalopathy. Patient has a PMH of CAD, CVA, Afib on digoxin  Clinical Impression  Patient tolerated session well and was agreeable to treatment. Very pleasant. Upon arrival patient was supine in bed w/ HOB elevated. Reported no pain throughout entire session, however did complain of mild numbness in the LLE. Per patient is was Mod I prior to hospitalization with mobility (utilized 2-wheeled RW) and her home-aide helped her with meals. Per RN at conclusion of session, son is requesting patient go to a SNF [facility]) as the home aide will not be returning to assist patient following discharge, and patient will have no assistance at home. During session patient demonstrated generalized weakness in BUE and BLE ranging from 3-to 3+/5 strength. All bed mobility and transfers patient completed Mod I with increased time and effort. Ambulating around the room however patient required CGA-SBA due to mild unsteadiness on feet. Patient would continue to benefit from skilled physical therapy in order to optimize patient's return to PLOF. Recommend SNF at discharge from acute hospitalization.      Recommendations for follow up therapy are one component of a multi-disciplinary discharge planning process, led by the attending physician.  Recommendations may be updated based on patient status, additional functional criteria and insurance authorization.  Follow Up Recommendations Skilled nursing-short term rehab (<3 hours/day)    Assistance Recommended at Discharge Frequent or constant Supervision/Assistance  Patient can return home with the  following  Two people to help with walking and/or transfers;A lot of help with bathing/dressing/bathroom;Assistance with cooking/housework;Direct supervision/assist for medications management;Assist for transportation;Help with stairs or ramp for entrance;Direct supervision/assist for financial management;A lot of help with walking and/or transfers;Two people to help with bathing/dressing/bathroom    Equipment Recommendations Other (comment) (defer to next level of care)  Recommendations for Other Services       Functional Status Assessment Patient has had a recent decline in their functional status and demonstrates the ability to make significant improvements in function in a reasonable and predictable amount of time.     Precautions / Restrictions Precautions Precautions: Fall Restrictions Weight Bearing Restrictions: No      Mobility  Bed Mobility Overal bed mobility: Modified Independent                  Transfers Overall transfer level: Modified independent Equipment used: Rolling walker (2 wheels) (increased time and effort)                    Ambulation/Gait Ambulation/Gait assistance: Min guard, Supervision Gait Distance (Feet): 20 Feet Assistive device: Rolling walker (2 wheels)   Gait velocity: significantly decreased Gait velocity interpretation: <1.8 ft/sec, indicate of risk for recurrent falls   General Gait Details: CGA-SBA with mild unsteadiness, no major LOB noted, during ambulation patient reported numbness in LLE (RN notified)- however sensation when tested by Thereasa Parkin was intact  Information systems manager Rankin (Stroke Patients Only)       Balance Overall balance assessment: Needs assistance Sitting-balance support: No upper extremity supported, Feet supported Sitting balance-Leahy Scale: Good  Standing balance support: Bilateral upper extremity supported, During functional activity, Reliant on  assistive device for balance Standing balance-Leahy Scale: Fair Standing balance comment: reliant on AD for balance during functional tasks                             Pertinent Vitals/Pain Pain Assessment Pain Assessment: 0-10 Pain Score: 0-No pain Pain Intervention(s): Limited activity within patient's tolerance, Monitored during session, Repositioned    Home Living Family/patient expects to be discharged to:: Private residence Living Arrangements: Alone Available Help at Discharge: Family Type of Home: House Home Access: Stairs to enter Entrance Stairs-Rails: Right;Can reach both Secretary/administrator of Steps: 2   Home Layout: One level Home Equipment: Grab bars - tub/shower;Grab bars - toilet;Cane - quad;Rollator (4 wheels)      Prior Function               Mobility Comments: ambulates with RW ADLs Comments: home aide fixes meals     Hand Dominance   Dominant Hand: Left    Extremity/Trunk Assessment   Upper Extremity Assessment Upper Extremity Assessment: Generalized weakness (3/5 Bilaterally, ROM limited shoulder flexion bilaterally)    Lower Extremity Assessment Lower Extremity Assessment: Generalized weakness;LLE deficits/detail;RLE deficits/detail RLE Deficits / Details: 3+/5 strength for SLR, ROM WNL hip flexion and knee flexion LLE Deficits / Details: 3-/5 strength for SLR, ROM WNL hip flexion and knee flexion LLE Sensation: WNL LLE Coordination: decreased gross motor       Communication   Communication: No difficulties  Cognition Arousal/Alertness: Awake/alert Behavior During Therapy: WFL for tasks assessed/performed Overall Cognitive Status: Impaired/Different from baseline Area of Impairment: Orientation                 Orientation Level: Disoriented to, Time, Situation                      General Comments      Exercises Other Exercises Other Exercises: patient educated on fall risk, and role of PT    Assessment/Plan    PT Assessment Patient needs continued PT services  PT Problem List Decreased strength;Decreased mobility;Decreased coordination;Decreased activity tolerance;Decreased balance;Decreased safety awareness       PT Treatment Interventions DME instruction;Therapeutic activities;Gait training;Therapeutic exercise;Stair training;Balance training;Functional mobility training;Neuromuscular re-education    PT Goals (Current goals can be found in the Care Plan section)  Acute Rehab PT Goals Patient Stated Goal: to get better PT Goal Formulation: With patient Time For Goal Achievement: 05/10/21 Potential to Achieve Goals: Good    Frequency Min 2X/week     Co-evaluation               AM-PAC PT "6 Clicks" Mobility  Outcome Measure Help needed turning from your back to your side while in a flat bed without using bedrails?: None Help needed moving from lying on your back to sitting on the side of a flat bed without using bedrails?: None Help needed moving to and from a bed to a chair (including a wheelchair)?: A Little Help needed standing up from a chair using your arms (e.g., wheelchair or bedside chair)?: A Little Help needed to walk in hospital room?: A Little Help needed climbing 3-5 steps with a railing? : A Lot 6 Click Score: 19    End of Session Equipment Utilized During Treatment: Gait belt Activity Tolerance: Patient tolerated treatment well;No increased pain Patient left: in bed;with call bell/phone within reach;with bed alarm  set Nurse Communication: Mobility status PT Visit Diagnosis: Unsteadiness on feet (R26.81);Muscle weakness (generalized) (M62.81);Difficulty in walking, not elsewhere classified (R26.2)    Time: 1914-7829 PT Time Calculation (min) (ACUTE ONLY): 35 min   Charges:   PT Evaluation $PT Eval Low Complexity: 1 Low PT Treatments $Gait Training: 8-22 mins        Angelica Ran, PT  04/26/21. 11:03 AM

## 2021-04-26 NOTE — TOC Progression Note (Addendum)
Transition of Care [facility]) - Progression Note    Patient Details  Name: Elizabeth Novak MRN: [id number] Date of Birth: 1923/06/19  [facility] CM/SW Contact  Izola Price, RN Phone Number: 04/26/2021, 3:17 PM  Clinical Narrative:  1/21: Son has requested [facility] at Discharge, but PT recommendations were for STR/SNF. Spoke with son, Rosell Khouri, [phone]. Explained therapy recommendations for STR vs. [facility] difference in levels of care. Recommended Medicare.gov website to look at facilities. Did not want [facility]. Permission to start broad bed search and verbal permission to share pertinent information for bed search.  Simmie Davies RN CM   NOTE: Provider notes indicate patient is clear she does not wish to go to a SNF dated 04/26/21. Simmie Davies RN CM        Expected Discharge Plan and Services                                                 Social Determinants of Health (SDOH) Interventions    Readmission Risk Interventions No flowsheet data found.

## 2021-04-27 DIAGNOSIS — I447 Left bundle-branch block, unspecified: Secondary | ICD-10-CM

## 2021-04-27 LAB — GLUCOSE, CAPILLARY
Glucose-Capillary: 133 mg/dL — ABNORMAL HIGH (ref 70–99)
Glucose-Capillary: 172 mg/dL — ABNORMAL HIGH (ref 70–99)
Glucose-Capillary: 255 mg/dL — ABNORMAL HIGH (ref 70–99)
Glucose-Capillary: 275 mg/dL — ABNORMAL HIGH (ref 70–99)
Glucose-Capillary: 301 mg/dL — ABNORMAL HIGH (ref 70–99)

## 2021-04-27 LAB — PROCALCITONIN: Procalcitonin: <0.1 ng/mL

## 2021-04-27 NOTE — Progress Notes (Signed)
PROGRESS NOTE  Morganne Brazzel    DOB: 1924/01/14, 88 y.o.  NWG:[id number]  PCP: Danella Penton, MD   Code Status: Full Code   DOA: 04/25/2021   LOS: 1  Brief Narrative of Current Hospitalization  Elizabeth Novak is a 88 y.o. female with a PMH significant for Type II DM, HTN, HLD, neuropathy, h/o SSS s/p pacemaker, Afib, CAD, HFpEF, h/o CVA with right-sided weakness residual, anxiety/depression. They presented from home to the ED on 04/25/2021 after a fall and being down x1 days. She was found to have altered mental status from hypoglycemia on EMS arrival which corrected quickly with administration of glucose. Of note, she had just started QHS insulin the previous night. In the ED, it was found that they had hypothermia, and normalized glucose. They were treated with close glucose monitoring.  Patient was admitted to medicine service for further workup and management of glucose monitoring as outlined in detail below.  04/27/21 -stable, improved  Assessment & Plan  Principal Problem:   Acute metabolic encephalopathy Active Problems:   S/P placement of cardiac pacemaker   HTN (hypertension)   Diabetes mellitus without complication (HCC)   Chronic diastolic CHF (congestive heart failure) (HCC)   COVID-19 virus infection   Hypoglycemia due to insulin   Hypoglycemia   Hypothermia  Acute metabolic encephalopathy- resolved. Patient is at baseline mentation. She states that she is now ok with a short period of rehab admission but ultimately wants to go home. PT recommending SNF. Was living independently prior to current illness.  - PT/OT evaluation  DM type II   neuropathy   HLD- would not recommend QHS insulin as there is a high risk of hypoglycemia with this dosing. Patient has elevated blood sugars to 410 after lunch so would recommend starting very low long-acting insulin in am with close glucose watching. - semglee 10units am daily - close glucose monitoring - diabetes education.   - continue home glipizide, atorvastatin  HTN   h/o SSS s/p pacemaker   HFpEF   Afib   CAD- chronic, stable - continue home digoxin, metoprolol, imdur  H/o CVA with residual R-sided weakness- chronic, stable - PT/OT  Anxiety/depression- chronic, stable - continue home mirtazapine  DVT prophylaxis: SCDs Start: 04/25/21 1707   Diet:  Diet Orders (From admission, onward)     Start     Ordered   04/25/21 1708  Diet Heart Room service appropriate? Yes; Fluid consistency: Thin  Diet effective now       Question Answer Comment  Room service appropriate? Yes   Fluid consistency: Thin      04/25/21 1708            Subjective 04/27/21    Pt reports feeling well. She has no complaints today. Agrees that she will go to short term rehab. Requests that I call her nephew.  Disposition Plan & Communication  Patient status: Inpatient  Admitted From: Home Disposition: TBD Anticipated discharge date: TBD by SNF availability  Family Communication: nephew on phone Consults, Procedures, Significant Events  Consultants:  none  Procedures/significant events:  None  Antimicrobials:  Anti-infectives (From admission, onward)    None       Objective   Vitals:   04/26/21 0700 04/26/21 1356 04/26/21 2000 04/27/21 0450  BP: (!) 149/61 131/68 108/69 (!) 151/71  Pulse: 63 63 76 68  Resp: 18 16 18 18   Temp: 98.9 F (37.2 C) 97.9 F (36.6 C) 98.3 F (36.8 C) 98.5 F (36.9  C)  TempSrc: Oral Oral    SpO2: 98% 92% 99% 98%  Weight:      Height:        Intake/Output Summary (Last 24 hours) at 04/27/2021 0740 Last data filed at 04/26/2021 2144 Gross per 24 hour  Intake 0 ml  Output 500 ml  Net -500 ml    Filed Weights   04/25/21 1152  Weight: 39.5 kg    Patient BMI: Body mass index is 14.93 kg/m.   Physical Exam:  General: awake, alert, NAD HEENT: atraumatic, clear conjunctiva, anicteric sclera, MMM, hearing grossly normal Respiratory: normal respiratory  effort. Cardiovascular: normal S1/S2, RRR, no JVD, murmurs, quick capillary refill  Gastrointestinal: soft, NT, ND Nervous: A&O x3. no gross focal neurologic deficits, normal speech Extremities: moves all equally, no edema, normal tone Skin: dry, intact, normal temperature, normal color. No rashes, lesions or ulcers on exposed skin Psychiatry: normal mood, congruent affect  Labs   I have personally reviewed following labs and imaging studies Admission on 04/25/2021  Component Date Value Ref Range Status   Glucose-Capillary 04/25/2021 152 (H)  70 - 99 mg/dL Final   Lactic Acid, Venous 04/25/2021 1.4  0.5 - 1.9 mmol/L Final   Sodium 04/25/2021 138  135 - 145 mmol/L Final   Potassium 04/25/2021 3.1 (L)  3.5 - 5.1 mmol/L Final   Chloride 04/25/2021 97 (L)  98 - 111 mmol/L Final   CO2 04/25/2021 31  22 - 32 mmol/L Final   Glucose, Bld 04/25/2021 195 (H)  70 - 99 mg/dL Final   BUN 65/78/4696 32 (H)  8 - 23 mg/dL Final   Creatinine, Ser 04/25/2021 0.78  0.44 - 1.00 mg/dL Final   Calcium 29/52/8413 9.9  8.9 - 10.3 mg/dL Final   Total Protein 24/40/1027 6.3 (L)  6.5 - 8.1 g/dL Final   Albumin 25/36/6440 3.7  3.5 - 5.0 g/dL Final   AST 34/74/2595 26  15 - 41 U/L Final   ALT 04/25/2021 16  0 - 44 U/L Final   Alkaline Phosphatase 04/25/2021 61  38 - 126 U/L Final   Total Bilirubin 04/25/2021 0.7  0.3 - 1.2 mg/dL Final   GFR, Estimated 04/25/2021 >60  >60 mL/min Final   Anion gap 04/25/2021 10  5 - 15 Final   WBC 04/25/2021 8.6  4.0 - 10.5 K/uL Final   RBC 04/25/2021 4.62  3.87 - 5.11 MIL/uL Final   Hemoglobin 04/25/2021 11.9 (L)  12.0 - 15.0 g/dL Final   HCT 63/87/5643 37.7  36.0 - 46.0 % Final   MCV 04/25/2021 81.6  80.0 - 100.0 fL Final   MCH 04/25/2021 25.8 (L)  26.0 - 34.0 pg Final   MCHC 04/25/2021 31.6  30.0 - 36.0 g/dL Final   RDW 32/95/1884 18.1 (H)  11.5 - 15.5 % Final   Platelets 04/25/2021 247  150 - 400 K/uL Final   nRBC 04/25/2021 0.0  0.0 - 0.2 % Final   Neutrophils Relative  % 04/25/2021 88  % Final   Neutro Abs 04/25/2021 7.5  1.7 - 7.7 K/uL Final   Lymphocytes Relative 04/25/2021 7  % Final   Lymphs Abs 04/25/2021 0.6 (L)  0.7 - 4.0 K/uL Final   Monocytes Relative 04/25/2021 5  % Final   Monocytes Absolute 04/25/2021 0.5  0.1 - 1.0 K/uL Final   Eosinophils Relative 04/25/2021 0  % Final   Eosinophils Absolute 04/25/2021 0.0  0.0 - 0.5 K/uL Final   Basophils Relative 04/25/2021 0  %  Final   Basophils Absolute 04/25/2021 0.0  0.0 - 0.1 K/uL Final   Immature Granulocytes 04/25/2021 0  % Final   Abs Immature Granulocytes 04/25/2021 0.03  0.00 - 0.07 K/uL Final   Prothrombin Time 04/25/2021 13.4  11.4 - 15.2 seconds Final   INR 04/25/2021 1.0  0.8 - 1.2 Final   aPTT 04/25/2021 33  24 - 36 seconds Final   Specimen Description 04/25/2021 BLOOD RIGHT ANTECUBITAL   Final   Special Requests 04/25/2021 BOTTLES DRAWN AEROBIC AND ANAEROBIC Blood Culture results may not be optimal due to an excessive volume of blood received in culture bottles   Final   Culture  Setup Time 04/25/2021    Final                   Value:AEROBIC BOTTLE ONLY GRAM POSITIVE COCCI Organism ID to follow CRITICAL RESULT CALLED TO, READ BACK BY AND VERIFIED WITH: JASON ROBBINS AT 0702 04/26/21.PMF Performed at [facility], [address] [place], [place] [address]    Culture 04/25/2021 [facility] POSITIVE COCCI   Final   Report Status 04/25/2021 PENDING   Incomplete   Specimen Description 04/25/2021 BLOOD BLOOD LEFT FOREARM   Final   Special Requests 04/25/2021 BOTTLES DRAWN AEROBIC AND ANAEROBIC Blood Culture adequate volume   Final   Culture 04/25/2021    Final                   Value:NO GROWTH 2 DAYS Performed at [facility], [address] [place], [place] [address]    Report Status 04/25/2021 PENDING   Incomplete   SARS Coronavirus 2 by RT PCR 04/25/2021 POSITIVE (A)  NEGATIVE Final   Influenza A by PCR 04/25/2021 NEGATIVE  NEGATIVE Final   Influenza B by PCR 04/25/2021  NEGATIVE  NEGATIVE Final   Procalcitonin 04/25/2021 <0.10  ng/mL Final   Troponin I (High Sensitivity) 04/25/2021 13  <18 ng/L Final   Troponin I (High Sensitivity) 04/25/2021 20 (H)  <18 ng/L Final   Procalcitonin 04/26/2021 <0.10  ng/mL Final   Sodium 04/26/2021 140  135 - 145 mmol/L Final   Potassium 04/26/2021 4.2  3.5 - 5.1 mmol/L Final   Chloride 04/26/2021 99  98 - 111 mmol/L Final   CO2 04/26/2021 31  22 - 32 mmol/L Final   Glucose, Bld 04/26/2021 167 (H)  70 - 99 mg/dL Final   BUN 86/76/1950 29 (H)  8 - 23 mg/dL Final   Creatinine, Ser 04/26/2021 0.80  0.44 - 1.00 mg/dL Final   Calcium 93/26/7124 9.7  8.9 - 10.3 mg/dL Final   GFR, Estimated 04/26/2021 >60  >60 mL/min Final   Anion gap 04/26/2021 10  5 - 15 Final   WBC 04/26/2021 6.4  4.0 - 10.5 K/uL Final   RBC 04/26/2021 4.28  3.87 - 5.11 MIL/uL Final   Hemoglobin 04/26/2021 10.9 (L)  12.0 - 15.0 g/dL Final   HCT 58/12/9831 34.9 (L)  36.0 - 46.0 % Final   MCV 04/26/2021 81.5  80.0 - 100.0 fL Final   MCH 04/26/2021 25.5 (L)  26.0 - 34.0 pg Final   MCHC 04/26/2021 31.2  30.0 - 36.0 g/dL Final   RDW 82/50/5397 18.6 (H)  11.5 - 15.5 % Final   Platelets 04/26/2021 255  150 - 400 K/uL Final   nRBC 04/26/2021 0.0  0.0 - 0.2 % Final   Glucose, Bld 04/25/2021 189 (H)  70 - 99 mg/dL Final   Glucose, Bld 67/34/1937  355 (H)  70 - 99 mg/dL Final   Magnesium 16/01/9603 2.5 (H)  1.7 - 2.4 mg/dL Final   Glucose-Capillary 04/25/2021 196 (H)  70 - 99 mg/dL Final   Enterococcus faecalis 04/25/2021 NOT DETECTED  NOT DETECTED Final   Enterococcus Faecium 04/25/2021 NOT DETECTED  NOT DETECTED Final   Listeria monocytogenes 04/25/2021 NOT DETECTED  NOT DETECTED Final   Staphylococcus species 04/25/2021 NOT DETECTED  NOT DETECTED Final   Staphylococcus aureus (BCID) 04/25/2021 NOT DETECTED  NOT DETECTED Final   Staphylococcus epidermidis 04/25/2021 NOT DETECTED  NOT DETECTED Final   Staphylococcus lugdunensis 04/25/2021 NOT DETECTED  NOT  DETECTED Final   Streptococcus species 04/25/2021 DETECTED (A)  NOT DETECTED Final   Streptococcus agalactiae 04/25/2021 NOT DETECTED  NOT DETECTED Final   Streptococcus pneumoniae 04/25/2021 NOT DETECTED  NOT DETECTED Final   Streptococcus pyogenes 04/25/2021 NOT DETECTED  NOT DETECTED Final   A.calcoaceticus-baumannii 04/25/2021 NOT DETECTED  NOT DETECTED Final   Bacteroides fragilis 04/25/2021 NOT DETECTED  NOT DETECTED Final   Enterobacterales 04/25/2021 NOT DETECTED  NOT DETECTED Final   Enterobacter cloacae complex 04/25/2021 NOT DETECTED  NOT DETECTED Final   Escherichia coli 04/25/2021 NOT DETECTED  NOT DETECTED Final   Klebsiella aerogenes 04/25/2021 NOT DETECTED  NOT DETECTED Final   Klebsiella oxytoca 04/25/2021 NOT DETECTED  NOT DETECTED Final   Klebsiella pneumoniae 04/25/2021 NOT DETECTED  NOT DETECTED Final   Proteus species 04/25/2021 NOT DETECTED  NOT DETECTED Final   Salmonella species 04/25/2021 NOT DETECTED  NOT DETECTED Final   Serratia marcescens 04/25/2021 NOT DETECTED  NOT DETECTED Final   Haemophilus influenzae 04/25/2021 NOT DETECTED  NOT DETECTED Final   Neisseria meningitidis 04/25/2021 NOT DETECTED  NOT DETECTED Final   Pseudomonas aeruginosa 04/25/2021 NOT DETECTED  NOT DETECTED Final   Stenotrophomonas maltophilia 04/25/2021 NOT DETECTED  NOT DETECTED Final   Candida albicans 04/25/2021 NOT DETECTED  NOT DETECTED Final   Candida auris 04/25/2021 NOT DETECTED  NOT DETECTED Final   Candida glabrata 04/25/2021 NOT DETECTED  NOT DETECTED Final   Candida krusei 04/25/2021 NOT DETECTED  NOT DETECTED Final   Candida parapsilosis 04/25/2021 NOT DETECTED  NOT DETECTED Final   Candida tropicalis 04/25/2021 NOT DETECTED  NOT DETECTED Final   Cryptococcus neoformans/gattii 04/25/2021 NOT DETECTED  NOT DETECTED Final   Glucose-Capillary 04/26/2021 152 (H)  70 - 99 mg/dL Final   Glucose-Capillary 04/26/2021 152 (H)  70 - 99 mg/dL Final   Glucose-Capillary 04/26/2021 410  (H)  70 - 99 mg/dL Final   Glucose-Capillary 04/26/2021 111 (H)  70 - 99 mg/dL Final   Glucose-Capillary 04/26/2021 115 (H)  70 - 99 mg/dL Final   Glucose-Capillary 04/26/2021 172 (H)  70 - 99 mg/dL Final    Imaging Studies  CT HEAD WO CONTRAST ( )  Result Date: 04/25/2021 CLINICAL DATA:  Mental status change EXAM: CT HEAD WITHOUT CONTRAST TECHNIQUE: Contiguous axial images were obtained from the base of the skull through the vertex without intravenous contrast. RADIATION DOSE REDUCTION: This exam was performed according to the departmental dose-optimization program which includes automated exposure control, adjustment of the mA and/or kV according to patient size and/or use of iterative reconstruction technique. COMPARISON:  Head CT dated January 06, 2021 FINDINGS: Brain: Chronic white matter ischemic change. No evidence of acute infarction, hemorrhage, hydrocephalus, extra-axial collection or mass lesion/mass effect. Vascular: No hyperdense vessel or unexpected calcification. Skull: Normal. Negative for fracture or focal lesion. Sinuses/Orbits: No acute finding. Other: None. IMPRESSION: No acute intracranial  abnormality Electronically Signed   By: Allegra Lai M.D.   On: 04/25/2021 14:05   DG Chest Port 1 View  Result Date: 04/25/2021 CLINICAL DATA:  Evaluate for abnormality.  Questionable sepsis. EXAM: PORTABLE CHEST 1 VIEW COMPARISON:  04/05/2021 FINDINGS: There is a left chest wall pacer device with lead in the right ventricle. Cardiac enlargement is unchanged from previous exam. No pleural effusion or edema. No airspace opacities. IMPRESSION: No acute cardiopulmonary abnormalities. Electronically Signed   By: Signa Kell M.D.   On: 04/25/2021 12:54    Medications   Scheduled Meds:  atorvastatin  20 mg Oral QHS   digoxin  0.125 mg Oral QODAY   famotidine  20 mg Oral QHS   glipiZIDE  10 mg Oral Q breakfast   insulin glargine-yfgn  10 Units Subcutaneous Daily   isosorbide mononitrate   30 mg Oral Daily   metoprolol succinate  50 mg Oral Daily   mirtazapine  7.5 mg Oral QHS   sodium chloride flush  3 mL Intravenous Q12H   No recently discontinued medications to reconcile  LOS: 1 day   Leeroy Bock, DO [facility] 04/27/2021, 7:40 AM   Available by Epic secure chat 7AM-7PM. If 7PM-7AM, please contact night-coverage Refer to [email or web address] to contact the [facility] Attending or Consulting provider for this pt
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
## 2021-04-27 NOTE — TOC Progression Note (Signed)
Transition of Care [facility]) - Progression Note    Patient Details  Name: Elizabeth Novak MRN: [id number] Date of Birth: [birth year]  Transition of [facility]) CM/SW Contact  Izola Price, RN Phone Number: 04/27/2021, 3:29 PM  Clinical Narrative:   1/22: Per provider patient has agreed to SNF. Previous PASRR #[id number]. Broad bed Search initiated based on conversation with son yesterday. Does not want [facility] per son. Son lives in [place] but wants patient to stay in the area. TOC will continue to follow for SNF placement. Simmie Davies RN CM          Expected Discharge Plan and Services                                                 Social Determinants of Health (SDOH) Interventions    Readmission Risk Interventions No flowsheet data found.

## 2021-04-28 DIAGNOSIS — S2241XA Multiple fractures of ribs, right side, initial encounter for closed fracture: Secondary | ICD-10-CM

## 2021-04-28 LAB — CULTURE, BLOOD (ROUTINE X 2)

## 2021-04-28 LAB — GLUCOSE, CAPILLARY
Glucose-Capillary: 106 mg/dL — ABNORMAL HIGH (ref 70–99)
Glucose-Capillary: 127 mg/dL — ABNORMAL HIGH (ref 70–99)
Glucose-Capillary: 245 mg/dL — ABNORMAL HIGH (ref 70–99)
Glucose-Capillary: 329 mg/dL — ABNORMAL HIGH (ref 70–99)
Glucose-Capillary: 263 mg/dL — ABNORMAL HIGH (ref 70–99)

## 2021-04-28 MED ORDER — POLYETHYLENE GLYCOL 3350 17 G PO PACK
17.0000 g | PACK | Freq: Two times a day (BID) | ORAL | Status: DC
  Administered 2021-04-28 – 2021-04-29 (×3): 17 g via ORAL
  Filled 2021-04-28 (×3): qty 1

## 2021-04-28 MED ORDER — ACETAMINOPHEN 325 MG PO TABS
650.0000 mg | ORAL_TABLET | Freq: Four times a day (QID) | ORAL | Status: DC | PRN
  Administered 2021-04-28 (×2): 650 mg via ORAL
  Filled 2021-04-28 (×2): qty 2

## 2021-04-28 MED ORDER — DICLOFENAC SODIUM 1 % EX GEL
2.0000 g | Freq: Four times a day (QID) | CUTANEOUS | Status: DC
  Administered 2021-04-28 – 2021-04-29 (×6): 2 g via TOPICAL
  Filled 2021-04-28: qty 100

## 2021-04-28 NOTE — TOC Progression Note (Signed)
Transition of Care [facility]) - Progression Note    Patient Details  Name: Elizabeth Novak MRN: [id number] Date of Birth: 02/26/1924  [facility]) CM/SW Contact  Beverly Sessions, RN Phone Number: 04/28/2021, 2:07 PM  Clinical Narrative:    Bed offers presented to patient and son.  Patient states that she wishes to discuss with her family and will give me her decision today         Expected Discharge Plan and Services                                                 Social Determinants of Health (SDOH) Interventions    Readmission Risk Interventions No flowsheet data found.

## 2021-04-28 NOTE — Progress Notes (Addendum)
PROGRESS NOTE  Elizabeth Novak    DOB: 09-25-1923, 88 y.o.  WUJ:[id number]  PCP: Danella Penton, MD   Code Status: Full Code   DOA: 04/25/2021   LOS: 2  Brief Narrative of Current Hospitalization  Elizabeth Novak is a 88 y.o. female with a PMH significant for Type II DM, HTN, HLD, neuropathy, h/o SSS s/p pacemaker, Afib, CAD, HFpEF, h/o CVA with right-sided weakness residual, anxiety/depression. They presented from home to the ED on 04/25/2021 after a fall and being down x1 days. She was found to have altered mental status from hypoglycemia on EMS arrival which corrected quickly with administration of glucose. Of note, she had just started QHS insulin the previous night. In the ED, it was found that they had hypothermia, and normalized glucose. They were treated with close glucose monitoring.  Patient was admitted to medicine service for further workup and management of glucose monitoring as outlined in detail below.  04/28/21 -stable, improved  Assessment & Plan  Principal Problem:   Acute metabolic encephalopathy Active Problems:   S/P placement of cardiac pacemaker   HTN (hypertension)   Diabetes mellitus without complication (HCC)   Chronic diastolic CHF (congestive heart failure) (HCC)   COVID-19 virus infection   Hypoglycemia due to insulin   Hypoglycemia   Hypothermia   LBBB (left bundle branch block)  Acute metabolic encephalopathy- resolved. Patient is at baseline mentation. She states that she is now ok with a short period of rehab admission but ultimately wants to go home. PT recommending SNF. Was living independently prior to current illness.  - PT/OT evaluation  R-sided multiple posterior rib fracture- POA. patient was complaining or right thoracic back pain this morning present since her fall at home. The initial chest xray did not comment on osseous abnormalities. On exam- she has tenderness to palpation to right thoracic area but no obvious deformity or skin  changes. I personally reviewed her cxray images from admission and see likely R rib fracture of T3, 6, 7 and maybe more with some obscurity given osteoporotic changes. Called and discussed with radiologist on call to confirm and Dr Dorothey Baseman agreed but unable to determine chronicity.  - incentive spirometry to prevent atelectasis  - k-pad - voltaren gel - analgesia PRN   DM type II   neuropathy   HLD- would not recommend QHS insulin as there is a high risk of hypoglycemia with this dosing. Avoid tight glycemic control as she is at high risk of hypoglycemia.  - semglee 10units am daily (ONLY MORNING ADMINISTRATION SO SHE CAN EAT THROUGHOUT DAY AND MONITOR FOR SYMPTOMATIC HYPOGLYCEMIA) - close glucose monitoring - diabetes education.  - continue home glipizide, atorvastatin  HTN   h/o SSS s/p pacemaker   HFpEF   Afib   CAD- chronic, stable - continue home digoxin, metoprolol, imdur  H/o CVA with residual R-sided weakness- chronic, stable - PT/OT  Anxiety/depression- chronic, stable - continue home mirtazapine  DVT prophylaxis: SCDs Start: 04/25/21 1707  Diet:  Diet Orders (From admission, onward)     Start     Ordered   04/25/21 1708  Diet Heart Room service appropriate? Yes; Fluid consistency: Thin  Diet effective now       Question Answer Comment  Room service appropriate? Yes   Fluid consistency: Thin      04/25/21 1708            Subjective 04/28/21    Pt reports feeling well overall. She states that she has  right back pain worse with moving since her fall at home. Also hurts when she coughs.  Disposition Plan & Communication  Patient status: Inpatient  Admitted From: Home Disposition: TBD Anticipated discharge date: TBD by SNF availability  Family Communication: nephew on phone Consults, Procedures, Significant Events  Consultants:  none  Procedures/significant events:  None  Antimicrobials:  Anti-infectives (From admission, onward)    None        Objective   Vitals:   04/27/21 0758 04/27/21 1553 04/27/21 2005 04/28/21 0527  BP: (!) 153/67 119/64 (!) 158/61 (!) 127/58  Pulse: 83 74 63 67  Resp: 18 17 15 20   Temp: 97.8 F (36.6 C) 98.2 F (36.8 C) 98.5 F (36.9 C) 97.9 F (36.6 C)  TempSrc:  Oral    SpO2: 99% 100% 100% 98%  Weight:      Height:       No intake or output data in the 24 hours ending 04/28/21 0727  Filed Weights   04/25/21 1152  Weight: 39.5 kg    Patient BMI: Body mass index is 14.93 kg/m.   Physical Exam:  General: awake, alert, NAD HEENT: atraumatic, clear conjunctiva, anicteric sclera, MMM, hearing grossly normal Respiratory: normal respiratory effort. Back: positive for acute tenderness to palpation of right posterior ribs. No deformity or skin changes Cardiovascular: normal S1/S2, RRR, no JVD, murmurs, quick capillary refill  Nervous: A&O x3. no gross focal neurologic deficits, normal speech Extremities: moves all equally, no edema, normal tone Skin: dry, intact, normal temperature, normal color. No rashes, lesions or ulcers on exposed skin Psychiatry: normal mood, congruent affect  Labs   I have personally reviewed following labs and imaging studies Admission on 04/25/2021  Component Date Value Ref Range Status   Glucose-Capillary 04/25/2021 152 (H)  70 - 99 mg/dL Final   Lactic Acid, Venous 04/25/2021 1.4  0.5 - 1.9 mmol/L Final   Sodium 04/25/2021 138  135 - 145 mmol/L Final   Potassium 04/25/2021 3.1 (L)  3.5 - 5.1 mmol/L Final   Chloride 04/25/2021 97 (L)  98 - 111 mmol/L Final   CO2 04/25/2021 31  22 - 32 mmol/L Final   Glucose, Bld 04/25/2021 195 (H)  70 - 99 mg/dL Final   BUN 40/98/1191 32 (H)  8 - 23 mg/dL Final   Creatinine, Ser 04/25/2021 0.78  0.44 - 1.00 mg/dL Final   Calcium 47/82/9562 9.9  8.9 - 10.3 mg/dL Final   Total Protein 13/11/6576 6.3 (L)  6.5 - 8.1 g/dL Final   Albumin 46/96/2952 3.7  3.5 - 5.0 g/dL Final   AST 84/13/2440 26  15 - 41 U/L Final   ALT 04/25/2021 16   0 - 44 U/L Final   Alkaline Phosphatase 04/25/2021 61  38 - 126 U/L Final   Total Bilirubin 04/25/2021 0.7  0.3 - 1.2 mg/dL Final   GFR, Estimated 04/25/2021 >60  >60 mL/min Final   Anion gap 04/25/2021 10  5 - 15 Final   WBC 04/25/2021 8.6  4.0 - 10.5 K/uL Final   RBC 04/25/2021 4.62  3.87 - 5.11 MIL/uL Final   Hemoglobin 04/25/2021 11.9 (L)  12.0 - 15.0 g/dL Final   HCT 01/31/2535 37.7  36.0 - 46.0 % Final   MCV 04/25/2021 81.6  80.0 - 100.0 fL Final   MCH 04/25/2021 25.8 (L)  26.0 - 34.0 pg Final   MCHC 04/25/2021 31.6  30.0 - 36.0 g/dL Final   RDW 64/40/3474 18.1 (H)  11.5 - 15.5 %  Final   Platelets 04/25/2021 247  150 - 400 K/uL Final   nRBC 04/25/2021 0.0  0.0 - 0.2 % Final   Neutrophils Relative % 04/25/2021 88  % Final   Neutro Abs 04/25/2021 7.5  1.7 - 7.7 K/uL Final   Lymphocytes Relative 04/25/2021 7  % Final   Lymphs Abs 04/25/2021 0.6 (L)  0.7 - 4.0 K/uL Final   Monocytes Relative 04/25/2021 5  % Final   Monocytes Absolute 04/25/2021 0.5  0.1 - 1.0 K/uL Final   Eosinophils Relative 04/25/2021 0  % Final   Eosinophils Absolute 04/25/2021 0.0  0.0 - 0.5 K/uL Final   Basophils Relative 04/25/2021 0  % Final   Basophils Absolute 04/25/2021 0.0  0.0 - 0.1 K/uL Final   Immature Granulocytes 04/25/2021 0  % Final   Abs Immature Granulocytes 04/25/2021 0.03  0.00 - 0.07 K/uL Final   Prothrombin Time 04/25/2021 13.4  11.4 - 15.2 seconds Final   INR 04/25/2021 1.0  0.8 - 1.2 Final   aPTT 04/25/2021 33  24 - 36 seconds Final   Specimen Description 04/25/2021    Final                   Value:BLOOD RIGHT ANTECUBITAL Performed at [facility], [address] [place], [place] [address]    Special Requests 04/25/2021    Final                   Value:BOTTLES DRAWN AEROBIC AND ANAEROBIC Blood Culture results may not be optimal due to an excessive volume of blood received in culture bottles Performed at [facility], [address] [place], [place] [address]     Culture  Setup Time 04/25/2021    Final                   Value:AEROBIC BOTTLE ONLY GRAM POSITIVE COCCI Organism ID to follow CRITICAL RESULT CALLED TO, READ BACK BY AND VERIFIED WITH: JASON ROBBINS AT 0702 04/26/21.PMF Performed at [facility], [address] [place], [place] [address]    Culture 04/25/2021    Final                   Value:GRAM POSITIVE COCCI IDENTIFICATION TO FOLLOW Performed at [facility], [address], [place], [place] [address]    Report Status 04/25/2021 PENDING   Incomplete   Specimen Description 04/25/2021 BLOOD BLOOD LEFT FOREARM   Final   Special Requests 04/25/2021 BOTTLES DRAWN AEROBIC AND ANAEROBIC Blood Culture adequate volume   Final   Culture 04/25/2021    Final                   Value:NO GROWTH 2 DAYS Performed at [facility], [address] [place], [place] [address]    Report Status 04/25/2021 PENDING   Incomplete   SARS Coronavirus 2 by RT PCR 04/25/2021 POSITIVE (A)  NEGATIVE Final   Influenza A by PCR 04/25/2021 NEGATIVE  NEGATIVE Final   Influenza B by PCR 04/25/2021 NEGATIVE  NEGATIVE Final   Procalcitonin 04/25/2021 <0.10  ng/mL Final   Troponin I (High Sensitivity) 04/25/2021 13  <18 ng/L Final   Troponin I (High Sensitivity) 04/25/2021 20 (H)  <18 ng/L Final   Procalcitonin 04/26/2021 <0.10  ng/mL Final   Sodium 04/26/2021 140  135 - 145 mmol/L Final   Potassium 04/26/2021 4.2  3.5 - 5.1 mmol/L Final   Chloride 04/26/2021 99  98 - 111 mmol/L Final   CO2 04/26/2021 31  22 - 32 mmol/L Final   Glucose, Bld 04/26/2021 167 (H)  70 - 99 mg/dL Final   BUN 32/95/1884 29 (H)  8 - 23 mg/dL Final   Creatinine, Ser 04/26/2021 0.80  0.44 - 1.00 mg/dL Final   Calcium 16/60/6301 9.7  8.9 - 10.3 mg/dL Final   GFR, Estimated 04/26/2021 >60  >60 mL/min Final   Anion gap 04/26/2021 10  5 - 15 Final   WBC 04/26/2021 6.4  4.0 - 10.5 K/uL Final   RBC 04/26/2021 4.28  3.87 - 5.11 MIL/uL Final   Hemoglobin 04/26/2021 10.9 (L)   12.0 - 15.0 g/dL Final   HCT 60/01/9322 34.9 (L)  36.0 - 46.0 % Final   MCV 04/26/2021 81.5  80.0 - 100.0 fL Final   MCH 04/26/2021 25.5 (L)  26.0 - 34.0 pg Final   MCHC 04/26/2021 31.2  30.0 - 36.0 g/dL Final   RDW 55/73/2202 18.6 (H)  11.5 - 15.5 % Final   Platelets 04/26/2021 255  150 - 400 K/uL Final   nRBC 04/26/2021 0.0  0.0 - 0.2 % Final   Glucose, Bld 04/25/2021 189 (H)  70 - 99 mg/dL Final   Glucose, Bld 54/27/0623 355 (H)  70 - 99 mg/dL Final   Magnesium 76/28/3151 2.5 (H)  1.7 - 2.4 mg/dL Final   Glucose-Capillary 04/25/2021 196 (H)  70 - 99 mg/dL Final   Enterococcus faecalis 04/25/2021 NOT DETECTED  NOT DETECTED Final   Enterococcus Faecium 04/25/2021 NOT DETECTED  NOT DETECTED Final   Listeria monocytogenes 04/25/2021 NOT DETECTED  NOT DETECTED Final   Staphylococcus species 04/25/2021 NOT DETECTED  NOT DETECTED Final   Staphylococcus aureus (BCID) 04/25/2021 NOT DETECTED  NOT DETECTED Final   Staphylococcus epidermidis 04/25/2021 NOT DETECTED  NOT DETECTED Final   Staphylococcus lugdunensis 04/25/2021 NOT DETECTED  NOT DETECTED Final   Streptococcus species 04/25/2021 DETECTED (A)  NOT DETECTED Final   Streptococcus agalactiae 04/25/2021 NOT DETECTED  NOT DETECTED Final   Streptococcus pneumoniae 04/25/2021 NOT DETECTED  NOT DETECTED Final   Streptococcus pyogenes 04/25/2021 NOT DETECTED  NOT DETECTED Final   A.calcoaceticus-baumannii 04/25/2021 NOT DETECTED  NOT DETECTED Final   Bacteroides fragilis 04/25/2021 NOT DETECTED  NOT DETECTED Final   Enterobacterales 04/25/2021 NOT DETECTED  NOT DETECTED Final   Enterobacter cloacae complex 04/25/2021 NOT DETECTED  NOT DETECTED Final   Escherichia coli 04/25/2021 NOT DETECTED  NOT DETECTED Final   Klebsiella aerogenes 04/25/2021 NOT DETECTED  NOT DETECTED Final   Klebsiella oxytoca 04/25/2021 NOT DETECTED  NOT DETECTED Final   Klebsiella pneumoniae 04/25/2021 NOT DETECTED  NOT DETECTED Final   Proteus species 04/25/2021 NOT  DETECTED  NOT DETECTED Final   Salmonella species 04/25/2021 NOT DETECTED  NOT DETECTED Final   Serratia marcescens 04/25/2021 NOT DETECTED  NOT DETECTED Final   Haemophilus influenzae 04/25/2021 NOT DETECTED  NOT DETECTED Final   Neisseria meningitidis 04/25/2021 NOT DETECTED  NOT DETECTED Final   Pseudomonas aeruginosa 04/25/2021 NOT DETECTED  NOT DETECTED Final   Stenotrophomonas maltophilia 04/25/2021 NOT DETECTED  NOT DETECTED Final   Candida albicans 04/25/2021 NOT DETECTED  NOT DETECTED Final   Candida auris 04/25/2021 NOT DETECTED  NOT DETECTED Final   Candida glabrata 04/25/2021 NOT DETECTED  NOT DETECTED Final   Candida krusei 04/25/2021 NOT DETECTED  NOT DETECTED Final   Candida parapsilosis 04/25/2021 NOT DETECTED  NOT DETECTED Final   Candida tropicalis 04/25/2021 NOT DETECTED  NOT DETECTED Final   Cryptococcus neoformans/gattii 04/25/2021 NOT DETECTED  NOT DETECTED Final   Glucose-Capillary 04/26/2021 152 (H)  70 - 99 mg/dL Final   Glucose-Capillary 04/26/2021 152 (H)  70 - 99 mg/dL Final   Glucose-Capillary 04/26/2021 410 (H)  70 - 99 mg/dL Final   Procalcitonin 30/86/5784 <0.10  ng/mL Final   Glucose-Capillary 04/26/2021 111 (H)  70 - 99 mg/dL Final   Glucose-Capillary 04/26/2021 115 (H)  70 - 99 mg/dL Final   Glucose-Capillary 04/26/2021 172 (H)  70 - 99 mg/dL Final   Glucose-Capillary 04/27/2021 133 (H)  70 - 99 mg/dL Final   Glucose-Capillary 04/27/2021 301 (H)  70 - 99 mg/dL Final   Glucose-Capillary 04/27/2021 275 (H)  70 - 99 mg/dL Final   Glucose-Capillary 04/27/2021 255 (H)  70 - 99 mg/dL Final   Glucose-Capillary 04/27/2021 172 (H)  70 - 99 mg/dL Final   Glucose-Capillary 04/28/2021 106 (H)  70 - 99 mg/dL Final    Imaging Studies  No results found.  Medications   Scheduled Meds:  atorvastatin  20 mg Oral QHS   digoxin  0.125 mg Oral QODAY   famotidine  20 mg Oral QHS   glipiZIDE  10 mg Oral Q breakfast   insulin glargine-yfgn  10 Units Subcutaneous Daily    isosorbide mononitrate  30 mg Oral Daily   metoprolol succinate  50 mg Oral Daily   mirtazapine  7.5 mg Oral QHS   sodium chloride flush  3 mL Intravenous Q12H   No recently discontinued medications to reconcile  LOS: 2 days   Leeroy Bock, DO [facility] 04/28/2021, 7:27 AM   Available by Epic secure chat 7AM-7PM. If 7PM-7AM, please contact night-coverage Refer to [email or web address] to contact the [facility] Attending or Consulting provider for this pt

## 2021-04-28 NOTE — TOC Progression Note (Signed)
Transition of Care [facility]) - Progression Note    Patient Details  Name: Elizabeth Novak MRN: [id number] Date of Birth: 12-Nov-1923  Transition of Care [facility]) CM/SW Contact  Beverly Sessions, RN Phone Number: 04/28/2021, 4:04 PM  Clinical Narrative:     Patient and son would like to accept bed at [facility].  Accepted in hub and VM left for Ricky at [facility].        Expected Discharge Plan and Services                                                 Social Determinants of Health (SDOH) Interventions    Readmission Risk Interventions No flowsheet data found.

## 2021-04-29 LAB — URINALYSIS, COMPLETE (UACMP) WITH MICROSCOPIC
Bilirubin Urine: NEGATIVE
Glucose, UA: NEGATIVE mg/dL
Hgb urine dipstick: NEGATIVE
Ketones, ur: NEGATIVE mg/dL
Nitrite: NEGATIVE
Protein, ur: NEGATIVE mg/dL
Specific Gravity, Urine: 1.01 (ref 1.005–1.030)
pH: 5.5 (ref 5.0–8.0)

## 2021-04-29 LAB — GLUCOSE, CAPILLARY
Glucose-Capillary: 133 mg/dL — ABNORMAL HIGH (ref 70–99)
Glucose-Capillary: 170 mg/dL — ABNORMAL HIGH (ref 70–99)
Glucose-Capillary: 59 mg/dL — ABNORMAL LOW (ref 70–99)
Glucose-Capillary: 78 mg/dL (ref 70–99)
Glucose-Capillary: 80 mg/dL (ref 70–99)

## 2021-04-29 MED ORDER — INSULIN GLARGINE-YFGN 100 UNIT/ML ~~LOC~~ SOLN: 10.0000 [IU] | Freq: Every day | SUBCUTANEOUS | 11 refills | Status: DC

## 2021-04-29 MED ORDER — DICLOFENAC SODIUM 1 % EX GEL: 2.0000 g | Freq: Four times a day (QID) | CUTANEOUS | Status: AC

## 2021-04-29 MED ORDER — POLYETHYLENE GLYCOL 3350 17 G PO PACK: 17.0000 g | PACK | Freq: Every day | ORAL | 0 refills | Status: DC

## 2021-04-29 MED ORDER — ACETAMINOPHEN 325 MG PO TABS: 650.0000 mg | ORAL_TABLET | Freq: Four times a day (QID) | ORAL | Status: AC | PRN

## 2021-04-29 NOTE — Progress Notes (Signed)
Inpatient Diabetes Program Recommendations  AACE/ADA: New Consensus Statement on Inpatient Glycemic Control   Target Ranges:  Prepandial:   less than 140 mg/dL      Peak postprandial:   less than 180 mg/dL (1-2 hours)      Critically ill patients:  140 - 180 mg/dL    Latest Reference Range & Units 04/29/21 00:08 04/29/21 04:30 04/29/21 08:43 04/29/21 09:13  Glucose-Capillary 70 - 99 mg/dL 133 (H) 78 59 (L) 80    Latest Reference Range & Units 04/28/21 05:19 04/28/21 08:33 04/28/21 11:34 04/28/21 16:19 04/28/21 19:50  Glucose-Capillary 70 - 99 mg/dL 106 (H) 127 (H) 245 (H) 329 (H) 263 (H)   Review of Glycemic Control  Diabetes history: DM2 Outpatient Diabetes medications: Glipizide XL 10 mg daily, Lantus 10  units QHS Current orders for Inpatient glycemic control: Semglee 10 units daily, Glipizide XL 10 mg daily  Inpatient Diabetes Program Recommendations:    Hypoglycemia: Glucose 59 mg/dl today at 8:43 am. Patient already given Semglee 10 units and Glipizide XL 10 mg this morning. If patient has any further issues with hypoglycemia, may want to consider discontinuing Glipizide XL and using Novolog 3-4 units TID with meals for meal coverage if patient eats at least 50% of meals.  Thanks, Barnie Alderman, RN, MSN, CDE Diabetes Coordinator Inpatient Diabetes Program [phone] (Team Pager from 8am to 5pm)

## 2021-04-29 NOTE — Progress Notes (Signed)
PT Cancellation Note  Patient Details Name: Elizabeth Novak MRN: [id number] DOB: December 27, 1923   Cancelled Treatment:    Reason Eval/Treat Not Completed: Patient declined, no reason specified. Pt received in bed, home health caregiver in room with her. Pt declining therapy stating she does not feel well and that it hurts to move due to rib fractures. She also mentioned pain with coughing - PT suggested bracing ribs with a pillow during coughing/sneezing/laughing. Pt grateful for suggestion. Plan to d/c today - if pt remains acute, PT will continue to follow and treat at pt is willing.    Patrina Levering PT, DPT 04/29/21 1:58 PM [phone]

## 2021-04-29 NOTE — Discharge Summary (Signed)
Physician Discharge Summary  Camerina Acey VOZ:[id number] DOB: Oct 19, 1923 DOA: 04/25/2021  PCP: Danella Penton, MD  Admit date: 04/25/2021 Discharge date: 04/29/2021  Admitted From: Home Disposition: Skilled nursing facility  Recommendations for Outpatient Follow-up:  Follow up with PCP within 1-2 weeks to monitor blood sugars and assess need for continued blood sugar management. I would recommend that her insulin administration take place ONLY in am as she has very labile blood sugars and with a low dose of long-acting insulin, she tolerated well as long as she is awake to notice symptoms of hypoglycemia and can eat food. On discharge, patient's medications were reconciled and discontinued several non-necessary medications to limit pill burden. Can be restarted by patient/PCP preference. Patient sustained several right sided rib fractures in fall prior to admission. Recommend incentive spirometer and topical pain management. Follow up on pain management and atelectasis prevention.  Discharge Condition:stable CODE STATUS:  Code Status: Full Code  Regular healthy diet  Brief/Interim Summary: Pt presented to ED after sustaining a fall at home and was found down after several hours. It was discovered that patient had started Lantus QHS the evening prior which led to symptomatic hypoglycemia with initial glucose of 70, per EMS.  Patient was alert on presentation with some mild confusion. Initial glucose was 195 after receiving d50 ampule in transport to hospital.  Her blood glucoses were monitored carefully throughout admission and she had labile levels but ultimately remained stable with 10 units long-acting insulin am dosing. She was also continued on her home glipizide.   Additionally- patient sustained multiple R rib fractures from her fall and had discomfort which was improved with voltaren gel, tylenol, kpad. I recommended she take deep breaths and use incentive spirometer to avoid  atelectasis.   Discharge Diagnoses:  Principal Problem:   Acute metabolic encephalopathy Active Problems:   S/P placement of cardiac pacemaker   HTN (hypertension)   Diabetes mellitus without complication (HCC)   Chronic diastolic CHF (congestive heart failure) (HCC)   COVID-19 virus infection   Hypoglycemia due to insulin   Hypoglycemia   Hypothermia   LBBB (left bundle branch block)   Multiple closed fractures of ribs of right side   Allergies as of 04/29/2021       Reactions   Codeine Nausea Only   Disopyramide    Other reaction(s): Unknown   Ibuprofen Diarrhea   Iodine    blisters   Metformin And Related Other (See Comments)   unknown   Norpace [disopyramide Phosphate]    Quinidine    Other reaction(s): Unknown   Terfenadine    Other reaction(s): Unknown   Topiramate    Other reaction(s): Other (See Comments) Hair loss   Verapamil    Other reaction(s): Unknown   Dexamethasone Sodium Phosphate Palpitations        Medication List     STOP taking these medications    ascorbic acid 500 MG tablet Commonly known as: VITAMIN C   atorvastatin 20 MG tablet Commonly known as: LIPITOR   cyanocobalamin 1000 MCG tablet   guaiFENesin-dextromethorphan 100-10 MG/5ML syrup Commonly known as: ROBITUSSIN DM   Levemir FlexTouch 100 UNIT/ML FlexPen Generic drug: insulin detemir   metolazone 2.5 MG tablet Commonly known as: ZAROXOLYN   omeprazole 20 MG capsule Commonly known as: PRILOSEC   pioglitazone 15 MG tablet Commonly known as: ACTOS   potassium chloride 10 MEQ tablet Commonly known as: KLOR-CON M   sucralfate 1 g tablet Commonly known as: CARAFATE   torsemide  20 MG tablet Commonly known as: DEMADEX   zinc sulfate 220 (50 Zn) MG capsule       TAKE these medications    acetaminophen 325 MG tablet Commonly known as: TYLENOL Take 2 tablets (650 mg total) by mouth every 6 (six) hours as needed for mild pain.   carboxymethylcellulose 0.5 %  Soln Commonly known as: REFRESH PLUS 1 drop 3 (three) times daily as needed.   diclofenac Sodium 1 % Gel Commonly known as: VOLTAREN Apply 2 g topically 4 (four) times daily for 15 days.   digoxin 0.125 MG tablet Commonly known as: LANOXIN Take 1 tablet (0.125 mg total) by mouth every other day. CHF- * Hold for HR < 60   famotidine 20 MG tablet Commonly known as: PEPCID Take 20 mg by mouth at bedtime.   glipiZIDE 10 MG 24 hr tablet Commonly known as: GLUCOTROL XL Take 10 mg by mouth daily with breakfast.   insulin glargine-yfgn 100 UNIT/ML injection Commonly known as: SEMGLEE Inject 0.1 mLs (10 Units total) into the skin daily. Start taking on: April 30, 2021   isosorbide mononitrate 30 MG 24 hr tablet Commonly known as: IMDUR Take 30 mg by mouth daily.   metoprolol succinate 50 MG 24 hr tablet Commonly known as: TOPROL-XL Take 1 tablet (50 mg total) by mouth daily. What changed:  how much to take when to take this additional instructions   mirtazapine 15 MG tablet Commonly known as: REMERON Take 7.5 mg by mouth at bedtime.   nitroGLYCERIN 0.4 MG SL tablet Commonly known as: NITROSTAT Place 0.4 mg under the tongue every 5 (five) minutes as needed for chest pain.   polyethylene glycol 17 g packet Commonly known as: MIRALAX / GLYCOLAX Take 17 g by mouth daily.   PRESERVISION AREDS 2 PO Take 1 tablet by mouth 2 (two) times daily.        Contact information for after-discharge care     Destination     [facility] .   Service: Skilled Nursing Contact information: [address] [place] [address] [phone]                    Allergies  Allergen Reactions   Codeine Nausea Only   Disopyramide     Other reaction(s): Unknown   Ibuprofen Diarrhea   Iodine     blisters   Metformin And Related Other (See Comments)    unknown   Norpace [Disopyramide Phosphate]    Quinidine     Other reaction(s):  Unknown   Terfenadine     Other reaction(s): Unknown   Topiramate     Other reaction(s): Other (See Comments) Hair loss   Verapamil     Other reaction(s): Unknown   Dexamethasone Sodium Phosphate Palpitations   Consultations: None   Procedures/Studies: DG Chest 2 View  Result Date: 04/05/2021 CLINICAL DATA:  Hyperglycemia EXAM: CHEST - 2 VIEW COMPARISON:  03/22/2021 FINDINGS: Cardiomegaly. Pacer lead into the right ventricle. Limited assessment of aortic and hilar contours due to rotation. Coronary atherosclerosis. IMPRESSION: No acute or interval finding. Electronically Signed   By: Tiburcio Pea M.D.   On: 04/05/2021 04:56   CT HEAD WO CONTRAST ( )  Result Date: 04/25/2021 CLINICAL DATA:  Mental status change EXAM: CT HEAD WITHOUT CONTRAST TECHNIQUE: Contiguous axial images were obtained from the base of the skull through the vertex without intravenous contrast. RADIATION DOSE REDUCTION: This exam was performed according to the departmental dose-optimization  program which includes automated exposure control, adjustment of the mA and/or kV according to patient size and/or use of iterative reconstruction technique. COMPARISON:  Head CT dated January 06, 2021 FINDINGS: Brain: Chronic white matter ischemic change. No evidence of acute infarction, hemorrhage, hydrocephalus, extra-axial collection or mass lesion/mass effect. Vascular: No hyperdense vessel or unexpected calcification. Skull: Normal. Negative for fracture or focal lesion. Sinuses/Orbits: No acute finding. Other: None. IMPRESSION: No acute intracranial abnormality Electronically Signed   By: Allegra Lai M.D.   On: 04/25/2021 14:05   DG Chest Port 1 View  Addendum Date: 04/28/2021   ADDENDUM REPORT: 04/28/2021 11:26 ADDENDUM: Mildly displaced fractures of the posterior right 3rd-6th ribs. Electronically Signed   By: Allegra Lai M.D.   On: 04/28/2021 11:26   Result Date: 04/28/2021 CLINICAL DATA:  Evaluate for  abnormality.  Questionable sepsis. EXAM: PORTABLE CHEST 1 VIEW COMPARISON:  04/05/2021 FINDINGS: There is a left chest wall pacer device with lead in the right ventricle. Cardiac enlargement is unchanged from previous exam. No pleural effusion or edema. No airspace opacities. IMPRESSION: No acute cardiopulmonary abnormalities. Electronically Signed: By: Signa Kell M.D. On: 04/25/2021 12:54    Subjective: Patient feels overall well today. She has mild rib pain which is improved with voltaren gel. She is agreeable to go to SNF today and remains hopeful that she can return home after.   Discharge Exam: Vitals:   04/29/21 0427 04/29/21 0846  BP: 120/60 (!) 142/87  Pulse: (!) 59 70  Resp: 18 18  Temp: 97.7 F (36.5 C)   SpO2: 98% 100%    General: Pt is alert, awake, not in acute distress Cardiovascular: RRR, S1/S2 +, no rubs, no gallops Respiratory: CTA bilaterally, no wheezing, no rhonchi Abdominal: Soft, NT, ND, bowel sounds + Extremities: no edema, no cyanosis  Labs: Basic Metabolic Panel: Recent Labs  Lab 04/25/21 1240 04/25/21 1807 04/25/21 2053 04/26/21 0534  NA 138  --   --  140  K 3.1*  --   --  4.2  CL 97*  --   --  99  CO2 31  --   --  31  GLUCOSE 195* 189* 355* 167*  BUN 32*  --   --  29*  CREATININE 0.78  --   --  0.80  CALCIUM 9.9  --   --  9.7  MG  --  2.5*  --   --    CBC: Recent Labs  Lab 04/25/21 1240 04/26/21 0534  WBC 8.6 6.4  NEUTROABS 7.5  --   HGB 11.9* 10.9*  HCT 37.7 34.9*  MCV 81.6 81.5  PLT 247 255    Microbiology Recent Results (from the past 240 hour(s))  Blood Culture (routine x 2)     Status: Abnormal   Collection Time: 04/25/21 12:40 PM   Specimen: BLOOD  Result Value Ref Range Status   Specimen Description   Final    BLOOD RIGHT ANTECUBITAL Performed at [facility], [address] [place], [place] [address]    Special Requests   Final    BOTTLES DRAWN AEROBIC AND ANAEROBIC Blood Culture results may not be  optimal due to an excessive volume of blood received in culture bottles Performed at [facility], [address] [place], [place] [address]    Culture  Setup Time   Final    AEROBIC BOTTLE ONLY GRAM POSITIVE COCCI CRITICAL RESULT CALLED TO, READ BACK BY AND VERIFIED WITH: JASON ROBBINS AT 0702 04/26/21.PMF Performed  at [facility], [address], [place], [place] [address]    Culture STREPTOCOCCUS SALIVARIUS (A)  Final   Report Status 04/28/2021 FINAL  Final   Organism ID, Bacteria STREPTOCOCCUS SALIVARIUS  Final      Susceptibility   Streptococcus salivarius - MIC*    PENICILLIN 0.12 SENSITIVE Sensitive     CEFTRIAXONE <=0.12 SENSITIVE Sensitive     ERYTHROMYCIN <=0.12 SENSITIVE Sensitive     LEVOFLOXACIN 2 SENSITIVE Sensitive     VANCOMYCIN 1 SENSITIVE Sensitive     * STREPTOCOCCUS SALIVARIUS  Resp Panel by RT-PCR (Flu A&B, Covid) Nasopharyngeal Swab     Status: Abnormal   Collection Time: 04/25/21 12:40 PM   Specimen: Nasopharyngeal Swab; Nasopharyngeal(NP) swabs in vial transport medium  Result Value Ref Range Status   SARS Coronavirus 2 by RT PCR POSITIVE (A) NEGATIVE Final    Comment: (NOTE) SARS-CoV-2 target nucleic acids are DETECTED.  The SARS-CoV-2 RNA is generally detectable in upper respiratory specimens during the acute phase of infection. Positive results are indicative of the presence of the identified virus, but do not rule out bacterial infection or co-infection with other pathogens not detected by the test. Clinical correlation with patient history and other diagnostic information is necessary to determine patient infection status. The expected result is Negative.  Fact Sheet for Patients: BloggerCourse.com  Fact Sheet for Healthcare Providers: SeriousBroker.it  This test is not yet approved or cleared by the [place] FDA and  has been authorized for detection and/or diagnosis of  SARS-CoV-2 by FDA under an Emergency Use Authorization (EUA).  This EUA will remain in effect (meaning this test can be used) for the duration of  the COVID-19 declaration under Section 564(b)(1) of the A ct, 21 U.S.C. section 360bbb-3(b)(1), unless the authorization is terminated or revoked sooner.     Influenza A by PCR NEGATIVE NEGATIVE Final   Influenza B by PCR NEGATIVE NEGATIVE Final    Comment: (NOTE) The Xpert Xpress SARS-CoV-2/FLU/RSV plus assay is intended as an aid in the diagnosis of influenza from Nasopharyngeal swab specimens and should not be used as a sole basis for treatment. Nasal washings and aspirates are unacceptable for Xpert Xpress SARS-CoV-2/FLU/RSV testing.  Fact Sheet for Patients: BloggerCourse.com  Fact Sheet for Healthcare Providers: SeriousBroker.it  This test is not yet approved or cleared by the [place] FDA and has been authorized for detection and/or diagnosis of SARS-CoV-2 by FDA under an Emergency Use Authorization (EUA). This EUA will remain in effect (meaning this test can be used) for the duration of the COVID-19 declaration under Section 564(b)(1) of the Act, 21 U.S.C. section 360bbb-3(b)(1), unless the authorization is terminated or revoked.  Performed at [facility], [address] [place], [place] [address]   Blood Culture ID Panel (Reflexed)     Status: Abnormal   Collection Time: 04/25/21 12:40 PM  Result Value Ref Range Status   Enterococcus faecalis NOT DETECTED NOT DETECTED Final   Enterococcus Faecium NOT DETECTED NOT DETECTED Final   Listeria monocytogenes NOT DETECTED NOT DETECTED Final   Staphylococcus species NOT DETECTED NOT DETECTED Final   Staphylococcus aureus (BCID) NOT DETECTED NOT DETECTED Final   Staphylococcus epidermidis NOT DETECTED NOT DETECTED Final   Staphylococcus lugdunensis NOT DETECTED NOT DETECTED Final   Streptococcus species DETECTED  (A) NOT DETECTED Final    Comment: Not Enterococcus species, Streptococcus agalactiae, Streptococcus pyogenes, or Streptococcus pneumoniae. CRITICAL RESULT CALLED TO, READ BACK BY AND VERIFIED WITH: JASON ROBBINS AT  4132 04/26/21.PMF    Streptococcus agalactiae NOT DETECTED NOT DETECTED Final   Streptococcus pneumoniae NOT DETECTED NOT DETECTED Final   Streptococcus pyogenes NOT DETECTED NOT DETECTED Final   A.calcoaceticus-baumannii NOT DETECTED NOT DETECTED Final   Bacteroides fragilis NOT DETECTED NOT DETECTED Final   Enterobacterales NOT DETECTED NOT DETECTED Final   Enterobacter cloacae complex NOT DETECTED NOT DETECTED Final   Escherichia coli NOT DETECTED NOT DETECTED Final   Klebsiella aerogenes NOT DETECTED NOT DETECTED Final   Klebsiella oxytoca NOT DETECTED NOT DETECTED Final   Klebsiella pneumoniae NOT DETECTED NOT DETECTED Final   Proteus species NOT DETECTED NOT DETECTED Final   Salmonella species NOT DETECTED NOT DETECTED Final   Serratia marcescens NOT DETECTED NOT DETECTED Final   Haemophilus influenzae NOT DETECTED NOT DETECTED Final   Neisseria meningitidis NOT DETECTED NOT DETECTED Final   Pseudomonas aeruginosa NOT DETECTED NOT DETECTED Final   Stenotrophomonas maltophilia NOT DETECTED NOT DETECTED Final   Candida albicans NOT DETECTED NOT DETECTED Final   Candida auris NOT DETECTED NOT DETECTED Final   Candida glabrata NOT DETECTED NOT DETECTED Final   Candida krusei NOT DETECTED NOT DETECTED Final   Candida parapsilosis NOT DETECTED NOT DETECTED Final   Candida tropicalis NOT DETECTED NOT DETECTED Final   Cryptococcus neoformans/gattii NOT DETECTED NOT DETECTED Final    Comment: Performed at [facility], [address] [place], [place] [address]  Blood Culture (routine x 2)     Status: None (Preliminary result)   Collection Time: 04/25/21  6:07 PM   Specimen: BLOOD  Result Value Ref Range Status   Specimen Description BLOOD BLOOD LEFT FOREARM   Final   Special Requests   Final    BOTTLES DRAWN AEROBIC AND ANAEROBIC Blood Culture adequate volume   Culture   Final    NO GROWTH 4 DAYS Performed at [facility], [address], [place], [place] [address]    Report Status PENDING  Incomplete    Time coordinating discharge: Over 30 minutes  Leeroy Bock, MD  [facility] 04/29/2021, 12:16 PM

## 2021-04-29 NOTE — Plan of Care (Signed)
Problem: Education: Goal: Knowledge of General Education information will improve Description: Including pain rating scale, medication(s)/side effects and non-pharmacologic comfort measures Outcome: Completed/Met   Problem: Health Behavior/Discharge Planning: Goal: Ability to manage health-related needs will improve Outcome: Completed/Met   Problem: Clinical Measurements: Goal: Ability to maintain clinical measurements within normal limits will improve Outcome: Completed/Met Goal: Will remain free from infection Outcome: Completed/Met Goal: Diagnostic test results will improve Outcome: Completed/Met Goal: Respiratory complications will improve Outcome: Completed/Met Goal: Cardiovascular complication will be avoided Outcome: Completed/Met   Problem: Activity: Goal: Risk for activity intolerance will decrease Outcome: Completed/Met   Problem: Nutrition: Goal: Adequate nutrition will be maintained Outcome: Completed/Met   Problem: Coping: Goal: Level of anxiety will decrease Outcome: Completed/Met   Problem: Elimination: Goal: Will not experience complications related to bowel motility Outcome: Completed/Met Goal: Will not experience complications related to urinary retention Outcome: Completed/Met   Problem: Pain Managment: Goal: General experience of comfort will improve Outcome: Completed/Met   Problem: Safety: Goal: Ability to remain free from injury will improve Outcome: Completed/Met   Problem: Skin Integrity: Goal: Risk for impaired skin integrity will decrease Outcome: Completed/Met   Problem: Acute Rehab PT Goals(only PT should resolve) Goal: Pt Will Ambulate Outcome: Completed/Met

## 2021-04-29 NOTE — Progress Notes (Signed)
Mobility Specialist - Progress Note   04/29/21 1500  Mobility  Activity Ambulated with assistance in room;Stood at bedside;Dangled on edge of bed  Range of Motion/Exercises Active  Level of Assistance Standby assist, set-up cues, supervision of patient - no hands on  Assistive Device Front wheel walker  Distance Ambulated (ft) 4 ft  Activity Response Tolerated well  $Mobility charge 1 Mobility     Mobility responded to bed alarm. Upon arrival, pt ambulating along EOB requesting assistance for bathing and dressing. Set-up assist provided for bathing. Supervision to stand for LB dressing. Pt returned supine with alarm set. RN notified.   Kathee Delton Mobility Specialist 04/29/21, 3:04 PM

## 2021-04-29 NOTE — TOC Transition Note (Signed)
Transition of Care [facility]) - CM/SW Discharge Note   Patient Details  Name: Elizabeth Novak MRN: [id number] Date of Birth: July 16, 1923  [facility] CM/SW Contact:  Beverly Sessions, RN Phone Number: 04/29/2021, 1:42 PM   Clinical Narrative:      Patient will DC to: [place] Anticipated DC date:04/29/21  Family notified:Son Stan Transport by:EMS  Per MD patient ready for DC to . RN, patient, patient's family, and facility notified of DC. Discharge Summary sent to facility. RN given number for report. DC packet on chart. Ambulance transport requested for patient.  TOC signing off.  Isaias Cowman [facility] [phone]        Patient Goals and CMS Choice        Discharge Placement                       Discharge Plan and Services                                     Social Determinants of Health (SDOH) Interventions     Readmission Risk Interventions No flowsheet data found.

## 2021-04-29 NOTE — Progress Notes (Signed)
Patient discharged to [facility] via EMS. Discharge instructions given to EMS. Report given to [facility] nurse with no complications.

## 2021-05-01 LAB — URINE CULTURE: Culture: 70000 — AB

## 2021-05-03 LAB — CULTURE, BLOOD (ROUTINE X 2)
Culture: NO GROWTH
Special Requests: ADEQUATE

## 2021-06-26 ENCOUNTER — Emergency Department
Admission: EM | Admit: 2021-06-26 | Discharge: 2021-06-26 | Disposition: A | Discharge status: Home / Self Care | Payer: Medicare Other | Attending: Emergency Medicine | Admitting: Emergency Medicine

## 2021-06-26 DIAGNOSIS — E119 Type 2 diabetes mellitus without complications: Secondary | ICD-10-CM | POA: Insufficient documentation

## 2021-06-26 DIAGNOSIS — R339 Retention of urine, unspecified: Secondary | ICD-10-CM | POA: Insufficient documentation

## 2021-06-26 DIAGNOSIS — R1030 Lower abdominal pain, unspecified: Secondary | ICD-10-CM | POA: Insufficient documentation

## 2021-06-26 DIAGNOSIS — I509 Heart failure, unspecified: Secondary | ICD-10-CM | POA: Diagnosis not present

## 2021-06-26 DIAGNOSIS — I110 Hypertensive heart disease with heart failure: Secondary | ICD-10-CM | POA: Diagnosis not present

## 2021-06-26 DIAGNOSIS — E876 Hypokalemia: Secondary | ICD-10-CM | POA: Insufficient documentation

## 2021-06-26 LAB — URINALYSIS, ROUTINE W REFLEX MICROSCOPIC
Bilirubin Urine: NEGATIVE
Glucose, UA: NEGATIVE mg/dL
Hgb urine dipstick: NEGATIVE
Ketones, ur: NEGATIVE mg/dL
Leukocytes,Ua: NEGATIVE
Nitrite: NEGATIVE
Protein, ur: NEGATIVE mg/dL
Specific Gravity, Urine: 1.01 (ref 1.005–1.030)
pH: 6 (ref 5.0–8.0)

## 2021-06-26 LAB — BASIC METABOLIC PANEL
Anion gap: 13 (ref 5–15)
BUN: 42 mg/dL — ABNORMAL HIGH (ref 8–23)
CO2: 33 mmol/L — ABNORMAL HIGH (ref 22–32)
Calcium: 9.1 mg/dL (ref 8.9–10.3)
Chloride: 88 mmol/L — ABNORMAL LOW (ref 98–111)
Creatinine, Ser: 0.84 mg/dL (ref 0.44–1.00)
GFR, Estimated: >60 mL/min (ref >60)
Glucose, Bld: 120 mg/dL — ABNORMAL HIGH (ref 70–99)
Potassium: 2.4 mmol/L — CL (ref 3.5–5.1)
Sodium: 134 mmol/L — ABNORMAL LOW (ref 135–145)

## 2021-06-26 LAB — CBC WITH DIFFERENTIAL/PLATELET
Abs Immature Granulocytes: 0.03 10*3/uL (ref 0.00–0.07)
Basophils Absolute: 0 10*3/uL (ref 0.0–0.1)
Basophils Relative: 1 %
Eosinophils Absolute: 0.1 10*3/uL (ref 0.0–0.5)
Eosinophils Relative: 1 %
HCT: 40.7 % (ref 36.0–46.0)
Hemoglobin: 13.2 g/dL (ref 12.0–15.0)
Immature Granulocytes: 1 %
Lymphocytes Relative: 20 %
Lymphs Abs: 1.2 10*3/uL (ref 0.7–4.0)
MCH: 26.8 pg (ref 26.0–34.0)
MCHC: 32.4 g/dL (ref 30.0–36.0)
MCV: 82.7 fL (ref 80.0–100.0)
Monocytes Absolute: 0.7 10*3/uL (ref 0.1–1.0)
Monocytes Relative: 12 %
Neutro Abs: 4 10*3/uL (ref 1.7–7.7)
Neutrophils Relative %: 65 %
Platelets: 218 10*3/uL (ref 150–400)
RBC: 4.92 MIL/uL (ref 3.87–5.11)
RDW: 15.3 % (ref 11.5–15.5)
WBC: 6.1 10*3/uL (ref 4.0–10.5)
nRBC: 1.1 % — ABNORMAL HIGH (ref 0.0–0.2)

## 2021-06-26 LAB — CBG MONITORING, ED
Glucose-Capillary: 64 mg/dL — ABNORMAL LOW (ref 70–99)
Glucose-Capillary: 92 mg/dL (ref 70–99)

## 2021-06-26 MED ORDER — POTASSIUM CHLORIDE CRYS ER 20 MEQ PO TBCR: 20.0000 meq | EXTENDED_RELEASE_TABLET | Freq: Every day | ORAL | 0 refills | Status: DC

## 2021-06-26 MED ORDER — LACTATED RINGERS IV BOLUS
500.0000 mL | Freq: Once | INTRAVENOUS | Status: AC
  Administered 2021-06-26: 500 mL via INTRAVENOUS

## 2021-06-26 MED ORDER — MAGNESIUM SULFATE 2 GM/50ML IV SOLN
2.0000 g | Freq: Once | INTRAVENOUS | Status: AC
  Administered 2021-06-26: 2 g via INTRAVENOUS
  Filled 2021-06-26: qty 50

## 2021-06-26 MED ORDER — POTASSIUM CHLORIDE 10 MEQ/100ML IV SOLN
10.0000 meq | INTRAVENOUS | Status: AC
  Administered 2021-06-26 (×2): 10 meq via INTRAVENOUS
  Filled 2021-06-26 (×2): qty 100

## 2021-06-26 MED ORDER — POTASSIUM CHLORIDE CRYS ER 20 MEQ PO TBCR
40.0000 meq | EXTENDED_RELEASE_TABLET | Freq: Once | ORAL | Status: AC
  Administered 2021-06-26: 40 meq via ORAL
  Filled 2021-06-26: qty 2

## 2021-06-26 NOTE — ED Triage Notes (Signed)
BIB EMS from [facility]. Abd pain started this morning. No BM since Sunday. Urge to urinate with no success. Abd distended painful to touch.  ? ?A&O x 4  ?Ambulatory with walker.  ?BP 134/76 ?HR 71 ? ?

## 2021-06-26 NOTE — ED Notes (Signed)
Pt given apple juice and graham crackers and informed will be back in a few minutes to recheck cbg; pt verbalized understanding ?

## 2021-06-26 NOTE — ED Provider Notes (Signed)
? ?[facility] ?Provider Note ? ? ? Event Date/Time  ? First MD Initiated Contact with Patient 06/26/21 1552   ?  (approximate) ? ? ?History  ? ?Chief Complaint ?Abdominal Pain ? ?HPI ? ?Elizabeth Novak is a 88 y.o. female with past medical history of hypertension, diabetes, CHF, atrial fibrillation, sick sinus syndrome status post pacemaker, and stroke who presents to the ED complaining of abdominal pain.  Patient reports that she woke up around 3:00 this morning with significant pain in her suprapubic area.  She states she has been unable to pee since then and had some burning when she peed yesterday.  She denies any fevers, flank pain, nausea, or vomiting but does state current symptoms feel similar to prior UTI.  She states she has never required a Foley catheter in the past and denies any history of difficulty urinating. ?  ? ? ?Physical Exam  ? ?Triage Vital Signs: ?ED Triage Vitals  ?Enc Vitals Group  ?   BP   ?   Pulse   ?   Resp   ?   Temp   ?   Temp src   ?   SpO2   ?   Weight   ?   Height   ?   Head Circumference   ?   Peak Flow   ?   Pain Score   ?   Pain Loc   ?   Pain Edu?   ?   Excl. in [place]?   ? ? ?Most recent vital signs: ?Vitals:  ? 06/26/21 2000 06/26/21 2030  ?BP: 140/64 135/64  ?Pulse: (!) 59 (!) 58  ?Resp: 12 13  ?Temp:    ?SpO2: 91% 98%  ? ? ?Constitutional: Alert and oriented. ?Eyes: Conjunctivae are normal. ?Head: Atraumatic. ?Nose: No congestion/rhinnorhea. ?Mouth/Throat: Mucous membranes are moist.  ?Cardiovascular: Normal rate, regular rhythm. Grossly normal heart sounds.  2+ radial pulses bilaterally. ?Respiratory: Normal respiratory effort.  No retractions. Lungs CTAB. ?Gastrointestinal: Severely tender to palpation in the suprapubic area with firm bladder noted.  No CVA tenderness bilaterally.  No distention. ?Musculoskeletal: No lower extremity tenderness nor edema.  ?Neurologic:  Normal speech and language. No gross focal neurologic deficits are  appreciated. ? ? ? ?ED Results / Procedures / Treatments  ? ?Labs ?(all labs ordered are listed, but only abnormal results are displayed) ?Labs Reviewed  ?CBC WITH DIFFERENTIAL/PLATELET - Abnormal; Notable for the following components:  ?    Result Value  ? nRBC 1.1 (*)   ? All other components within normal limits  ?BASIC METABOLIC PANEL - Abnormal; Notable for the following components:  ? Sodium 134 (*)   ? Potassium 2.4 (*)   ? Chloride 88 (*)   ? CO2 33 (*)   ? Glucose, Bld 120 (*)   ? BUN 42 (*)   ? All other components within normal limits  ?URINALYSIS, ROUTINE W REFLEX MICROSCOPIC - Abnormal; Notable for the following components:  ? Color, Urine YELLOW (*)   ? APPearance CLEAR (*)   ? All other components within normal limits  ?CBG MONITORING, ED - Abnormal; Notable for the following components:  ? Glucose-Capillary 64 (*)   ? All other components within normal limits  ?CBG MONITORING, ED  ? ? ? ?PROCEDURES: ? ?Critical Care performed: No ? ?Procedures ? ? ?MEDICATIONS ORDERED IN ED: ?Medications  ?potassium chloride SA (KLOR-CON M) CR tablet 40 mEq (40 mEq Oral Given 06/26/21 1819)  ?lactated ringers bolus  500 mL (0 mLs Intravenous Stopped 06/26/21 2115)  ?magnesium sulfate IVPB 2 g 50 mL (0 g Intravenous Stopped 06/26/21 2019)  ?potassium chloride 10 mEq in 100 mL IVPB (0 mEq Intravenous Stopped 06/26/21 2046)  ? ? ? ?IMPRESSION / MDM / ASSESSMENT AND PLAN / ED COURSE  ?I reviewed the triage vital signs and the nursing notes. ?             ?               ? ?88 y.o. female with past medical history of hypertension, diabetes, CHF, atrial fibrillation, sick sinus syndrome status post pacemaker, and stroke who presents to the ED complaining of suprapubic abdominal pain and difficulty urinating. ? ?Differential diagnosis includes, but is not limited to, urinary retention, UTI, AKI, electrolyte abnormality, pyelonephritis. ? ?Patient uncomfortable appearing but nontoxic with reassuring vital signs, has significant  tenderness in her suprapubic area with what feels to be firm and distended bladder.  Bladder scan was significantly elevated and Foley catheter placed with return of greater than 800 cc of clear yellow urine.  We will send UA and check basic labs, patient is much more comfortable following placement of Foley catheter. ? ?UA does not appear consistent with infection, CBC with no anemia or leukocytosis.  BMP is remarkable for elevated BUN as well as hypokalemia.  Patient was given IV fluid bolus and supplemental potassium as well as supplemental magnesium.  She continues to drain urine appropriately and is appropriate for discharge home with urology follow-up.  She will also be prescribed additional supplemental potassium and was counseled to follow-up with her PCP for recheck of potassium levels.  This information was also communicated to her niece via telephone, who will assist with follow-up.  Family was counseled to have patient return to the ED for new worsening symptoms, patient agrees with plan. ? ?  ? ? ?FINAL CLINICAL IMPRESSION(S) / ED DIAGNOSES  ? ?Final diagnoses:  ?Urinary retention  ?Hypokalemia  ? ? ? ?Rx / DC Orders  ? ?ED Discharge Orders   ? ?      Ordered  ?  potassium chloride SA (KLOR-CON M) 20 MEQ tablet  Daily       ? 06/26/21 2126  ? ?  ?  ? ?  ? ? ? ?Note:  This document was prepared using Dragon voice recognition software and may include unintentional dictation errors. ?  ?Blake Divine, MD ?06/26/21 2128 ? ?

## 2021-06-26 NOTE — ED Notes (Signed)
Critical potassium 2.4 Dr. Charna Archer made aware.  ?

## 2021-06-26 NOTE — ED Notes (Signed)
[facility] called to transport back to [facility] ?

## 2021-07-01 ENCOUNTER — Inpatient Hospital Stay
Admission: EM | Admit: 2021-07-01 | Discharge: 2021-07-03 | DRG: 291 | Disposition: A | Discharge status: Home Health | Payer: Medicare Other | Attending: Hospitalist | Admitting: Hospitalist

## 2021-07-01 ENCOUNTER — Emergency Department: Payer: Medicare Other

## 2021-07-01 ENCOUNTER — Encounter: Payer: Self-pay | Admitting: Emergency Medicine

## 2021-07-01 ENCOUNTER — Other Ambulatory Visit: Payer: Self-pay

## 2021-07-01 DIAGNOSIS — Z9013 Acquired absence of bilateral breasts and nipples: Secondary | ICD-10-CM

## 2021-07-01 DIAGNOSIS — F418 Other specified anxiety disorders: Secondary | ICD-10-CM | POA: Diagnosis present

## 2021-07-01 DIAGNOSIS — I10 Essential (primary) hypertension: Secondary | ICD-10-CM | POA: Diagnosis present

## 2021-07-01 DIAGNOSIS — K219 Gastro-esophageal reflux disease without esophagitis: Secondary | ICD-10-CM | POA: Diagnosis present

## 2021-07-01 DIAGNOSIS — Z9109 Other allergy status, other than to drugs and biological substances: Secondary | ICD-10-CM

## 2021-07-01 DIAGNOSIS — R8281 Pyuria: Secondary | ICD-10-CM | POA: Diagnosis present

## 2021-07-01 DIAGNOSIS — I5033 Acute on chronic diastolic (congestive) heart failure: Secondary | ICD-10-CM | POA: Diagnosis not present

## 2021-07-01 DIAGNOSIS — E119 Type 2 diabetes mellitus without complications: Secondary | ICD-10-CM

## 2021-07-01 DIAGNOSIS — I4891 Unspecified atrial fibrillation: Secondary | ICD-10-CM | POA: Diagnosis present

## 2021-07-01 DIAGNOSIS — Z960 Presence of urogenital implants: Secondary | ICD-10-CM | POA: Diagnosis present

## 2021-07-01 DIAGNOSIS — E876 Hypokalemia: Secondary | ICD-10-CM | POA: Diagnosis present

## 2021-07-01 DIAGNOSIS — F0394 Unspecified dementia, unspecified severity, with anxiety: Secondary | ICD-10-CM | POA: Diagnosis present

## 2021-07-01 DIAGNOSIS — Z950 Presence of cardiac pacemaker: Secondary | ICD-10-CM

## 2021-07-01 DIAGNOSIS — Z794 Long term (current) use of insulin: Secondary | ICD-10-CM

## 2021-07-01 DIAGNOSIS — E782 Mixed hyperlipidemia: Secondary | ICD-10-CM

## 2021-07-01 DIAGNOSIS — I110 Hypertensive heart disease with heart failure: Principal | ICD-10-CM | POA: Diagnosis present

## 2021-07-01 DIAGNOSIS — I081 Rheumatic disorders of both mitral and tricuspid valves: Secondary | ICD-10-CM | POA: Diagnosis present

## 2021-07-01 DIAGNOSIS — I509 Heart failure, unspecified: Secondary | ICD-10-CM

## 2021-07-01 DIAGNOSIS — K8020 Calculus of gallbladder without cholecystitis without obstruction: Secondary | ICD-10-CM | POA: Diagnosis present

## 2021-07-01 DIAGNOSIS — Z888 Allergy status to other drugs, medicaments and biological substances status: Secondary | ICD-10-CM

## 2021-07-01 DIAGNOSIS — R410 Disorientation, unspecified: Secondary | ICD-10-CM

## 2021-07-01 DIAGNOSIS — Z8249 Family history of ischemic heart disease and other diseases of the circulatory system: Secondary | ICD-10-CM

## 2021-07-01 DIAGNOSIS — E785 Hyperlipidemia, unspecified: Secondary | ICD-10-CM | POA: Diagnosis present

## 2021-07-01 DIAGNOSIS — F419 Anxiety disorder, unspecified: Secondary | ICD-10-CM | POA: Diagnosis present

## 2021-07-01 DIAGNOSIS — K5641 Fecal impaction: Secondary | ICD-10-CM | POA: Diagnosis present

## 2021-07-01 DIAGNOSIS — I495 Sick sinus syndrome: Secondary | ICD-10-CM | POA: Diagnosis present

## 2021-07-01 DIAGNOSIS — F0393 Unspecified dementia, unspecified severity, with mood disturbance: Secondary | ICD-10-CM | POA: Diagnosis present

## 2021-07-01 DIAGNOSIS — R338 Other retention of urine: Secondary | ICD-10-CM | POA: Diagnosis present

## 2021-07-01 DIAGNOSIS — K5909 Other constipation: Secondary | ICD-10-CM | POA: Diagnosis present

## 2021-07-01 DIAGNOSIS — T502X5A Adverse effect of carbonic-anhydrase inhibitors, benzothiadiazides and other diuretics, initial encounter: Secondary | ICD-10-CM | POA: Diagnosis present

## 2021-07-01 DIAGNOSIS — Z66 Do not resuscitate: Secondary | ICD-10-CM | POA: Diagnosis present

## 2021-07-01 DIAGNOSIS — F05 Delirium due to known physiological condition: Secondary | ICD-10-CM | POA: Diagnosis not present

## 2021-07-01 DIAGNOSIS — R0600 Dyspnea, unspecified: Secondary | ICD-10-CM | POA: Diagnosis present

## 2021-07-01 DIAGNOSIS — Z79899 Other long term (current) drug therapy: Secondary | ICD-10-CM

## 2021-07-01 DIAGNOSIS — Z7984 Long term (current) use of oral hypoglycemic drugs: Secondary | ICD-10-CM

## 2021-07-01 DIAGNOSIS — Z853 Personal history of malignant neoplasm of breast: Secondary | ICD-10-CM

## 2021-07-01 DIAGNOSIS — R339 Retention of urine, unspecified: Secondary | ICD-10-CM | POA: Diagnosis present

## 2021-07-01 DIAGNOSIS — F32A Depression, unspecified: Secondary | ICD-10-CM | POA: Diagnosis present

## 2021-07-01 DIAGNOSIS — Z885 Allergy status to narcotic agent status: Secondary | ICD-10-CM

## 2021-07-01 DIAGNOSIS — M1990 Unspecified osteoarthritis, unspecified site: Secondary | ICD-10-CM | POA: Diagnosis present

## 2021-07-01 LAB — CBC
HCT: 38.2 % (ref 36.0–46.0)
Hemoglobin: 12.1 g/dL (ref 12.0–15.0)
MCH: 26.8 pg (ref 26.0–34.0)
MCHC: 31.7 g/dL (ref 30.0–36.0)
MCV: 84.7 fL (ref 80.0–100.0)
Platelets: 224 10*3/uL (ref 150–400)
RBC: 4.51 MIL/uL (ref 3.87–5.11)
RDW: 15.5 % (ref 11.5–15.5)
WBC: 5.2 10*3/uL (ref 4.0–10.5)
nRBC: 0 % (ref 0.0–0.2)

## 2021-07-01 LAB — BASIC METABOLIC PANEL
Anion gap: 10 (ref 5–15)
BUN: 24 mg/dL — ABNORMAL HIGH (ref 8–23)
CO2: 32 mmol/L (ref 22–32)
Calcium: 9.4 mg/dL (ref 8.9–10.3)
Chloride: 93 mmol/L — ABNORMAL LOW (ref 98–111)
Creatinine, Ser: 0.87 mg/dL (ref 0.44–1.00)
GFR, Estimated: >60 mL/min (ref >60)
Glucose, Bld: 216 mg/dL — ABNORMAL HIGH (ref 70–99)
Potassium: 3.7 mmol/L (ref 3.5–5.1)
Sodium: 135 mmol/L (ref 135–145)

## 2021-07-01 LAB — URINALYSIS, ROUTINE W REFLEX MICROSCOPIC
Bilirubin Urine: NEGATIVE
Glucose, UA: 150 mg/dL — AB
Ketones, ur: NEGATIVE mg/dL
Nitrite: NEGATIVE
Protein, ur: NEGATIVE mg/dL
Specific Gravity, Urine: 1.011 (ref 1.005–1.030)
WBC, UA: >50 WBC/hpf — ABNORMAL HIGH (ref 0–5)
pH: 7 (ref 5.0–8.0)

## 2021-07-01 LAB — CBG MONITORING, ED: Glucose-Capillary: 158 mg/dL — ABNORMAL HIGH (ref 70–99)

## 2021-07-01 LAB — LACTIC ACID, PLASMA: Lactic Acid, Venous: 1.3 mmol/L (ref 0.5–1.9)

## 2021-07-01 LAB — BRAIN NATRIURETIC PEPTIDE: B Natriuretic Peptide: 364.1 pg/mL — ABNORMAL HIGH (ref 0.0–100.0)

## 2021-07-01 MED ORDER — INSULIN GLARGINE-YFGN 100 UNIT/ML ~~LOC~~ SOLN
10.0000 [IU] | Freq: Every evening | SUBCUTANEOUS | Status: DC
  Filled 2021-07-01: qty 0.1

## 2021-07-01 MED ORDER — NITROGLYCERIN 0.4 MG SL SUBL: 0.4000 mg | SUBLINGUAL_TABLET | SUBLINGUAL | Status: DC | PRN

## 2021-07-01 MED ORDER — POLYETHYLENE GLYCOL 3350 17 G PO PACK: 17.0000 g | PACK | Freq: Every day | ORAL | Status: DC

## 2021-07-01 MED ORDER — INSULIN GLARGINE-YFGN 100 UNIT/ML ~~LOC~~ SOLN
10.0000 [IU] | Freq: Every evening | SUBCUTANEOUS | Status: DC
  Administered 2021-07-01 – 2021-07-02 (×2): 10 [IU] via SUBCUTANEOUS
  Filled 2021-07-01 (×4): qty 0.1

## 2021-07-01 MED ORDER — ACETAMINOPHEN 650 MG RE SUPP: 650.0000 mg | Freq: Four times a day (QID) | RECTAL | Status: DC | PRN

## 2021-07-01 MED ORDER — SORBITOL 70 % SOLN
300.0000 mL | TOPICAL_OIL | Freq: Once | ORAL | Status: AC
  Administered 2021-07-02: 300 mL via RECTAL
  Filled 2021-07-01: qty 90

## 2021-07-01 MED ORDER — FUROSEMIDE 10 MG/ML IJ SOLN
40.0000 mg | Freq: Once | INTRAMUSCULAR | Status: AC
  Administered 2021-07-02: 40 mg via INTRAVENOUS
  Filled 2021-07-01: qty 4

## 2021-07-01 MED ORDER — FENTANYL CITRATE PF 50 MCG/ML IJ SOSY
50.0000 ug | PREFILLED_SYRINGE | Freq: Once | INTRAMUSCULAR | Status: AC
  Administered 2021-07-01: 50 ug via INTRAVENOUS
  Filled 2021-07-01: qty 1

## 2021-07-01 MED ORDER — INSULIN ASPART 100 UNIT/ML IJ SOLN
0.0000 [IU] | Freq: Three times a day (TID) | INTRAMUSCULAR | Status: DC
  Administered 2021-07-02 (×2): 3 [IU] via SUBCUTANEOUS
  Administered 2021-07-03: 2 [IU] via SUBCUTANEOUS
  Filled 2021-07-01 (×3): qty 1

## 2021-07-01 MED ORDER — INSULIN ASPART 100 UNIT/ML IJ SOLN: 0.0000 [IU] | Freq: Every day | INTRAMUSCULAR | Status: DC

## 2021-07-01 MED ORDER — FUROSEMIDE 10 MG/ML IJ SOLN
60.0000 mg | Freq: Once | INTRAMUSCULAR | Status: AC
  Administered 2021-07-01: 60 mg via INTRAVENOUS
  Filled 2021-07-01: qty 8

## 2021-07-01 MED ORDER — ONDANSETRON HCL 4 MG PO TABS: 4.0000 mg | ORAL_TABLET | Freq: Four times a day (QID) | ORAL | Status: DC | PRN

## 2021-07-01 MED ORDER — SODIUM CHLORIDE 0.9 % IV SOLN
1.0000 g | INTRAVENOUS | Status: DC
  Administered 2021-07-01: 1 g via INTRAVENOUS
  Filled 2021-07-01: qty 10

## 2021-07-01 MED ORDER — ENOXAPARIN SODIUM 40 MG/0.4ML IJ SOSY
40.0000 mg | PREFILLED_SYRINGE | Freq: Every day | INTRAMUSCULAR | Status: DC
  Administered 2021-07-01 – 2021-07-02 (×2): 40 mg via SUBCUTANEOUS
  Filled 2021-07-01 (×2): qty 0.4

## 2021-07-01 MED ORDER — ACETAMINOPHEN 325 MG PO TABS: 650.0000 mg | ORAL_TABLET | Freq: Four times a day (QID) | ORAL | Status: DC | PRN

## 2021-07-01 MED ORDER — IOHEXOL 300 MG/ML  SOLN
75.0000 mL | Freq: Once | INTRAMUSCULAR | Status: AC | PRN
  Administered 2021-07-01: 75 mL via INTRAVENOUS

## 2021-07-01 MED ORDER — ONDANSETRON HCL 4 MG/2ML IJ SOLN: 4.0000 mg | Freq: Four times a day (QID) | INTRAMUSCULAR | Status: DC | PRN

## 2021-07-01 MED ORDER — FAMOTIDINE 20 MG PO TABS
20.0000 mg | ORAL_TABLET | Freq: Every day | ORAL | Status: DC
  Administered 2021-07-01 – 2021-07-02 (×2): 20 mg via ORAL
  Filled 2021-07-01 (×2): qty 1

## 2021-07-01 MED ORDER — MIRTAZAPINE 15 MG PO TABS
15.0000 mg | ORAL_TABLET | Freq: Every day | ORAL | Status: DC
  Administered 2021-07-01 – 2021-07-02 (×2): 15 mg via ORAL
  Filled 2021-07-01 (×2): qty 1

## 2021-07-01 MED ORDER — SODIUM CHLORIDE 0.9 % IV SOLN: 2.0000 g | INTRAVENOUS | Status: DC

## 2021-07-01 MED ORDER — POLYETHYLENE GLYCOL 3350 17 G PO PACK
34.0000 g | PACK | Freq: Once | ORAL | Status: AC
  Administered 2021-07-01: 34 g via ORAL
  Filled 2021-07-01: qty 2

## 2021-07-01 NOTE — H&P (Addendum)
History and Physical   Elizabeth Novak ZOX:[id number] DOB: September 29, 1923 DOA: 07/01/2021  PCP: Diona Browner [facility]  Patient coming from: Elizabeth Novak  I have personally briefly reviewed patient's old medical records in [facility] EMR.  Chief Concern: Shortness of breath  HPI: Elizabeth Novak is a 88 year old female with history of hypertension, hyperlipidemia, insulin-dependent diabetes mellitus, dementia, GERD, chronic constipation, urinary retention status post Foley placement on 06/26/2021, who presents emergency department for chief concerns of shortness of breath.  Initial vitals in the emergency department showed temperature of 98.1, respiration rate of 13, heart rate of 70, blood pressure of 132/55, SPO2 98% on room air.  Serum sodium 135, potassium 3.7, chloride 93, bicarb 23, BUN of 24, serum creatinine of 0.87, GFR greater than 60, nonfasting blood glucose 216, WBC 5.2, hemoglobin 12.1, platelets of 224.  BNP elevated at 364.  Lactic acid was 1.3.  UA was positive for large leukocytes.  CT abdomen and pelvis with contrast was ordered in the ED and was read as: Cholelithiasis without complicating factors.  Area of decreased enhancement in the lower pole of the right kidney representing area of ischemia.  Findings suggestive of early rectal impaction.  ED treatment: Attempted bedside manual fecal disimpaction without success.  Fentanyl 50 mcg, milk of magnesia, GlycoLax, furosemide 60 mg IV one-time dose.  At bedside, she is able to tell me her full name, age, current calendar year. She was able to identify niece at bedside.   She reports that when she lays down, she gets shortness of breath. Niece states it may be about one week now.   Social history: She is from [place]. She did not have history of tobacco, etoh, recreational drug use. She is retired and formerly worked as a Licensed conveyancer.   ROS: Constitutional: no weight change, no  fever ENT/Mouth: no sore throat, no rhinorrhea Eyes: no eye pain, no vision changes Cardiovascular: no chest pain, + dyspnea,  + edema, no palpitations Respiratory: no cough, no sputum, no wheezing Gastrointestinal: no nausea, no vomiting, no diarrhea, no constipation Genitourinary: no urinary incontinence, no dysuria, no hematuria Musculoskeletal: no arthralgias, no myalgias Skin: no skin lesions, no pruritus, Neuro: + weakness, no loss of consciousness, no syncope Psych: no anxiety, no depression, no decrease appetite Heme/Lymph: no bruising, no bleeding  ED Course: Gust with emergency medicine provider, patient requiring hospitalization for heart failure exacerbation.  Assessment/Plan  Principal Problem:   Acute exacerbation of CHF (congestive heart failure) (HCC) Active Problems:   Sick sinus syndrome (HCC)   GERD without esophagitis   Chronic constipation   Acute on chronic diastolic heart failure (HCC)   Anxiety with depression   HTN (hypertension)   HLD (hyperlipidemia)   Insulin dependent type 2 diabetes mellitus (HCC)   Pyuria   Urinary retention   Assessment and Plan:  * Acute exacerbation of CHF (congestive heart failure) (HCC) - Status post furosemide 60 mg per EDP - Last echo was done in 01/31/2019, with LV ejection fraction read as 55, RVSP of 41 mmHg, moderate tricuspid regurgitation, moderate mitral valve regurgitation - Complete echo ordered to assess current ejection fraction - Patient may benefit from optimizing her heart failure medication should there be a reduced ejection fraction on echo, including Farxiga -Telemetry cardiac, observation  Urinary retention - Status post Foley catheter placement on 06/26/2021 in the emergency department, patient was discharged back to facility - Presumed secondary to large stool burden creating a mass effect - Treat  stool burden  Pyuria - Pyuria present on admission - Patient has a Foley in place for urinary  retention that was placed about 1 week ago so she was not able to describe dysuria or urinary urgency - Repeat UA along with a urine culture ordered - Treat prophylactically with ceftriaxone 1 g daily for 5 days following repeat urine analysis - Discussed with nursing staff - Discussed with patient and her niece at bedside the reasoning for Foley removal and reinsertion  Insulin dependent type 2 diabetes mellitus (HCC) - Resume home long-acting insulin, glargine 10 units nightly - Insulin SSI with agents coverage ordered - A1c on 02/18/2021 was 10.3 - Check A1c in the a.m. - Goal inpatient blood glucose level is 140-180  HTN (hypertension) - Resumed home Imdur 30 mg daily, metolazone 2.5 mg daily, metoprolol tartrate 50 mg daily  Anxiety with depression - Mirtazapine 15 mg nightly resumed  Chronic constipation - Status post attempted fecal disimpaction by EDP, per EDP note unable to reach stool burden - Status post MiraLAX 34 g p.o., milk of magnesia, sorbitol, mineral oil enema per ED to order - Continue MiraLAX p.o. daily  GERD without esophagitis - Famotidine 20 mg nightly resumed  Sick sinus syndrome (HCC) - Status post cardiac device implantation - Resumed home digoxin 0.125 mg daily  Chart reviewed.   DVT prophylaxis: Enoxaparin Code Status: DNR/DNI Diet: Dysphagia 2 Family Communication: Updated niece at bedside Disposition Plan: Pending clinical course Consults called: None at this time Admission status: Telemetry cardiac, observation  Past Medical History:  Diagnosis Date   A-fib (HCC)    Anemia    Arthritis    Breast cancer (HCC) 1978   left breast with lymph node removal   CHF (congestive heart failure) (HCC)    Diabetes mellitus without complication (HCC)    Diverticulitis    Dysrhythmia    GERD (gastroesophageal reflux disease)    Hyperlipidemia    Macular degeneration of both eyes    Mitral valve prolapse    Mitral valve regurgitation    Presence  of permanent cardiac pacemaker    Past Surgical History:  Procedure Laterality Date   ABDOMINAL HYSTERECTOMY     APPENDECTOMY     BREAST SURGERY     CARDIAC CATHETERIZATION     ESOPHAGEAL DILATION     EYE SURGERY Bilateral    Cataract Extraction with IOL   INSERT / REPLACE / REMOVE PACEMAKER     IRRIGATION AND DEBRIDEMENT HEMATOMA Left 08/21/2015   Procedure: IRRIGATION AND DEBRIDEMENT HEMATOMA;  Surgeon: Earline Mayotte, MD;  Location: [facility] ORS;  Service: General;  Laterality: Left;   KNEE ARTHROSCOPY Right    LEFT OOPHORECTOMY Left    MASTECTOMY Left 1978   MASTECTOMY Right 1978   OPEN REDUCTION INTERNAL FIXATION (ORIF) DISTAL RADIAL FRACTURE Right 02/14/2018   Procedure: OPEN REDUCTION INTERNAL FIXATION (ORIF) DISTAL RADIAL FRACTURE;  Surgeon: Kennedy Bucker, MD;  Location: [facility] ORS;  Service: Orthopedics;  Laterality: Right;   PACEMAKER INSERTION  08/11/12   PPM GENERATOR CHANGEOUT N/A 05/02/2020   Procedure: PPM GENERATOR CHANGEOUT;  Surgeon: Marcina Millard, MD;  Location: [facility] INVASIVE CV LAB;  Service: Cardiovascular;  Laterality: N/A;   TEMPORAL ARTERY BIOPSY / LIGATION     TONSILLECTOMY     Social History:  reports that she has never smoked. She has never used smokeless tobacco. She reports that she does not drink alcohol and does not use drugs.  Allergies  Allergen Reactions   Codeine Nausea  Only   Disopyramide     Other reaction(s): Unknown   Ibuprofen Diarrhea   Iodine     blisters   Metformin And Related Other (See Comments)    unknown   Norpace [Disopyramide Phosphate]    Quinidine     Other reaction(s): Unknown   Terfenadine     Other reaction(s): Unknown   Topiramate     Other reaction(s): Other (See Comments) Hair loss   Verapamil     Other reaction(s): Unknown   Dexamethasone Sodium Phosphate Palpitations   Family History  Problem Relation Age of Onset   Hypertension Mother    Family history: Family history reviewed and not  pertinent  Prior to Admission medications   Medication Sig Start Date End Date Taking? Authorizing Provider  digoxin (LANOXIN) 0.125 MG tablet Take 1 tablet (0.125 mg total) by mouth every other day. CHF- * Hold for HR < 60 Patient taking differently: Take 0.125 mg by mouth daily. 05/10/18  Yes Sharee Holster, NP  famotidine (PEPCID) 20 MG tablet Take 20 mg by mouth at bedtime. 08/24/19  Yes [provider]  furosemide (LASIX) 20 MG tablet Take 20 mg by mouth daily.   Yes [provider]  glipiZIDE (GLUCOTROL) 10 MG tablet Take 10 mg by mouth daily.   Yes [provider]  insulin glargine-yfgn (SEMGLEE) 100 UNIT/ML injection Inject 0.1 mLs (10 Units total) into the skin daily. Patient taking differently: Inject 10 Units into the skin every evening. 04/30/21  Yes Leeroy Bock, MD  isosorbide mononitrate (IMDUR) 30 MG 24 hr tablet Take 30 mg by mouth daily. 06/05/19  Yes [provider]  metolazone (ZAROXOLYN) 2.5 MG tablet Take 2.5 mg by mouth daily.   Yes [provider]  metoprolol tartrate (LOPRESSOR) 50 MG tablet Take 50 mg by mouth daily.   Yes [provider]  mirtazapine (REMERON) 15 MG tablet Take 15 mg by mouth at bedtime.   Yes [provider]  polyethylene glycol (MIRALAX / GLYCOLAX) 17 g packet Take 17 g by mouth daily. 04/29/21  Yes Leeroy Bock, MD  acetaminophen (TYLENOL) 325 MG tablet Take 2 tablets (650 mg total) by mouth every 6 (six) hours as needed for mild pain. 04/29/21   Leeroy Bock, MD  metoprolol succinate (TOPROL-XL) 50 MG 24 hr tablet Take 1 tablet (50 mg total) by mouth daily. Patient not taking: Reported on 06/26/2021 05/10/18   Sharee Holster, NP  nitroGLYCERIN (NITROSTAT) 0.4 MG SL tablet Place 0.4 mg under the tongue every 5 (five) minutes as needed for chest pain. 03/08/19   [provider]  Polyethyl Glycol-Propyl Glycol (SYSTANE) 0.4-0.3 % SOLN Place 1 drop into both eyes as  needed (dry eyes).    [provider]  potassium chloride SA (KLOR-CON M) 20 MEQ tablet Take 1 tablet (20 mEq total) by mouth daily for 4 days. 06/26/21 06/30/21  Chesley Noon, MD   Physical Exam: Vitals:   07/01/21 1815 07/01/21 2034 07/01/21 2146 07/01/21 2314  BP:  (!) 147/58  (!) 118/50  Pulse:  60  74  Resp:  20  18  Temp:      TempSrc:      SpO2:  94%  100%  Weight:   53.5 kg   Height: 5\' 4"  (1.626 m)      Constitutional: appears age appropriate, frail, NAD, calm, comfortable Eyes: PERRL, lids and conjunctivae normal ENMT: Mucous membranes are moist. Posterior pharynx clear of any exudate or  lesions. Age-appropriate dentition. Mild hearing appropriate loss Neck: normal, supple, no masses, no thyromegaly Respiratory: clear to auscultation bilaterally, no wheezing, no crackles. Normal respiratory effort. No accessory muscle use.  Cardiovascular: Regular rate and rhythm, no murmurs / rubs / gallops. 1-2 Bilateral lower extremity pitting edema. 2+ pedal pulses. No carotid bruits.  Abdomen: no tenderness, no masses palpated, no hepatosplenomegaly. Bowel sounds positive.  Musculoskeletal: no clubbing / cyanosis. No joint deformity upper and lower extremities. Good ROM, no contractures, no atrophy. Normal muscle tone.  Skin: no rashes, lesions, ulcers. No induration Neurologic: Sensation intact. Strength 5/5 in all 4.  Psychiatric: Normal judgment and insight. Alert and oriented x 3. Normal mood.   EKG: independently reviewed, showing V paced rhythm with rate of 64, QTc 486  Chest x-ray on Admission: I personally reviewed and I agree with radiologist reading as below.  CT ABDOMEN PELVIS W CONTRAST  Result Date: 07/01/2021 CLINICAL DATA:  Abdominal distension and fluid retention, initial encounter EXAM: CT ABDOMEN AND PELVIS WITH CONTRAST TECHNIQUE: Multidetector CT imaging of the abdomen and pelvis was performed using the standard protocol following bolus administration of  intravenous contrast. RADIATION DOSE REDUCTION: This exam was performed according to the departmental dose-optimization program which includes automated exposure control, adjustment of the mA and/or kV according to patient size and/or use of iterative reconstruction technique. CONTRAST:  75mL OMNIPAQUE IOHEXOL 300 MG/ML  SOLN COMPARISON:  06/30/15 FINDINGS: Lower chest: No acute abnormality. Chronic cardiomegaly is noted. Left atrium is significantly enlarged. Coronary calcifications are noted. Hepatobiliary: Fatty infiltration of the liver is noted. Gallbladder is well distended with dependent gallstones. No complicating factors are seen. Pancreas: Unremarkable. No pancreatic ductal dilatation or surrounding inflammatory changes. Spleen: Normal in size without focal abnormality. Adrenals/Urinary Tract: Adrenal glands are within normal limits. Kidneys demonstrate a normal enhancement pattern with the exception of an area of decreased enhancement in the lower pole of the right kidney best seen on image number 41 of series 2 and image number 25 of series 7. This likely represents a focal area of ischemia. Scattered small renal cysts are noted which require no formal follow-up. No obstructive changes are seen. The bladder is decompressed by Foley catheter. Stomach/Bowel: Prominent fecal material is noted within the rectal vault which may represent early changes of rectal impaction. No obstructive changes in the colon are seen. The appendix has been surgically removed. Small bowel and stomach are within normal limits. No obstructive changes are seen. Vascular/Lymphatic: Aortic atherosclerosis. No enlarged abdominal or pelvic lymph nodes. Reproductive: Status post hysterectomy. No adnexal masses. Other: No abdominal wall hernia or abnormality. No abdominopelvic ascites. Musculoskeletal: Osteopenia is noted with degenerative changes of lumbar spine. No acute bony abnormality is seen. IMPRESSION: Cholelithiasis without  complicating factors. Area of decreased enhancement in the lower pole of the right kidney likely representing an area of ischemia. No associated inflammatory changes are seen. Findings suggestive of early rectal impaction Fatty liver. Electronically Signed   By: Alcide Clever M.D.   On: 07/01/2021 20:33   DG Chest Portable 1 View  Result Date: 07/01/2021 CLINICAL DATA:  Shortness of breath EXAM: PORTABLE CHEST 1 VIEW COMPARISON:  04/25/2021 FINDINGS: Cardiac shadow is enlarged but stable. Pacing device is again seen and stable. The lungs are well aerated bilaterally. No focal infiltrate or sizable effusion is seen. No acute bony abnormality is noted. IMPRESSION: No active disease. Electronically Signed   By: Alcide Clever M.D.   On: 07/01/2021 19:27    Labs on Admission:  I have personally reviewed following labs  CBC: Recent Labs  Lab 06/26/21 1647 07/01/21 1821  WBC 6.1 5.2  NEUTROABS 4.0  --   HGB 13.2 12.1  HCT 40.7 38.2  MCV 82.7 84.7  PLT 218 224   Basic Metabolic Panel: Recent Labs  Lab 06/26/21 1647 07/01/21 1821  NA 134* 135  K 2.4* 3.7  CL 88* 93*  CO2 33* 32  GLUCOSE 120* 216*  BUN 42* 24*  CREATININE 0.84 0.87  CALCIUM 9.1 9.4   GFR: Estimated Creatinine Clearance: 32.7 mL/min (by C-G formula based on SCr of 0.87 mg/dL).  CBG: Recent Labs  Lab 06/26/21 1940 06/26/21 2008 07/01/21 2130  GLUCAP 64* 92 158*   Urine analysis:    Component Value Date/Time   COLORURINE YELLOW (A) 07/01/2021 1821   APPEARANCEUR HAZY (A) 07/01/2021 1821   LABSPEC 1.011 07/01/2021 1821   PHURINE 7.0 07/01/2021 1821   GLUCOSEU 150 (A) 07/01/2021 1821   HGBUR MODERATE (A) 07/01/2021 1821   BILIRUBINUR NEGATIVE 07/01/2021 1821   KETONESUR NEGATIVE 07/01/2021 1821   PROTEINUR NEGATIVE 07/01/2021 1821   NITRITE NEGATIVE 07/01/2021 1821   LEUKOCYTESUR LARGE (A) 07/01/2021 1821   Dr. Sedalia Muta [facility]  If 7PM-7AM, please contact overnight-coverage provider If 7AM-7PM,  please contact day coverage provider [email or web address]  07/02/2021, 1:34 AM

## 2021-07-01 NOTE — ED Provider Notes (Signed)
? ?[facility] ?Provider Note ? ? ? Event Date/Time  ? First MD Initiated Contact with Patient 07/01/21 1824   ?  (approximate) ? ? ?History  ? ?Shortness of Breath ? ? ?HPI ? ?Taylan Mayhan is a 88 y.o. female who presents to the ED for evaluation of Shortness of Breath ?  ?I reviewed DC summary from 1/24.  She was admitted for 4 days due to metabolic encephalopathy.  History of CHF, left bundle and pacemaker in place.  DM,.  Digoxin. ?ED visit 5 days ago due to abdominal pain and urinary retention, treated with indwelling Foley catheter. ? ?Patient returns to the ED with her Foley catheter in place, complaining of pain and distention.  Continues to drain yellow urine.  Denies fevers, cough, chest pain, but is reporting orthopnea and positional shortness of breath.  Reports associated increased lower extremity edema. ? ?Denies any falls, trauma or injuries. ? ? ?Physical Exam  ? ?Triage Vital Signs: ?ED Triage Vitals  ?Enc Vitals Group  ?   BP 07/01/21 1814 (!) 132/55  ?   Pulse Rate 07/01/21 1814 70  ?   Resp 07/01/21 1814 13  ?   Temp 07/01/21 1814 98.1 ?F (36.7 ?C)  ?   Temp Source 07/01/21 1814 Oral  ?   SpO2 07/01/21 1814 98 %  ?   Weight --   ?   Height 07/01/21 1815 '5\' 4"'$  (1.626 m)  ?   Head Circumference --   ?   Peak Flow --   ?   Pain Score 07/01/21 1815 0  ?   Pain Loc --   ?   Pain Edu? --   ?   Excl. in [place]? --   ? ? ?Most recent vital signs: ?Vitals:  ? 07/01/21 1814 07/01/21 2034  ?BP: (!) 132/55 (!) 147/58  ?Pulse: 70 60  ?Resp: 13 20  ?Temp: 98.1 ?F (36.7 ?C)   ?SpO2: 98% 94%  ? ? ?General: Awake, no distress.  Frail and thin. ?CV:  Good peripheral perfusion. RRR ?Resp:  Normal effort.  Faint bibasilar crackles.  No wheezing. ?Abd:  Minimal lower abdominal distention.  No tension.  Diffuse lower abdominal tenderness without localizing features, guarding or peritoneal features.  Upper abdomen is benign. ?Foley catheter draining clear yellow urine. ?MSK:  No deformity noted.   ?Neuro:  No focal deficits appreciated. ?Other:   ? ? ?ED Results / Procedures / Treatments  ? ?Labs ?(all labs ordered are listed, but only abnormal results are displayed) ?Labs Reviewed  ?BASIC METABOLIC PANEL - Abnormal; Notable for the following components:  ?    Result Value  ? Chloride 93 (*)   ? Glucose, Bld 216 (*)   ? BUN 24 (*)   ? All other components within normal limits  ?URINALYSIS, ROUTINE W REFLEX MICROSCOPIC - Abnormal; Notable for the following components:  ? Color, Urine YELLOW (*)   ? APPearance HAZY (*)   ? Glucose, UA 150 (*)   ? Hgb urine dipstick MODERATE (*)   ? Leukocytes,Ua LARGE (*)   ? WBC, UA >50 (*)   ? Bacteria, UA RARE (*)   ? Non Squamous Epithelial PRESENT (*)   ? All other components within normal limits  ?BRAIN NATRIURETIC PEPTIDE - Abnormal; Notable for the following components:  ? B Natriuretic Peptide 364.1 (*)   ? All other components within normal limits  ?CBC  ?LACTIC ACID, PLASMA  ?CBG MONITORING, ED  ? ? ?  EKG ?Paced rhythm, rate of 64 bpm.  Appropriate axis and intervals with LBBB morphology.  No ischemic features per Sgarbossa criteria. ? ?RADIOLOGY ?1 view CXR reviewed by me with cardiomegaly without significant acute cardiopulmonary pathologies. ?CT abdomen/pelvis reviewed by me with fecal impaction ? ?Official radiology report(s): ?CT ABDOMEN PELVIS W CONTRAST ? ?Result Date: 07/01/2021 ?CLINICAL DATA:  Abdominal distension and fluid retention, initial encounter EXAM: CT ABDOMEN AND PELVIS WITH CONTRAST TECHNIQUE: Multidetector CT imaging of the abdomen and pelvis was performed using the standard protocol following bolus administration of intravenous contrast. RADIATION DOSE REDUCTION: This exam was performed according to the departmental dose-optimization program which includes automated exposure control, adjustment of the mA and/or kV according to patient size and/or use of iterative reconstruction technique. CONTRAST:  72m OMNIPAQUE IOHEXOL 300 MG/ML  SOLN  COMPARISON:  06/30/15 FINDINGS: Lower chest: No acute abnormality. Chronic cardiomegaly is noted. Left atrium is significantly enlarged. Coronary calcifications are noted. Hepatobiliary: Fatty infiltration of the liver is noted. Gallbladder is well distended with dependent gallstones. No complicating factors are seen. Pancreas: Unremarkable. No pancreatic ductal dilatation or surrounding inflammatory changes. Spleen: Normal in size without focal abnormality. Adrenals/Urinary Tract: Adrenal glands are within normal limits. Kidneys demonstrate a normal enhancement pattern with the exception of an area of decreased enhancement in the lower pole of the right kidney best seen on image number 41 of series 2 and image number 25 of series 7. This likely represents a focal area of ischemia. Scattered small renal cysts are noted which require no formal follow-up. No obstructive changes are seen. The bladder is decompressed by Foley catheter. Stomach/Bowel: Prominent fecal material is noted within the rectal vault which may represent early changes of rectal impaction. No obstructive changes in the colon are seen. The appendix has been surgically removed. Small bowel and stomach are within normal limits. No obstructive changes are seen. Vascular/Lymphatic: Aortic atherosclerosis. No enlarged abdominal or pelvic lymph nodes. Reproductive: Status post hysterectomy. No adnexal masses. Other: No abdominal wall hernia or abnormality. No abdominopelvic ascites. Musculoskeletal: Osteopenia is noted with degenerative changes of lumbar spine. No acute bony abnormality is seen. IMPRESSION: Cholelithiasis without complicating factors. Area of decreased enhancement in the lower pole of the right kidney likely representing an area of ischemia. No associated inflammatory changes are seen. Findings suggestive of early rectal impaction Fatty liver. Electronically Signed   By: MInez CatalinaM.D.   On: 07/01/2021 20:33  ? ?DG Chest Portable 1  View ? ?Result Date: 07/01/2021 ?CLINICAL DATA:  Shortness of breath EXAM: PORTABLE CHEST 1 VIEW COMPARISON:  04/25/2021 FINDINGS: Cardiac shadow is enlarged but stable. Pacing device is again seen and stable. The lungs are well aerated bilaterally. No focal infiltrate or sizable effusion is seen. No acute bony abnormality is noted. IMPRESSION: No active disease. Electronically Signed   By: MInez CatalinaM.D.   On: 07/01/2021 19:27   ? ?PROCEDURES and INTERVENTIONS: ? ?.1-3 Lead EKG Interpretation ?Performed by: SVladimir Crofts MD ?Authorized by: SVladimir Crofts MD  ? ?  Interpretation: normal   ?  ECG rate:  60 ?  ECG rate assessment: normal   ?  Rhythm: paced   ?  Ectopy: none   ?  Conduction: normal   ?Fecal disimpaction ? ?Date/Time: 07/01/2021 9:10 PM ?Performed by: SVladimir Crofts MD ?Authorized by: SVladimir Crofts MD  ?Consent: Verbal consent obtained. ?Risks and benefits: risks, benefits and alternatives were discussed ?Consent given by: patient and guardian ?Patient tolerance: patient tolerated the procedure well with  no immediate complications ? ? ? ?Medications  ?furosemide (LASIX) injection 60 mg (has no administration in time range)  ?polyethylene glycol (MIRALAX / GLYCOLAX) packet 34 g (has no administration in time range)  ?sorbitol, milk of mag, mineral oil, glycerin (SMOG) enema (has no administration in time range)  ?iohexol (OMNIPAQUE) 300 MG/ML solution 75 mL (75 mLs Intravenous Contrast Given 07/01/21 2017)  ?fentaNYL (SUBLIMAZE) injection 50 mcg (50 mcg Intravenous Given 07/01/21 2053)  ? ? ? ?IMPRESSION / MDM / ASSESSMENT AND PLAN / ED COURSE  ?I reviewed the triage vital signs and the nursing notes. ? ?88 year old woman presents to the ED with lower abdominal pain and orthopnea with evidence of fecal impaction and mild CHF exacerbation, ultimately requiring medical observation admission.  Normal vitals on room air.  Exam with lower abdominal tenderness diffusely without peritoneal features.  Blood work  with mild hyperglycemia without acidosis.  Renal function intact and CBC is normal.  No evidence of sepsis or systemic illness.  Urine does show pyuria, but I suspect is colonizers from her catheter.  Urinalysis

## 2021-07-01 NOTE — Assessment & Plan Note (Addendum)
-   cont home long-acting insulin, glargine 10 units nightly ?--SSI ?

## 2021-07-01 NOTE — ED Triage Notes (Addendum)
Pt to ED via [facility] with complaints of fluid retention, and abd distention. Family reports that she was here last week for urine retention, and they placed a foley in her it has been draining. Her legs are swollen and her family thinks her abd is distended. Pt denies any pain in her abd. When she lay down in bed she feels that she cant catch her breath ?

## 2021-07-01 NOTE — ED Notes (Signed)
Elizabeth Novak (niece) contact info: [phone] ?

## 2021-07-01 NOTE — Assessment & Plan Note (Addendum)
-   Status post cardiac device implantation ?- cont home digoxin 0.125 mg daily ?

## 2021-07-01 NOTE — ED Triage Notes (Signed)
Pt via EMS from [facility]. Pt c/o urinary retention and bilateral leg swelling. Foley appears to be draining. Family is not happy with the care she is receiving at [facility]. Pt is A&Ox4 and NAD.  ? ?208 CBG  ?118/64 BP  ?99% on RA  ?

## 2021-07-01 NOTE — Assessment & Plan Note (Deleted)
-   Status post furosemide 60 mg per EDP ?- Last echo was done in 01/31/2019, with LV ejection fraction read as 55, RVSP of 41 mmHg, moderate tricuspid regurgitation, moderate mitral valve regurgitation ?- Complete echo ordered to assess current ejection fraction ?- Patient may benefit from optimizing her heart failure medication should there be a reduced ejection fraction on echo, including [place] ?-Telemetry cardiac, observation ?

## 2021-07-01 NOTE — Assessment & Plan Note (Addendum)
-   Status post attempted fecal disimpaction by EDP, per EDP note unable to reach stool burden ?- Status post MiraLAX 34 g p.o., milk of magnesia, sorbitol, mineral oil enema per ED to order with multiple BM's achieved.  ?Plan: ?- Continue MiraLAX p.o. daily ?

## 2021-07-01 NOTE — Assessment & Plan Note (Addendum)
-   Famotidine 20 mg nightly  ?

## 2021-07-01 NOTE — ED Notes (Addendum)
See triage notes for additional details, pt to ED for urinary retention and Leg swelling. Pt was seen last week for urinary retention, indwelling catheter placed. Catheter currently draining. Pt lives at [facility], per niece, [facility] to provide catheter care and daughter is afraid pt has infection r/t catheter.  ?Pt has pitting edema present bilateral lower extremities as well as abdominal distention, nontender. Denies hx of CHF.  ?Pt states she has SOB while laying down. Denies CP ?Per niece pt has had diarrhea for the past couple of days. Denies recent fevers.  ?

## 2021-07-01 NOTE — Hospital Course (Signed)
[name] is a 88 year old female with history of hypertension, hyperlipidemia, insulin-dependent diabetes mellitus, dementia, GERD, chronic constipation, urinary retention status post Foley placement on 06/26/2021, who presents emergency department for chief concerns of shortness of breath. ? ?Initial vitals in the emergency department showed temperature of 98.1, respiration rate of 13, heart rate of 70, blood pressure of 132/55, SPO2 98% on room air. ? ?Serum sodium 135, potassium 3.7, chloride 93, bicarb 23, BUN of 24, serum creatinine of 0.87, GFR greater than 60, nonfasting blood glucose 216, WBC 5.2, hemoglobin 12.1, platelets of 224. ? ?BNP elevated at 364.  Lactic acid was 1.3.  UA was positive for large leukocytes. ? ?CT abdomen and pelvis with contrast was ordered in the ED and was read as: Cholelithiasis without complicating factors.  Area of decreased enhancement in the lower pole of the right kidney representing area of ischemia.  Findings suggestive of early rectal impaction. ? ?ED treatment: Attempted bedside manual fecal disimpaction without success.  Fentanyl 50 mcg, milk of magnesia, GlycoLax, furosemide 60 mg IV one-time dose. ?

## 2021-07-01 NOTE — Assessment & Plan Note (Addendum)
-   Mirtazapine 15 mg nightly  ?

## 2021-07-02 ENCOUNTER — Observation Stay
Admit: 2021-07-02 | Discharge: 2021-07-02 | Disposition: A | Discharge status: Home / Self Care | Payer: Medicare Other | Attending: Internal Medicine | Admitting: Internal Medicine

## 2021-07-02 ENCOUNTER — Encounter: Payer: Self-pay | Admitting: Internal Medicine

## 2021-07-02 DIAGNOSIS — R339 Retention of urine, unspecified: Secondary | ICD-10-CM | POA: Diagnosis present

## 2021-07-02 DIAGNOSIS — R410 Disorientation, unspecified: Secondary | ICD-10-CM

## 2021-07-02 DIAGNOSIS — R8281 Pyuria: Secondary | ICD-10-CM | POA: Diagnosis present

## 2021-07-02 DIAGNOSIS — I5033 Acute on chronic diastolic (congestive) heart failure: Secondary | ICD-10-CM | POA: Diagnosis not present

## 2021-07-02 LAB — URINALYSIS, COMPLETE (UACMP) WITH MICROSCOPIC
Bacteria, UA: NONE SEEN
Bilirubin Urine: NEGATIVE
Glucose, UA: 50 mg/dL — AB
Ketones, ur: NEGATIVE mg/dL
Nitrite: NEGATIVE
Protein, ur: NEGATIVE mg/dL
RBC / HPF: >50 RBC/hpf — ABNORMAL HIGH (ref 0–5)
Specific Gravity, Urine: 1.012 (ref 1.005–1.030)
pH: 7 (ref 5.0–8.0)

## 2021-07-02 LAB — BASIC METABOLIC PANEL
Anion gap: 11 (ref 5–15)
BUN: 20 mg/dL (ref 8–23)
CO2: 31 mmol/L (ref 22–32)
Calcium: 9 mg/dL (ref 8.9–10.3)
Chloride: 93 mmol/L — ABNORMAL LOW (ref 98–111)
Creatinine, Ser: 0.79 mg/dL (ref 0.44–1.00)
GFR, Estimated: >60 mL/min (ref >60)
Glucose, Bld: 194 mg/dL — ABNORMAL HIGH (ref 70–99)
Potassium: 2.9 mmol/L — ABNORMAL LOW (ref 3.5–5.1)
Sodium: 135 mmol/L (ref 135–145)

## 2021-07-02 LAB — ECHOCARDIOGRAM COMPLETE
AR max vel: 2.63 cm2
AV Area VTI: 3.03 cm2
AV Area mean vel: 2.96 cm2
AV Mean grad: 1 mmHg
AV Peak grad: 1.7 mmHg
Ao pk vel: 0.65 m/s
Area-P 1/2: 2.81 cm2
Height: 64 in
MV M vel: 5.68 m/s
MV Peak grad: 129 mmHg
MV VTI: 1.49 cm2
S' Lateral: 3 cm
Weight: 1888 oz

## 2021-07-02 LAB — CBC
HCT: 40.6 % (ref 36.0–46.0)
Hemoglobin: 13 g/dL (ref 12.0–15.0)
MCH: 27.2 pg (ref 26.0–34.0)
MCHC: 32 g/dL (ref 30.0–36.0)
MCV: 84.9 fL (ref 80.0–100.0)
Platelets: 202 10*3/uL (ref 150–400)
RBC: 4.78 MIL/uL (ref 3.87–5.11)
RDW: 15.4 % (ref 11.5–15.5)
WBC: 7.1 10*3/uL (ref 4.0–10.5)
nRBC: 0 % (ref 0.0–0.2)

## 2021-07-02 LAB — HEMOGLOBIN A1C
Hgb A1c MFr Bld: 7.8 % — ABNORMAL HIGH (ref 4.8–5.6)
Mean Plasma Glucose: 177.16 mg/dL

## 2021-07-02 LAB — GLUCOSE, CAPILLARY
Glucose-Capillary: 194 mg/dL — ABNORMAL HIGH (ref 70–99)
Glucose-Capillary: 181 mg/dL — ABNORMAL HIGH (ref 70–99)

## 2021-07-02 LAB — CBG MONITORING, ED
Glucose-Capillary: 122 mg/dL — ABNORMAL HIGH (ref 70–99)
Glucose-Capillary: 163 mg/dL — ABNORMAL HIGH (ref 70–99)

## 2021-07-02 MED ORDER — ISOSORBIDE MONONITRATE ER 30 MG PO TB24
30.0000 mg | ORAL_TABLET | Freq: Every day | ORAL | Status: DC
  Administered 2021-07-02 – 2021-07-03 (×2): 30 mg via ORAL
  Filled 2021-07-02 (×2): qty 1

## 2021-07-02 MED ORDER — FUROSEMIDE 10 MG/ML IJ SOLN
40.0000 mg | Freq: Two times a day (BID) | INTRAMUSCULAR | Status: DC
  Administered 2021-07-03: 40 mg via INTRAVENOUS
  Filled 2021-07-02: qty 4

## 2021-07-02 MED ORDER — DIGOXIN 125 MCG PO TABS
0.1250 mg | ORAL_TABLET | Freq: Every day | ORAL | Status: DC
  Administered 2021-07-02 – 2021-07-03 (×2): 0.125 mg via ORAL
  Filled 2021-07-02 (×2): qty 1

## 2021-07-02 MED ORDER — FUROSEMIDE 40 MG PO TABS
20.0000 mg | ORAL_TABLET | Freq: Every day | ORAL | Status: DC
Start: 2021-07-02 — End: 2021-07-02

## 2021-07-02 MED ORDER — QUETIAPINE FUMARATE 25 MG PO TABS
25.0000 mg | ORAL_TABLET | Freq: Every day | ORAL | Status: DC
  Administered 2021-07-02: 25 mg via ORAL
  Filled 2021-07-02: qty 1

## 2021-07-02 MED ORDER — POTASSIUM CHLORIDE 20 MEQ PO PACK
40.0000 meq | PACK | ORAL | Status: AC
  Administered 2021-07-02 (×2): 40 meq via ORAL
  Filled 2021-07-02 (×2): qty 2

## 2021-07-02 MED ORDER — POTASSIUM CHLORIDE CRYS ER 20 MEQ PO TBCR: 40.0000 meq | EXTENDED_RELEASE_TABLET | ORAL | Status: DC

## 2021-07-02 MED ORDER — METOLAZONE 2.5 MG PO TABS
2.5000 mg | ORAL_TABLET | Freq: Every day | ORAL | Status: DC
  Administered 2021-07-02 – 2021-07-03 (×2): 2.5 mg via ORAL
  Filled 2021-07-02 (×2): qty 1

## 2021-07-02 MED ORDER — METOPROLOL TARTRATE 50 MG PO TABS
50.0000 mg | ORAL_TABLET | Freq: Every day | ORAL | Status: DC
Start: 2021-07-02 — End: 2021-07-03
  Administered 2021-07-02 – 2021-07-03 (×2): 50 mg via ORAL
  Filled 2021-07-02 (×2): qty 1

## 2021-07-02 NOTE — Assessment & Plan Note (Addendum)
-   cont home Imdur 30 mg daily, metolazone 2.5 mg daily, metoprolol tartrate 50 mg daily ?--cont IV lasix ?
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
## 2021-07-02 NOTE — Assessment & Plan Note (Signed)
Hospital delirium ?--start seroquel 25 mg nightly ?

## 2021-07-02 NOTE — Assessment & Plan Note (Addendum)
-   Status post Foley catheter placement on 06/26/2021 in the emergency department, patient was discharged back to facility ?- Presumed secondary to large stool burden creating a mass effect ?Plan: ?--d/c Foley and perform voiding trial ?--bladder scan q6h to monitor for retention ?

## 2021-07-02 NOTE — ED Notes (Signed)
Informed RN bed assigned 

## 2021-07-02 NOTE — Progress Notes (Addendum)
Patient insisted on taking a shower tonight, but declines any assistance from RN and refuses safety precautions. Patient states "I can never do anything alone in here" and "I don't need your help, get out of my room". Educated about the risks of falling and safety concerns. Floor mat in place. Sharion Settler NP notified, telesitter ordered and implemented.  ?

## 2021-07-02 NOTE — Progress Notes (Signed)
?  Progress Note ? ? ?Patient: Elizabeth Novak ZHY:[id number] DOB: 1923/12/17 DOA: 07/01/2021     0 ?DOS: the patient was seen and examined on 07/02/2021 ?  ?Brief hospital course: ?[name] is a 88 year old female with history of hypertension, hyperlipidemia, insulin-dependent diabetes mellitus, dementia, GERD, chronic constipation, urinary retention status post Foley placement on 06/26/2021, who presents emergency department for chief concerns of shortness of breath. ? ?Initial vitals in the emergency department showed temperature of 98.1, respiration rate of 13, heart rate of 70, blood pressure of 132/55, SPO2 98% on room air. ? ?Serum sodium 135, potassium 3.7, chloride 93, bicarb 23, BUN of 24, serum creatinine of 0.87, GFR greater than 60, nonfasting blood glucose 216, WBC 5.2, hemoglobin 12.1, platelets of 224. ? ?BNP elevated at 364.  Lactic acid was 1.3.  UA was positive for large leukocytes. ? ?CT abdomen and pelvis with contrast was ordered in the ED and was read as: Cholelithiasis without complicating factors.  Area of decreased enhancement in the lower pole of the right kidney representing area of ischemia.  Findings suggestive of early rectal impaction. ? ?ED treatment: Attempted bedside manual fecal disimpaction without success.  Fentanyl 50 mcg, milk of magnesia, GlycoLax, furosemide 60 mg IV one-time dose. ? ?Assessment and Plan: ?* Acute on chronic diastolic heart failure (HCC) ?- reported orthopnea, and BLE swelling ?--Status post furosemide 60 mg per EDP ?- current Echo LVEF 60-65% ?Plan: ?--cont IV lasix 40 ? ?Delirium ?Hospital delirium ?--start seroquel 25 mg nightly ? ?Urinary retention ?- Status post Foley catheter placement on 06/26/2021 in the emergency department, patient was discharged back to facility ?- Presumed secondary to large stool burden creating a mass effect ?Plan: ?--d/c Foley and perform voiding trial ?--bladder scan q6h to monitor for retention ? ?Pyuria ?- Pyuria  present on admission.  Pt presented with Foley. ?--s/p ceftriaxone 1 g daily x1 ?Plan: ?--hold off further abx ? ?Insulin dependent type 2 diabetes mellitus (HCC) ?- cont home long-acting insulin, glargine 10 units nightly ?--SSI ? ?HTN (hypertension) ?- cont home Imdur 30 mg daily, metolazone 2.5 mg daily, metoprolol tartrate 50 mg daily ?--cont IV lasix ? ?Anxiety with depression ?- Mirtazapine 15 mg nightly  ? ?Chronic constipation ?- Status post attempted fecal disimpaction by EDP, per EDP note unable to reach stool burden ?- Status post MiraLAX 34 g p.o., milk of magnesia, sorbitol, mineral oil enema per ED to order with multiple BM's achieved.  ?Plan: ?- Continue MiraLAX p.o. daily ? ?GERD without esophagitis ?- Famotidine 20 mg nightly  ? ?Sick sinus syndrome (HCC) ?- Status post cardiac device implantation ?- cont home digoxin 0.125 mg daily ? ? ? ? ?  ? ?Subjective:  ?Pt reported not able to lie flat to sleep only when taking a nap.   Complained of Foley bothering her.  Had multiple BM's in response to enema and bowel regimen. ? ? ?Physical Exam: ? ?Constitutional: NAD, AAOx3 ?HEENT: conjunctivae and lids normal, EOMI, extremely hard-of-hearing ?CV: No cyanosis.   ?RESP: normal respiratory effort ?Extremities: 1-2+ pitting edema in BLE ?SKIN: warm, dry ?Neuro: II - XII grossly intact.   ?Psych: Normal mood and affect.  Appropriate judgement and reason ? ? ?Data Reviewed: ? ?Family Communication:  ? ?Disposition: ?Status is: Observation ? ? Planned Discharge Destination: Home ? ? ? ?Time spent: 50 minutes ? ?Author: ?Elizabeth Priestly, MD ?07/02/2021 6:22 PM ? ?For on call review [email or web address].  ?

## 2021-07-02 NOTE — Evaluation (Signed)
Physical Therapy Evaluation ?Patient Details ?Name: Elizabeth Novak ?MRN: [id number] ?DOB: 06-07-1923 ?Today's Date: 07/02/2021 ? ?History of Present Illness ? Pt is a 88 year old female with history of hypertension, hyperlipidemia, insulin-dependent diabetes mellitus, dementia, GERD, chronic constipation, urinary retention status post Foley placement on 06/26/2021, and SSS who presents emergency department for chief concerns of shortness of breath. MD assessment includes: Acute exacerbation of CHF, urinary retention, and pyuria. ?  ?Clinical Impression ? History obtained in the AM with pt and niece with pt then declining mobility assessment secondary to fatigue. Returned in PM with pt agreeable to mobility assessment. Pt required no physical assistance with any functional task and demonstrated good control and stability with both transfers and ambulation.  Pt's max amb distance before needing to return to sitting was only 30 feet this session with pt stating she was limited by fatigue.  Pt's SpO2 and HR were both WNL on room air.  Per pt and niece pt is a facility ambulator at baseline and currently presents with a significant decline from her baseline activity tolerance.  Pt will benefit from HHPT upon discharge to safely address deficits listed in patient problem list for decreased caregiver assistance and eventual return to PLOF. ?    ?   ? ?Recommendations for follow up therapy are one component of a multi-disciplinary discharge planning process, led by the attending physician.  Recommendations may be updated based on patient status, additional functional criteria and insurance authorization. ? ?Follow Up Recommendations Home health PT ? ?  ?Assistance Recommended at Discharge Frequent or constant Supervision/Assistance  ?Patient can return home with the following ? A little help with walking and/or transfers;A little help with bathing/dressing/bathroom;Assistance with cooking/housework;Direct supervision/assist  for medications management;Direct supervision/assist for financial management;Assist for transportation ? ?  ?Equipment Recommendations None recommended by PT  ?Recommendations for Other Services ?    ?  ?Functional Status Assessment Patient has had a recent decline in their functional status and demonstrates the ability to make significant improvements in function in a reasonable and predictable amount of time.  ? ?  ?Precautions / Restrictions Precautions ?Precautions: Fall ?Restrictions ?Weight Bearing Restrictions: No  ? ?  ? ?Mobility ? Bed Mobility ?Overal bed mobility: Modified Independent ?Bed Mobility: Supine to Sit, Sit to Supine ?  ?  ?Supine to sit: Modified independent (Device/Increase time) ?Sit to supine: Modified independent (Device/Increase time) ?  ?General bed mobility comments: Min extra time, effort, and use of bed rails only ?  ? ?Transfers ?Overall transfer level: Needs assistance ?Equipment used: Rolling walker (2 wheels) ?Transfers: Sit to/from Stand ?Sit to Stand: Supervision ?  ?  ?  ?  ?  ?General transfer comment: Good eccentric and concentric control and stability ?  ? ?Ambulation/Gait ?Ambulation/Gait assistance: Supervision ?Gait Distance (Feet): 30 Feet ?Assistive device: Rolling walker (2 wheels) ?Gait Pattern/deviations: Step-through pattern, Decreased step length - right, Decreased step length - left ?Gait velocity: decreased ?  ?  ?General Gait Details: Slow cadence but steady with short B step length, max amb distance 30 feet this session with pt stating being limited by fatigue ? ?Stairs ?  ?  ?  ?  ?  ? ?Wheelchair Mobility ?  ? ?Modified Rankin (Stroke Patients Only) ?  ? ?  ? ?Balance Overall balance assessment: Needs assistance ?Sitting-balance support: Bilateral upper extremity supported, Feet unsupported ?Sitting balance-Leahy Scale: Good ?  ?  ?Standing balance support: Bilateral upper extremity supported, During functional activity ?Standing balance-Leahy Scale: Good ?   ?  ?  ?  ?  ?  ?  ?  ?  ?  ?  ?  ?   ? ? ? ?  Pertinent Vitals/Pain Pain Assessment ?Pain Assessment: No/denies pain  ? ? ?Home Living Family/patient expects to be discharged to:: Assisted living ?  ?  ?  ?  ?  ?  ?  ?  ?Home Equipment: Agricultural consultant (2 wheels) ?Additional Comments: Pt with h/o dementia with niece present to assist with history although pt was a fairly accurate historian needing only minimal assist from her niece  ?  ?Prior Function Prior Level of Function : Needs assist ?  ?  ?  ?Physical Assist : ADLs (physical) ?  ?ADLs (physical): Bathing;Dressing ?Mobility Comments: Mod Ind amb with a RW facility distances, no fall history ?ADLs Comments: Staff assists with ADLs ?  ? ? ?Hand Dominance  ? Dominant Hand: Left ? ?  ?Extremity/Trunk Assessment  ? Upper Extremity Assessment ?Upper Extremity Assessment: Generalized weakness ?  ? ?Lower Extremity Assessment ?Lower Extremity Assessment: Generalized weakness ?  ? ?   ?Communication  ? Communication: No difficulties  ?Cognition Arousal/Alertness: Awake/alert ?Behavior During Therapy: [facility] for tasks assessed/performed ?Overall Cognitive Status: History of cognitive impairments - at baseline ?  ?  ?  ?  ?  ?  ?  ?  ?  ?  ?  ?  ?  ?  ?  ?  ?  ?  ?  ? ?  ?General Comments   ? ?  ?Exercises    ? ?Assessment/Plan  ?  ?PT Assessment Patient needs continued PT services  ?PT Problem List Decreased strength;Decreased activity tolerance;Decreased mobility;Decreased knowledge of use of DME ? ?   ?  ?PT Treatment Interventions DME instruction;Gait training;Functional mobility training;Therapeutic activities;Therapeutic exercise;Balance training;Patient/family education   ? ?PT Goals (Current goals can be found in the Care Plan section)  ?Acute Rehab PT Goals ?Patient Stated Goal: To return home ?PT Goal Formulation: With patient ?Time For Goal Achievement: 07/15/21 ?Potential to Achieve Goals: Good ? ?  ?Frequency Min 2X/week ?  ? ? ?Co-evaluation   ?  ?  ?  ?  ? ? ?   ?AM-PAC PT "6 Clicks" Mobility  ?Outcome Measure Help needed turning from your back to your side while in a flat bed without using bedrails?: A Little ?Help needed moving from lying on your back to sitting on the side of a flat bed without using bedrails?: A Little ?Help needed moving to and from a bed to a chair (including a wheelchair)?: A Little ?Help needed standing up from a chair using your arms (e.g., wheelchair or bedside chair)?: A Little ?Help needed to walk in hospital room?: A Little ?Help needed climbing 3-5 steps with a railing? : A Little ?6 Click Score: 18 ? ?  ?End of Session Equipment Utilized During Treatment: Gait belt ?Activity Tolerance: Patient tolerated treatment well ?Patient left: in bed;with call bell/phone within reach;Other (comment) (both rails up in ED bed) ?Nurse Communication: Mobility status ?PT Visit Diagnosis: Difficulty in walking, not elsewhere classified (R26.2);Muscle weakness (generalized) (M62.81) ?  ? ?Time: 4098-1191 (and 15:29-15:47) ?PT Time Calculation (min) (ACUTE ONLY): 16 min ? ? ?Charges:   PT Evaluation ?$PT Eval Moderate Complexity: 1 Mod ?  ?  ?   ?D. Elly Modena PT, DPT ?07/02/21, 4:28 PM ? ? ?

## 2021-07-02 NOTE — ED Notes (Signed)
Secure msg sent to Elizabeth Plume, RN for ED to [employer]. ?

## 2021-07-02 NOTE — Progress Notes (Incomplete)
07/02/21 ?1:58 PM  ? ?Elizabeth Novak ?Nov 18, 1923 ?[id number] ? ?Referring provider:  ?[facility] ?[address] ?[place],  [place] [address] ?No chief complaint on file. ? ? ? ?HPI: ?Elizabeth Novak is a 88 y.o.female who presents today for a voiding trial.  ? ?She was seen in the ED on 06/26/2021 for abdominal pain in her suprapubic area with inability to urinate and some burning. She has tenderness in her suprapubic area with  to be firm and distended bladder. Bladder scan was significantly elevated and Foley catheter placed with return of greater than 800 cc of clear yellow urine. Urinalysis was not suspicious for infection. BMP was remarkable for elevated BUN as well as hypokalemia. She was discharged with a foley.  ? ?She was seen again in the ED on 07/01/2021 for pain and distention with Foley still in place. CT abdomen and pelvis visualized area of decreased enhancement in the lower pole of the right kidney likely representing an area of ischemia. No associated inflammatory changes are seen. [place] showed pyuria which Dr suspected was colonization form her catheter.  ? ? ? ? ?PMH: ?Past Medical History:  ?Diagnosis Date  ? A-fib (HCC)   ? Anemia   ? Arthritis   ? Breast cancer (HCC) 1978  ? left breast with lymph node removal  ? CHF (congestive heart failure) (HCC)   ? Diabetes mellitus without complication (HCC)   ? Diverticulitis   ? Dysrhythmia   ? GERD (gastroesophageal reflux disease)   ? Hyperlipidemia   ? Macular degeneration of both eyes   ? Mitral valve prolapse   ? Mitral valve regurgitation   ? Presence of permanent cardiac pacemaker   ? ? ?Surgical History: ?Past Surgical History:  ?Procedure Laterality Date  ? ABDOMINAL HYSTERECTOMY    ? APPENDECTOMY    ? BREAST SURGERY    ? CARDIAC CATHETERIZATION    ? ESOPHAGEAL DILATION    ? EYE SURGERY Bilateral   ? Cataract Extraction with IOL  ? INSERT / REPLACE / REMOVE PACEMAKER    ? IRRIGATION AND DEBRIDEMENT HEMATOMA  Left 08/21/2015  ? Procedure: IRRIGATION AND DEBRIDEMENT HEMATOMA;  Surgeon: Earline Mayotte, MD;  Location: [facility] ORS;  Service: General;  Laterality: Left;  ? KNEE ARTHROSCOPY Right   ? LEFT OOPHORECTOMY Left   ? MASTECTOMY Left 1978  ? MASTECTOMY Right 1978  ? OPEN REDUCTION INTERNAL FIXATION (ORIF) DISTAL RADIAL FRACTURE Right 02/14/2018  ? Procedure: OPEN REDUCTION INTERNAL FIXATION (ORIF) DISTAL RADIAL FRACTURE;  Surgeon: Kennedy Bucker, MD;  Location: [facility] OR[facility];  Service: Orthopedics;  Laterality: Right;  ? PACEMAKER INSERTION  08/11/12  ? PPM GENERATOR CHANGEOUT N/A 05/02/2020  ? Procedure: PPM GENERATOR CHANGEOUT;  Surgeon: Marcina Millard, MD;  Location: [facility] INVASIVE CV [facility];  Service: Cardiovascular;  Laterality: N/A;  ? TEMPORAL ARTERY BIOPSY / LIGATION    ? TONSILLECTOMY    ? ? ?Home Medications:  ?Allergies as of 07/03/2021   ? ?   Reactions  ? Codeine Nausea Only  ? Disopyramide   ? Other reaction(s): Unknown  ? Ibuprofen Diarrhea  ? Iodine   ? blisters  ? Metformin And Related Other (See Comments)  ? unknown  ? Norpace [disopyramide Phosphate]   ? Quinidine   ? Other reaction(s): Unknown  ? Terfenadine   ? Other reaction(s): Unknown  ? Topiramate   ? Other reaction(s): Other (See Comments) ?Hair loss  ? Verapamil   ? Other reaction(s): Unknown  ? Dexamethasone Sodium  Phosphate Palpitations  ? ?  ? ?  ?Medication List  ?  ? ? Notice   ?This visit is during an admission. Changes to the med list made in this visit will be reflected in the After Visit Summary of the admission. ?  ? ? ?Allergies:  ?Allergies  ?Allergen Reactions  ? Codeine Nausea Only  ? Disopyramide   ?  Other reaction(s): Unknown  ? Ibuprofen Diarrhea  ? Iodine   ?  blisters  ? Metformin And Related Other (See Comments)  ?  unknown  ? Norpace [Disopyramide Phosphate]   ? Quinidine   ?  Other reaction(s): Unknown  ? Terfenadine   ?  Other reaction(s): Unknown  ? Topiramate   ?  Other reaction(s): Other (See Comments) ?Hair loss  ?  Verapamil   ?  Other reaction(s): Unknown  ? Dexamethasone Sodium Phosphate Palpitations  ? ? ?Family History: ?Family History  ?Problem Relation Age of Onset  ? Hypertension Mother   ? ? ?Social History:  reports that she has never smoked. She has never used smokeless tobacco. She reports that she does not drink alcohol and does not use drugs. ? ? ?Physical Exam: ?There were no vitals taken for this visit.  ?Constitutional:  Alert and oriented, No acute distress. ?HEENT: [place] AT, moist mucus membranes.  Trachea midline, no masses. ?Cardiovascular: No clubbing, cyanosis, or edema. ?Respiratory: Normal respiratory effort, no increased work of breathing. ?GI: Abdomen is soft, nontender, nondistended, no abdominal masses ?GU: No CVA tenderness ?Lymph: No cervical or inguinal lymphadenopathy. ?Skin: No rashes, bruises or suspicious lesions. ?Neurologic: Grossly intact, no focal deficits, moving all 4 extremities. ?Psychiatric: Normal mood and affect. ? ? ?Laboratory Data: ?Lab Results  ?Component Value Date  ? CREATININE 0.79 07/02/2021  ? ?Lab Results  ?Component Value Date  ? HGBA1C 7.8 (H) 07/02/2021  ? ?Component ?    Latest Ref Rng 07/02/2021  ?Sodium ?    135 - 145 mmol/L 135   ?Potassium ?    3.5 - 5.1 mmol/L 2.9 (L)   ?Chloride ?    98 - 111 mmol/L 93 (L)   ?CO2 ?    22 - 32 mmol/L 31   ?Glucose ?    70 - 99 mg/dL 161 (H)   ?BUN ?    8 - 23 mg/dL 20   ?Creatinine ?    0.44 - 1.00 mg/dL 0.96   ?Calcium ?    8.9 - 10.3 mg/dL 9.0   ?Anion gap ?    5 - 15  11   ?GFR, Estimated ?    >60 mL/min >60   ?  ?(L) Low ?(H) High ? ?Urinalysis ? ? ?Pertinent Imaging: ? ? ?Assessment & Plan:   ? ? ?No follow-ups on file. ? ?[facility] ?[address] ?[place], [place] [address] ?[phone] ? ?I,Kailey Littlejohn,acting as a Neurosurgeon for [employer], PA-C.,have documented all relevant documentation on the behalf of SHANNON MCGOWAN, PA-C,as directed by  [facility], PA-C while in the  presence of SHANNON MCGOWAN, PA-C. ?

## 2021-07-02 NOTE — Progress Notes (Signed)
PT Cancellation Note ? ?Patient Details ?Name: Elizabeth Novak ?MRN: [id number] ?DOB: [birth year] ? ? ?Cancelled Treatment:    Reason Eval/Treat Not Completed: Patient declined despite encouragement to participate with PT services this date secondary to fatigue.  Will attempt to see pt at a future date/time as medically appropriate.  ? ? ?D. Royetta Asal PT, DPT ?07/02/21, 11:19 AM ? ?

## 2021-07-02 NOTE — ED Notes (Signed)
Pt resting comfortably at this time. Pt denies needs at this time. Foley draining with no issues at this time. Call bell in reach. ? ?Admission nurse at bedside.  ?

## 2021-07-02 NOTE — ED Notes (Signed)
Echo at bedside

## 2021-07-02 NOTE — Progress Notes (Signed)
Patient up to chair for dinner. Patient instructed to call RN when ready to go back to bed.  ?Patient got up and chair alarm went off. Patient had large episode of bowel incontinence.  Patient refusing all RN assistance. Patient stated, "I have been doing this for 90 years now. I think I can manage without you". Patient educated that foley catheter is in place and I was here to help make sure it is clean and that it does not get tangled and accidentally removed. RN stood by for supervision and safety precautions in place.  ?

## 2021-07-02 NOTE — Evaluation (Addendum)
Occupational Therapy Evaluation ?Patient Details ?Name: Elizabeth Novak ?MRN: [id number] ?DOB: 06-09-1923 ?Today's Date: 07/02/2021 ? ? ?History of Present Illness Pt is a 88 year old female with history of hypertension, hyperlipidemia, insulin-dependent diabetes mellitus, dementia, GERD, chronic constipation, urinary retention status post Foley placement on 06/26/2021, and SSS who presents emergency department for chief concerns of shortness of breath. MD assessment includes: Acute exacerbation of CHF, urinary retention, and pyuria.  ? ?Clinical Impression ?  ?Elizabeth Novak was seen for OT evaluation this date. Prior to hospital admission, pt was MOD I for mobility using RW including walking to dining hall. Pt lives at memory care unit. Pt presents to acute OT demonstrating impaired ADL performance and functional mobility 2/2 decreased activity tolerance and functional strength/balance deficits. Pt currently MOD I for bed mobility. SBA + RW sit<>stand and hair brushing standing sink side, pt tolerated ~30 ft mobility. MOD I for UBD in sitting. Pt would benefit from skilled OT to address noted impairments and functional limitations (see below for any additional details). Upon hospital discharge, recommend HHOT to maximize pt safety and return to PLOF. ?  ? ?Recommendations for follow up therapy are one component of a multi-disciplinary discharge planning process, led by the attending physician.  Recommendations may be updated based on patient status, additional functional criteria and insurance authorization.  ? ?Follow Up Recommendations ? Home health OT  ?  ?Assistance Recommended at Discharge Set up Supervision/Assistance  ?Patient can return home with the following A little help with walking and/or transfers;A little help with bathing/dressing/bathroom ? ?  ?Functional Status Assessment ? Patient has had a recent decline in their functional status and demonstrates the ability to make significant improvements in  function in a reasonable and predictable amount of time.  ?Equipment Recommendations ? None recommended by OT  ?  ?Recommendations for Other Services   ? ? ?  ?Precautions / Restrictions Precautions ?Precautions: Fall ?Restrictions ?Weight Bearing Restrictions: No  ? ?  ? ?Mobility Bed Mobility ?Overal bed mobility: Needs Assistance, Modified Independent ?  ?  ?  ?  ?  ?  ?  ?  ? ?Transfers ?Overall transfer level: Needs assistance ?Equipment used: Rolling walker (2 wheels) ?Transfers: Sit to/from Stand ?Sit to Stand: Supervision ?  ?  ?  ?  ?  ?General transfer comment: assist for foley mgmt, pt with good awareness ?  ? ?  ?Balance Overall balance assessment: Needs assistance ?Sitting-balance support: No upper extremity supported, Feet supported ?Sitting balance-Leahy Scale: Good ?  ?  ?Standing balance support: No upper extremity supported, During functional activity ?Standing balance-Leahy Scale: Fair ?  ?  ?  ?  ?  ?  ?  ?  ?  ?  ?  ?  ?   ? ?ADL either performed or assessed with clinical judgement  ? ?ADL Overall ADL's : Needs assistance/impaired ?  ?  ?  ?  ?  ?  ?  ?  ?  ?  ?  ?  ?  ?  ?  ?  ?  ?  ?  ?General ADL Comments: SBA + RW for ADL t/f and grooming standing sink side. MOD I for UBD in sitting  ? ? ? ? ?Pertinent Vitals/Pain Pain Assessment ?Pain Assessment: No/denies pain  ? ? ? ?Hand Dominance Left ?  ?Extremity/Trunk Assessment Upper Extremity Assessment ?Upper Extremity Assessment: Generalized weakness ?  ?Lower Extremity Assessment ?Lower Extremity Assessment: Generalized weakness ?  ?  ?  ?Communication Communication ?Communication: No  difficulties ?  ?Cognition Arousal/Alertness: Awake/alert ?Behavior During Therapy: [facility] for tasks assessed/performed ?Overall Cognitive Status: History of cognitive impairments - at baseline ?  ?  ?  ?  ?  ?  ?  ?  ?  ?  ?  ?  ?  ?  ?  ?  ?  ?  ?  ?General Comments  SpO2 90s with mobility ? ?  ? ?Home Living Family/patient expects to be discharged to:: Assisted  living ?  ?  ?  ?  ?  ?  ?  ?  ?  ?  ?  ?  ?  ?  ?Home Equipment: Agricultural consultant (2 wheels) ?  ?Additional Comments: Pt with h/o dementia with niece present to assist with history although pt was a fairly accurate historian needing only minimal assist from her niece ?  ? ?  ?Prior Functioning/Environment Prior Level of Function : Needs assist ?  ?  ?  ?Physical Assist : ADLs (physical) ?  ?ADLs (physical): Bathing;Dressing ?Mobility Comments: Mod Ind amb with a RW facility distances, no fall history ?ADLs Comments: Staff assists with ADLs ?  ? ?  ?  ?OT Problem List: Decreased activity tolerance;Decreased strength;Impaired balance (sitting and/or standing) ?  ?   ?OT Treatment/Interventions: Self-care/ADL training;Therapeutic exercise;Energy conservation;DME and/or AE instruction;Therapeutic activities;Patient/family education;Balance training  ?  ?OT Goals(Current goals can be found in the care plan section) Acute Rehab OT Goals ?Patient Stated Goal: to go home ?OT Goal Formulation: With patient ?Time For Goal Achievement: 07/16/21 ?Potential to Achieve Goals: Good ?ADL Goals ?Pt Will Perform Grooming: with modified independence;standing ?Pt Will Perform Lower Body Dressing: with modified independence;sit to/from stand ?Pt Will Transfer to Toilet: with modified independence;ambulating;regular height toilet  ?OT Frequency: Min 2X/week ?  ? ?Co-evaluation   ?  ?  ?  ?  ? ?  ?AM-PAC OT "6 Clicks" Daily Activity     ?Outcome Measure Help from another person eating meals?: None ?Help from another person taking care of personal grooming?: A Little ?Help from another person toileting, which includes using toliet, bedpan, or urinal?: A Little ?Help from another person bathing (including washing, rinsing, drying)?: A Little ?Help from another person to put on and taking off regular upper body clothing?: None ?Help from another person to put on and taking off regular lower body clothing?: A Little ?6 Click Score: 20 ?  ?End  of Session Equipment Utilized During Treatment: Rolling walker (2 wheels) ? ?Activity Tolerance: Patient tolerated treatment well ?Patient left: in bed;with call bell/phone within reach ? ?OT Visit Diagnosis: Other abnormalities of gait and mobility (R26.89);Muscle weakness (generalized) (M62.81)  ?              ?Time: 4098-1191 ?OT Time Calculation (min): 21 min ?Charges:  OT General Charges ?$OT Visit: 1 Visit ?OT Evaluation ?$OT Eval Low Complexity: 1 Low ?OT Treatments ?$Self Care/Home Management : 8-22 mins ? ?Kathie Dike, M.S. OTR/L  ?07/02/21, 4:31 PM  ?ascom [phone] ? ?

## 2021-07-02 NOTE — Progress Notes (Signed)
Foley catheter removed at 18:20. Will pass off to [facility] RN to bladder scan post-void or if unable to void per orders.  ?

## 2021-07-02 NOTE — Care Management Obs Status (Signed)
MEDICARE OBSERVATION STATUS NOTIFICATION ? ? ?Patient Details  ?Name: Elizabeth Novak ?MRN: [id number] ?Date of Birth: 1924-01-06 ? ? ?Medicare Observation Status Notification Given:  Yes ? ? ? ?Shelbie Hutching, RN ?07/02/2021, 12:35 PM ?

## 2021-07-02 NOTE — Assessment & Plan Note (Addendum)
-   Pyuria present on admission.  Pt presented with Foley. ?--s/p ceftriaxone 1 g daily x1 ?Plan: ?--hold off further abx ?

## 2021-07-02 NOTE — Progress Notes (Signed)
*  PRELIMINARY RESULTS* ?Echocardiogram ?2D Echocardiogram has been performed. ? ?Elizabeth Novak, [name] ?07/02/2021, 9:21 AM ?

## 2021-07-02 NOTE — Assessment & Plan Note (Signed)
-   reported orthopnea, and BLE swelling ?--Status post furosemide 60 mg per EDP ?- current Echo LVEF 60-65% ?Plan: ?--cont IV lasix 40 ?

## 2021-07-02 NOTE — Progress Notes (Signed)
Admission profile updated ? ?Patient ambulated to toilet with handheld assist. ?

## 2021-07-02 NOTE — ED Notes (Signed)
Pt to bedside commode to have a medium sized BM.  ?

## 2021-07-03 ENCOUNTER — Ambulatory Visit: Payer: Medicare Other | Admitting: Urology

## 2021-07-03 DIAGNOSIS — R0600 Dyspnea, unspecified: Secondary | ICD-10-CM | POA: Diagnosis present

## 2021-07-03 DIAGNOSIS — Z950 Presence of cardiac pacemaker: Secondary | ICD-10-CM | POA: Diagnosis not present

## 2021-07-03 DIAGNOSIS — E785 Hyperlipidemia, unspecified: Secondary | ICD-10-CM | POA: Diagnosis present

## 2021-07-03 DIAGNOSIS — Z888 Allergy status to other drugs, medicaments and biological substances status: Secondary | ICD-10-CM | POA: Diagnosis not present

## 2021-07-03 DIAGNOSIS — K219 Gastro-esophageal reflux disease without esophagitis: Secondary | ICD-10-CM | POA: Diagnosis present

## 2021-07-03 DIAGNOSIS — Z885 Allergy status to narcotic agent status: Secondary | ICD-10-CM | POA: Diagnosis not present

## 2021-07-03 DIAGNOSIS — E119 Type 2 diabetes mellitus without complications: Secondary | ICD-10-CM | POA: Diagnosis present

## 2021-07-03 DIAGNOSIS — I4891 Unspecified atrial fibrillation: Secondary | ICD-10-CM | POA: Diagnosis present

## 2021-07-03 DIAGNOSIS — F0394 Unspecified dementia, unspecified severity, with anxiety: Secondary | ICD-10-CM | POA: Diagnosis present

## 2021-07-03 DIAGNOSIS — E876 Hypokalemia: Secondary | ICD-10-CM | POA: Diagnosis present

## 2021-07-03 DIAGNOSIS — R8281 Pyuria: Secondary | ICD-10-CM | POA: Diagnosis present

## 2021-07-03 DIAGNOSIS — T502X5A Adverse effect of carbonic-anhydrase inhibitors, benzothiadiazides and other diuretics, initial encounter: Secondary | ICD-10-CM | POA: Diagnosis present

## 2021-07-03 DIAGNOSIS — Z8249 Family history of ischemic heart disease and other diseases of the circulatory system: Secondary | ICD-10-CM | POA: Diagnosis not present

## 2021-07-03 DIAGNOSIS — F32A Depression, unspecified: Secondary | ICD-10-CM | POA: Diagnosis present

## 2021-07-03 DIAGNOSIS — K5641 Fecal impaction: Secondary | ICD-10-CM | POA: Diagnosis present

## 2021-07-03 DIAGNOSIS — I5033 Acute on chronic diastolic (congestive) heart failure: Secondary | ICD-10-CM | POA: Diagnosis present

## 2021-07-03 DIAGNOSIS — Z9109 Other allergy status, other than to drugs and biological substances: Secondary | ICD-10-CM | POA: Diagnosis not present

## 2021-07-03 DIAGNOSIS — Z66 Do not resuscitate: Secondary | ICD-10-CM | POA: Diagnosis present

## 2021-07-03 DIAGNOSIS — K8020 Calculus of gallbladder without cholecystitis without obstruction: Secondary | ICD-10-CM | POA: Diagnosis present

## 2021-07-03 DIAGNOSIS — F05 Delirium due to known physiological condition: Secondary | ICD-10-CM | POA: Diagnosis not present

## 2021-07-03 DIAGNOSIS — Z853 Personal history of malignant neoplasm of breast: Secondary | ICD-10-CM | POA: Diagnosis not present

## 2021-07-03 DIAGNOSIS — I110 Hypertensive heart disease with heart failure: Secondary | ICD-10-CM | POA: Diagnosis present

## 2021-07-03 DIAGNOSIS — F419 Anxiety disorder, unspecified: Secondary | ICD-10-CM | POA: Diagnosis present

## 2021-07-03 DIAGNOSIS — F0393 Unspecified dementia, unspecified severity, with mood disturbance: Secondary | ICD-10-CM | POA: Diagnosis present

## 2021-07-03 DIAGNOSIS — I495 Sick sinus syndrome: Secondary | ICD-10-CM | POA: Diagnosis present

## 2021-07-03 LAB — CBC
HCT: 36.3 % (ref 36.0–46.0)
Hemoglobin: 11.7 g/dL — ABNORMAL LOW (ref 12.0–15.0)
MCH: 27.2 pg (ref 26.0–34.0)
MCHC: 32.2 g/dL (ref 30.0–36.0)
MCV: 84.4 fL (ref 80.0–100.0)
Platelets: 200 10*3/uL (ref 150–400)
RBC: 4.3 MIL/uL (ref 3.87–5.11)
RDW: 15.4 % (ref 11.5–15.5)
WBC: 4.8 10*3/uL (ref 4.0–10.5)
nRBC: 0 % (ref 0.0–0.2)

## 2021-07-03 LAB — BASIC METABOLIC PANEL
Anion gap: 8 (ref 5–15)
BUN: 23 mg/dL (ref 8–23)
CO2: 33 mmol/L — ABNORMAL HIGH (ref 22–32)
Calcium: 8.9 mg/dL (ref 8.9–10.3)
Chloride: 97 mmol/L — ABNORMAL LOW (ref 98–111)
Creatinine, Ser: 0.75 mg/dL (ref 0.44–1.00)
GFR, Estimated: >60 mL/min (ref >60)
Glucose, Bld: 116 mg/dL — ABNORMAL HIGH (ref 70–99)
Potassium: 3 mmol/L — ABNORMAL LOW (ref 3.5–5.1)
Sodium: 138 mmol/L (ref 135–145)

## 2021-07-03 LAB — MAGNESIUM: Magnesium: 2.1 mg/dL (ref 1.7–2.4)

## 2021-07-03 LAB — URINE CULTURE: Culture: <10000 — AB

## 2021-07-03 LAB — GLUCOSE, CAPILLARY
Glucose-Capillary: 124 mg/dL — ABNORMAL HIGH (ref 70–99)
Glucose-Capillary: 102 mg/dL — ABNORMAL HIGH (ref 70–99)
Glucose-Capillary: 253 mg/dL — ABNORMAL HIGH (ref 70–99)

## 2021-07-03 MED ORDER — INSULIN GLARGINE-YFGN 100 UNIT/ML ~~LOC~~ SOLN: 10.0000 [IU] | Freq: Every evening | SUBCUTANEOUS | Status: DC

## 2021-07-03 MED ORDER — POTASSIUM CHLORIDE 20 MEQ PO PACK
40.0000 meq | PACK | ORAL | Status: AC
  Administered 2021-07-03 (×2): 40 meq via ORAL
  Filled 2021-07-03 (×2): qty 2

## 2021-07-03 MED ORDER — METOLAZONE 2.5 MG PO TABS: 2.5000 mg | ORAL_TABLET | Freq: Every day | ORAL | Status: DC | PRN

## 2021-07-03 MED ORDER — POTASSIUM CHLORIDE CRYS ER 20 MEQ PO TBCR
20.0000 meq | EXTENDED_RELEASE_TABLET | Freq: Every day | ORAL | 0 refills | Status: DC
Start: 2021-07-03 — End: 2022-02-05

## 2021-07-03 NOTE — Discharge Summary (Addendum)
? ?Physician Discharge Summary ? ? ?Elizabeth Novak  female DOB: [birth year]  ?ZOX:[id number] ? ?PCP: [facility] ? ?Admit date: 07/01/2021 ?Discharge date: 07/03/2021 ? ?Admitted From: ALF ?Disposition:  ALF ?Home Health: Yes ?CODE STATUS: Full code ? ? ?Hospital Course:  ?For full details, please see H&P, progress notes, consult notes and ancillary notes.  ?Briefly,  ?Elizabeth Novak is a 88 year old female with history of hypertension, insulin-dependent diabetes mellitus, dementia, chronic constipation, urinary retention status post Foley placement on 06/26/2021, who presented emergency department for chief concerns of shortness of breath. ? ?CT abdomen and pelvis with contrast was ordered in the ED and was read as: Cholelithiasis without complicating factors.  Area of decreased enhancement in the lower pole of the right kidney representing area of ischemia.  Findings suggestive of early rectal impaction. ?  ?* Acute on chronic diastolic heart failure (HCC) ?-reported orthopnea, and BLE swelling.  BNP 364.  Pt last saw cardiology Dr. Lady Gary on 05/22/21 in clinic, and home med list on the clinic note listed torsemide 20 mg daily a metolazone PRN, however, unclear if pt was taking them PTA. ?--Pt was diuresed with IV lasix 60 mg then 40 mg daily with improvement in her dyspnea.   ?- current Echo LVEF 60-65% ?--Pt is discharged on torsemide 20 mg daily a metolazone PRN. ?  ?Hospital delirium ? ?Urinary retention, resolved ?- Foley catheter placed on 06/26/2021 in the emergency department.  Pt presented with Foley. ?- Retention presumed secondary to large stool burden creating a mass effect ?--Foley was d/c'ed during this hospitalization and pt was able to void on her own. ?  ?Pyuria ?- Pyuria present on admission.  Pt presented with Foley. ?--s/p ceftriaxone 1 g daily x1 ?--hold off further abx since pt denied dysuria after Foley was removed. ?  ?Insulin dependent type 2 diabetes  mellitus (HCC) ?-A1c 7.8, which is acceptable for pt's age. ?--cont home long-acting insulin, glargine 10 units nightly and SSI while inpatient. ?--Home glipizide held during hospitalization and resumed after discharge. ?  ?HTN (hypertension) ?- cont home Imdur 30 mg daily, Toprol 50 mg daily, and torsemide 20 mg daily. ?  ?Anxiety with depression ?-cont home Mirtazapine 15 mg nightly  ?  ?Chronic constipation ?- attempted fecal disimpaction by EDP, unsuccessful.   ?- Status post MiraLAX 34 g p.o., milk of magnesia, sorbitol, mineral oil enema per ED to order with multiple BM's achieved.  ?- Continue MiraLAX p.o. daily or BID to ensure daily BM. ?  ?GERD without esophagitis ?- cont home Famotidine 20 mg nightly and home PPI ?  ?Sick sinus syndrome (HCC) ?- Status post cardiac device implantation ?- cont home digoxin 0.125 mg every other day ? ?Hypokalemia ?--likely associated with diuretic use.  Monitored and repleted PRN while inpatient.   ?--pt is discharged on potassium 20 mEq daily (was on 10 mg TID PTA).   ?--follow up with PCP to check potassium level 1 week after discharge. ? ? ?Discharge Diagnoses:  ?Principal Problem: ?  Acute on chronic diastolic heart failure (HCC) ?Active Problems: ?  Sick sinus syndrome (HCC) ?  GERD without esophagitis ?  Chronic constipation ?  Anxiety with depression ?  HTN (hypertension) ?  HLD (hyperlipidemia) ?  Insulin dependent type 2 diabetes mellitus (HCC) ?  Pyuria ?  Urinary retention ?  Delirium ?  Dyspnea ? ? ? ? ?Discharge Instructions: ? ?Allergies as of 07/03/2021   ? ?   Reactions  ? Codeine  Nausea Only  ? Disopyramide   ? Other reaction(s): Unknown  ? Ibuprofen Diarrhea  ? Iodine   ? blisters  ? Metformin And Related Other (See Comments)  ? unknown  ? Norpace [disopyramide Phosphate]   ? Quinidine   ? Other reaction(s): Unknown  ? Terfenadine   ? Other reaction(s): Unknown  ? Topiramate   ? Other reaction(s): Other (See Comments) ?Hair loss  ? Verapamil   ? Other  reaction(s): Unknown  ? Dexamethasone Sodium Phosphate Palpitations  ? ?  ? ?  ?Medication List  ?  ? ?STOP taking these medications   ? ?furosemide 20 MG tablet ?Commonly known as: LASIX ?  ?metoprolol tartrate 50 MG tablet ?Commonly known as: LOPRESSOR ?  ? ?  ? ?TAKE these medications   ? ?acetaminophen 325 MG tablet ?Commonly known as: TYLENOL ?Take 2 tablets (650 mg total) by mouth every 6 (six) hours as needed for mild pain. ?  ?digoxin 0.125 MG tablet ?Commonly known as: LANOXIN ?Take 1 tablet (0.125 mg total) by mouth every other day. CHF- * Hold for HR < 60 ?What changed:  ?when to take this ?additional instructions ?  ?famotidine 20 MG tablet ?Commonly known as: PEPCID ?Take 20 mg by mouth at bedtime. ?  ?glipiZIDE 10 MG tablet ?Commonly known as: GLUCOTROL ?Take 10 mg by mouth daily. ?  ?insulin glargine-yfgn 100 UNIT/ML injection ?Commonly known as: SEMGLEE ?Inject 0.1 mLs (10 Units total) into the skin every evening. ?  ?isosorbide mononitrate 30 MG 24 hr tablet ?Commonly known as: IMDUR ?Take 30 mg by mouth daily. ?  ?metolazone 2.5 MG tablet ?Commonly known as: ZAROXOLYN ?Take 1 tablet (2.5 mg total) by mouth daily as needed. Home med. ?What changed:  ?when to take this ?reasons to take this ?additional instructions ?  ?metoprolol succinate 50 MG 24 hr tablet ?Commonly known as: TOPROL-XL ?Take 1 tablet (50 mg total) by mouth daily. ?  ?mirtazapine 15 MG tablet ?Commonly known as: REMERON ?Take 15 mg by mouth at bedtime. ?  ?nitroGLYCERIN 0.4 MG SL tablet ?Commonly known as: NITROSTAT ?Place 0.4 mg under the tongue every 5 (five) minutes as needed for chest pain. ?  ?omeprazole 20 MG capsule ?Commonly known as: PRILOSEC ?Take 20 mg by mouth 2 (two) times daily before a meal. ?  ?polyethylene glycol 17 g packet ?Commonly known as: MIRALAX / GLYCOLAX ?Take 17 g by mouth daily. ?  ?potassium chloride SA 20 MEQ tablet ?Commonly known as: KLOR-CON M ?Take 1 tablet (20 mEq total) by mouth daily. ?What  changed:  ?how much to take ?when to take this ?  ?Systane 0.4-0.3 % Soln ?Generic drug: Polyethyl Glycol-Propyl Glycol ?Place 1 drop into both eyes as needed (dry eyes). ?  ?torsemide 20 MG tablet ?Commonly known as: DEMADEX ?Take 20 mg by mouth daily. ?  ? ?  ? ? ? Follow-up Information   ? ? [facility] Follow up in 1 week(s).   ?Why: check potassium level and kidney function ?Contact information: ?[address] ?[place] [address] ?[phone] ? ? ?  ?  ? ?  ?  ? ?  ? ? ?Allergies  ?Allergen Reactions  ? Codeine Nausea Only  ? Disopyramide   ?  Other reaction(s): Unknown  ? Ibuprofen Diarrhea  ? Iodine   ?  blisters  ? Metformin And Related Other (See Comments)  ?  unknown  ? Norpace [Disopyramide Phosphate]   ? Quinidine   ?  Other reaction(s): Unknown  ? Terfenadine   ?  Other reaction(s): Unknown  ? Topiramate   ?  Other reaction(s): Other (See Comments) ?Hair loss  ? Verapamil   ?  Other reaction(s): Unknown  ? Dexamethasone Sodium Phosphate Palpitations  ? ? ? ?The results of significant diagnostics from this hospitalization (including imaging, microbiology, ancillary and laboratory) are listed below for reference.  ? ?Consultations: ? ? ?Procedures/Studies: ?CT ABDOMEN PELVIS W CONTRAST ? ?Result Date: 07/01/2021 ?CLINICAL DATA:  Abdominal distension and fluid retention, initial encounter EXAM: CT ABDOMEN AND PELVIS WITH CONTRAST TECHNIQUE: Multidetector CT imaging of the abdomen and pelvis was performed using the standard protocol following bolus administration of intravenous contrast. RADIATION DOSE REDUCTION: This exam was performed according to the departmental dose-optimization program which includes automated exposure control, adjustment of the mA and/or kV according to patient size and/or use of iterative reconstruction technique. CONTRAST:  75mL OMNIPAQUE IOHEXOL 300 MG/ML  SOLN COMPARISON:  06/30/15 FINDINGS: Lower chest: No acute abnormality. Chronic  cardiomegaly is noted. Left atrium is significantly enlarged. Coronary calcifications are noted. Hepatobiliary: Fatty infiltration of the liver is noted. Gallbladder is well distended with dependent gallstones. No complica

## 2021-07-03 NOTE — Consult Note (Signed)
? ?  Heart Failure Nurse Navigator Note ? ?HFpEF 60 to 65%.  Left ventricular diastolic parameters are indeterminate.  Normal right ventricular systolic function. ? ?She presented to the emergency room complaints of shortness of breath.  She currently is residing in [facility]. ? ?Comorbidities: ? ?Sick sinus syndrome ?GERD ?Anxiety/depression ?Hypertension ?Hyperlipidemia ?Type 2 diabetes ? ? ?Medications: ? ?Digoxin 0.125 mg daily ?Furosemide 40 mg IV 2 times a day ?Isosorbide mononitrate 30 mg daily ?Metolazone 2.5 mg daily ?Metoprolol tartrate 50 mg daily ?Potassium chloride 40 mEq every 4 hours ? ? ?Labs: ? ?Sodium 138, potassium 3, chloride 97, CO2 33, BUN 23, creatinine 0.75, GFR greater than 60, magnesium 2.1 ?Weight not documented ?Intake 480 mL ?Output 1700 mL ?Blood pressure 127/69 ? ?Initial meeting with patient on this admission.  She is awake and alert sitting up in the chair at bedside in no acute distress. ? ?She states that she has heard the term heart failure before and has not used salt at the table for many many years. Also talked about the amount of liquids she drinks in a days time- she listed approximately 50 ounces. ? ?Discussed the importance of daily weights but she states they do not weigh her daily at the facility.  ? ?Made her aware of the outpatient heart failure clinic, she questions if someone that old should come?  I told yes we have other patients that are in their 90's. ? ?She was given the living with heart failure teaching booklet, zone magnet and info on sodium. ? ?She has an appointment in the outpatient heart failure clinic on April 5 at 1:30 in the afternoon.  She has a 0% no-show to appointments. ? ?Pricilla Riffle RN [facility] ?

## 2021-07-03 NOTE — Progress Notes (Signed)
PT Cancellation Note ? ?Patient Details ?Name: Elizabeth Novak ?MRN: [id number] ?DOB: 07/19/1923 ? ? ?Cancelled Treatment:    Reason Eval/Treat Not Completed: Other (comment).  Chart reviewed and attempted to see pt.  Pt actively ready for discharge and confirmed via secure chat with [facility] team.  Will follow throughout d/c and attempt again at later date/time as medically appropriate.  ? ? ?Gwenlyn Saran, PT, DPT ?07/03/21, 4:02 PM ? ?

## 2021-07-03 NOTE — Progress Notes (Signed)
Occupational Therapy Treatment ?Patient Details ?Name: Elizabeth Novak ?MRN: [id number] ?DOB: 04/04/1924 ?Today's Date: 07/03/2021 ? ? ?History of present illness Pt is a 88 year old female with history of hypertension, hyperlipidemia, insulin-dependent diabetes mellitus, dementia, GERD, chronic constipation, urinary retention status post Foley placement on 06/26/2021, and SSS who presents emergency department for chief concerns of shortness of breath. MD assessment includes: Acute exacerbation of CHF, urinary retention, and pyuria. ?  ?OT comments ? Ms Rosselot was seen for OT treatment on this date. Upon arrival to room pt seated in chair, agreeable to tx. Pt requires SBA + RW for toilet t/f and hand washing standing sink side. SUPERVISION + VCs don/doff underwear and pants - cues to ID underwear as dirty. Pt making good progress toward goals. Pt continues to benefit from skilled OT services to maximize return to PLOF and minimize risk of future falls, injury, caregiver burden, and readmission. Will continue to follow POC. Discharge recommendation remains appropriate.  ?  ? ?Recommendations for follow up therapy are one component of a multi-disciplinary discharge planning process, led by the attending physician.  Recommendations may be updated based on patient status, additional functional criteria and insurance authorization. ?   ?Follow Up Recommendations ? Home health OT  ?  ?Assistance Recommended at Discharge Set up Supervision/Assistance  ?Patient can return home with the following ? A little help with walking and/or transfers;A little help with bathing/dressing/bathroom ?  ?Equipment Recommendations ? None recommended by OT  ?  ?Recommendations for Other Services   ? ?  ?Precautions / Restrictions Precautions ?Precautions: Fall ?Restrictions ?Weight Bearing Restrictions: No  ? ? ?  ? ?Mobility Bed Mobility ?  ?  ?  ?  ?  ?  ?  ?General bed mobility comments: received and left in chair ?  ? ?Transfers ?Overall  transfer level: Needs assistance ?Equipment used: Rolling walker (2 wheels) ?Transfers: Sit to/from Stand ?Sit to Stand: Supervision ?  ?  ?  ?  ?  ?  ?  ?  ?Balance Overall balance assessment: Needs assistance ?Sitting-balance support: Feet unsupported, No upper extremity supported ?Sitting balance-Leahy Scale: Good ?  ?  ?Standing balance support: No upper extremity supported, During functional activity ?Standing balance-Leahy Scale: Good ?  ?  ?  ?  ?  ?  ?  ?  ?  ?  ?  ?  ?   ? ?ADL either performed or assessed with clinical judgement  ? ?ADL Overall ADL's : Needs assistance/impaired ?  ?  ?  ?  ?  ?  ?  ?  ?  ?  ?  ?  ?  ?  ?  ?  ?  ?  ?  ?General ADL Comments: SBA + RW for toilet t/f and hand washing standing sink side. SUPERVISION + VCs don/doff underwear and pants - cues to ID underwear as dirty ?  ? ? ? ?Cognition Arousal/Alertness: Awake/alert ?Behavior During Therapy: [facility] for tasks assessed/performed ?Overall Cognitive Status: History of cognitive impairments - at baseline ?  ?  ?  ?  ?  ?  ?  ?  ?  ?  ?  ?  ?  ?  ?  ?  ?General Comments: cues to ID dirty underwear ?  ?  ?   ?   ?   ?   ? ? ?Pertinent Vitals/ Pain       Pain Assessment ?Pain Assessment: No/denies pain ? ? ?Frequency ? Min 2X/week  ? ? ? ? ?  ?  Progress Toward Goals ? ?OT Goals(current goals can now be found in the care plan section) ? Progress towards OT goals: Progressing toward goals ? ?Acute Rehab OT Goals ?Patient Stated Goal: to go home ?OT Goal Formulation: With patient ?Time For Goal Achievement: 07/16/21 ?Potential to Achieve Goals: Good ?ADL Goals ?Pt Will Perform Grooming: with modified independence;standing ?Pt Will Perform Lower Body Dressing: with modified independence;sit to/from stand ?Pt Will Transfer to Toilet: with modified independence;ambulating;regular height toilet  ?Plan Discharge plan remains appropriate;Frequency remains appropriate   ? ?Co-evaluation ? ? ?   ?  ?  ?  ?  ? ?  ?AM-PAC OT "6 Clicks" Daily Activity      ?Outcome Measure ? ? Help from another person eating meals?: None ?Help from another person taking care of personal grooming?: A Little ?Help from another person toileting, which includes using toliet, bedpan, or urinal?: A Little ?Help from another person bathing (including washing, rinsing, drying)?: A Little ?Help from another person to put on and taking off regular upper body clothing?: None ?Help from another person to put on and taking off regular lower body clothing?: A Little ?6 Click Score: 20 ? ?  ?End of Session Equipment Utilized During Treatment: Rolling walker (2 wheels) ? ?OT Visit Diagnosis: Other abnormalities of gait and mobility (R26.89);Muscle weakness (generalized) (M62.81) ?  ?Activity Tolerance Patient tolerated treatment well ?  ?Patient Left in chair;with call bell/phone within reach;with chair alarm set ?  ?Nurse Communication Mobility status ?  ? ?   ? ?Time: 0454-0981 ?OT Time Calculation (min): 23 min ? ?Charges: OT General Charges ?$OT Visit: 1 Visit ?OT Treatments ?$Self Care/Home Management : 23-37 mins ? ?Kathie Dike, M.S. OTR/L  ?07/03/21, 10:45 AM  ?ascom [phone] ? ?

## 2021-07-03 NOTE — TOC Progression Note (Addendum)
Transition of Care (TOC) - Progression Note  ? ? ?Patient Details  ?Name: Elizabeth Novak ?MRN: [id number] ?Date of Birth: [birth year] ? ?Transition of Care (TOC) CM/SW Contact  ?Candie Chroman, LCSW ?Phone Number: ?07/03/2021, 10:47 AM ? ?Clinical Narrative:  Left voicemail for admissions coordinator at [facility].  ? ?12:02 pm: Since patient does not have a skilled need at this time, [facility] unable to bring her back to their facility. They would be happy to do so later on if she does develop skilled needs. Left voicemail at [facility] regarding return today. Also left niece [name] a Advertising account executive. ? ?1:17 pm: Spoke with [name]'s daughter [name]. They are agreeable to return to [facility] today. Waiting on them to call me back. Patient switched from DNR to full code this morning. Per RN assessment, patient only AOx2. [name] said family wants her switched back to DNR. Sent secure chat to MD and RN to notify. ? ?1:53 pm: Faxed FL2 and discharge summary to [place] at [facility]. Made home health referral to [facility] which is their preferred provider. ? ?Expected Discharge Plan: Assisted Living ?Barriers to Discharge: Continued Medical Work up ? ?Expected Discharge Plan and Services ?Expected Discharge Plan: Assisted Living ?  ?Discharge Planning Services: CM Consult ?  ?Living arrangements for the past 2 months: [place], [place] ?Expected Discharge Date: 07/03/21               ?  ?  ?  ?  ?  ?  ?  ?  ?  ?  ? ? ?Social Determinants of Health (SDOH) Interventions ?  ? ?Readmission Risk Interventions ?   ? View : No data to display.  ?  ?  ?  ? ? ?

## 2021-07-03 NOTE — TOC Progression Note (Signed)
Transition of Care (TOC) - Progression Note  ? ? ?Patient Details  ?Name: Elizabeth Novak ?MRN: [id number] ?Date of Birth: 04-07-1923 ? ?Transition of Care (TOC) CM/SW Contact  ?Shelbie Hutching, RN ?Phone Number: ?07/03/2021, 10:39 AM ? ?Clinical Narrative:    ?Message left for [name] yesterday for return call, her daughter [name] returned the call [phone].  They would like to see the patient go back to [facility] for rehab she believes she has some days left.  RNCM did explain that patient will need a qualifying hospital stay for Medicare to cover STR, patient has been switched to inpatient.   ?They may also want to consider doing long term care at [facility], they were going to do LTC at [facility] before but the patient wanted to stay on the rehab hall, she got upset when they moved her to the [place] hall so they opted to DC over to [facility].   ? ? ?Expected Discharge Plan: Assisted Living ?Barriers to Discharge: Continued Medical Work up ? ?Expected Discharge Plan and Services ?Expected Discharge Plan: Assisted Living ?  ?Discharge Planning Services: CM Consult ?  ?Living arrangements for the past 2 months: [place] ?                ?  ?  ?  ?  ?  ?  ?  ?  ?  ?  ? ? ?Social Determinants of Health (SDOH) Interventions ?  ? ?Readmission Risk Interventions ?   ? View : No data to display.  ?  ?  ?  ? ? ?
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
## 2021-07-03 NOTE — TOC Transition Note (Signed)
Transition of Care (TOC) - CM/SW Discharge Note ? ? ?Patient Details  ?Name: Elizabeth Novak ?MRN: [id number] ?Date of Birth: [birth year] ? ?Transition of Care (TOC) CM/SW Contact:  ?Candie Chroman, LCSW ?Phone Number: ?07/03/2021, 3:42 PM ? ? ?Clinical Narrative:   Patient has orders to discharge back to [facility]F today. Faxed signed FL2 to [facility] at the facility. RN has already called report. Great-niece [name] is agreeable to [facility] [employer] referral to look for alternate placement. [facility] [employer] representative is currently touring with a family and will call CSW back. EMS transport has been arranged and she is next on the list. RN will call [name] when they arrive. No further concerns. CSW signing off. ? ?Final next level of care: Assisted Living (with home health) ?Barriers to Discharge: Barriers Resolved ? ? ?Patient Goals and CMS Choice ?Patient states their goals for this hospitalization and ongoing recovery are:: not sure about [place] she may want to go back to [facility] ?  ?  ? ?Discharge Placement ?  ?           ?  ?Patient to be transferred to facility by: EMS ?Name of family member notified: Lisette Abu ?Patient and family notified of of transfer: 07/03/21 ? ?Discharge Plan and Services ?  ?Discharge Planning Services: CM Consult ?           ?  ?  ?  ?  ?  ?HH Arranged: PT, OT ?[place] Agency: [place] ?Date HH Agency Contacted: 07/03/21 ?  ?Representative spoke with at [place]: Bobbe Medico ? ?Social Determinants of Health (SDOH) Interventions ?  ? ? ?Readmission Risk Interventions ?   ? View : No data to display.  ?  ?  ?  ? ? ? ? ? ?

## 2021-07-09 ENCOUNTER — Ambulatory Visit: Payer: PRIVATE HEALTH INSURANCE | Admitting: Family

## 2021-07-10 ENCOUNTER — Ambulatory Visit: Payer: Medicare Other | Admitting: Urology

## 2021-07-25 ENCOUNTER — Inpatient Hospital Stay
Admission: EM | Admit: 2021-07-25 | Discharge: 2021-08-02 | DRG: 299 | Disposition: A | Discharge status: Home Health | Payer: Medicare Other | Source: Skilled Nursing Facility | Attending: Internal Medicine | Admitting: Internal Medicine

## 2021-07-25 ENCOUNTER — Encounter
Admission: EM | Disposition: A | Discharge status: Home Health | Payer: Self-pay | Source: Skilled Nursing Facility | Attending: Internal Medicine

## 2021-07-25 ENCOUNTER — Emergency Department: Payer: Medicare Other

## 2021-07-25 ENCOUNTER — Encounter: Payer: Self-pay | Admitting: Radiology

## 2021-07-25 DIAGNOSIS — K219 Gastro-esophageal reflux disease without esophagitis: Secondary | ICD-10-CM | POA: Diagnosis present

## 2021-07-25 DIAGNOSIS — F0393 Unspecified dementia, unspecified severity, with mood disturbance: Secondary | ICD-10-CM | POA: Diagnosis present

## 2021-07-25 DIAGNOSIS — Z66 Do not resuscitate: Secondary | ICD-10-CM | POA: Diagnosis present

## 2021-07-25 DIAGNOSIS — I4891 Unspecified atrial fibrillation: Secondary | ICD-10-CM | POA: Diagnosis present

## 2021-07-25 DIAGNOSIS — I742 Embolism and thrombosis of arteries of the upper extremities: Principal | ICD-10-CM | POA: Diagnosis present

## 2021-07-25 DIAGNOSIS — K5641 Fecal impaction: Secondary | ICD-10-CM | POA: Diagnosis present

## 2021-07-25 DIAGNOSIS — N280 Ischemia and infarction of kidney: Secondary | ICD-10-CM | POA: Diagnosis present

## 2021-07-25 DIAGNOSIS — I480 Paroxysmal atrial fibrillation: Secondary | ICD-10-CM | POA: Diagnosis present

## 2021-07-25 DIAGNOSIS — E1169 Type 2 diabetes mellitus with other specified complication: Secondary | ICD-10-CM | POA: Diagnosis present

## 2021-07-25 DIAGNOSIS — I5033 Acute on chronic diastolic (congestive) heart failure: Secondary | ICD-10-CM | POA: Diagnosis present

## 2021-07-25 DIAGNOSIS — E785 Hyperlipidemia, unspecified: Secondary | ICD-10-CM | POA: Diagnosis present

## 2021-07-25 DIAGNOSIS — K5909 Other constipation: Secondary | ICD-10-CM | POA: Diagnosis present

## 2021-07-25 DIAGNOSIS — E11649 Type 2 diabetes mellitus with hypoglycemia without coma: Secondary | ICD-10-CM | POA: Diagnosis present

## 2021-07-25 DIAGNOSIS — I38 Endocarditis, valve unspecified: Secondary | ICD-10-CM

## 2021-07-25 DIAGNOSIS — Z79899 Other long term (current) drug therapy: Secondary | ICD-10-CM

## 2021-07-25 DIAGNOSIS — Z7901 Long term (current) use of anticoagulants: Secondary | ICD-10-CM

## 2021-07-25 DIAGNOSIS — Z853 Personal history of malignant neoplasm of breast: Secondary | ICD-10-CM

## 2021-07-25 DIAGNOSIS — F418 Other specified anxiety disorders: Secondary | ICD-10-CM | POA: Diagnosis present

## 2021-07-25 DIAGNOSIS — I749 Embolism and thrombosis of unspecified artery: Secondary | ICD-10-CM | POA: Diagnosis present

## 2021-07-25 DIAGNOSIS — Z794 Long term (current) use of insulin: Secondary | ICD-10-CM

## 2021-07-25 DIAGNOSIS — I495 Sick sinus syndrome: Secondary | ICD-10-CM | POA: Diagnosis present

## 2021-07-25 DIAGNOSIS — I70208 Unspecified atherosclerosis of native arteries of extremities, other extremity: Secondary | ICD-10-CM

## 2021-07-25 DIAGNOSIS — I5032 Chronic diastolic (congestive) heart failure: Secondary | ICD-10-CM

## 2021-07-25 DIAGNOSIS — R54 Age-related physical debility: Secondary | ICD-10-CM | POA: Diagnosis present

## 2021-07-25 DIAGNOSIS — F0394 Unspecified dementia, unspecified severity, with anxiety: Secondary | ICD-10-CM | POA: Diagnosis present

## 2021-07-25 DIAGNOSIS — Z950 Presence of cardiac pacemaker: Secondary | ICD-10-CM

## 2021-07-25 DIAGNOSIS — E1165 Type 2 diabetes mellitus with hyperglycemia: Secondary | ICD-10-CM | POA: Diagnosis not present

## 2021-07-25 DIAGNOSIS — Z91041 Radiographic dye allergy status: Secondary | ICD-10-CM

## 2021-07-25 DIAGNOSIS — I425 Other restrictive cardiomyopathy: Secondary | ICD-10-CM | POA: Diagnosis present

## 2021-07-25 DIAGNOSIS — I10 Essential (primary) hypertension: Secondary | ICD-10-CM | POA: Diagnosis present

## 2021-07-25 DIAGNOSIS — E7800 Pure hypercholesterolemia, unspecified: Secondary | ICD-10-CM | POA: Diagnosis present

## 2021-07-25 DIAGNOSIS — M79601 Pain in right arm: Principal | ICD-10-CM

## 2021-07-25 DIAGNOSIS — Z90710 Acquired absence of both cervix and uterus: Secondary | ICD-10-CM

## 2021-07-25 DIAGNOSIS — Z9013 Acquired absence of bilateral breasts and nipples: Secondary | ICD-10-CM

## 2021-07-25 DIAGNOSIS — E876 Hypokalemia: Secondary | ICD-10-CM | POA: Diagnosis present

## 2021-07-25 DIAGNOSIS — Z8673 Personal history of transient ischemic attack (TIA), and cerebral infarction without residual deficits: Secondary | ICD-10-CM

## 2021-07-25 DIAGNOSIS — I509 Heart failure, unspecified: Secondary | ICD-10-CM

## 2021-07-25 DIAGNOSIS — E1151 Type 2 diabetes mellitus with diabetic peripheral angiopathy without gangrene: Secondary | ICD-10-CM | POA: Diagnosis present

## 2021-07-25 DIAGNOSIS — I110 Hypertensive heart disease with heart failure: Secondary | ICD-10-CM | POA: Diagnosis present

## 2021-07-25 DIAGNOSIS — Z7984 Long term (current) use of oral hypoglycemic drugs: Secondary | ICD-10-CM

## 2021-07-25 DIAGNOSIS — Z8249 Family history of ischemic heart disease and other diseases of the circulatory system: Secondary | ICD-10-CM

## 2021-07-25 LAB — CBC WITH DIFFERENTIAL/PLATELET
Abs Immature Granulocytes: 0.02 10*3/uL (ref 0.00–0.07)
Basophils Absolute: 0 10*3/uL (ref 0.0–0.1)
Basophils Relative: 0 %
Eosinophils Absolute: 0.1 10*3/uL (ref 0.0–0.5)
Eosinophils Relative: 3 %
HCT: 39.9 % (ref 36.0–46.0)
Hemoglobin: 12.3 g/dL (ref 12.0–15.0)
Immature Granulocytes: 0 %
Lymphocytes Relative: 19 %
Lymphs Abs: 1 10*3/uL (ref 0.7–4.0)
MCH: 27.5 pg (ref 26.0–34.0)
MCHC: 30.8 g/dL (ref 30.0–36.0)
MCV: 89.1 fL (ref 80.0–100.0)
Monocytes Absolute: 0.6 10*3/uL (ref 0.1–1.0)
Monocytes Relative: 11 %
Neutro Abs: 3.3 10*3/uL (ref 1.7–7.7)
Neutrophils Relative %: 67 %
Platelets: 245 10*3/uL (ref 150–400)
RBC: 4.48 MIL/uL (ref 3.87–5.11)
RDW: 14.1 % (ref 11.5–15.5)
WBC: 4.9 10*3/uL (ref 4.0–10.5)
nRBC: 0 % (ref 0.0–0.2)

## 2021-07-25 LAB — CBG MONITORING, ED: Glucose-Capillary: 239 mg/dL — ABNORMAL HIGH (ref 70–99)

## 2021-07-25 LAB — BASIC METABOLIC PANEL
Anion gap: 11 (ref 5–15)
BUN: 30 mg/dL — ABNORMAL HIGH (ref 8–23)
CO2: 32 mmol/L (ref 22–32)
Calcium: 9.3 mg/dL (ref 8.9–10.3)
Chloride: 98 mmol/L (ref 98–111)
Creatinine, Ser: 1.03 mg/dL — ABNORMAL HIGH (ref 0.44–1.00)
GFR, Estimated: 50 mL/min — ABNORMAL LOW (ref >60)
Glucose, Bld: 246 mg/dL — ABNORMAL HIGH (ref 70–99)
Potassium: 4.1 mmol/L (ref 3.5–5.1)
Sodium: 141 mmol/L (ref 135–145)

## 2021-07-25 SURGERY — THROMBECTOMY, ARTERY, BRACHIAL
Anesthesia: General | Laterality: Right

## 2021-07-25 MED ORDER — INSULIN GLARGINE-YFGN 100 UNIT/ML ~~LOC~~ SOLN
9.0000 [IU] | Freq: Two times a day (BID) | SUBCUTANEOUS | Status: DC
  Administered 2021-07-25: 9 [IU] via SUBCUTANEOUS
  Filled 2021-07-25 (×2): qty 0.09

## 2021-07-25 MED ORDER — VITAMIN D 25 MCG (1000 UNIT) PO TABS
2000.0000 [IU] | ORAL_TABLET | Freq: Every day | ORAL | Status: DC
  Administered 2021-07-26 – 2021-08-02 (×8): 2000 [IU] via ORAL
  Filled 2021-07-25 (×8): qty 2

## 2021-07-25 MED ORDER — HEPARIN (PORCINE) 25000 UT/250ML-% IV SOLN
850.0000 [IU]/h | INTRAVENOUS | Status: DC
  Administered 2021-07-25 – 2021-07-28 (×3): 850 [IU]/h via INTRAVENOUS
  Filled 2021-07-25 (×3): qty 250

## 2021-07-25 MED ORDER — TORSEMIDE 20 MG PO TABS
20.0000 mg | ORAL_TABLET | Freq: Every day | ORAL | Status: DC
  Administered 2021-07-26 – 2021-07-30 (×5): 20 mg via ORAL
  Filled 2021-07-25 (×5): qty 1

## 2021-07-25 MED ORDER — ONDANSETRON HCL 4 MG PO TABS: 4.0000 mg | ORAL_TABLET | Freq: Four times a day (QID) | ORAL | Status: DC | PRN

## 2021-07-25 MED ORDER — ONDANSETRON HCL 4 MG/2ML IJ SOLN: 4.0000 mg | Freq: Four times a day (QID) | INTRAMUSCULAR | Status: DC | PRN

## 2021-07-25 MED ORDER — POLYETHYLENE GLYCOL 3350 17 G PO PACK: 17.0000 g | PACK | Freq: Every day | ORAL | Status: DC | PRN

## 2021-07-25 MED ORDER — HEPARIN BOLUS VIA INFUSION
3500.0000 [IU] | Freq: Once | INTRAVENOUS | Status: AC
  Administered 2021-07-25: 3500 [IU] via INTRAVENOUS
  Filled 2021-07-25: qty 3500

## 2021-07-25 MED ORDER — ACETAMINOPHEN 650 MG RE SUPP: 650.0000 mg | Freq: Four times a day (QID) | RECTAL | Status: AC | PRN

## 2021-07-25 MED ORDER — IOHEXOL 350 MG/ML SOLN
100.0000 mL | Freq: Once | INTRAVENOUS | Status: AC | PRN
  Administered 2021-07-25: 100 mL via INTRAVENOUS

## 2021-07-25 MED ORDER — FAMOTIDINE 20 MG PO TABS
20.0000 mg | ORAL_TABLET | Freq: Every day | ORAL | Status: DC
  Administered 2021-07-26 – 2021-08-01 (×7): 20 mg via ORAL
  Filled 2021-07-25 (×8): qty 1

## 2021-07-25 MED ORDER — NITROGLYCERIN 0.4 MG SL SUBL: 0.4000 mg | SUBLINGUAL_TABLET | SUBLINGUAL | Status: DC | PRN

## 2021-07-25 MED ORDER — ISOSORBIDE MONONITRATE ER 30 MG PO TB24
30.0000 mg | ORAL_TABLET | Freq: Every day | ORAL | Status: DC
  Administered 2021-07-26 – 2021-08-02 (×8): 30 mg via ORAL
  Filled 2021-07-25 (×8): qty 1

## 2021-07-25 MED ORDER — PANTOPRAZOLE SODIUM 40 MG PO TBEC
40.0000 mg | DELAYED_RELEASE_TABLET | Freq: Every day | ORAL | Status: DC
  Administered 2021-07-26 – 2021-08-02 (×8): 40 mg via ORAL
  Filled 2021-07-25 (×8): qty 1

## 2021-07-25 MED ORDER — MIRTAZAPINE 15 MG PO TABS
15.0000 mg | ORAL_TABLET | Freq: Every day | ORAL | Status: DC
  Administered 2021-07-26 – 2021-08-01 (×7): 15 mg via ORAL
  Filled 2021-07-25 (×7): qty 1

## 2021-07-25 MED ORDER — ACETAMINOPHEN 325 MG PO TABS: 650.0000 mg | ORAL_TABLET | Freq: Four times a day (QID) | ORAL | Status: AC | PRN

## 2021-07-25 MED ORDER — DIGOXIN 125 MCG PO TABS
0.1250 mg | ORAL_TABLET | Freq: Every day | ORAL | Status: DC
  Administered 2021-07-27 – 2021-08-02 (×7): 0.125 mg via ORAL
  Filled 2021-07-25 (×7): qty 1

## 2021-07-25 NOTE — Assessment & Plan Note (Signed)
-   Resumed home mirtazapine 15 mg nightly 

## 2021-07-25 NOTE — ED Notes (Signed)
Purewick placed on patient.

## 2021-07-25 NOTE — ED Notes (Signed)
EKG handed to MD Archie Balboa ?

## 2021-07-25 NOTE — Consult Note (Signed)
ANTICOAGULATION CONSULT NOTE - Initial Consult ? ?Pharmacy Consult for heparin ?Indication: DVT ? ?Allergies  ?Allergen Reactions  ? Codeine Nausea Only  ? Disopyramide   ?  Other reaction(s): Unknown  ? Ibuprofen Diarrhea  ? Iodine   ?  blisters  ? Metformin And Related Other (See Comments)  ?  unknown  ? Norpace [Disopyramide Phosphate]   ? Quinidine   ?  Other reaction(s): Unknown  ? Terfenadine   ?  Other reaction(s): Unknown  ? Topiramate   ?  Other reaction(s): Other (See Comments) ?Hair loss  ? Verapamil   ?  Other reaction(s): Unknown  ? Dexamethasone Sodium Phosphate Palpitations  ? ? ?Patient Measurements: ?Height: '5\' 4"'$  (162.6 cm) ?Weight: 50 kg (110 lb 3.7 oz) ?IBW/kg (Calculated) : 54.7 ?Heparin Dosing Weight: 50 kg ? ?Vital Signs: ?Temp: 97.5 ?F (36.4 ?C) (04/21 1853) ?Temp Source: Oral (04/21 1853) ?BP: 134/93 (04/21 2123) ?Pulse Rate: 67 (04/21 2123) ? ?Labs: ?Recent Labs  ?  07/25/21 ?1934  ?HGB 12.3  ?HCT 39.9  ?PLT 245  ?CREATININE 1.03*  ? ? ?Estimated Creatinine Clearance: 25.8 mL/min (A) (by C-G formula based on SCr of 1.03 mg/dL (H)). ? ? ?Medical History: ?Past Medical History:  ?Diagnosis Date  ? A-fib ([place])   ? Anemia   ? Arthritis   ? Breast cancer ([place]) 1978  ? left breast with lymph node removal  ? CHF (congestive heart failure) ([place])   ? Diabetes mellitus without complication ([place])   ? Diverticulitis   ? Dysrhythmia   ? GERD (gastroesophageal reflux disease)   ? Hyperlipidemia   ? Macular degeneration of both eyes   ? Mitral valve prolapse   ? Mitral valve regurgitation   ? Presence of permanent cardiac pacemaker   ? ? ?Medications:  ?PTA:  ?Inpatient: Heparin drip  ?Allergies: No AC/APT related allergies ? ?Assessment: ?88 y.o. female with past medical history of hypertension, diabetes, CHF, atrial fibrillation, sick sinus syndrome status post pacemaker, and stroke who presents to the ED complaining of abdominal pain. Pharmacy consulted for heparin drip in the setting of DVT. ? ?4/21:  CT Angio of Right upper extremity: Concerning for possible embolus."  ? ?Date Time aPTT/HL Rate/Comment ?4/21 2200   3500u x1, 850u/hr     ? ?Baseline Labs: ?aPTT - ordered ?Hgb - 12.3 ?Plts - 245 ? ?Goal of Therapy:  ?Heparin level 0.3-0.7 units/ml ?Monitor platelets by anticoagulation protocol: Yes ?  ?Plan:  ?Give 3500 units bolus x1; then start heparin infusion at 850 units/hr ?Check anti-Xa level in 8 hours and daily once consecutively therapeutic. ?Continue to monitor H&H and platelets daily while on heparin gtt. ? ? ?Thank you for allowing pharmacy to be a part of this pleasant patient?s care. ? ?Darrick Penna, PharmD, MS PGPM ?Clinical Pharmacist ?07/25/2021 ?9:55 PM ? ? ? ?

## 2021-07-25 NOTE — H&P (Addendum)
?History and Physical  ? ?Elizabeth Novak YNW:[id number] DOB: 1923-04-09 DOA: 07/25/2021 ? ?PCP: [facility]  ?Patient coming from: facility ? ?I have personally briefly reviewed patient's old medical records in [facility] EMR. ? ?Chief Concern: right arm pain ? ?HPI: Elizabeth Novak is a 88 year old female with sick sinus syndrome, hypertension, hyperlipidemia, insulin-dependent diabetes mellitus, depression, dementia, GERD, chronic constipation, who presents emergency department for chief concerns of sudden onset of right upper extremity pain. ? ?Initial vitals in the emergency department showed temperature of 97.5, respiration rate of 18, heart rate 64, blood pressure 140/67, SPO2 99% on room air. ? ?Serum sodium is 141, potassium 4.1, chloride of 98, bicarb 32, BUN of 30, serum creatinine 1.03, GFR greater than 50, nonfasting blood glucose 246, WBC 4.9, hemoglobin 12.3, platelets of 245. ? ?CTA of the right upper extremity with and without contrast: Was read as abrupt cut off of the distal right radial artery with some reconstitution of the forearm vessels as above.  Concerning for possible embolus.  Area of decreased perfusion of the lower pole of the right kidney.  Cannot exclude embolic involvement and infarct.  Cholelithiasis. ? ?CT of the head without contrast read as no evidence of intracranial abnormality.  Small vessel ischemic changes. ? ?ED treatment: Heparin GTT was initiated.  ED provider consulted vascular who states that they would take patient to the OR for thrombectomy on night of admission. ? ?At bedside patient is able to tell me her full name, her age, and she knows she is in the hospital. ? ?She reports that she last had dinner at around 5 PM and finished dinner at approximately 5:30 PM.  She states subsequently her arm started hurting really bad and she has never had that pain before.  She describes the pain as sharp. ? ?She denies any chest pain or  shortness of breath. ? ?Social history: She denies history of tobacco, EtOH, recreational drug use.  She is currently retired and formerly worked as a Press photographer. ? ?ROS: ?Constitutional: no weight change, no fever ?ENT/Mouth: no sore throat, no rhinorrhea ?Eyes: no eye pain, no vision changes ?Cardiovascular: no chest pain, no dyspnea,  no edema, no palpitations ?Respiratory: no cough, no sputum, no wheezing ?Gastrointestinal: no nausea, no vomiting, no diarrhea, no constipation ?Genitourinary: no urinary incontinence, no dysuria, no hematuria ?Musculoskeletal: + right arm pain ?Skin: no skin lesions, no pruritus, ?Neuro: + weakness, no loss of consciousness, no syncope ?Psych: no anxiety, no depression, no decrease appetite ?Heme/Lymph: no bruising, no bleeding ? ?ED Course: Discussed with emergency medicine provider, patient requiring hospitalization for chief concerns of right upper extremity pain concerning for occlusion. ? ?Assessment/Plan ? ?Principal Problem: ?  Acute occlusion of artery of upper extremity due to thrombus [facility]) ?Active Problems: ?  Atrial fibrillation (HCC) ?  Sick sinus syndrome (HCC) ?  GERD without esophagitis ?  Chronic constipation ?  Dyslipidemia associated with type 2 diabetes mellitus (HCC) ?  Anxiety with depression ?  Congestive heart failure due to valvular disease (HCC) ?  S/P placement of cardiac pacemaker ?  HTN (hypertension) ?  HLD (hyperlipidemia) ?  ?Assessment and Plan: ? ?* Acute occlusion of artery of upper extremity due to thrombus (HCC) ?- Vascular surgeon, Dr. Myra Gianotti, has been consulted and they will continue to see the patient ?- Continue heparin GTT per pharmacy and vascular surgeon will continue to see patient ?- Goal at the time of admission, per surgeon, is to avoid  or given patient's age, debility, multiple comorbidities ?- Admit to progressive, observation ? ?HTN (hypertension) ?- At home isosorbide mononitrate 30 mg daily, torsemide 20 mg daily ? ?Anxiety  with depression ?- Resumed home mirtazapine 15 mg nightly ? ?Chronic constipation ?- MiraLAX as needed daily for mild to moderate constipation resumed ? ?GERD without esophagitis ?- Resumed home famotidine 20 mg nightly and PPI ? ?Sick sinus syndrome (HCC) ?- Status post cardiac device implementation ?- Home digoxin 0.125 mg every other day ? ?Atrial fibrillation (HCC) ?- Resumed home Imdur 30 mg daily ? ?Chart reviewed.  ? ?Hospitalization from 07/01/2021-07/03/2021: She presented to ED at that time for chief concerns of shortness of breath.  She was admitted under the diagnosis of acute on chronic diastolic heart failure.  She was diuresed with IV furosemide.  She was discharged on home torsemide 20 mg daily and metolazone as needed. ?At the time of the admission she also had hospital delirium.  She also had urinary retention on admission which resolved. ? ?DVT prophylaxis: Heparin GGT ?Code Status: Full code, confirmed with son Alenah Laplume ?Diet: N.p.o. ?Family Communication: Updated son Beckley Przybylski over the phone. Attempted to call niece, Marchelle Folks, no pick up. Left a voicemail that someone will update her tomorrow ?Disposition Plan: Pending clinical course ?Consults called: Vascular ?Admission status: Progressive, observation ? ?Past Medical History:  ?Diagnosis Date  ? A-fib (HCC)   ? Anemia   ? Arthritis   ? Breast cancer (HCC) 1978  ? left breast with lymph node removal  ? CHF (congestive heart failure) (HCC)   ? Diabetes mellitus without complication (HCC)   ? Diverticulitis   ? Dysrhythmia   ? GERD (gastroesophageal reflux disease)   ? Hyperlipidemia   ? Macular degeneration of both eyes   ? Mitral valve prolapse   ? Mitral valve regurgitation   ? Presence of permanent cardiac pacemaker   ? ?Past Surgical History:  ?Procedure Laterality Date  ? ABDOMINAL HYSTERECTOMY    ? APPENDECTOMY    ? BREAST SURGERY    ? CARDIAC CATHETERIZATION    ? ESOPHAGEAL DILATION    ? EYE SURGERY Bilateral   ? Cataract Extraction  with IOL  ? INSERT / REPLACE / REMOVE PACEMAKER    ? IRRIGATION AND DEBRIDEMENT HEMATOMA Left 08/21/2015  ? Procedure: IRRIGATION AND DEBRIDEMENT HEMATOMA;  Surgeon: Earline Mayotte, MD;  Location: [facility] ORS;  Service: General;  Laterality: Left;  ? KNEE ARTHROSCOPY Right   ? LEFT OOPHORECTOMY Left   ? MASTECTOMY Left 1978  ? MASTECTOMY Right 1978  ? OPEN REDUCTION INTERNAL FIXATION (ORIF) DISTAL RADIAL FRACTURE Right 02/14/2018  ? Procedure: OPEN REDUCTION INTERNAL FIXATION (ORIF) DISTAL RADIAL FRACTURE;  Surgeon: Kennedy Bucker, MD;  Location: [facility] ORS;  Service: Orthopedics;  Laterality: Right;  ? PACEMAKER INSERTION  08/11/12  ? PPM GENERATOR CHANGEOUT N/A 05/02/2020  ? Procedure: PPM GENERATOR CHANGEOUT;  Surgeon: Marcina Millard, MD;  Location: [facility] INVASIVE CV LAB;  Service: Cardiovascular;  Laterality: N/A;  ? TEMPORAL ARTERY BIOPSY / LIGATION    ? TONSILLECTOMY    ? ?Social History:  reports that she has never smoked. She has never used smokeless tobacco. She reports that she does not drink alcohol and does not use drugs. ? ?Allergies  ?Allergen Reactions  ? Codeine Nausea Only  ? Disopyramide   ?  Other reaction(s): Unknown  ? Ibuprofen Diarrhea  ? Iodine   ?  blisters  ? Metformin And Related Other (See Comments)  ?  unknown  ? Norpace [Disopyramide Phosphate]   ? Quinidine   ?  Other reaction(s): Unknown  ? Terfenadine   ?  Other reaction(s): Unknown  ? Topiramate   ?  Other reaction(s): Other (See Comments) ?Hair loss  ? Verapamil   ?  Other reaction(s): Unknown  ? Dexamethasone Sodium Phosphate Palpitations  ? ?Family History  ?Problem Relation Age of Onset  ? Hypertension Mother   ? ?Family history: Family history reviewed and not pertinent ? ?Prior to Admission medications   ?Medication Sig Start Date End Date Taking? Authorizing Provider  ?acetaminophen (TYLENOL) 325 MG tablet Take 2 tablets (650 mg total) by mouth every 6 (six) hours as needed for mild pain. 04/29/21  Yes Leeroy Bock, MD   ?cholecalciferol (VITAMIN D3) 25 MCG (1000 UNIT) tablet Take 2,000 Units by mouth daily.   Yes [provider]  ?digoxin (LANOXIN) 0.125 MG tablet Take 1 tablet (0.125 mg total) by mouth every o

## 2021-07-25 NOTE — ED Provider Notes (Signed)
Given CT angio of the right arm requires IV in the left arm I did discuss with patient risk of extravasation of contrast in arm that had previous mastectomy on the same side. Did discuss decreased drainage of that arm if extravasation would occur and possibility of side effects of contrast extravasation.  ?  ?Nance Pear, MD ?07/25/21 2028 ? ?

## 2021-07-25 NOTE — Assessment & Plan Note (Addendum)
-   Vascular surgeon, Dr. Trula Slade, has been consulted and they will continue to see the patient ?- Continue heparin GTT per pharmacy and vascular surgeon will continue to see patient ?- Goal at the time of admission, per surgeon, is to avoid or given patient's age, debility, multiple comorbidities ?- Admit to progressive, observation ?

## 2021-07-25 NOTE — ED Notes (Signed)
Pt endorsing that their R arm feels numb at this time. Pt able to move arm with no issue ?

## 2021-07-25 NOTE — ED Notes (Signed)
Attempted to look for IV access. With patient limited access, I was unable to stick patient. ?

## 2021-07-25 NOTE — Assessment & Plan Note (Signed)
-   At home isosorbide mononitrate 30 mg daily, torsemide 20 mg daily ?

## 2021-07-25 NOTE — ED Notes (Signed)
R arm appears slightly weaker than left during neuro assessment ?

## 2021-07-25 NOTE — ED Triage Notes (Signed)
Pt arrives today with concerns for R arm pain starting approx one hour ago. Pt has PMH of stroke and pacemaker. Pt arrived alert and oriented and assisted to stretcher. ?

## 2021-07-25 NOTE — Hospital Course (Signed)
[name] is a 88 year old female with sick sinus syndrome, hypertension, hyperlipidemia, insulin-dependent diabetes mellitus, depression, dementia, GERD, chronic constipation, who presents emergency department for chief concerns of sudden onset of right upper extremity pain. ? ?Initial vitals in the emergency department showed temperature of 97.5, respiration rate of 18, heart rate 64, blood pressure 140/67, SPO2 99% on room air. ? ?Serum sodium is 141, potassium 4.1, chloride of 98, bicarb 32, BUN of 30, serum creatinine 1.03, GFR greater than 50, nonfasting blood glucose 246, WBC 4.9, hemoglobin 12.3, platelets of 245. ? ?CTA of the right upper extremity with and without contrast: Was read as abrupt cut off of the distal right radial artery with some reconstitution of the forearm vessels as above.  Concerning for possible embolus.  Area of decreased perfusion of the lower pole of the right kidney.  Cannot exclude embolic involvement and infarct.  Cholelithiasis. ? ?CT of the head without contrast read as no evidence of intracranial abnormality.  Small vessel ischemic changes. ? ?ED treatment: Heparin GTT was initiated.  ED provider consulted vascular who states that they would take patient to the OR for thrombectomy on night of admission. ?

## 2021-07-25 NOTE — Assessment & Plan Note (Signed)
-   Status post cardiac device implementation ?- Home digoxin 0.125 mg every other day ?

## 2021-07-25 NOTE — Assessment & Plan Note (Signed)
-   Resumed home Imdur 30 mg daily ?

## 2021-07-25 NOTE — Assessment & Plan Note (Signed)
-   Resumed home famotidine 20 mg nightly and PPI ?

## 2021-07-25 NOTE — ED Provider Notes (Signed)
? ?[facility] ?Provider Note ? ? ? Event Date/Time  ? First MD Initiated Contact with Patient 07/25/21 1916   ?  (approximate) ? ? ?History  ? ?Arm Pain ? ? ?HPI ?Elizabeth Novak is a 88 y.o. female  who, per discharge summary dated 07/03/21 has history of heart failure, who presents to the emergency department today because of concern for right arm pain. The patient states that the pain started about an hour prior to arrival. She had just finished dinner when she had acute onset of pain to her right arm. She denies any unusual activity or injury to that arm. The severe pain has lessened at the time of my exam but the patient states she now feels some numbness to that arm. She denies similar symptoms in the past.  ? ? ?Physical Exam  ? ?Triage Vital Signs: ?ED Triage Vitals  ?Enc Vitals Group  ?   BP 07/25/21 1857 137/72  ?   Pulse Rate 07/25/21 1853 67  ?   Resp 07/25/21 1853 18  ?   Temp 07/25/21 1853 (!) 97.5 ?F (36.4 ?C)  ?   Temp Source 07/25/21 1853 Oral  ?   SpO2 07/25/21 1851 98 %  ?   Weight 07/25/21 1855 110 lb 3.7 oz (50 kg)  ?   Height 07/25/21 1855 '5\' 4"'$  (1.626 m)  ?   Head Circumference --   ?   Peak Flow --   ?   Pain Score 07/25/21 1854 0  ?   Pain Loc --   ?   Pain Edu? --   ?   Excl. in [place]? --   ? ? ?Most recent vital signs: ?Vitals:  ? 07/25/21 1857 07/25/21 1900  ?BP: 137/72 140/67  ?Pulse:  64  ?Resp:    ?Temp:    ?SpO2:  99%  ? ?General: Awake, alert and oriented. ?CV:  Regular rate and rhythm. Non palpable radial pulse right arm, left arm radial pulse 2+ ?Resp:  Normal effort. Lungs clear. ?Abd:  No distention. Non tender ?Right UE: No discoloration. Warm. Grip strength 5/5 ? ? ?ED Results / Procedures / Treatments  ? ?Labs ?(all labs ordered are listed, but only abnormal results are displayed) ?Labs Reviewed  ?BASIC METABOLIC PANEL - Abnormal; Notable for the following components:  ?    Result Value  ? Glucose, Bld 246 (*)   ? BUN 30 (*)   ? Creatinine, Ser 1.03 (*)    ? GFR, Estimated 50 (*)   ? All other components within normal limits  ?CBC - Abnormal; Notable for the following components:  ? Hemoglobin 10.6 (*)   ? HCT 33.8 (*)   ? All other components within normal limits  ?BASIC METABOLIC PANEL - Abnormal; Notable for the following components:  ? Potassium 3.2 (*)   ? CO2 33 (*)   ? BUN 25 (*)   ? All other components within normal limits  ?CBG MONITORING, ED - Abnormal; Notable for the following components:  ? Glucose-Capillary 239 (*)   ? All other components within normal limits  ?CBG MONITORING, ED - Abnormal; Notable for the following components:  ? Glucose-Capillary 50 (*)   ? All other components within normal limits  ?CBG MONITORING, ED - Abnormal; Notable for the following components:  ? Glucose-Capillary 184 (*)   ? All other components within normal limits  ?CBC WITH DIFFERENTIAL/PLATELET  ?HEPARIN LEVEL (UNFRACTIONATED)  ?HEPARIN LEVEL (UNFRACTIONATED)  ? ? ? ?EKG ? ?  Apolonio Schneiders, attending physician, personally viewed and interpreted this EKG ? ?EKG Time: 1906 ?Rate: 68 ?Rhythm: paced rhythm ?Axis: left axis deviation ?Intervals: qtc 441 ?QRS: wide ?ST changes: no st elevation ?Impression: abnormal ekg ? ?RADIOLOGY ?I independently interpreted and visualized the ct angio right upper extremity. My interpretation: arterial occlusion ?Radiology interpretation:  ?IMPRESSION:  ?Abrupt cut off of the distal right radial artery with some  ?reconstitution of the forearm vessels as above. This is concerning  ?for possible embolus. Recommend vascular surgical consultation.  ?   ?Area of decreased perfusion in the lower pole of the right kidney.  ?Cannot exclude embolic involvement and infarct.  ?   ?Cholelithiasis.  ? ?I, Nance Pear, personally discussed these images (CT angio) and results by phone with the on-call radiologist and used this discussion as part of my medical decision making.  ? ?CT head ?IMPRESSION:  ?No evidence of acute intracranial  abnormality. Small vessel ischemic  ?changes.  ? ? ?PROCEDURES: ? ?Critical Care performed: Yes, see critical care procedure note(s) ? ?Procedures ? ?CRITICAL CARE ?Performed by: Nance Pear ? ? ?Total critical care time: 40 minutes ? ?Critical care time was exclusive of separately billable procedures and treating other patients. ? ?Critical care was necessary to treat or prevent imminent or life-threatening deterioration. ? ?Critical care was time spent personally by me on the following activities: development of treatment plan with patient and/or surrogate as well as nursing, discussions with consultants, evaluation of patient's response to treatment, examination of patient, obtaining history from patient or surrogate, ordering and performing treatments and interventions, ordering and review of laboratory studies, ordering and review of radiographic studies, pulse oximetry and re-evaluation of patient's condition. ? ? ?MEDICATIONS ORDERED IN ED: ?Medications - No data to display ? ? ?IMPRESSION / MDM / ASSESSMENT AND PLAN / ED COURSE  ?I reviewed the triage vital signs and the nursing notes. ?             ?               ? ?Differential diagnosis includes, but is not limited to, muscle cramping, electrolyte abnormality, arterial occlusion. ? ?Patient presented to the emergency department today because of concerns for right arm pain.  On exam she did not have a palpable radial pulse although no other signs of arterial occlusion.  She did states she only felt some numbness at the time my exam.  I did obtain a CT angio of that right upper extremity which was concerning for arterial occlusion.  I discussed this finding with the patient. I discussed with Dr. Trula Slade with vascular surgery who will evaluate the patient. Did write for patient to start heparin. Discussed with Dr. Tobie Poet with the hospitalist service who will plan on admission. ? ? ?FINAL CLINICAL IMPRESSION(S) / ED DIAGNOSES  ? ?Final diagnoses:  ?Right  arm pain  ?Brachial artery occlusion (HCC)  ? ? ? ?Note:  This document was prepared using Dragon voice recognition software and may include unintentional dictation errors. ? ?  ?Nance Pear, MD ?07/26/21 1535 ? ?

## 2021-07-25 NOTE — ED Notes (Signed)
Pt to CT with tech.

## 2021-07-25 NOTE — ED Notes (Signed)
Pt speech appears to be slurred. MD aware. ?

## 2021-07-25 NOTE — Assessment & Plan Note (Signed)
-   MiraLAX as needed daily for mild to moderate constipation resumed ?

## 2021-07-26 ENCOUNTER — Encounter: Payer: Self-pay | Admitting: Internal Medicine

## 2021-07-26 ENCOUNTER — Other Ambulatory Visit: Payer: Self-pay

## 2021-07-26 ENCOUNTER — Observation Stay
Admit: 2021-07-26 | Discharge: 2021-07-26 | Disposition: A | Discharge status: Home / Self Care | Payer: Medicare Other | Attending: Internal Medicine | Admitting: Internal Medicine

## 2021-07-26 DIAGNOSIS — E876 Hypokalemia: Secondary | ICD-10-CM | POA: Diagnosis present

## 2021-07-26 DIAGNOSIS — E11649 Type 2 diabetes mellitus with hypoglycemia without coma: Secondary | ICD-10-CM | POA: Diagnosis present

## 2021-07-26 DIAGNOSIS — F0394 Unspecified dementia, unspecified severity, with anxiety: Secondary | ICD-10-CM | POA: Diagnosis present

## 2021-07-26 DIAGNOSIS — E1169 Type 2 diabetes mellitus with other specified complication: Secondary | ICD-10-CM | POA: Diagnosis present

## 2021-07-26 DIAGNOSIS — Z8249 Family history of ischemic heart disease and other diseases of the circulatory system: Secondary | ICD-10-CM | POA: Diagnosis not present

## 2021-07-26 DIAGNOSIS — E1151 Type 2 diabetes mellitus with diabetic peripheral angiopathy without gangrene: Secondary | ICD-10-CM | POA: Diagnosis present

## 2021-07-26 DIAGNOSIS — I110 Hypertensive heart disease with heart failure: Secondary | ICD-10-CM | POA: Diagnosis present

## 2021-07-26 DIAGNOSIS — I749 Embolism and thrombosis of unspecified artery: Secondary | ICD-10-CM | POA: Diagnosis present

## 2021-07-26 DIAGNOSIS — I425 Other restrictive cardiomyopathy: Secondary | ICD-10-CM | POA: Diagnosis present

## 2021-07-26 DIAGNOSIS — Z79899 Other long term (current) drug therapy: Secondary | ICD-10-CM | POA: Diagnosis not present

## 2021-07-26 DIAGNOSIS — I509 Heart failure, unspecified: Secondary | ICD-10-CM | POA: Diagnosis not present

## 2021-07-26 DIAGNOSIS — K5909 Other constipation: Secondary | ICD-10-CM | POA: Diagnosis not present

## 2021-07-26 DIAGNOSIS — I38 Endocarditis, valve unspecified: Secondary | ICD-10-CM | POA: Diagnosis not present

## 2021-07-26 DIAGNOSIS — Z950 Presence of cardiac pacemaker: Secondary | ICD-10-CM | POA: Diagnosis not present

## 2021-07-26 DIAGNOSIS — I480 Paroxysmal atrial fibrillation: Secondary | ICD-10-CM | POA: Diagnosis present

## 2021-07-26 DIAGNOSIS — Z66 Do not resuscitate: Secondary | ICD-10-CM | POA: Diagnosis present

## 2021-07-26 DIAGNOSIS — I495 Sick sinus syndrome: Secondary | ICD-10-CM | POA: Diagnosis present

## 2021-07-26 DIAGNOSIS — Z794 Long term (current) use of insulin: Secondary | ICD-10-CM | POA: Diagnosis not present

## 2021-07-26 DIAGNOSIS — Z853 Personal history of malignant neoplasm of breast: Secondary | ICD-10-CM | POA: Diagnosis not present

## 2021-07-26 DIAGNOSIS — Z7901 Long term (current) use of anticoagulants: Secondary | ICD-10-CM | POA: Diagnosis not present

## 2021-07-26 DIAGNOSIS — E1165 Type 2 diabetes mellitus with hyperglycemia: Secondary | ICD-10-CM | POA: Diagnosis not present

## 2021-07-26 DIAGNOSIS — E7800 Pure hypercholesterolemia, unspecified: Secondary | ICD-10-CM | POA: Diagnosis present

## 2021-07-26 DIAGNOSIS — I5033 Acute on chronic diastolic (congestive) heart failure: Secondary | ICD-10-CM | POA: Diagnosis present

## 2021-07-26 DIAGNOSIS — N280 Ischemia and infarction of kidney: Secondary | ICD-10-CM | POA: Diagnosis present

## 2021-07-26 DIAGNOSIS — F0393 Unspecified dementia, unspecified severity, with mood disturbance: Secondary | ICD-10-CM | POA: Diagnosis present

## 2021-07-26 DIAGNOSIS — K5641 Fecal impaction: Secondary | ICD-10-CM | POA: Diagnosis present

## 2021-07-26 DIAGNOSIS — I742 Embolism and thrombosis of arteries of the upper extremities: Secondary | ICD-10-CM | POA: Diagnosis present

## 2021-07-26 DIAGNOSIS — K219 Gastro-esophageal reflux disease without esophagitis: Secondary | ICD-10-CM | POA: Diagnosis present

## 2021-07-26 LAB — CBC
HCT: 33.8 % — ABNORMAL LOW (ref 36.0–46.0)
Hemoglobin: 10.6 g/dL — ABNORMAL LOW (ref 12.0–15.0)
MCH: 26.7 pg (ref 26.0–34.0)
MCHC: 31.4 g/dL (ref 30.0–36.0)
MCV: 85.1 fL (ref 80.0–100.0)
Platelets: 232 10*3/uL (ref 150–400)
RBC: 3.97 MIL/uL (ref 3.87–5.11)
RDW: 14.2 % (ref 11.5–15.5)
WBC: 5 10*3/uL (ref 4.0–10.5)
nRBC: 0 % (ref 0.0–0.2)

## 2021-07-26 LAB — ECHOCARDIOGRAM COMPLETE
AR max vel: 1.63 cm2
AV Peak grad: 5.8 mmHg
Ao pk vel: 1.2 m/s
Area-P 1/2: 3.1 cm2
Height: 64 in
MV M vel: 5.43 m/s
MV Peak grad: 117.9 mmHg
MV VTI: 1.92 cm2
S' Lateral: 2.78 cm
Weight: 1763.68 oz

## 2021-07-26 LAB — BASIC METABOLIC PANEL
Anion gap: 6 (ref 5–15)
BUN: 25 mg/dL — ABNORMAL HIGH (ref 8–23)
CO2: 33 mmol/L — ABNORMAL HIGH (ref 22–32)
Calcium: 8.9 mg/dL (ref 8.9–10.3)
Chloride: 103 mmol/L (ref 98–111)
Creatinine, Ser: 0.82 mg/dL (ref 0.44–1.00)
GFR, Estimated: >60 mL/min (ref >60)
Glucose, Bld: 86 mg/dL (ref 70–99)
Potassium: 3.2 mmol/L — ABNORMAL LOW (ref 3.5–5.1)
Sodium: 142 mmol/L (ref 135–145)

## 2021-07-26 LAB — CBG MONITORING, ED
Glucose-Capillary: 50 mg/dL — ABNORMAL LOW (ref 70–99)
Glucose-Capillary: 184 mg/dL — ABNORMAL HIGH (ref 70–99)

## 2021-07-26 LAB — GLUCOSE, CAPILLARY
Glucose-Capillary: 198 mg/dL — ABNORMAL HIGH (ref 70–99)
Glucose-Capillary: 188 mg/dL — ABNORMAL HIGH (ref 70–99)

## 2021-07-26 LAB — HEPARIN LEVEL (UNFRACTIONATED)
Heparin Unfractionated: 0.48 IU/mL (ref 0.30–0.70)
Heparin Unfractionated: 0.48 IU/mL (ref 0.30–0.70)

## 2021-07-26 MED ORDER — ALUM & MAG HYDROXIDE-SIMETH 200-200-20 MG/5ML PO SUSP
30.0000 mL | ORAL | Status: DC | PRN
  Administered 2021-07-26 – 2021-08-01 (×2): 30 mL via ORAL
  Filled 2021-07-26 (×2): qty 30

## 2021-07-26 MED ORDER — INSULIN ASPART 100 UNIT/ML IJ SOLN
0.0000 [IU] | Freq: Three times a day (TID) | INTRAMUSCULAR | Status: DC
  Administered 2021-07-26 – 2021-07-27 (×2): 1 [IU] via SUBCUTANEOUS
  Administered 2021-07-27 – 2021-07-28 (×2): 2 [IU] via SUBCUTANEOUS
  Administered 2021-07-28 – 2021-07-29 (×4): 1 [IU] via SUBCUTANEOUS
  Administered 2021-07-30: 2 [IU] via SUBCUTANEOUS
  Administered 2021-07-30: 1 [IU] via SUBCUTANEOUS
  Administered 2021-07-30: 3 [IU] via SUBCUTANEOUS
  Administered 2021-07-31 – 2021-08-01 (×4): 1 [IU] via SUBCUTANEOUS
  Administered 2021-08-02: 3 [IU] via SUBCUTANEOUS
  Filled 2021-07-26 (×10): qty 1

## 2021-07-26 MED ORDER — DEXTROSE 50 % IV SOLN
INTRAVENOUS | Status: AC
  Filled 2021-07-26: qty 50

## 2021-07-26 MED ORDER — POTASSIUM CHLORIDE CRYS ER 20 MEQ PO TBCR
20.0000 meq | EXTENDED_RELEASE_TABLET | Freq: Two times a day (BID) | ORAL | Status: AC
  Administered 2021-07-26 – 2021-07-27 (×4): 20 meq via ORAL
  Filled 2021-07-26 (×4): qty 1

## 2021-07-26 NOTE — Progress Notes (Signed)
?PROGRESS NOTE ? ? ? ?Elizabeth Novak  WJX:[id number] DOB: 1924-02-15 DOA: 07/25/2021 ?PCP: [facility]  ? ? ?Brief Narrative:  ?88 year old female with history of sick sinus syndrome status post pacemaker, hypertension, hyperlipidemia, insulin-dependent type 2 diabetes, depression, dementia, GERD brought from assisted living facility with sudden onset of right upper extremity pain.  In the emergency room she is hemodynamically stable on room air.  Emergent CT angiogram of the right upper extremity showed abrupt cut off of the distal radial artery with some reconstitution of the forearm vessels as above.  There was concern about possible embolus.  Decreased perfusion of the lower pole of the right kidney. ?Patient does have a history of paroxysmal A-fib and she was taken off anticoagulation due to fall risk and frailty.  Seen by vascular surgery in the emergency room, started on heparin infusion overnight. ? ? ?Assessment & Plan: ?  ?Acute occlusion of the artery of upper extremity due to thrombus: ?Seen by vascular surgery, started on heparin.  Remains a stable.  Not a candidate for thrombectomy due to advanced age and frailty. ?We will check 2D echocardiogram to see if she has any intracardiac thrombus.  Recent 2D echocardiogram is normal. ?Patient has recently moved to assisted living facility, likely can go on Eliquis given significant thrombus. ?Continue monitoring on heparin for next 24 to 48 hours before starting on oral anticoagulation. ?Seen and followed by vascular surgery. ? ?Essential hypertension: Blood pressure stable on Imdur and torsemide.  Continue. ? ?Anxiety and depression: On Remeron 15 mg at night. ? ?Sick sinus syndrome, paroxysmal A-fib: Remains paced rhythm.  Currently on digoxin.  Rate controlled.  Anticoagulation as above. ? ?Insulin-dependent type 2 diabetes with hypoglycemia: Patient had hypoglycemic episode with blood sugar 50.  Symptomatic.  Reversed.   With poor appetite, hold off on long-acting insulin until stabilization.  Allow regular diet. ? ? ?DVT prophylaxis: Place TED hose Start: 07/25/21 2215 ? ? ?Code Status: DNR ?Family Communication: None ?Disposition Plan: Status is: Observation ?The patient will require care spanning > 2 midnights and should be moved to inpatient because: Heparin infusion, monitoring for intra-arterial thrombus ?  ? ? ?Consultants:  ?Vascular surgery ? ?Procedures:  ?None ? ?Antimicrobials:  ?None ? ? ?Subjective: ?Patient seen and examined.  I was called to the bedside because patient is feeling dizzy, funny and very sick.  Stat blood sugar check resulted blood sugar of 50, corrected with IV dextrose with improved symptoms. ?Patient tells me that her son is on his way from [facility]. ?Patient has poor appetite.  Right hand pain has improved somehow. ? ?Objective: ?Vitals:  ? 07/26/21 0304 07/26/21 0700 07/26/21 0900 07/26/21 1100  ?BP:  (!) 110/44 (!) 109/55 100/70  ?Pulse: 65 82 81 71  ?Resp: 20 17 (!) 24 17  ?Temp: 98 ?F (36.7 ?C) 97.9 ?F (36.6 ?C)  98.5 ?F (36.9 ?C)  ?TempSrc: Oral Oral  Oral  ?SpO2: 99% 97% 99% 98%  ?Weight:      ?Height:      ? ?No intake or output data in the 24 hours ending 07/26/21 1240 ?Filed Weights  ? 07/25/21 1855  ?Weight: 50 kg  ? ? ?Examination: ? ?General exam: On room air.  Frail and debilitated.  Chronically sick looking but not in any distress currently. ?Mildly anxious. ?Respiratory system: Clear to auscultation. Respiratory effort normal.  No added sounds. ?Cardiovascular system: S1 & S2 heard, irregularly irregular.  Pacemaker in place. ?Gastrointestinal system: Soft.  Nontender.  Bowel sound present. ?Central nervous system: Alert and oriented.  Generalized weakness. ?Extremities:  ?Right radial pulse absent.  Temperature colder than left. No ischemic changes.  Adequate radial pulse on the left side. ?Bilateral pedal edema, left more than right. ? ? ? ? ?Data Reviewed: I have personally  reviewed following labs and imaging studies ? ?CBC: ?Recent Labs  ?Lab 07/25/21 ?1934 07/26/21 ?0533  ?WBC 4.9 5.0  ?NEUTROABS 3.3  --   ?HGB 12.3 10.6*  ?HCT 39.9 33.8*  ?MCV 89.1 85.1  ?PLT 245 232  ? ?Basic Metabolic Panel: ?Recent Labs  ?Lab 07/25/21 ?1934 07/26/21 ?0533  ?NA 141 142  ?K 4.1 3.2*  ?CL 98 103  ?CO2 32 33*  ?GLUCOSE 246* 86  ?BUN 30* 25*  ?CREATININE 1.03* 0.82  ?CALCIUM 9.3 8.9  ? ?GFR: ?Estimated Creatinine Clearance: 32.4 mL/min (by C-G formula based on SCr of 0.82 mg/dL). ?Liver Function Tests: ?No results for input(s): AST, ALT, ALKPHOS, BILITOT, PROT, ALBUMIN in the last 168 hours. ?No results for input(s): LIPASE, AMYLASE in the last 168 hours. ?No results for input(s): AMMONIA in the last 168 hours. ?Coagulation Profile: ?No results for input(s): INR, PROTIME in the last 168 hours. ?Cardiac Enzymes: ?No results for input(s): CKTOTAL, CKMB, CKMBINDEX, TROPONINI in the last 168 hours. ?BNP (last 3 results) ?No results for input(s): PROBNP in the last 8760 hours. ?HbA1C: ?No results for input(s): HGBA1C in the last 72 hours. ?CBG: ?Recent Labs  ?Lab 07/25/21 ?2356 07/26/21 ?0738 07/26/21 ?1144  ?GLUCAP 239* 50* 184*  ? ?Lipid Profile: ?No results for input(s): CHOL, HDL, LDLCALC, TRIG, CHOLHDL, LDLDIRECT in the last 72 hours. ?Thyroid Function Tests: ?No results for input(s): TSH, T4TOTAL, FREET4, T3FREE, THYROIDAB in the last 72 hours. ?Anemia Panel: ?No results for input(s): VITAMINB12, FOLATE, FERRITIN, TIBC, IRON, RETICCTPCT in the last 72 hours. ?Sepsis Labs: ?No results for input(s): PROCALCITON, LATICACIDVEN in the last 168 hours. ? ?No results found for this or any previous visit (from the past 240 hour(s)).  ? ? ? ? ? ?Radiology Studies: ?CT Head Wo Contrast ? ?Result Date: 07/25/2021 ?CLINICAL DATA:  Right arm numbness EXAM: CT HEAD WITHOUT CONTRAST TECHNIQUE: Contiguous axial images were obtained from the base of the skull through the vertex without intravenous contrast. RADIATION  DOSE REDUCTION: This exam was performed according to the departmental dose-optimization program which includes automated exposure control, adjustment of the mA and/or kV according to patient size and/or use of iterative reconstruction technique. COMPARISON:  04/25/2021 FINDINGS: Brain: No evidence of acute infarction, hemorrhage, hydrocephalus, extra-axial collection or mass lesion/mass effect. Subcortical white matter and periventricular small vessel ischemic changes, particularly in the subcortical frontal lobes and right posterior parietal lobe. Vascular: Intracranial atherosclerosis. Skull: Normal. Negative for fracture or focal lesion. Sinuses/Orbits: The visualized paranasal sinuses are essentially clear. The mastoid air cells are unopacified. Other: None. IMPRESSION: No evidence of acute intracranial abnormality. Small vessel ischemic changes. Electronically Signed   By: Charline Bills M.D.   On: 07/25/2021 20:56  ? ?CT ANGIO UP EXTREM RIGHT W &/OR WO CONTRAST ? ?Result Date: 07/25/2021 ?CLINICAL DATA:  Right arm pain EXAM: CT ANGIOGRAPHY OF THE RIGHT UPPER EXTREMITY TECHNIQUE: Multidetector CT imaging of the right upper extremity was performed using the standard protocol during bolus administration of intravenous contrast. Multiplanar CT image reconstructions and MIPs were obtained to evaluate the vascular anatomy. RADIATION DOSE REDUCTION: This exam was performed according to the departmental dose-optimization program which includes automated exposure control, adjustment of the  mA and/or kV according to patient size and/or use of iterative reconstruction technique. CONTRAST:  OMNIPAQUE IOHEXOL 350 MG/ML SOLN COMPARISON:  None. FINDINGS: Right subclavian artery and axillary artery are widely patent and unremarkable. There is abrupt cut off of the distal right brachial artery just above the elbow. Some contrast is seen in the ulnar in interosseous artery in the forearm compatible with some  reconstitution of distal flow. No flow seen in the radial artery. 3 mm nodule in the right upper lobe on image 28. Adjacent 2 mm nodule on image 31. Visualized right lung otherwise clear. No pleural effusion. No a

## 2021-07-26 NOTE — ED Notes (Signed)
RN informed bed assigned 

## 2021-07-26 NOTE — Consult Note (Signed)
ANTICOAGULATION CONSULT NOTE  ? ?Pharmacy Consult for heparin ?Indication: DVT ? ?Allergies  ?Allergen Reactions  ? Codeine Nausea Only  ? Disopyramide   ?  Other reaction(s): Unknown  ? Ibuprofen Diarrhea  ? Iodine   ?  blisters  ? Metformin And Related Other (See Comments)  ?  unknown  ? Norpace [Disopyramide Phosphate]   ? Quinidine   ?  Other reaction(s): Unknown  ? Terfenadine   ?  Other reaction(s): Unknown  ? Topiramate   ?  Other reaction(s): Other (See Comments) ?Hair loss  ? Verapamil   ?  Other reaction(s): Unknown  ? Dexamethasone Sodium Phosphate Palpitations  ? ? ?Patient Measurements: ?Height: '5\' 4"'$  (162.6 cm) ?Weight: 50 kg (110 lb 3.7 oz) ?IBW/kg (Calculated) : 54.7 ?Heparin Dosing Weight: 50 kg ? ?Vital Signs: ?Temp: 98 ?F (36.7 ?C) (04/22 0304) ?Temp Source: Oral (04/22 0304) ?BP: 133/54 (04/21 2300) ?Pulse Rate: 65 (04/22 0304) ? ?Labs: ?Recent Labs  ?  07/25/21 ?1934 07/26/21 ?0533  ?HGB 12.3 10.6*  ?HCT 39.9 33.8*  ?PLT 245 232  ?HEPARINUNFRC  --  0.48  ?CREATININE 1.03* 0.82  ? ? ? ?Estimated Creatinine Clearance: 32.4 mL/min (by C-G formula based on SCr of 0.82 mg/dL). ? ? ?Medical History: ?Past Medical History:  ?Diagnosis Date  ? A-fib ([place])   ? Anemia   ? Arthritis   ? Breast cancer ([place]) 1978  ? left breast with lymph node removal  ? CHF (congestive heart failure) ([place])   ? Diabetes mellitus without complication ([place])   ? Diverticulitis   ? Dysrhythmia   ? GERD (gastroesophageal reflux disease)   ? Hyperlipidemia   ? Macular degeneration of both eyes   ? Mitral valve prolapse   ? Mitral valve regurgitation   ? Presence of permanent cardiac pacemaker   ? ? ?Medications:  ?PTA:  ?Inpatient: Heparin drip  ?Allergies: No AC/APT related allergies ? ?Assessment: ?88 y.o. Novak with past medical history of hypertension, diabetes, CHF, atrial fibrillation, sick sinus syndrome status post pacemaker, and stroke who presents to the ED complaining of abdominal pain. Pharmacy consulted for heparin  drip in the setting of DVT. ? ?4/21: CT Angio of Right upper extremity: Concerning for possible embolus."  ? ?Date Time aPTT/HL Rate/Comment ?4/21 2200   3500u x1, 850u/hr ?4/22 0533 -- / 0.48 Therapeutic x 1   ?Baseline Labs: ?aPTT - ordered ?Hgb - 12.3 ?Plts - 245 ? ?Goal of Therapy:  ?Heparin level 0.3-0.7 units/ml ?Monitor platelets by anticoagulation protocol: Yes ?  ?Plan:  ?Continue heparin infusion at 850 units/hr ?Recheck HL in 8 hours to confirm, then daily once consecutively therapeutic. ?Continue to monitor H&H and platelets daily while on heparin gtt. ? ? ?Thank you for allowing pharmacy to be a part of this pleasant patient?s care. ? ?Renda Rolls, PharmD, MBA ?07/26/2021 ?6:34 AM ? ? ? ? ?

## 2021-07-26 NOTE — Progress Notes (Signed)
? ? ?Subjective  -  ? ?No overnight events ?Denies any pain in her right hand, with the exception of some complaints of discomfort at her IV site in the right hand ? ? ?Physical Exam: ? ?Nonpalpable radial or ulnar pulse ?Normal grip strength and sensation in the right hand ? ? ? ? ? ? ?Assessment/Plan:   ? ?Right brachial artery occlusion: It is not completely clear if this represents a acute or subacute event.  According to [facility], her nephew, she has been complaining of right hand discomfort intermittently for several weeks.  Currently, she is without pain in her right arm and has normal motor and sensory function.  I discussed the possibility of a right brachial thrombectomy, however given her age and comorbidities, this would carry some risk.  Given that she is completely asymptomatic at this time, I would recommend just observation.  I did discuss this with the patient and her nephew and they understand that this may change if she develops any symptoms.  She did have a possible renal infarct on CT scan.  She does have a history of atrial fibrillation.  For that reason I think she needs to be anticoagulated.  I would do heparin while she is in the hospital and then consider transition to Eliquis.  The family would also like assistance with placement as she is not a good candidate to return home and has been living in a place where she is not able to get if significant amount of care.  I have recommended social work get involved on Monday. ? ?Annamarie Major ?07/26/2021 ?4:59 PM ?-- ? ?Vitals:  ? 07/26/21 1100 07/26/21 1508  ?BP: 100/70 129/65  ?Pulse: 71 73  ?Resp: 17 18  ?Temp: 98.5 ?F (36.9 ?C) 98.5 ?F (36.9 ?C)  ?SpO2: 98% 99%  ? ? ?Intake/Output Summary (Last 24 hours) at 07/26/2021 1700 ?Last data filed at 07/26/2021 1500 ?Gross per 24 hour  ?Intake 174.51 ml  ?Output --  ?Net 174.51 ml  ? ? ? ?Laboratory ?CBC ?   ?Component Value Date/Time  ? WBC 5.0 07/26/2021 0533  ? HGB 10.6 (L) 07/26/2021 0533  ? HGB  12.6 08/08/2012 1055  ? HCT 33.8 (L) 07/26/2021 0533  ? HCT 37.2 08/08/2012 1055  ? PLT 232 07/26/2021 0533  ? PLT 258 08/08/2012 1055  ? ? ?BMET ?   ?Component Value Date/Time  ? NA 142 07/26/2021 0533  ? NA 138 08/08/2012 1055  ? K 3.2 (L) 07/26/2021 0533  ? K 4.2 08/08/2012 1055  ? CL 103 07/26/2021 0533  ? CL 103 08/08/2012 1055  ? CO2 33 (H) 07/26/2021 0533  ? CO2 17 (L) 08/08/2012 1055  ? GLUCOSE 86 07/26/2021 0533  ? GLUCOSE 228 (H) 08/08/2012 1055  ? BUN 25 (H) 07/26/2021 0533  ? BUN 18 08/08/2012 1055  ? CREATININE 0.82 07/26/2021 0533  ? CREATININE 0.72 08/08/2012 1055  ? CALCIUM 8.9 07/26/2021 0533  ? CALCIUM 10.3 (H) 08/08/2012 1055  ? GFRNONAA >60 07/26/2021 0533  ? GFRNONAA >60 08/08/2012 1055  ? GFRAA >60 10/24/2019 1123  ? GFRAA >60 08/08/2012 1055  ? ? ?COAG ?Lab Results  ?Component Value Date  ? INR 1.0 04/25/2021  ? INR 1.1 03/21/2021  ? INR 1.1 07/02/2019  ? ?No results found for: PTT ? ?Antibiotics ?Anti-infectives (From admission, onward)  ? ? None  ? ?  ? ? ? ?V. Leia Alf, M.D., FACS ?[facility] ?Office: [phone] ?Pager:  [phone]  ?

## 2021-07-26 NOTE — Consult Note (Signed)
ANTICOAGULATION CONSULT NOTE  ? ?Pharmacy Consult for heparin ?Indication: DVT ? ?Allergies  ?Allergen Reactions  ? Codeine Nausea Only  ? Disopyramide   ?  Other reaction(s): Unknown  ? Ibuprofen Diarrhea  ? Iodine   ?  blisters  ? Metformin And Related Other (See Comments)  ?  unknown  ? Norpace [Disopyramide Phosphate]   ? Quinidine   ?  Other reaction(s): Unknown  ? Terfenadine   ?  Other reaction(s): Unknown  ? Topiramate   ?  Other reaction(s): Other (See Comments) ?Hair loss  ? Verapamil   ?  Other reaction(s): Unknown  ? Dexamethasone Sodium Phosphate Palpitations  ? ? ?Patient Measurements: ?Height: '5\' 4"'$  (162.6 cm) ?Weight: 50 kg (110 lb 3.7 oz) ?IBW/kg (Calculated) : 54.7 ?Heparin Dosing Weight: 50 kg ? ?Vital Signs: ?Temp: 98.5 ?F (36.9 ?C) (04/22 1100) ?Temp Source: Oral (04/22 1100) ?BP: 100/70 (04/22 1100) ?Pulse Rate: 71 (04/22 1100) ? ?Labs: ?Recent Labs  ?  07/25/21 ?1934 07/26/21 ?0102 07/26/21 ?1325  ?HGB 12.3 10.6*  --   ?HCT 39.9 33.8*  --   ?PLT 245 232  --   ?HEPARINUNFRC  --  0.48 0.48  ?CREATININE 1.03* 0.82  --   ? ? ? ?Estimated Creatinine Clearance: 32.4 mL/min (by C-G formula based on SCr of 0.82 mg/dL). ? ? ?Medical History: ?Past Medical History:  ?Diagnosis Date  ? A-fib ([place])   ? Anemia   ? Arthritis   ? Breast cancer ([place]) 1978  ? left breast with lymph node removal  ? CHF (congestive heart failure) ([place])   ? Diabetes mellitus without complication ([place])   ? Diverticulitis   ? Dysrhythmia   ? GERD (gastroesophageal reflux disease)   ? Hyperlipidemia   ? Macular degeneration of both eyes   ? Mitral valve prolapse   ? Mitral valve regurgitation   ? Presence of permanent cardiac pacemaker   ? ? ?Medications:  ?PTA:  ?Inpatient: Heparin drip  ?Allergies: No AC/APT related allergies ? ?Assessment: ?88 y.o. female with past medical history of hypertension, diabetes, CHF, atrial fibrillation, sick sinus syndrome status post pacemaker, and stroke who presents to the ED complaining of  abdominal pain. Pharmacy consulted for heparin drip in the setting of DVT. ? ?4/21: CT Angio of Right upper extremity: Concerning for possible embolus."  ? ?Date Time aPTT/HL Rate/Comment ?4/21 2200   3500u x1, 850u/hr ?4/22 0533 -- / 0.48 Therapeutic x 1   ?4/22 1325 -- / 0.48 Therapeutic  ? ?Goal of Therapy:  ?Heparin level 0.3-0.7 units/ml ?Monitor platelets by anticoagulation protocol: Yes ?  ?Plan:  ?Heparin level is therapeutic. Continue heparin infusion at 850 units/hr. Recheck heparin level and CBC with AM labs.  ? ?Thank you for allowing pharmacy to be a part of this pleasant patient?s care. ? ?Eleonore Chiquito, PharmD, BCPS ?07/26/2021 ?1:48 PM ? ? ? ? ?

## 2021-07-26 NOTE — Progress Notes (Signed)
*  PRELIMINARY RESULTS* ?Echocardiogram ?2D Echocardiogram has been performed. ? ?Elizabeth Novak ?07/26/2021, 11:09 AM ?

## 2021-07-26 NOTE — Consult Note (Signed)
? ?[facility] ? ?Patient name: Elizabeth Novak MRN: [id number] DOB: 05-07-1923 Sex: female ? ? ?REQUESTING PROVIDER:  ? ? ER ? ? ?REASON FOR CONSULT:  ?  ?Right arm pain ? ?HISTORY OF PRESENT ILLNESS:  ? ?Elizabeth Novak is a 88 y.o. female, who presented to the emergency department with a several hour history of pain in her right forearm.  She underwent a CT scan which showed a filling defect in her right brachial artery.  There was distal reconstitution.  Since she has been in the emergency department, she is no longer complaining of any pain in her right hand.  She states that she does not have any numbness or loss of grip strength.  She does have a history of atrial fibrillation and several prior strokes.  She has been taken off of her anticoagulation secondary to melena and risk of falls. ? ?The patient has a history of insulin-dependent diabetes she is medically managed for hypertension.  She has a history of hypercholesterolemia.  She was recently in the hospital for urinary tract infection.  She also has a history of acute on chronic heart failure and sick sinus syndrome status post pacemaker. ? ?PAST MEDICAL HISTORY  ? ? ?Past Medical History:  ?Diagnosis Date  ? A-fib ([facility])   ? Anemia   ? Arthritis   ? Breast cancer ([facility]) 1978  ? left breast with lymph node removal  ? CHF (congestive heart failure) ([facility])   ? Diabetes mellitus without complication ([facility])   ? Diverticulitis   ? Dysrhythmia   ? GERD (gastroesophageal reflux disease)   ? Hyperlipidemia   ? Macular degeneration of both eyes   ? Mitral valve prolapse   ? Mitral valve regurgitation   ? Presence of permanent cardiac pacemaker   ? ? ? ?FAMILY HISTORY  ? ?Family History  ?Problem Relation Age of Onset  ? Hypertension Mother   ? ? ?SOCIAL HISTORY:  ? ?Social History  ? ?Socioeconomic History  ? Marital status: Widowed  ?  Spouse name: Not on file  ? Number of children: 1  ? Years of  education: 34  ? Highest education level: 11th grade  ?Occupational History  ? Not on file  ?Tobacco Use  ? Smoking status: Never  ? Smokeless tobacco: Never  ?Vaping Use  ? Vaping Use: Never used  ?Substance and Sexual Activity  ? Alcohol use: No  ?  Alcohol/week: 0.0 standard drinks  ? Drug use: No  ? Sexual activity: Not on file  ?Other Topics Concern  ? Not on file  ?Social History Narrative  ? Widowed  ? 1 son  ? Never smoker or smokeless tobacco user  ? No alcohol use  ? Full Code  ? ?Social Determinants of Health  ? ?Financial Resource Strain: Not on file  ?Food Insecurity: Not on file  ?Transportation Needs: Not on file  ?Physical Activity: Not on file  ?Stress: Not on file  ?Social Connections: Not on file  ?Intimate Partner Violence: Not on file  ? ? ?ALLERGIES:  ? ? ?Allergies  ?Allergen Reactions  ? Codeine Nausea Only  ? Disopyramide   ?  Other reaction(s): Unknown  ? Ibuprofen Diarrhea  ? Iodine   ?  blisters  ? Metformin And Related Other (See Comments)  ?  unknown  ? Norpace [Disopyramide Phosphate]   ? Quinidine   ?  Other reaction(s): Unknown  ? Terfenadine   ?  Other reaction(s): Unknown  ?  Topiramate   ?  Other reaction(s): Other (See Comments) ?Hair loss  ? Verapamil   ?  Other reaction(s): Unknown  ? Dexamethasone Sodium Phosphate Palpitations  ? ? ?CURRENT MEDICATIONS:  ? ? ?Current Facility-Administered Medications  ?Medication Dose Route Frequency Provider Last Rate Last Admin  ? acetaminophen (TYLENOL) tablet 650 mg  650 mg Oral Q6H PRN Cox, Amy N, DO      ? Or  ? acetaminophen (TYLENOL) suppository 650 mg  650 mg Rectal Q6H PRN Cox, Amy N, DO      ? cholecalciferol (VITAMIN D3) tablet 2,000 Units  2,000 Units Oral Daily Cox, Amy N, DO      ? [START ON 07/27/2021] digoxin (LANOXIN) tablet 0.125 mg  0.125 mg Oral Daily Cox, Amy N, DO      ? famotidine (PEPCID) tablet 20 mg  20 mg Oral QHS Cox, Amy N, DO      ? heparin ADULT infusion 100 units/mL (25000 units/280mL)  850 Units/hr Intravenous  Continuous Cox, Amy N, DO 8.5 mL/hr at 07/25/21 2209 850 Units/hr at 07/25/21 2209  ? insulin glargine-yfgn (SEMGLEE) injection 9 Units  9 Units Subcutaneous BID Cox, Amy N, DO   9 Units at 07/25/21 2357  ? isosorbide mononitrate (IMDUR) 24 hr tablet 30 mg  30 mg Oral Daily Cox, Amy N, DO      ? mirtazapine (REMERON) tablet 15 mg  15 mg Oral QHS Cox, Amy N, DO      ? nitroGLYCERIN (NITROSTAT) SL tablet 0.4 mg  0.4 mg Sublingual Q5 min PRN Cox, Amy N, DO      ? ondansetron (ZOFRAN) tablet 4 mg  4 mg Oral Q6H PRN Cox, Amy N, DO      ? Or  ? ondansetron (ZOFRAN) injection 4 mg  4 mg Intravenous Q6H PRN Cox, Amy N, DO      ? pantoprazole (PROTONIX) EC tablet 40 mg  40 mg Oral Daily Cox, Amy N, DO      ? polyethylene glycol (MIRALAX / GLYCOLAX) packet 17 g  17 g Oral Daily PRN Cox, Amy N, DO      ? torsemide (DEMADEX) tablet 20 mg  20 mg Oral Daily Cox, Amy N, DO      ? ?Current Outpatient Medications  ?Medication Sig Dispense Refill  ? acetaminophen (TYLENOL) 325 MG tablet Take 2 tablets (650 mg total) by mouth every 6 (six) hours as needed for mild pain.    ? cholecalciferol (VITAMIN D3) 25 MCG (1000 UNIT) tablet Take 2,000 Units by mouth daily.    ? digoxin (LANOXIN) 0.125 MG tablet Take 1 tablet (0.125 mg total) by mouth every other day. CHF- * Hold for HR < 60 (Patient taking differently: Take 0.125 mg by mouth daily.) 15 tablet 0  ? famotidine (PEPCID) 20 MG tablet Take 20 mg by mouth at bedtime.    ? glipiZIDE (GLUCOTROL) 10 MG tablet Take 10 mg by mouth daily.    ? insulin glargine-yfgn (SEMGLEE) 100 UNIT/ML injection Inject 0.1 mLs (10 Units total) into the skin every evening. (Patient taking differently: Inject 9 Units into the skin 2 (two) times daily.)    ? isosorbide mononitrate (IMDUR) 30 MG 24 hr tablet Take 30 mg by mouth daily.    ? metoprolol succinate (TOPROL-XL) 50 MG 24 hr tablet Take 1 tablet (50 mg total) by mouth daily. 30 tablet 0  ? mirtazapine (REMERON) 15 MG tablet Take 15 mg by mouth at  bedtime.    ?  omeprazole (PRILOSEC) 20 MG capsule Take 20 mg by mouth 2 (two) times daily before a meal.    ? Polyethyl Glycol-Propyl Glycol (SYSTANE) 0.4-0.3 % SOLN Place 1 drop into both eyes as needed (dry eyes).    ? polyethylene glycol (MIRALAX / GLYCOLAX) 17 g packet Take 17 g by mouth daily. 14 each 0  ? potassium chloride SA (KLOR-CON M) 20 MEQ tablet Take 1 tablet (20 mEq total) by mouth daily.  0  ? torsemide (DEMADEX) 20 MG tablet Take 20 mg by mouth daily.    ? metolazone (ZAROXOLYN) 2.5 MG tablet Take 1 tablet (2.5 mg total) by mouth daily as needed. Home med.    ? nitroGLYCERIN (NITROSTAT) 0.4 MG SL tablet Place 0.4 mg under the tongue every 5 (five) minutes as needed for chest pain.    ? ? ?REVIEW OF SYSTEMS:  ? ?[X]  denotes positive finding, [ ]  denotes negative finding ?Cardiac  Comments:  ?Chest pain or chest pressure:    ?Shortness of breath upon exertion:    ?Short of breath when lying flat:    ?Irregular heart rhythm:    ?    ?Vascular    ?Pain in calf, thigh, or hip brought on by ambulation:    ?Pain in feet at night that wakes you up from your sleep:     ?Blood clot in your veins:    ?Leg swelling:     ?    ?Pulmonary    ?Oxygen at home:    ?Productive cough:     ?Wheezing:     ?    ?Neurologic    ?Sudden weakness in arms or legs:     ?Sudden numbness in arms or legs:     ?Sudden onset of difficulty speaking or slurred speech:    ?Temporary loss of vision in one eye:     ?Problems with dizziness:     ?    ?Gastrointestinal    ?Blood in stool:     ? ?Vomited blood:     ?    ?Genitourinary    ?Burning when urinating:     ?Blood in urine:    ?    ?Psychiatric    ?Major depression:     ?    ?Hematologic    ?Bleeding problems:    ?Problems with blood clotting too easily:    ?    ?Skin    ?Rashes or ulcers:    ?    ?Constitutional    ?Fever or chills:    ? ?PHYSICAL EXAM:  ? ?Vitals:  ? 07/25/21 2130 07/25/21 2200 07/25/21 2230 07/25/21 2300  ?BP: (!) 149/85 (!) 127/101 (!) 145/62 (!) 133/54   ?Pulse: 70 83 67 68  ?Resp: (!) 21 19 (!) 43 (!) 22  ?Temp:      ?TempSrc:      ?SpO2: 99% 100% 100% 100%  ?Weight:      ?Height:      ? ? ?GENERAL: The patient is a well-nourished female, in no acute distress.

## 2021-07-27 ENCOUNTER — Inpatient Hospital Stay: Payer: Medicare Other

## 2021-07-27 DIAGNOSIS — I742 Embolism and thrombosis of arteries of the upper extremities: Secondary | ICD-10-CM | POA: Diagnosis not present

## 2021-07-27 LAB — CBC WITH DIFFERENTIAL/PLATELET
Abs Immature Granulocytes: 0.02 10*3/uL (ref 0.00–0.07)
Basophils Absolute: 0 10*3/uL (ref 0.0–0.1)
Basophils Relative: 1 %
Eosinophils Absolute: 0.1 10*3/uL (ref 0.0–0.5)
Eosinophils Relative: 3 %
HCT: 33.6 % — ABNORMAL LOW (ref 36.0–46.0)
Hemoglobin: 10.4 g/dL — ABNORMAL LOW (ref 12.0–15.0)
Immature Granulocytes: 0 %
Lymphocytes Relative: 31 %
Lymphs Abs: 1.4 10*3/uL (ref 0.7–4.0)
MCH: 26.6 pg (ref 26.0–34.0)
MCHC: 31 g/dL (ref 30.0–36.0)
MCV: 85.9 fL (ref 80.0–100.0)
Monocytes Absolute: 0.5 10*3/uL (ref 0.1–1.0)
Monocytes Relative: 12 %
Neutro Abs: 2.4 10*3/uL (ref 1.7–7.7)
Neutrophils Relative %: 53 %
Platelets: 213 10*3/uL (ref 150–400)
RBC: 3.91 MIL/uL (ref 3.87–5.11)
RDW: 14.4 % (ref 11.5–15.5)
WBC: 4.5 10*3/uL (ref 4.0–10.5)
nRBC: 0 % (ref 0.0–0.2)

## 2021-07-27 LAB — COMPREHENSIVE METABOLIC PANEL
ALT: 23 U/L (ref 0–44)
AST: 27 U/L (ref 15–41)
Albumin: 2.8 g/dL — ABNORMAL LOW (ref 3.5–5.0)
Alkaline Phosphatase: 58 U/L (ref 38–126)
Anion gap: 6 (ref 5–15)
BUN: 24 mg/dL — ABNORMAL HIGH (ref 8–23)
CO2: 33 mmol/L — ABNORMAL HIGH (ref 22–32)
Calcium: 8.9 mg/dL (ref 8.9–10.3)
Chloride: 102 mmol/L (ref 98–111)
Creatinine, Ser: 0.91 mg/dL (ref 0.44–1.00)
GFR, Estimated: 58 mL/min — ABNORMAL LOW (ref >60)
Glucose, Bld: 165 mg/dL — ABNORMAL HIGH (ref 70–99)
Potassium: 3.9 mmol/L (ref 3.5–5.1)
Sodium: 141 mmol/L (ref 135–145)
Total Bilirubin: 0.7 mg/dL (ref 0.3–1.2)
Total Protein: 5.5 g/dL — ABNORMAL LOW (ref 6.5–8.1)

## 2021-07-27 LAB — GLUCOSE, CAPILLARY
Glucose-Capillary: 138 mg/dL — ABNORMAL HIGH (ref 70–99)
Glucose-Capillary: 166 mg/dL — ABNORMAL HIGH (ref 70–99)
Glucose-Capillary: 181 mg/dL — ABNORMAL HIGH (ref 70–99)
Glucose-Capillary: 185 mg/dL — ABNORMAL HIGH (ref 70–99)
Glucose-Capillary: 202 mg/dL — ABNORMAL HIGH (ref 70–99)
Glucose-Capillary: 210 mg/dL — ABNORMAL HIGH (ref 70–99)
Glucose-Capillary: 255 mg/dL — ABNORMAL HIGH (ref 70–99)

## 2021-07-27 LAB — HEPARIN LEVEL (UNFRACTIONATED): Heparin Unfractionated: 0.56 IU/mL (ref 0.30–0.70)

## 2021-07-27 LAB — MAGNESIUM: Magnesium: 2.1 mg/dL (ref 1.7–2.4)

## 2021-07-27 LAB — PHOSPHORUS: Phosphorus: 2.7 mg/dL (ref 2.5–4.6)

## 2021-07-27 MED ORDER — IPRATROPIUM-ALBUTEROL 0.5-2.5 (3) MG/3ML IN SOLN
3.0000 mL | RESPIRATORY_TRACT | Status: DC | PRN
Start: 2021-07-27 — End: 2021-08-02
  Administered 2021-07-31: 3 mL via RESPIRATORY_TRACT
  Filled 2021-07-27: qty 3

## 2021-07-27 MED ORDER — GUAIFENESIN 100 MG/5ML PO LIQD: 5.0000 mL | ORAL | Status: DC | PRN

## 2021-07-27 MED ORDER — SENNOSIDES-DOCUSATE SODIUM 8.6-50 MG PO TABS
2.0000 | ORAL_TABLET | Freq: Two times a day (BID) | ORAL | Status: DC
  Administered 2021-07-27 – 2021-08-02 (×11): 2 via ORAL
  Filled 2021-07-27 (×16): qty 2

## 2021-07-27 MED ORDER — POLYVINYL ALCOHOL 1.4 % OP SOLN
1.0000 [drp] | OPHTHALMIC | Status: DC | PRN
  Administered 2021-07-28: 1 [drp] via OPHTHALMIC
  Filled 2021-07-27 (×2): qty 15

## 2021-07-27 MED ORDER — METOPROLOL TARTRATE 5 MG/5ML IV SOLN: 5.0000 mg | INTRAVENOUS | Status: DC | PRN

## 2021-07-27 MED ORDER — POLYETHYLENE GLYCOL 3350 17 G PO PACK
17.0000 g | PACK | Freq: Two times a day (BID) | ORAL | Status: DC
  Administered 2021-07-27 – 2021-08-02 (×10): 17 g via ORAL
  Filled 2021-07-27 (×12): qty 1

## 2021-07-27 MED ORDER — HYDRALAZINE HCL 20 MG/ML IJ SOLN
10.0000 mg | INTRAMUSCULAR | Status: DC | PRN
Start: 2021-07-27 — End: 2021-08-02

## 2021-07-27 NOTE — Progress Notes (Signed)
Mobility Specialist - Progress Note ? ? 07/27/21 1306  ?Mobility  ?Activity Ambulated with assistance in hallway;Ambulated independently in room;Stood at bedside;Dangled on edge of bed;Transferred to/from [facility]  ?Level of Assistance Standby assist, set-up cues, supervision of patient - no hands on  ?Assistive Device Front wheel walker  ?Distance Ambulated (ft) 40 ft  ?Activity Response Tolerated well  ?$Mobility charge 1 Mobility  ? ? ? ?Pre-mobility: 89 HR,  ?During mobility: 96 HR,  ? ?Pt supine upon arrival using RA. Completes bed mobility ModI + extra time. STS completed with MinA and ambulates 55f to NS with supervision. Pt returns to sink and stands ~ 2 minutes with supervision (no LOB noted). Ambulates 112fto bathroom for urinary output and returns to bed with needs in reach. RN notified. ? ?MaMerrily BrittleMobility Specialist ?07/27/21, 1:09 PM ? ? ? ? ?

## 2021-07-27 NOTE — Progress Notes (Signed)
? ? ?  Subjective  -  ? ?No complaints of pain in her right arm ? ? ?Physical Exam: ? ?Palpable right brachial pulse.  Nonpalpable radial or ulnar pulses. ? ?Normal motor and sensory function in the right arm ? ? ? ? ? ? ?Assessment/Plan:  ? 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  She is on heparin.  She is completely asymptomatic.  Therefore I do not think in a frail 88 year old that surgical exploration is warranted at this time.  I have discussed this with the patient and her nephew, Ruthann Cancer.  I would begin transition from heparin to Eliquis. ? ?Annamarie Major ?07/27/2021 ?4:53 PM ?-- ? ?Vitals:  ? 07/27/21 1114 07/27/21 1618  ?BP: (!) 147/97 (!) 136/92  ?Pulse: 97 83  ?Resp: 19 18  ?Temp: 98.1 ?F (36.7 ?C) 98.2 ?F (36.8 ?C)  ?SpO2: 99% 98%  ? ? ?Intake/Output Summary (Last 24 hours) at 07/27/2021 1653 ?Last data filed at 07/27/2021 1320 ?Gross per 24 hour  ?Intake 1433.1 ml  ?Output --  ?Net 1433.1 ml  ? ? ? ?Laboratory ?CBC ?   ?Component Value Date/Time  ? WBC 4.5 07/27/2021 0431  ? HGB 10.4 (L) 07/27/2021 0431  ? HGB 12.6 08/08/2012 1055  ? HCT 33.6 (L) 07/27/2021 0431  ? HCT 37.2 08/08/2012 1055  ? PLT 213 07/27/2021 0431  ? PLT 258 08/08/2012 1055  ? ? ?BMET ?   ?Component Value Date/Time  ? NA 141 07/27/2021 0431  ? NA 138 08/08/2012 1055  ? K 3.9 07/27/2021 0431  ? K 4.2 08/08/2012 1055  ? CL 102 07/27/2021 0431  ? CL 103 08/08/2012 1055  ? CO2 33 (H) 07/27/2021 0431  ? CO2 17 (L) 08/08/2012 1055  ? GLUCOSE 165 (H) 07/27/2021 0431  ? GLUCOSE 228 (H) 08/08/2012 1055  ? BUN 24 (H) 07/27/2021 0431  ? BUN 18 08/08/2012 1055  ? CREATININE 0.91 07/27/2021 0431  ? CREATININE 0.72 08/08/2012 1055  ? CALCIUM 8.9 07/27/2021 0431  ? CALCIUM 10.3 (H) 08/08/2012 1055  ? GFRNONAA 58 (L) 07/27/2021 0431  ? GFRNONAA >60 08/08/2012 1055  ? GFRAA >60 10/24/2019 1123  ? GFRAA >60 08/08/2012 1055  ? ? ?COAG ?Lab Results  ?Component Value Date  ? INR 1.0 04/25/2021  ? INR 1.1 03/21/2021   ? INR 1.1 07/02/2019  ? ?No results found for: PTT ? ?Antibiotics ?Anti-infectives (From admission, onward)  ? ? None  ? ?  ? ? ? ?V. Leia Alf, M.D., FACS ?[facility] ?Office: [phone] ?Pager:  [phone]  ?

## 2021-07-27 NOTE — Evaluation (Signed)
Physical Therapy Evaluation ?Patient Details ?Name: Elizabeth Novak ?MRN: [id number] ?DOB: 1923-08-29 ?Today's Date: 07/27/2021 ? ?History of Present Illness ? Pt is a 88 y/o F admitted on 07/25/21 after presenting with c/o sudden onset of RUE pain. Emergent CT angiogram of the right upper extremity showed abrupt cut off of the distal radial artery with some reconstitution of the forearm vessels. Pt was seen by vascular surgery in the ER, started on heparin infusion overnight; pt is currently being managed conservatively as she is not a good surgical candidate. PMH: SSS s/p pacemaker, HTN, HLD, insulin dependent DM2, depression, dementia, GERD  ?Clinical Impression ? Pt received in bed attempting to eat lunch with PT offering to assist pt to recliner for improved positioning for eating & pt agreeable. Pt reports prior to admission she was at ALF & able to ambulate to dining room with RW without assistance. On this date, pt completes supine>sit & STS with RW & mod I. Pt ambulates around bed to recliner with RW & supervision with decreased gait speed. Pt then set up with meal tray per her request. Of note, mobility specialist reports pt just ambulated into hallway & back with good tolerance prior to PT arrival. Will continue to follow pt acutely to address endurance & gait. ?   ? ?Recommendations for follow up therapy are one component of a multi-disciplinary discharge planning process, led by the attending physician.  Recommendations may be updated based on patient status, additional functional criteria and insurance authorization. ? ?Follow Up Recommendations Home health PT ? ?  ?Assistance Recommended at Discharge Frequent or constant Supervision/Assistance  ?Patient can return home with the following ? A little help with walking and/or transfers;A little help with bathing/dressing/bathroom;Assistance with cooking/housework;Assist for transportation;Direct supervision/assist for medications management;Help with  stairs or ramp for entrance ? ?  ?Equipment Recommendations None recommended by PT  ?Recommendations for Other Services ?    ?  ?Functional Status Assessment Patient has had a recent decline in their functional status and demonstrates the ability to make significant improvements in function in a reasonable and predictable amount of time.  ? ?  ?Precautions / Restrictions Precautions ?Precautions: Fall ?Restrictions ?Weight Bearing Restrictions: No  ? ?  ? ?Mobility ? Bed Mobility ?Overal bed mobility: Modified Independent ?  ?  ?  ?  ?  ?  ?General bed mobility comments: supine>sit with HOB moderately elevated & bed rails\ ?  ? ?Transfers ?Overall transfer level: Modified independent ?Equipment used: Rolling walker (2 wheels) ?Transfers: Sit to/from Stand ?Sit to Stand: Modified independent (Device/Increase time) ?  ?  ?  ?  ?  ?General transfer comment: STS from EOB ?  ? ?Ambulation/Gait ?Ambulation/Gait assistance: Supervision ?Gait Distance (Feet): 10 Feet ?Assistive device: Rolling walker (2 wheels) ?Gait Pattern/deviations: Decreased step length - right, Decreased stride length, Decreased step length - left ?Gait velocity: decreased ?  ?  ?  ? ?Stairs ?  ?  ?  ?  ?  ? ?Wheelchair Mobility ?  ? ?Modified Rankin (Stroke Patients Only) ?  ? ?  ? ?Balance Overall balance assessment: Needs assistance ?Sitting-balance support: Feet supported ?Sitting balance-Leahy Scale: Fair ?  ?  ?Standing balance support: Bilateral upper extremity supported, During functional activity ?Standing balance-Leahy Scale: Fair ?  ?  ?  ?  ?  ?  ?  ?  ?  ?  ?  ?  ?   ? ? ? ?Pertinent Vitals/Pain Pain Assessment ?Pain Assessment: No/denies pain  ? ? ?  Home Living Family/patient expects to be discharged to:: Assisted living ?  ?  ?  ?  ?  ?  ?  ?  ?Home Equipment: Agricultural consultant (2 wheels) ?   ?  ?Prior Function   ?  ?  ?  ?  ?  ?  ?Mobility Comments: Pt reports she ambulates to dining room with RW without assistance. ?  ?  ? ? ?Hand  Dominance  ?   ? ?  ?Extremity/Trunk Assessment  ? Upper Extremity Assessment ?Upper Extremity Assessment:  (bruising noted on R elbow, strength not tested) ?  ? ?Lower Extremity Assessment ?Lower Extremity Assessment: Generalized weakness ?  ? ?   ?Communication  ? Communication: HOH  ?Cognition Arousal/Alertness: Awake/alert ?Behavior During Therapy: [facility] for tasks assessed/performed ?Overall Cognitive Status: Within Functional Limits for tasks assessed ?  ?  ?  ?  ?  ?  ?  ?  ?  ?  ?  ?  ?  ?  ?  ?  ?General Comments: Follows commands throughout session, appropriate throughout, pleasant. ?  ?  ? ?  ?General Comments   ? ?  ?Exercises    ? ?Assessment/Plan  ?  ?PT Assessment Patient needs continued PT services  ?PT Problem List Decreased strength;Decreased mobility;Decreased activity tolerance;Decreased balance;Cardiopulmonary status limiting activity ? ?   ?  ?PT Treatment Interventions DME instruction;Modalities;Therapeutic activities;Gait training;Therapeutic exercise;Balance training;Functional mobility training;Neuromuscular re-education;Patient/family education   ? ?PT Goals (Current goals can be found in the Care Plan section)  ?Acute Rehab PT Goals ?Patient Stated Goal: eat lunch, RUE get better ?PT Goal Formulation: With patient ?Time For Goal Achievement: 08/10/21 ?Potential to Achieve Goals: Good ? ?  ?Frequency Min 2X/week ?  ? ? ?Co-evaluation   ?  ?  ?  ?  ? ? ?  ?AM-PAC PT "6 Clicks" Mobility  ?Outcome Measure Help needed turning from your back to your side while in a flat bed without using bedrails?: None ?Help needed moving from lying on your back to sitting on the side of a flat bed without using bedrails?: A Little ?Help needed moving to and from a bed to a chair (including a wheelchair)?: A Little ?Help needed standing up from a chair using your arms (e.g., wheelchair or bedside chair)?: None ?Help needed to walk in hospital room?: A Little ?Help needed climbing 3-5 steps with a railing? : A  Little ?6 Click Score: 20 ? ?  ?End of Session   ?Activity Tolerance: Patient tolerated treatment well (limited by wanting to eat lunch) ?Patient left: in chair;with chair alarm set;with call bell/phone within reach ?Nurse Communication: Mobility status ?PT Visit Diagnosis: Muscle weakness (generalized) (M62.81) ?  ? ?Time: 1610-9604 ?PT Time Calculation (min) (ACUTE ONLY): 12 min ? ? ?Charges:   PT Evaluation ?$PT Eval Moderate Complexity: 1 Mod ?  ?  ?   ? ? ?Aleda Grana, PT, DPT ?07/27/21, 1:03 PM ? ? ?Sandi Mariscal ?07/27/2021, 1:02 PM ? ?

## 2021-07-27 NOTE — Progress Notes (Signed)
?PROGRESS NOTE ? ? ? ?Female Elizabeth Novak  MVH:[id number] DOB: 12-Apr-1923 DOA: 07/25/2021 ?PCP: [facility] Browner [facility]  ? ?Brief Narrative:  ?88 year old female with history of sick sinus syndrome status post pacemaker, hypertension, hyperlipidemia, insulin-dependent type 2 diabetes, depression, dementia, GERD brought from assisted living facility with sudden onset of right upper extremity pain.  In the emergency room she is hemodynamically stable on room air.  Emergent CT angiogram of the right upper extremity showed abrupt cut off of the distal radial artery with some reconstitution of the forearm vessels as above.  There was concern about possible embolus.  Decreased perfusion of the lower pole of the right kidney. ?Patient does have a history of paroxysmal A-fib and she was taken off anticoagulation due to fall risk and frailty.  Seen by vascular surgery in the emergency room, started on heparin infusion overnight.  Currently this will be managed conservatively as she is not a good surgical candidate. ?  ? ? ?Assessment & Plan: ? Principal Problem: ?  Acute occlusion of artery of upper extremity due to thrombus (HCC) ?Active Problems: ?  Atrial fibrillation (HCC) ?  Sick sinus syndrome (HCC) ?  GERD without esophagitis ?  Chronic constipation ?  Dyslipidemia associated with type 2 diabetes mellitus (HCC) ?  Anxiety with depression ?  Congestive heart failure due to valvular disease (HCC) ?  S/P placement of cardiac pacemaker ?  HTN (hypertension) ?  HLD (hyperlipidemia) ?  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  ? ?* Acute occlusion of artery of upper extremity due to thrombus [facility]) ?-Seen by vascular surgeon.  Recommending conservative management with heparin drip and eventually switching her over to Eliquis.  Not a good surgical candidate. ? ?Renal infarct, right side ?- Seen on CT, continue anticoagulation ? ?Abdominal distention and bloating ?- Stat KUB ordered ? ?Congestive  heart failure with grade 3 DD.  Restrictive cardiomyopathy ?Severe MR and TR ?-Currently on Imdur and torsemide ? ?Constipation ?- Bowel regimen ? ?HTN (hypertension) ?-Imdur 30 mg daily, torsemide daily ? ?Anxiety with depression ?-Mirtazapine 15 mg at bedtime ? ?Chronic constipation ?-Bowel regimen ? ?GERD without esophagitis ?-Currently on Protonix and Pepcid ? ?Sick sinus syndrome (HCC) ?- Status post cardiac device implementation ?-Continue digoxin 0.125 mg daily ? ?Paroxysmal atrial fibrillation (HCC) ?-Currently anticoagulated ?  ? ? ? ?DVT prophylaxis: Place TED hose Start: 07/25/21 2215 ?Code Status: DNR ?Family Communication:   ? ?Status is: Inpatient ?Remains inpatient appropriate because: Cont IV Heparin  ? ? ? ? ? ?Subjective: ?Patient reports of some abnormal sensation in her right upper and left upper extremity.  Denies any neck pain at this time. ? ? ?Examination: ? ?General exam: Elderly frail, not in acute distress ?Respiratory system: Clear to auscultation. Respiratory effort normal. ?Cardiovascular system: S1 & S2 heard, RRR. No JVD, murmurs, rubs, gallops or clicks. No pedal edema. ?Gastrointestinal system: Abdomen is nondistended, soft and nontender. No organomegaly or masses felt. Normal bowel sounds heard. ?Central nervous system: Alert awake oriented X4.  Slightly diminished sensation in left upper extremity but overall intact ?Extremities: Strength in bilateral upper extremity 4/5. ?Skin: No rashes, lesions or ulcers ?Psychiatry: Judgement and insight appear normal. Mood & affect appropriate.  ? ? ? ?Objective: ?Vitals:  ? 07/26/21 1949 07/26/21 2327 07/27/21 0410 07/27/21 0751  ?BP: 115/61 112/67 124/68 (!) 138/99  ?Pulse: 81 65 66 66  ?Resp: 20 19 17 17   ?Temp: 98.8 ?F (37.1 ?C) 98.7 ?F (37.1 ?C) 98.4 ?F (36.9 ?  C) 98.4 ?F (36.9 ?C)  ?TempSrc:  Oral Oral   ?SpO2: 97% 98% 99% 97%  ?Weight:      ?Height:      ? ? ?Intake/Output Summary (Last 24 hours) at 07/27/2021 0844 ?Last data filed at  07/26/2021 2129 ?Gross per 24 hour  ?Intake 527.61 ml  ?Output --  ?Net 527.61 ml  ? ?Filed Weights  ? 07/25/21 1855  ?Weight: 50 kg  ? ? ? ?Data Reviewed:  ? ?CBC: ?Recent Labs  ?Lab 07/25/21 ?1934 07/26/21 ?1610 07/27/21 ?0431  ?WBC 4.9 5.0 4.5  ?NEUTROABS 3.3  --  2.4  ?HGB 12.3 10.6* 10.4*  ?HCT 39.9 33.8* 33.6*  ?MCV 89.1 85.1 85.9  ?PLT 245 232 213  ? ?Basic Metabolic Panel: ?Recent Labs  ?Lab 07/25/21 ?1934 07/26/21 ?9604 07/27/21 ?0431  ?NA 141 142 141  ?K 4.1 3.2* 3.9  ?CL 98 103 102  ?CO2 32 33* 33*  ?GLUCOSE 246* 86 165*  ?BUN 30* 25* 24*  ?CREATININE 1.03* 0.82 0.91  ?CALCIUM 9.3 8.9 8.9  ?MG  --   --  2.1  ?PHOS  --   --  2.7  ? ?GFR: ?Estimated Creatinine Clearance: 29.2 mL/min (by C-G formula based on SCr of 0.91 mg/dL). ?Liver Function Tests: ?Recent Labs  ?Lab 07/27/21 ?0431  ?AST 27  ?ALT 23  ?ALKPHOS 58  ?BILITOT 0.7  ?PROT 5.5*  ?ALBUMIN 2.8*  ? ?No results for input(s): LIPASE, AMYLASE in the last 168 hours. ?No results for input(s): AMMONIA in the last 168 hours. ?Coagulation Profile: ?No results for input(s): INR, PROTIME in the last 168 hours. ?Cardiac Enzymes: ?No results for input(s): CKTOTAL, CKMB, CKMBINDEX, TROPONINI in the last 168 hours. ?BNP (last 3 results) ?No results for input(s): PROBNP in the last 8760 hours. ?HbA1C: ?No results for input(s): HGBA1C in the last 72 hours. ?CBG: ?Recent Labs  ?Lab 07/26/21 ?1625 07/26/21 ?1932 07/27/21 ?0000 07/27/21 ?0408 07/27/21 ?0750  ?GLUCAP 198* 188* 185* 166* 138*  ? ?Lipid Profile: ?No results for input(s): CHOL, HDL, LDLCALC, TRIG, CHOLHDL, LDLDIRECT in the last 72 hours. ?Thyroid Function Tests: ?No results for input(s): TSH, T4TOTAL, FREET4, T3FREE, THYROIDAB in the last 72 hours. ?Anemia Panel: ?No results for input(s): VITAMINB12, FOLATE, FERRITIN, TIBC, IRON, RETICCTPCT in the last 72 hours. ?Sepsis Labs: ?No results for input(s): PROCALCITON, LATICACIDVEN in the last 168 hours. ? ?No results found for this or any previous visit (from  the past 240 hour(s)).  ? ? ? ? ? ?Radiology Studies: ?CT Head Wo Contrast ? ?Result Date: 07/25/2021 ?CLINICAL DATA:  Right arm numbness EXAM: CT HEAD WITHOUT CONTRAST TECHNIQUE: Contiguous axial images were obtained from the base of the skull through the vertex without intravenous contrast. RADIATION DOSE REDUCTION: This exam was performed according to the departmental dose-optimization program which includes automated exposure control, adjustment of the mA and/or kV according to patient size and/or use of iterative reconstruction technique. COMPARISON:  04/25/2021 FINDINGS: Brain: No evidence of acute infarction, hemorrhage, hydrocephalus, extra-axial collection or mass lesion/mass effect. Subcortical white matter and periventricular small vessel ischemic changes, particularly in the subcortical frontal lobes and right posterior parietal lobe. Vascular: Intracranial atherosclerosis. Skull: Normal. Negative for fracture or focal lesion. Sinuses/Orbits: The visualized paranasal sinuses are essentially clear. The mastoid air cells are unopacified. Other: None. IMPRESSION: No evidence of acute intracranial abnormality. Small vessel ischemic changes. Electronically Signed   By: Charline Bills M.D.   On: 07/25/2021 20:56  ? ?CT ANGIO UP EXTREM RIGHT W &/OR  WO CONTRAST ? ?Result Date: 07/25/2021 ?CLINICAL DATA:  Right arm pain EXAM: CT ANGIOGRAPHY OF THE RIGHT UPPER EXTREMITY TECHNIQUE: Multidetector CT imaging of the right upper extremity was performed using the standard protocol during bolus administration of intravenous contrast. Multiplanar CT image reconstructions and MIPs were obtained to evaluate the vascular anatomy. RADIATION DOSE REDUCTION: This exam was performed according to the departmental dose-optimization program which includes automated exposure control, adjustment of the mA and/or kV according to patient size and/or use of iterative reconstruction technique. CONTRAST:  OMNIPAQUE IOHEXOL 350  MG/ML SOLN COMPARISON:  None. FINDINGS: Right subclavian artery and axillary artery are widely patent and unremarkable. There is abrupt cut off of the distal right brachial artery just above the elbow. Some

## 2021-07-27 NOTE — Progress Notes (Incomplete)
[facility] Cardiology ? ? ? ?SUBJECTIVE: *** ? ? ?Vitals:  ? 07/26/21 1949 07/26/21 2327 07/27/21 0410 07/27/21 0751  ?BP: 115/61 112/67 124/68 (!) 138/99  ?Pulse: 81 65 66 66  ?Resp: 20 19 17 17   ?Temp: 98.8 ?F (37.1 ?C) 98.7 ?F (37.1 ?C) 98.4 ?F (36.9 ?C) 98.4 ?F (36.9 ?C)  ?TempSrc:  Oral Oral   ?SpO2: 97% 98% 99% 97%  ?Weight:      ?Height:      ? ? ? ?Intake/Output Summary (Last 24 hours) at 07/27/2021 1029 ?Last data filed at 07/27/2021 1011 ?Gross per 24 hour  ?Intake 1247.61 ml  ?Output --  ?Net 1247.61 ml  ? ? ? ? ?PHYSICAL EXAM ? ?General: Well developed, well nourished, in no acute distress ?HEENT:  Normocephalic and atramatic ?Neck:  No JVD.  ?Lungs: Clear bilaterally to auscultation and percussion. ?Heart: HRRR . Normal S1 and S2 without gallops or murmurs.  ?Abdomen: Bowel sounds are positive, abdomen soft and non-tender  ?Msk:  Back normal, normal gait. Normal strength and tone for age. ?Extremities: No clubbing, cyanosis or edema.   ?Neuro: Alert and oriented X 3. ?Psych:  Good affect, responds appropriately ? ? ?LABS: ?Basic Metabolic Panel: ?Recent Labs  ?  07/26/21 ?4259 07/27/21 ?0431  ?NA 142 141  ?K 3.2* 3.9  ?CL 103 102  ?CO2 33* 33*  ?GLUCOSE 86 165*  ?BUN 25* 24*  ?CREATININE 0.82 0.91  ?CALCIUM 8.9 8.9  ?MG  --  2.1  ?PHOS  --  2.7  ? ?Liver Function Tests: ?Recent Labs  ?  07/27/21 ?0431  ?AST 27  ?ALT 23  ?ALKPHOS 58  ?BILITOT 0.7  ?PROT 5.5*  ?ALBUMIN 2.8*  ? ?No results for input(s): LIPASE, AMYLASE in the last 72 hours. ?CBC: ?Recent Labs  ?  07/25/21 ?1934 07/26/21 ?5638 07/27/21 ?0431  ?WBC 4.9 5.0 4.5  ?NEUTROABS 3.3  --  2.4  ?HGB 12.3 10.6* 10.4*  ?HCT 39.9 33.8* 33.6*  ?MCV 89.1 85.1 85.9  ?PLT 245 232 213  ? ?Cardiac Enzymes: ?No results for input(s): CKTOTAL, CKMB, CKMBINDEX, TROPONINI in the last 72 hours. ?BNP: ?Invalid input(s): POCBNP ?D-Dimer: ?No results for input(s): DDIMER in the last 72 hours. ?Hemoglobin A1C: ?No results for input(s): HGBA1C in the last 72 hours. ?Fasting Lipid  Panel: ?No results for input(s): CHOL, HDL, LDLCALC, TRIG, CHOLHDL, LDLDIRECT in the last 72 hours. ?Thyroid Function Tests: ?No results for input(s): TSH, T4TOTAL, T3FREE, THYROIDAB in the last 72 hours. ? ?Invalid input(s): FREET3 ?Anemia Panel: ?No results for input(s): VITAMINB12, FOLATE, FERRITIN, TIBC, IRON, RETICCTPCT in the last 72 hours. ? ?CT Head Wo Contrast ? ?Result Date: 07/25/2021 ?CLINICAL DATA:  Right arm numbness EXAM: CT HEAD WITHOUT CONTRAST TECHNIQUE: Contiguous axial images were obtained from the base of the skull through the vertex without intravenous contrast. RADIATION DOSE REDUCTION: This exam was performed according to the departmental dose-optimization program which includes automated exposure control, adjustment of the mA and/or kV according to patient size and/or use of iterative reconstruction technique. COMPARISON:  04/25/2021 FINDINGS: Brain: No evidence of acute infarction, hemorrhage, hydrocephalus, extra-axial collection or mass lesion/mass effect. Subcortical white matter and periventricular small vessel ischemic changes, particularly in the subcortical frontal lobes and right posterior parietal lobe. Vascular: Intracranial atherosclerosis. Skull: Normal. Negative for fracture or focal lesion. Sinuses/Orbits: The visualized paranasal sinuses are essentially clear. The mastoid air cells are unopacified. Other: None. IMPRESSION: No evidence of acute intracranial abnormality. Small vessel ischemic changes. Electronically Signed  By: Charline Bills M.D.   On: 07/25/2021 20:56  ? ?CT ANGIO UP EXTREM RIGHT W &/OR WO CONTRAST ? ?Result Date: 07/25/2021 ?CLINICAL DATA:  Right arm pain EXAM: CT ANGIOGRAPHY OF THE RIGHT UPPER EXTREMITY TECHNIQUE: Multidetector CT imaging of the right upper extremity was performed using the standard protocol during bolus administration of intravenous contrast. Multiplanar CT image reconstructions and MIPs were obtained to evaluate the vascular anatomy.  RADIATION DOSE REDUCTION: This exam was performed according to the departmental dose-optimization program which includes automated exposure control, adjustment of the mA and/or kV according to patient size and/or use of iterative reconstruction technique. CONTRAST:  OMNIPAQUE IOHEXOL 350 MG/ML SOLN COMPARISON:  None. FINDINGS: Right subclavian artery and axillary artery are widely patent and unremarkable. There is abrupt cut off of the distal right brachial artery just above the elbow. Some contrast is seen in the ulnar in interosseous artery in the forearm compatible with some reconstitution of distal flow. No flow seen in the radial artery. 3 mm nodule in the right upper lobe on image 28. Adjacent 2 mm nodule on image 31. Visualized right lung otherwise clear. No pleural effusion. No acute bony abnormality. In the visualized right abdomen, there is an area of decreased perfusion seen in the lower pole of the right kidney. Cannot exclude renal infarct. Gallstones seen within the gallbladder. Review of the MIP images confirms the above findings. IMPRESSION: Abrupt cut off of the distal right radial artery with some reconstitution of the forearm vessels as above. This is concerning for possible embolus. Recommend vascular surgical consultation. Area of decreased perfusion in the lower pole of the right kidney. Cannot exclude embolic involvement and infarct. Cholelithiasis. Critical Value/emergent results were called by telephone at the time of interpretation on 07/25/2021 at 9:32 pm to provider [facility] , who verbally acknowledged these results. Electronically Signed   By: Charlett Nose M.D.   On: 07/25/2021 21:40  ? ?ECHOCARDIOGRAM COMPLETE ? ?Result Date: 07/26/2021 ?   ECHOCARDIOGRAM REPORT   Patient Name:   Elizabeth Novak [facility] Date of Exam: 07/26/2021 Medical Rec #:  [id number]          Height:       64.0 in Accession #:    [id number]         Weight:       110.2 lb Date of Birth:  1924-01-03          BSA:           1.519 m? Patient Age:    88 years           BP:           110/44 mmHg Patient Gender: F                  HR:           65 bpm. Exam Location:  [facility] Procedure: 2D Echo Indications:     Other abnormalities of the heart R00.8  History:         Patient has prior history of Echocardiogram examinations, most                  recent 07/02/2021.  Sonographer:     Overton Mam RDCS Referring Phys:  [id number] Dorcas Carrow Diagnosing Phys: Alwyn Pea MD IMPRESSIONS  1. Left ventricular ejection fraction, by estimation, is 65 to 70%. The left ventricle has normal function. The left ventricle has no regional wall motion abnormalities. Left ventricular diastolic  parameters are consistent with Grade III diastolic dysfunction (restrictive).  2. Right ventricular systolic function is normal. The right ventricular size is mildly enlarged.  3. Left atrial size was severely dilated.  4. Right atrial size was severely dilated.  5. The mitral valve is myxomatous. Severe mitral valve regurgitation. There is mild prolapse of of the mitral valve.  6. Tricuspid valve regurgitation is severe.  7. The aortic valve is grossly normal. Aortic valve regurgitation is not visualized. FINDINGS  Left Ventricle: Left ventricular ejection fraction, by estimation, is 65 to 70%. The left ventricle has normal function. The left ventricle has no regional wall motion abnormalities. The left ventricular internal cavity size was normal in size. There is  borderline concentric left ventricular hypertrophy. Left ventricular diastolic parameters are consistent with Grade III diastolic dysfunction (restrictive). Right Ventricle: The right ventricular size is mildly enlarged. No increase in right ventricular wall thickness. Right ventricular systolic function is normal. Left Atrium: Left atrial size was severely dilated. Right Atrium: Right atrial size was severely dilated. Pericardium: There is no evidence of pericardial effusion. Mitral Valve: The  mitral valve is myxomatous. There is mild prolapse of of the mitral valve. Mild mitral annular calcification. Severe mitral valve regurgitation. MV peak gradient, 5.9 mmHg. The mean mitral valve grad

## 2021-07-27 NOTE — Progress Notes (Signed)
Mobility Specialist - Progress Note ? ? ? 07/27/21 1500  ?Mobility  ?Activity Ambulated with assistance to bathroom;Stood at bedside;Transferred to/from [facility]  ?Level of Assistance Standby assist, set-up cues, supervision of patient - no hands on  ?Assistive Device Front wheel walker  ?Distance Ambulated (ft) 20 ft  ?Activity Response Tolerated well  ?$Mobility charge 1 Mobility  ? ? ?Pt in recliner upon arrival using RA. Pt completes STS with MinA and ambulates to bathroom with supervision for urinary output. Pt returns to recliner and descends with ModI + vc for safe hand placement. Pt is left in recliner with needs in reach and alarm set. ? ?Elizabeth Novak ?Mobility Specialist ?07/27/21, 3:31 PM ? ? ? ? ?

## 2021-07-27 NOTE — Consult Note (Signed)
ANTICOAGULATION CONSULT NOTE  ? ?Pharmacy Consult for heparin ?Indication: DVT ? ?Allergies  ?Allergen Reactions  ? Codeine Nausea Only  ? Disopyramide   ?  Other reaction(s): Unknown  ? Ibuprofen Diarrhea  ? Iodine   ?  blisters  ? Metformin And Related Other (See Comments)  ?  unknown  ? Norpace [Disopyramide Phosphate]   ? Quinidine   ?  Other reaction(s): Unknown  ? Terfenadine   ?  Other reaction(s): Unknown  ? Topiramate   ?  Other reaction(s): Other (See Comments) ?Hair loss  ? Verapamil   ?  Other reaction(s): Unknown  ? Dexamethasone Sodium Phosphate Palpitations  ? ? ?Patient Measurements: ?Height: '5\' 4"'$  (162.6 cm) ?Weight: 50 kg (110 lb 3.7 oz) ?IBW/kg (Calculated) : 54.7 ?Heparin Dosing Weight: 50 kg ? ?Vital Signs: ?Temp: 98.4 ?F (36.9 ?C) (04/23 0410) ?Temp Source: Oral (04/23 0410) ?BP: 124/68 (04/23 0410) ?Pulse Rate: 66 (04/23 0410) ? ?Labs: ?Recent Labs  ?  07/25/21 ?1934 07/26/21 ?6237 07/26/21 ?1325 07/27/21 ?0431  ?HGB 12.3 10.6*  --  10.4*  ?HCT 39.9 33.8*  --  33.6*  ?PLT 245 232  --  213  ?HEPARINUNFRC  --  0.48 0.48 0.56  ?CREATININE 1.03* 0.82  --  0.91  ? ? ? ?Estimated Creatinine Clearance: 29.2 mL/min (by C-G formula based on SCr of 0.91 mg/dL). ? ? ?Medical History: ?Past Medical History:  ?Diagnosis Date  ? A-fib ([place])   ? Anemia   ? Arthritis   ? Breast cancer ([place]) 1978  ? left breast with lymph node removal  ? CHF (congestive heart failure) ([place])   ? Diabetes mellitus without complication ([place])   ? Diverticulitis   ? Dysrhythmia   ? GERD (gastroesophageal reflux disease)   ? Hyperlipidemia   ? Macular degeneration of both eyes   ? Mitral valve prolapse   ? Mitral valve regurgitation   ? Presence of permanent cardiac pacemaker   ? ? ?Medications:  ?PTA:  ?Inpatient: Heparin drip  ?Allergies: No AC/APT related allergies ? ?Assessment: ?88 y.o. female with past medical history of hypertension, diabetes, CHF, atrial fibrillation, sick sinus syndrome status post pacemaker, and stroke who  presents to the ED complaining of abdominal pain. Pharmacy consulted for heparin drip in the setting of DVT. ? ?4/21: CT Angio of Right upper extremity: Concerning for possible embolus."  ? ?Date Time aPTT/HL Rate/Comment ?4/21 2200   3500u x1, 850u/hr ?4/22 0533 -- / 0.48 Therapeutic x 1   ?4/22 1325 -- / 0.48 Therapeutic  ?4/23 0431 -- / 0.56 Therapeutic ? ?Goal of Therapy:  ?Heparin level 0.3-0.7 units/ml ?Monitor platelets by anticoagulation protocol: Yes ?  ?Plan:  ?Heparin level is therapeutic. Continue heparin infusion at 850 units/hr. Recheck heparin level and CBC with AM labs.  ? ?Thank you for allowing pharmacy to be a part of this pleasant patient?s care. ? ?Renda Rolls, PharmD, MBA ?07/27/2021 ?5:46 AM ? ? ? ? ? ?

## 2021-07-28 DIAGNOSIS — I742 Embolism and thrombosis of arteries of the upper extremities: Secondary | ICD-10-CM | POA: Diagnosis not present

## 2021-07-28 LAB — HEPARIN LEVEL (UNFRACTIONATED): Heparin Unfractionated: 0.41 IU/mL (ref 0.30–0.70)

## 2021-07-28 LAB — CBC
HCT: 34.5 % — ABNORMAL LOW (ref 36.0–46.0)
Hemoglobin: 11.1 g/dL — ABNORMAL LOW (ref 12.0–15.0)
MCH: 27.5 pg (ref 26.0–34.0)
MCHC: 32.2 g/dL (ref 30.0–36.0)
MCV: 85.6 fL (ref 80.0–100.0)
Platelets: 210 10*3/uL (ref 150–400)
RBC: 4.03 MIL/uL (ref 3.87–5.11)
RDW: 14.1 % (ref 11.5–15.5)
WBC: 4.1 10*3/uL (ref 4.0–10.5)
nRBC: 0 % (ref 0.0–0.2)

## 2021-07-28 LAB — BASIC METABOLIC PANEL
Anion gap: 7 (ref 5–15)
BUN: 20 mg/dL (ref 8–23)
CO2: 30 mmol/L (ref 22–32)
Calcium: 9.2 mg/dL (ref 8.9–10.3)
Chloride: 104 mmol/L (ref 98–111)
Creatinine, Ser: 0.83 mg/dL (ref 0.44–1.00)
GFR, Estimated: >60 mL/min (ref >60)
Glucose, Bld: 205 mg/dL — ABNORMAL HIGH (ref 70–99)
Potassium: 4.4 mmol/L (ref 3.5–5.1)
Sodium: 141 mmol/L (ref 135–145)

## 2021-07-28 LAB — GLUCOSE, CAPILLARY
Glucose-Capillary: 196 mg/dL — ABNORMAL HIGH (ref 70–99)
Glucose-Capillary: 180 mg/dL — ABNORMAL HIGH (ref 70–99)
Glucose-Capillary: 148 mg/dL — ABNORMAL HIGH (ref 70–99)
Glucose-Capillary: 205 mg/dL — ABNORMAL HIGH (ref 70–99)
Glucose-Capillary: 224 mg/dL — ABNORMAL HIGH (ref 70–99)

## 2021-07-28 LAB — MAGNESIUM: Magnesium: 2.2 mg/dL (ref 1.7–2.4)

## 2021-07-28 MED ORDER — APIXABAN 5 MG PO TABS
5.0000 mg | ORAL_TABLET | Freq: Two times a day (BID) | ORAL | Status: DC
Start: 2021-08-04 — End: 2021-08-02

## 2021-07-28 MED ORDER — APIXABAN 5 MG PO TABS
10.0000 mg | ORAL_TABLET | Freq: Two times a day (BID) | ORAL | Status: DC
  Administered 2021-07-28 – 2021-08-02 (×11): 10 mg via ORAL
  Filled 2021-07-28 (×11): qty 2

## 2021-07-28 NOTE — Consult Note (Signed)
ANTICOAGULATION CONSULT NOTE  ? ?Pharmacy Consult for heparin ?Indication: DVT ? ?Allergies  ?Allergen Reactions  ? Codeine Nausea Only  ? Disopyramide   ?  Other reaction(s): Unknown  ? Ibuprofen Diarrhea  ? Iodine   ?  blisters  ? Metformin And Related Other (See Comments)  ?  unknown  ? Norpace [Disopyramide Phosphate]   ? Quinidine   ?  Other reaction(s): Unknown  ? Terfenadine   ?  Other reaction(s): Unknown  ? Topiramate   ?  Other reaction(s): Other (See Comments) ?Hair loss  ? Verapamil   ?  Other reaction(s): Unknown  ? Dexamethasone Sodium Phosphate Palpitations  ? ? ?Patient Measurements: ?Height: '5\' 4"'$  (162.6 cm) ?Weight: 50 kg (110 lb 3.7 oz) ?IBW/kg (Calculated) : 54.7 ?Heparin Dosing Weight: 50 kg ? ?Vital Signs: ?Temp: 98.1 ?F (36.7 ?C) (04/24 0444) ?Temp Source: Oral (04/24 0444) ?BP: 142/90 (04/24 0444) ?Pulse Rate: 65 (04/24 0444) ? ?Labs: ?Recent Labs  ?  07/25/21 ?1934 07/25/21 ?1934 07/26/21 ?0533 07/26/21 ?1325 07/27/21 ?0431 07/28/21 ?0527  ?HGB 12.3  --  10.6*  --  10.4* 11.1*  ?HCT 39.9  --  33.8*  --  33.6* 34.5*  ?PLT 245  --  232  --  213 210  ?HEPARINUNFRC  --    < > 0.48 0.48 0.56 0.41  ?CREATININE 1.03*  --  0.82  --  0.91  --   ? < > = values in this interval not displayed.  ? ? ? ?Estimated Creatinine Clearance: 29.2 mL/min (by C-G formula based on SCr of 0.91 mg/dL). ? ? ?Medical History: ?Past Medical History:  ?Diagnosis Date  ? A-fib ([place])   ? Anemia   ? Arthritis   ? Breast cancer ([place]) 1978  ? left breast with lymph node removal  ? CHF (congestive heart failure) ([place])   ? Diabetes mellitus without complication ([place])   ? Diverticulitis   ? Dysrhythmia   ? GERD (gastroesophageal reflux disease)   ? Hyperlipidemia   ? Macular degeneration of both eyes   ? Mitral valve prolapse   ? Mitral valve regurgitation   ? Presence of permanent cardiac pacemaker   ? ? ?Medications:  ?PTA:  ?Inpatient: Heparin drip  ?Allergies: No AC/APT related allergies ? ?Assessment: ?88 y.o. female with  past medical history of hypertension, diabetes, CHF, atrial fibrillation, sick sinus syndrome status post pacemaker, and stroke who presents to the ED complaining of abdominal pain. Pharmacy consulted for heparin drip in the setting of DVT. ? ?4/21: CT Angio of Right upper extremity: Concerning for possible embolus."  ? ?Date Time aPTT/HL Rate/Comment ?4/21 2200   3500u x1, 850u/hr ?4/22 0533 -- / 0.48 Therapeutic x 1   ?4/22 1325 -- / 0.48 Therapeutic  ?4/23 0431 -- / 0.56 Therapeutic ?4/24 0527 -- / 0.41 ? ?Goal of Therapy:  ?Heparin level 0.3-0.7 units/ml ?Monitor platelets by anticoagulation protocol: Yes ?  ?Plan:  ?Heparin level is therapeutic. Continue heparin infusion at 850 units/hr. Recheck heparin level and CBC with AM labs.  ? ?Thank you for allowing pharmacy to be a part of this pleasant patient?s care. ? ?Renda Rolls, PharmD, MBA ?07/28/2021 ?6:11 AM ? ? ? ? ? ?

## 2021-07-28 NOTE — Evaluation (Signed)
Occupational Therapy Evaluation ?Patient Details ?Name: Elizabeth Novak ?MRN: [id number] ?DOB: 1923-10-03 ?Today's Date: 07/28/2021 ? ? ?History of Present Illness Pt is a 88 y/o F admitted on 07/25/21 after presenting with c/o sudden onset of RUE pain. Emergent CT angiogram of the right upper extremity showed abrupt cut off of the distal radial artery with some reconstitution of the forearm vessels. Pt was seen by vascular surgery in the ER, started on heparin infusion overnight; pt is currently being managed conservatively as she is not a good surgical candidate. PMH: SSS s/p pacemaker, HTN, HLD, insulin dependent DM2, depression, dementia, GERD  ? ?Clinical Impression ?  ?Pt was seen for OT evaluation this date. Per chart review, pt lives at ALF and was using a RW and able to walk to/from the dining hall. No family present and unable to verify PLOF. Pt received standing at sink brushing teeth finished with PT session. Pt set up for sponge bath seated EOB, CGA for standing LB bathing, MIN A for washing her back. Pt endorsing fatigue after bath and dressing tasks performed. Pt endorsing L>R hand numbness and reports concern because she hadn't told the MD. (RN clarified after session that he is aware.) Pt demonstrates impairments as described below (See OT problem list) which functionally limit her ability to perform ADL/self-care tasks. Pt would benefit from skilled OT services to address noted impairments and functional limitations (see below for any additional details) in order to maximize safety and independence while minimizing falls risk and caregiver burden. Upon hospital discharge, recommend HHOT to maximize pt safety and return to functional independence during meaningful occupations of daily life.    ? ?Recommendations for follow up therapy are one component of a multi-disciplinary discharge planning process, led by the attending physician.  Recommendations may be updated based on patient status, additional  functional criteria and insurance authorization.  ? ?Follow Up Recommendations ? Home health OT  ?  ?Assistance Recommended at Discharge Frequent or constant Supervision/Assistance  ?Patient can return home with the following A little help with walking and/or transfers;A little help with bathing/dressing/bathroom;Assistance with cooking/housework;Assist for transportation;Direct supervision/assist for medications management;Help with stairs or ramp for entrance ? ?  ?Functional Status Assessment ? Patient has had a recent decline in their functional status and demonstrates the ability to make significant improvements in function in a reasonable and predictable amount of time.  ?Equipment Recommendations ? None recommended by OT  ?  ?Recommendations for Other Services   ? ? ?  ?Precautions / Restrictions Precautions ?Precautions: Fall ?Restrictions ?Weight Bearing Restrictions: No  ? ?  ? ?Mobility Bed Mobility ?Overal bed mobility: Modified Independent ?  ?  ?  ?  ?  ?  ?  ?  ? ?Transfers ?Overall transfer level: Needs assistance ?Equipment used: Rolling walker (2 wheels), 1 person hand held assist ?Transfers: Sit to/from Stand ?Sit to Stand: Supervision, Min guard ?  ?  ?  ?  ?  ?General transfer comment: with repeated STS attempts during bathing and LB dressing, pt required SBA-CGA ?  ? ?  ?Balance Overall balance assessment: Needs assistance ?Sitting-balance support: Feet supported ?Sitting balance-Leahy Scale: Good ?  ?  ?Standing balance support: Single extremity supported, No upper extremity supported, During functional activity ?Standing balance-Leahy Scale: Fair ?  ?  ?  ?  ?  ?  ?  ?  ?  ?  ?  ?  ?   ? ?ADL either performed or assessed with clinical judgement  ? ?ADL   ?  ?  ?  ?  ?  ?  ?  ?  ?  ?  ?  ?  ?  ?  ?  ?  ?  ?  ?  ?  General ADL Comments: Pt required CGA-MIN A for LB ADL tasks involving transfers, and MIN A for bathing her back while she was able to complete other UB bathing from seated position  with set up for supplies. She completed some LB bathing in standing with CGA, and otherwise sitting with supervision.  ? ? ? ?Vision   ?   ?   ?Perception   ?  ?Praxis   ?  ? ?Pertinent Vitals/Pain Pain Assessment ?Pain Assessment: 0-10 ?Pain Score: 6  ?Pain Location: RUE ?Pain Descriptors / Indicators: Aching ?Pain Intervention(s): Limited activity within patient's tolerance, Monitored during session, Repositioned  ? ? ? ?Hand Dominance Right ?  ?Extremity/Trunk Assessment Upper Extremity Assessment ?Upper Extremity Assessment: Generalized weakness (pt endorses numbness in both hands, L>R, and RUE pain) ?  ?Lower Extremity Assessment ?Lower Extremity Assessment: Generalized weakness ?  ?  ?  ?Communication Communication ?Communication: HOH ?  ?Cognition Arousal/Alertness: Awake/alert ?Behavior During Therapy: [facility] for tasks assessed/performed ?Overall Cognitive Status: History of cognitive impairments - at baseline ?  ?  ?  ?  ?  ?  ?  ?  ?  ?  ?  ?  ?  ?  ?  ?  ?General Comments: appropriate throughout, follows commands well for tasks ?  ?  ?General Comments    ? ?  ?Exercises   ?  ?Shoulder Instructions    ? ? ?Home Living Family/patient expects to be discharged to:: Assisted living ?  ?  ?  ?  ?  ?  ?  ?  ?  ?  ?  ?  ?  ?  ?Home Equipment: Agricultural consultant (2 wheels) ?  ?  ?  ? ?  ?Prior Functioning/Environment Prior Level of Function : Patient poor historian/Family not available ?  ?  ?  ?  ?  ?  ?Mobility Comments: Pt reports she ambulates to dining room with RW without assistance. ?ADLs Comments: no family present, per previous therapy note during last admission ~3wks ago, staff assist with ADL ?  ? ?  ?  ?OT Problem List: Decreased strength;Pain;Decreased cognition;Decreased safety awareness;Impaired balance (sitting and/or standing);Decreased knowledge of use of DME or AE;Decreased activity tolerance ?  ?   ?OT Treatment/Interventions: Self-care/ADL training;Therapeutic exercise;Therapeutic activities;DME  and/or AE instruction;Patient/family education;Cognitive remediation/compensation;Balance training  ?  ?OT Goals(Current goals can be found in the care plan section) Acute Rehab OT Goals ?Patient Stated Goal: get better and go home ?OT Goal Formulation: With patient ?Time For Goal Achievement: 08/11/21 ?Potential to Achieve Goals: Good ?ADL Goals ?Pt Will Perform Lower Body Dressing: with modified independence;sit to/from stand ?Pt Will Transfer to Toilet: with supervision;ambulating (LRAD) ?Pt Will Perform Toileting - Clothing Manipulation and hygiene: with modified independence ?Additional ADL Goal #1: Pt will complete all aspects of bathing, primarily from seated position, with supervision for safety.  ?OT Frequency: Min 2X/week ?  ? ?Co-evaluation   ?  ?  ?  ?  ? ?  ?AM-PAC OT "6 Clicks" Daily Activity     ?Outcome Measure Help from another person eating meals?: None ?Help from another person taking care of personal grooming?: None ?Help from another person toileting, which includes using toliet, bedpan, or urinal?: A Little ?Help from another person bathing (including washing, rinsing, drying)?: A Little ?Help from another person to put on and taking off regular upper body clothing?: A Little ?Help from another person to put on and taking off regular lower body clothing?: A Little ?6 Click Score:  20 ?  ?End of Session Equipment Utilized During Treatment: Gait belt;Rolling walker (2 wheels) ?Nurse Communication: Other (comment) (BUE numbness) ? ?Activity Tolerance: Patient tolerated treatment well ?Patient left: in bed;with call bell/phone within reach;with bed alarm set ? ?OT Visit Diagnosis: Other abnormalities of gait and mobility (R26.89);Muscle weakness (generalized) (M62.81);Pain ?Pain - Right/Left: Right ?Pain - part of body: Arm  ?              ?Time: 1610-9604 ?OT Time Calculation (min): 29 min ?Charges:  OT General Charges ?$OT Visit: 1 Visit ?OT Evaluation ?$OT Eval Moderate Complexity: 1 Mod ?OT  Treatments ?$Self Care/Home Management : 23-37 mins ? ?Arman Filter., MPH, MS, OTR/L ?ascom [phone] ?07/28/21, 10:13 AM ?

## 2021-07-28 NOTE — Progress Notes (Signed)
Physical Therapy Treatment ?Patient Details ?Name: Elizabeth Novak ?MRN: [id number] ?DOB: [birth year] ?Today's Date: 07/28/2021 ? ? ?History of Present Illness Pt is a 88 y/o F admitted on 07/25/21 after presenting with c/o sudden onset of RUE pain. Emergent CT angiogram of the right upper extremity showed abrupt cut off of the distal radial artery with some reconstitution of the forearm vessels. Pt was seen by vascular surgery in the ER, started on heparin infusion overnight; pt is currently being managed conservatively as she is not a good surgical candidate. PMH: SSS s/p pacemaker, HTN, HLD, insulin dependent DM2, depression, dementia, GERD ? ?  ?PT Comments  ? ? Patient alert, agreeable to mobility and requesting a bath, PT spoke with CNA and CNA aware. PT also assisted with patient to call for lunch order prior to mobility at pt request. Bed mobility performed modI and pt able to sit <> stand with RW modI as well. She ambulated ~69ft in room RW and supervision, declined further mobility in hallway at this time despite encouragement. PT preparing to assist pt with brushing her teeth upon OT entrance, left with OT standing at bedside. The patient would benefit from further skilled PT intervention to continue to progress towards goals. Recommendation remains appropriate.  ?  ?Recommendations for follow up therapy are one component of a multi-disciplinary discharge planning process, led by the attending physician.  Recommendations may be updated based on patient status, additional functional criteria and insurance authorization. ? ?Follow Up Recommendations ? Home health PT ?  ?  ?Assistance Recommended at Discharge Frequent or constant Supervision/Assistance  ?Patient can return home with the following A little help with bathing/dressing/bathroom;Assistance with cooking/housework;Assist for transportation;Direct supervision/assist for medications management;Help with stairs or ramp for entrance ?  ?Equipment  Recommendations ? None recommended by PT  ?  ?Recommendations for Other Services   ? ? ?  ?Precautions / Restrictions Precautions ?Precautions: Fall ?Restrictions ?Weight Bearing Restrictions: No  ?  ? ?Mobility ? Bed Mobility ?Overal bed mobility: Modified Independent ?  ?  ?  ?  ?  ?  ?General bed mobility comments: supine>sit with HOB moderately elevated & bed rails\ ?  ? ?Transfers ?Overall transfer level: Modified independent ?Equipment used: Rolling walker (2 wheels) ?Transfers: Sit to/from Stand ?Sit to Stand: Modified independent (Device/Increase time) ?  ?  ?  ?  ?  ?General transfer comment: assist from PT to don slippers at patient request ?  ? ?Ambulation/Gait ?Ambulation/Gait assistance: Supervision ?Gait Distance (Feet): 17 Feet ?Assistive device: Rolling walker (2 wheels) ?Gait Pattern/deviations: Decreased step length - right, Decreased stride length, Decreased step length - left, Trunk flexed ?Gait velocity: decreased ?  ?  ?  ? ? ?Stairs ?  ?  ?  ?  ?  ? ? ?Wheelchair Mobility ?  ? ?Modified Rankin (Stroke Patients Only) ?  ? ? ?  ?Balance Overall balance assessment: Needs assistance ?Sitting-balance support: Feet supported ?Sitting balance-Leahy Scale: Good ?  ?  ?Standing balance support: Bilateral upper extremity supported, During functional activity, Single extremity supported ?Standing balance-Leahy Scale: Good ?  ?  ?  ?  ?  ?  ?  ?  ?  ?  ?  ?  ?  ? ?  ?Cognition Arousal/Alertness: Awake/alert ?Behavior During Therapy: [facility] for tasks assessed/performed ?Overall Cognitive Status: Within Functional Limits for tasks assessed ?  ?  ?  ?  ?  ?  ?  ?  ?  ?  ?  ?  ?  ?  ?  ?  ?  ?  ?  ? ?  ?  Exercises   ? ?  ?General Comments   ?  ?  ? ?Pertinent Vitals/Pain Pain Assessment ?Pain Assessment: No/denies pain  ? ? ?Home Living   ?  ?  ?  ?  ?  ?  ?  ?  ?  ?   ?  ?Prior Function    ?  ?  ?   ? ?PT Goals (current goals can now be found in the care plan section) Progress towards PT goals: Progressing  toward goals ? ?  ?Frequency ? ? ? Min 2X/week ? ? ? ?  ?PT Plan Current plan remains appropriate  ? ? ?Co-evaluation   ?  ?  ?  ?  ? ?  ?AM-PAC PT "6 Clicks" Mobility   ?Outcome Measure ? Help needed turning from your back to your side while in a flat bed without using bedrails?: None ?Help needed moving from lying on your back to sitting on the side of a flat bed without using bedrails?: None ?Help needed moving to and from a bed to a chair (including a wheelchair)?: None ?Help needed standing up from a chair using your arms (e.g., wheelchair or bedside chair)?: None ?Help needed to walk in hospital room?: None ?Help needed climbing 3-5 steps with a railing? : A Little ?6 Click Score: 23 ? ?  ?End of Session Equipment Utilized During Treatment: Gait belt ?Activity Tolerance: Patient tolerated treatment well ?Patient left: Other (comment) (standing with OT at the sink to brush her teeth) ?  ?PT Visit Diagnosis: Muscle weakness (generalized) (M62.81) ?  ? ? ?Time: [phone] ?PT Time Calculation (min) (ACUTE ONLY): 12 min ? ?Charges:  $Therapeutic Activity: 8-22 mins          ?          ?Olga Coaster PT, DPT ?10:03 AM,07/28/21 ? ?

## 2021-07-28 NOTE — Progress Notes (Addendum)
?PROGRESS NOTE ? ? ? ?Elizabeth Novak  ZOX:[id number] DOB: February 02, 1924 DOA: 07/25/2021 ?PCP: Diona Browner [facility]  ? ?Brief Narrative:  ?88 year old female with history of sick sinus syndrome status post pacemaker, hypertension, hyperlipidemia, insulin-dependent type 2 diabetes, depression, dementia, GERD brought from assisted living facility with sudden onset of right upper extremity pain.  In the emergency room she is hemodynamically stable on room air.  Emergent CT angiogram of the right upper extremity showed abrupt cut off of the distal radial artery with some reconstitution of the forearm vessels as above.  There was concern about possible embolus.  Decreased perfusion of the lower pole of the right kidney. ?Patient does have a history of paroxysmal A-fib and she was taken off anticoagulation due to fall risk and frailty.  Seen by vascular surgery in the emergency room, started on heparin infusion overnight.  Currently this will be managed conservatively as she is not a good surgical candidate. ?  ? ? ?Assessment & Plan: ? Principal Problem: ?  Acute occlusion of artery of upper extremity due to thrombus (HCC) ?Active Problems: ?  Atrial fibrillation (HCC) ?  Sick sinus syndrome (HCC) ?  GERD without esophagitis ?  Chronic constipation ?  Dyslipidemia associated with type 2 diabetes mellitus (HCC) ?  Anxiety with depression ?  Congestive heart failure due to valvular disease (HCC) ?  S/P placement of cardiac pacemaker ?  HTN (hypertension) ?  HLD (hyperlipidemia) ?  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  ? ?* Acute occlusion of artery of upper extremity due to thrombus [facility]) ?-Seen by vascular surgeon, not a good surgical candidate.  We will transition her off heparin drip to Eliquis. ? ?Left upper extremity numbness and discomfort ?- Slightly fluctuating.  Unable to get MRI due to her pacemaker, therefore will get CT C spine for now.  ? ?Bilateral lower extremity  swelling ?- Secondary to her severe cardiac/valvular issues.  Advised nursing staff to place compression stocking.  Elevate legs. ? ?Renal infarct, right side ?- Seen on CT, continue anticoagulation ? ?Abdominal distention and bloating secondary to constipation ?- KUB shows constipation.  Bowel regimen, increase mobility. ? ?Congestive heart failure with grade 3 DD.  Restrictive cardiomyopathy ?Severe MR and TR ?-Currently on Imdur and torsemide ? ?Constipation ?- Bowel regimen ? ?HTN (hypertension) ?-Imdur 30 mg daily, torsemide daily ? ?Anxiety with depression ?-Mirtazapine 15 mg at bedtime ? ?Chronic constipation ?-Bowel regimen ? ?GERD without esophagitis ?-Currently on Protonix and Pepcid ? ?Sick sinus syndrome (HCC) ?- Status post cardiac device implementation ?-Continue digoxin 0.125 mg daily ? ?Paroxysmal atrial fibrillation (HCC) ?-Currently anticoagulated ?  ? ? ? ?DVT prophylaxis: Place TED hose Start: 07/25/21 2215 ?apixaban (ELIQUIS) tablet 10 mg  ?apixaban (ELIQUIS) tablet 5 mg Code Status: DNR ?Family Communication:   ? ?Status is: Inpatient ?Remains inpatient appropriate because: Patient is getting MRI cervical spine for left upper extremity numbness.  Transitioning her off heparin drip to Eliquis.  Still having significant lower extremity swelling. ? ? ?Subjective: ?States she has numbness of the left upper extremity distal to her elbow side.  Denies any weakness. ?Examination: ? ?General exam: Elderly frail, not in acute distress ?Respiratory system: Clear to auscultation. Respiratory effort normal. ?Cardiovascular system: S1 & S2 heard, RRR. No JVD, murmurs, rubs, gallops or clicks. No pedal edema. ?Gastrointestinal system: Abdomen is nondistended, soft and nontender. No organomegaly or masses felt. Normal bowel sounds heard. ?Central nervous system: Alert awake oriented X4.  Slightly diminished sensation  in left upper extremity but overall intact ?Extremities: Strength in bilateral upper  extremity 4/5. ?Skin: No rashes, lesions or ulcers ?Psychiatry: Judgement and insight appear normal. Mood & affect appropriate.  ? ? ? ?Objective: ?Vitals:  ? 07/27/21 2050 07/27/21 2332 07/28/21 0444 07/28/21 0745  ?BP: (!) 137/39 134/87 (!) 142/90 (!) 147/69  ?Pulse: 94 72 65 71  ?Resp: 17 18 19 16   ?Temp: 98.1 ?F (36.7 ?C) 98.3 ?F (36.8 ?C) 98.1 ?F (36.7 ?C) 98.1 ?F (36.7 ?C)  ?TempSrc:  Oral Oral   ?SpO2: 97% 99% 98% 98%  ?Weight:      ?Height:      ? ? ?Intake/Output Summary (Last 24 hours) at 07/28/2021 1118 ?Last data filed at 07/28/2021 0755 ?Gross per 24 hour  ?Intake 841.58 ml  ?Output 100 ml  ?Net 741.58 ml  ? ?Filed Weights  ? 07/25/21 1855  ?Weight: 50 kg  ? ? ? ?Data Reviewed:  ? ?CBC: ?Recent Labs  ?Lab 07/25/21 ?1934 07/26/21 ?4098 07/27/21 ?0431 07/28/21 ?1191  ?WBC 4.9 5.0 4.5 4.1  ?NEUTROABS 3.3  --  2.4  --   ?HGB 12.3 10.6* 10.4* 11.1*  ?HCT 39.9 33.8* 33.6* 34.5*  ?MCV 89.1 85.1 85.9 85.6  ?PLT 245 232 213 210  ? ?Basic Metabolic Panel: ?Recent Labs  ?Lab 07/25/21 ?1934 07/26/21 ?4782 07/27/21 ?0431 07/28/21 ?9562  ?NA 141 142 141 141  ?K 4.1 3.2* 3.9 4.4  ?CL 98 103 102 104  ?CO2 32 33* 33* 30  ?GLUCOSE 246* 86 165* 205*  ?BUN 30* 25* 24* 20  ?CREATININE 1.03* 0.82 0.91 0.83  ?CALCIUM 9.3 8.9 8.9 9.2  ?MG  --   --  2.1 2.2  ?PHOS  --   --  2.7  --   ? ?GFR: ?Estimated Creatinine Clearance: 32 mL/min (by C-G formula based on SCr of 0.83 mg/dL). ?Liver Function Tests: ?Recent Labs  ?Lab 07/27/21 ?0431  ?AST 27  ?ALT 23  ?ALKPHOS 58  ?BILITOT 0.7  ?PROT 5.5*  ?ALBUMIN 2.8*  ? ?No results for input(s): LIPASE, AMYLASE in the last 168 hours. ?No results for input(s): AMMONIA in the last 168 hours. ?Coagulation Profile: ?No results for input(s): INR, PROTIME in the last 168 hours. ?Cardiac Enzymes: ?No results for input(s): CKTOTAL, CKMB, CKMBINDEX, TROPONINI in the last 168 hours. ?BNP (last 3 results) ?No results for input(s): PROBNP in the last 8760 hours. ?HbA1C: ?No results for input(s): HGBA1C  in the last 72 hours. ?CBG: ?Recent Labs  ?Lab 07/27/21 ?1617 07/27/21 ?1924 07/27/21 ?2355 07/28/21 ?1308 07/28/21 ?0815  ?GLUCAP 181* 202* 255* 196* 180*  ? ?Lipid Profile: ?No results for input(s): CHOL, HDL, LDLCALC, TRIG, CHOLHDL, LDLDIRECT in the last 72 hours. ?Thyroid Function Tests: ?No results for input(s): TSH, T4TOTAL, FREET4, T3FREE, THYROIDAB in the last 72 hours. ?Anemia Panel: ?No results for input(s): VITAMINB12, FOLATE, FERRITIN, TIBC, IRON, RETICCTPCT in the last 72 hours. ?Sepsis Labs: ?No results for input(s): PROCALCITON, LATICACIDVEN in the last 168 hours. ? ?No results found for this or any previous visit (from the past 240 hour(s)).  ? ? ? ? ? ?Radiology Studies: ?DG Abd 1 View ? ?Result Date: 07/27/2021 ?CLINICAL DATA:  Abdominal distension EXAM: ABDOMEN - 1 VIEW COMPARISON:  None. FINDINGS: Nonobstructive pattern of bowel gas. No obvious free air. Moderate burden of stool in the distal colon. Gallstone in the right upper quadrant. IMPRESSION: 1. Nonobstructive pattern of bowel gas. No obvious free air. Moderate burden of stool in the distal colon. 2.  Cholelithiasis. Electronically Signed   By: Jearld Lesch M.D.   On: 07/27/2021 12:37   ? ? ? ? ? ?Scheduled Meds: ? apixaban  10 mg Oral BID  ? Followed by  ? [START ON 08/04/2021] apixaban  5 mg Oral BID  ? cholecalciferol  2,000 Units Oral Daily  ? digoxin  0.125 mg Oral Daily  ? famotidine  20 mg Oral QHS  ? insulin aspart  0-6 Units Subcutaneous TID WC  ? isosorbide mononitrate  30 mg Oral Daily  ? mirtazapine  15 mg Oral QHS  ? pantoprazole  40 mg Oral Daily  ? polyethylene glycol  17 g Oral BID  ? senna-docusate  2 tablet Oral BID  ? torsemide  20 mg Oral Daily  ? ?Continuous Infusions: ? ? ? ? LOS: 2 days  ? ?Time spent= 35 mins ? ? ? ?Pratham Cassatt Joline Maxcy, MD ?[facility] ? ?If 7PM-7AM, please contact night-coverage ? ?07/28/2021, 11:18 AM  ? ?

## 2021-07-29 ENCOUNTER — Inpatient Hospital Stay: Payer: Medicare Other

## 2021-07-29 DIAGNOSIS — I742 Embolism and thrombosis of arteries of the upper extremities: Secondary | ICD-10-CM | POA: Diagnosis not present

## 2021-07-29 LAB — GLUCOSE, CAPILLARY
Glucose-Capillary: 160 mg/dL — ABNORMAL HIGH (ref 70–99)
Glucose-Capillary: 165 mg/dL — ABNORMAL HIGH (ref 70–99)
Glucose-Capillary: 182 mg/dL — ABNORMAL HIGH (ref 70–99)
Glucose-Capillary: 182 mg/dL — ABNORMAL HIGH (ref 70–99)
Glucose-Capillary: 194 mg/dL — ABNORMAL HIGH (ref 70–99)
Glucose-Capillary: 203 mg/dL — ABNORMAL HIGH (ref 70–99)

## 2021-07-29 LAB — BASIC METABOLIC PANEL
Anion gap: 5 (ref 5–15)
BUN: 19 mg/dL (ref 8–23)
CO2: 29 mmol/L (ref 22–32)
Calcium: 9.2 mg/dL (ref 8.9–10.3)
Chloride: 103 mmol/L (ref 98–111)
Creatinine, Ser: 0.91 mg/dL (ref 0.44–1.00)
GFR, Estimated: 58 mL/min — ABNORMAL LOW (ref >60)
Glucose, Bld: 188 mg/dL — ABNORMAL HIGH (ref 70–99)
Potassium: 3.9 mmol/L (ref 3.5–5.1)
Sodium: 137 mmol/L (ref 135–145)

## 2021-07-29 LAB — MAGNESIUM: Magnesium: 2.1 mg/dL (ref 1.7–2.4)

## 2021-07-29 LAB — CBC
HCT: 33.9 % — ABNORMAL LOW (ref 36.0–46.0)
Hemoglobin: 10.6 g/dL — ABNORMAL LOW (ref 12.0–15.0)
MCH: 26.8 pg (ref 26.0–34.0)
MCHC: 31.3 g/dL (ref 30.0–36.0)
MCV: 85.8 fL (ref 80.0–100.0)
Platelets: 208 10*3/uL (ref 150–400)
RBC: 3.95 MIL/uL (ref 3.87–5.11)
RDW: 14.1 % (ref 11.5–15.5)
WBC: 3.9 10*3/uL — ABNORMAL LOW (ref 4.0–10.5)
nRBC: 0 % (ref 0.0–0.2)

## 2021-07-29 LAB — BRAIN NATRIURETIC PEPTIDE: B Natriuretic Peptide: 202.9 pg/mL — ABNORMAL HIGH (ref 0.0–100.0)

## 2021-07-29 MED ORDER — BISACODYL 10 MG RE SUPP
10.0000 mg | Freq: Once | RECTAL | Status: AC
  Administered 2021-07-29: 10 mg via RECTAL
  Filled 2021-07-29: qty 1

## 2021-07-29 NOTE — Progress Notes (Signed)
?PROGRESS NOTE ? ? ? ?Elizabeth Novak  GMW:[id number] DOB: 28-Jan-1924 DOA: 07/25/2021 ?PCP: [facility]  ? ?Brief Narrative:  ?88 year old female with history of sick sinus syndrome status post pacemaker, hypertension, hyperlipidemia, insulin-dependent type 2 diabetes, depression, dementia, GERD brought from assisted living facility with sudden onset of right upper extremity pain.  In the emergency room she is hemodynamically stable on room air.  Emergent CT angiogram of the right upper extremity showed abrupt cut off of the distal radial artery with some reconstitution of the forearm vessels as above.  There was concern about possible embolus.  Decreased perfusion of the lower pole of the right kidney. ?Patient does have a history of paroxysmal A-fib and she was taken off anticoagulation due to fall risk and frailty.  Seen by vascular surgery in the emergency room, started on heparin infusion overnight.  Currently this will be managed conservatively as she is not a good surgical candidate. ?  ? ? ?Assessment & Plan: ? Principal Problem: ?  Acute occlusion of artery of upper extremity due to thrombus (HCC) ?Active Problems: ?  Atrial fibrillation (HCC) ?  Sick sinus syndrome (HCC) ?  GERD without esophagitis ?  Chronic constipation ?  Dyslipidemia associated with type 2 diabetes mellitus (HCC) ?  Anxiety with depression ?  Congestive heart failure due to valvular disease (HCC) ?  S/P placement of cardiac pacemaker ?  HTN (hypertension) ?  HLD (hyperlipidemia) ?  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  ? ?* Acute occlusion of artery of upper extremity due to thrombus [facility]) ?-Seen by vascular surgeon, not a good surgical candidate.  We will transition her off heparin drip to Eliquis. ? ?Severe constipation ?- Fecal impaction.  Aggressive bowel regimen.  Tapwater enema.  Advised to mobilize as much as possible. ? ?Left upper extremity numbness and discomfort ?- Patient  tells me this has been chronic for several weeks.  Unable to obtain MRI here due to her pacemaker and she is allergic to contrast for CT scan.  This can be done outpatient at [facility] or any place that can take care of her pacemaker ? ?Bilateral lower extremity swelling ?- Secondary to her severe cardiac/valvular issues.  Advised nursing staff to place compression stocking.  Elevate legs. ? ?Renal infarct, right side ?- Seen on CT, continue anticoagulation ? ?Congestive heart failure with grade 3 DD.  Restrictive cardiomyopathy ?Severe MR and TR ?-Currently on Imdur and torsemide ? ?Constipation ?- Bowel regimen ? ?HTN (hypertension) ?-Imdur 30 mg daily, torsemide daily ? ?Anxiety with depression ?-Mirtazapine 15 mg at bedtime ? ?Chronic constipation ?-Bowel regimen ? ?GERD without esophagitis ?-Currently on Protonix and Pepcid ? ?Sick sinus syndrome (HCC) ?- Status post cardiac device implementation ?-Continue digoxin 0.125 mg daily ? ?Paroxysmal atrial fibrillation (HCC) ?-Currently anticoagulated ?  ? ? ? ?DVT prophylaxis: Place and maintain sequential compression device Start: 07/28/21 1120 ?Place TED hose Start: 07/25/21 2215 ?apixaban (ELIQUIS) tablet 10 mg  ?apixaban (ELIQUIS) tablet 5 mg  ?Code Status: DNR ?Family Communication:   ? ?Status is: Inpatient ?Remains inpatient appropriate because: Patient feels quite constipated and bloated.  Does not feel comfortable leaving until she has a good bowel movement. ? ?Subjective: ?Still has some left upper extremity numbness but she feels very bloated with some rectal area discomfort. ?Examination: ? ?Constitutional: Elderly frail ?Respiratory: Clear to auscultation bilaterally ?Cardiovascular: Normal sinus rhythm, no rubs ?Abdomen: Abdominal distention, diminished bowel sounds. ?Musculoskeletal: No edema noted ?Skin: No rashes  seen ?Neurologic: CN 2-12 grossly intact.  And nonfocal ?Psychiatric: Normal judgment and insight. Alert and oriented x 3.  Normal mood. ? ? ?Objective: ?Vitals:  ? 07/28/21 2332 07/29/21 0314 07/29/21 0746 07/29/21 1126  ?BP: 104/77 (!) 159/78 (!) 159/82 140/60  ?Pulse: 80 95 97 73  ?Resp: 17 17 18 17   ?Temp: 98.3 ?F (36.8 ?C) 97.9 ?F (36.6 ?C) 97.8 ?F (36.6 ?C) 97.7 ?F (36.5 ?C)  ?TempSrc: Oral Oral Oral Oral  ?SpO2: 98% 98% 97% 96%  ?Weight:      ?Height:      ? ? ?Intake/Output Summary (Last 24 hours) at 07/29/2021 1135 ?Last data filed at 07/29/2021 1022 ?Gross per 24 hour  ?Intake 720 ml  ?Output 750 ml  ?Net -30 ml  ? ?Filed Weights  ? 07/25/21 1855  ?Weight: 50 kg  ? ? ? ?Data Reviewed:  ? ?CBC: ?Recent Labs  ?Lab 07/25/21 ?1934 07/26/21 ?7829 07/27/21 ?0431 07/28/21 ?5621 07/29/21 ?0421  ?WBC 4.9 5.0 4.5 4.1 3.9*  ?NEUTROABS 3.3  --  2.4  --   --   ?HGB 12.3 10.6* 10.4* 11.1* 10.6*  ?HCT 39.9 33.8* 33.6* 34.5* 33.9*  ?MCV 89.1 85.1 85.9 85.6 85.8  ?PLT 245 232 213 210 208  ? ?Basic Metabolic Panel: ?Recent Labs  ?Lab 07/25/21 ?1934 07/26/21 ?3086 07/27/21 ?0431 07/28/21 ?5784 07/29/21 ?0421  ?NA 141 142 141 141 137  ?K 4.1 3.2* 3.9 4.4 3.9  ?CL 98 103 102 104 103  ?CO2 32 33* 33* 30 29  ?GLUCOSE 246* 86 165* 205* 188*  ?BUN 30* 25* 24* 20 19  ?CREATININE 1.03* 0.82 0.91 0.83 0.91  ?CALCIUM 9.3 8.9 8.9 9.2 9.2  ?MG  --   --  2.1 2.2 2.1  ?PHOS  --   --  2.7  --   --   ? ?GFR: ?Estimated Creatinine Clearance: 29.2 mL/min (by C-G formula based on SCr of 0.91 mg/dL). ?Liver Function Tests: ?Recent Labs  ?Lab 07/27/21 ?0431  ?AST 27  ?ALT 23  ?ALKPHOS 58  ?BILITOT 0.7  ?PROT 5.5*  ?ALBUMIN 2.8*  ? ?No results for input(s): LIPASE, AMYLASE in the last 168 hours. ?No results for input(s): AMMONIA in the last 168 hours. ?Coagulation Profile: ?No results for input(s): INR, PROTIME in the last 168 hours. ?Cardiac Enzymes: ?No results for input(s): CKTOTAL, CKMB, CKMBINDEX, TROPONINI in the last 168 hours. ?BNP (last 3 results) ?No results for input(s): PROBNP in the last 8760 hours. ?HbA1C: ?No results for input(s): HGBA1C in the last 72  hours. ?CBG: ?Recent Labs  ?Lab 07/28/21 ?2034 07/29/21 ?6962 07/29/21 ?0310 07/29/21 ?9528 07/29/21 ?1125  ?GLUCAP 224* 203* 182* 160* 165*  ? ?Lipid Profile: ?No results for input(s): CHOL, HDL, LDLCALC, TRIG, CHOLHDL, LDLDIRECT in the last 72 hours. ?Thyroid Function Tests: ?No results for input(s): TSH, T4TOTAL, FREET4, T3FREE, THYROIDAB in the last 72 hours. ?Anemia Panel: ?No results for input(s): VITAMINB12, FOLATE, FERRITIN, TIBC, IRON, RETICCTPCT in the last 72 hours. ?Sepsis Labs: ?No results for input(s): PROCALCITON, LATICACIDVEN in the last 168 hours. ? ?No results found for this or any previous visit (from the past 240 hour(s)).  ? ? ? ? ? ?Radiology Studies: ?DG Abd 1 View ? ?Result Date: 07/27/2021 ?CLINICAL DATA:  Abdominal distension EXAM: ABDOMEN - 1 VIEW COMPARISON:  None. FINDINGS: Nonobstructive pattern of bowel gas. No obvious free air. Moderate burden of stool in the distal colon. Gallstone in the right upper quadrant. IMPRESSION: 1. Nonobstructive pattern of bowel gas. No  obvious free air. Moderate burden of stool in the distal colon. 2.  Cholelithiasis. Electronically Signed   By: Jearld Lesch M.D.   On: 07/27/2021 12:37  ? ?DG ABD ACUTE 2+V W 1V CHEST ? ?Result Date: 07/29/2021 ?CLINICAL DATA:  Abdominal pain EXAM: DG ABDOMEN ACUTE WITH 1 VIEW CHEST COMPARISON:  Previous studies including CT abdomen done on 07/01/2021 and chest radiograph done on 07/01/2021 FINDINGS: Bowel gas pattern is nonspecific. There is no pneumoperitoneum. Moderate to large amount of stool is seen in the colon. There is fecal impaction in the rectum. No abnormal masses or calcifications are seen. Kidneys are mostly obscured by bowel contents. Severe degenerative changes are noted in the lumbar spine. Soft tissue calcifications are noted medial to both proximal femurs. Arterial calcifications are seen. Transverse diameter of heart is increased. There is blunting of both lateral CP angles, more so on the left side.  There are no signs of alveolar pulmonary edema or focal pulmonary consolidation. There is no pneumothorax. Pacemaker battery is seen in the left infraclavicular region with tip of lead in right ventricle. Coronary

## 2021-07-29 NOTE — Progress Notes (Signed)
Patient had 2 more smaller BM post suppository.  Patient reports relief from distention at this time.  Will continue to monitor.   ? ?MD is aware ?

## 2021-07-29 NOTE — Progress Notes (Signed)
Gave patient tap water enema.  Patient tolerated well.  Had a large BM post enema.  Will continue to monitor  ?

## 2021-07-29 NOTE — Progress Notes (Signed)
Physical Therapy Treatment ?Patient Details ?Name: Elizabeth Novak ?MRN: [id number] ?DOB: 1924-03-27 ?Today's Date: 07/29/2021 ? ? ?History of Present Illness Pt is a 88 y/o F admitted on 07/25/21 after presenting with c/o sudden onset of RUE pain. Emergent CT angiogram of the right upper extremity showed abrupt cut off of the distal radial artery with some reconstitution of the forearm vessels. Pt was seen by vascular surgery in the ER, started on heparin infusion overnight; pt is currently being managed conservatively as she is not a good surgical candidate. PMH: SSS s/p pacemaker, HTN, HLD, insulin dependent DM2, depression, dementia, GERD ? ?  ?PT Comments  ? ? Pt initially reluctant to ambulate secondary to having a pending enema but with minor encouragement from this PT and the nurse in the room pt agreeable to ambulate.  Pt was ultimately able to ambulate 3 x 60 feet with very slow cadence and occasional shuffles but was generally steady without LOB.  Pt tended to ambulate too far behind the RW but improved with cuing with fair carryover. Pt will benefit from HHPT upon discharge to safely address deficits listed in patient problem list for decreased caregiver assistance and eventual return to PLOF. ?  ?Recommendations for follow up therapy are one component of a multi-disciplinary discharge planning process, led by the attending physician.  Recommendations may be updated based on patient status, additional functional criteria and insurance authorization. ? ?Follow Up Recommendations ? Home health PT ?  ?  ?Assistance Recommended at Discharge Frequent or constant Supervision/Assistance  ?Patient can return home with the following A little help with bathing/dressing/bathroom;Assistance with cooking/housework;Assist for transportation;Direct supervision/assist for medications management;Help with stairs or ramp for entrance;A little help with walking and/or transfers ?  ?Equipment Recommendations ? None  recommended by PT  ?  ?Recommendations for Other Services   ? ? ?  ?Precautions / Restrictions Precautions ?Precautions: Fall ?Restrictions ?Weight Bearing Restrictions: No  ?  ? ?Mobility ? Bed Mobility ?Overal bed mobility: Modified Independent ?  ?  ?  ?  ?  ?  ?General bed mobility comments: Min extra time and effort only ?  ? ?Transfers ?Overall transfer level: Needs assistance ?Equipment used: Rolling walker (2 wheels) ?Transfers: Sit to/from Stand ?Sit to Stand: Supervision ?  ?  ?  ?  ?  ?General transfer comment: Min verbal cues for hand placement with good eccentric control and stability ?  ? ?Ambulation/Gait ?Ambulation/Gait assistance: Supervision ?Gait Distance (Feet): 60 Feet x 3 ?Assistive device: Rolling walker (2 wheels) ?Gait Pattern/deviations: Decreased step length - right, Decreased step length - left, Trunk flexed, Shuffle ?Gait velocity: decreased ?  ?  ?General Gait Details: Very slow cadence with occasional shuffling and cues for amb closer to the RW but steady without LOB ? ? ?Stairs ?  ?  ?  ?  ?  ? ? ?Wheelchair Mobility ?  ? ?Modified Rankin (Stroke Patients Only) ?  ? ? ?  ?Balance Overall balance assessment: Needs assistance ?Sitting-balance support: Feet supported ?Sitting balance-Leahy Scale: Good ?  ?  ?Standing balance support: During functional activity, Bilateral upper extremity supported ?Standing balance-Leahy Scale: Fair ?  ?  ?  ?  ?  ?  ?  ?  ?  ?  ?  ?  ?  ? ?  ?Cognition Arousal/Alertness: Awake/alert ?Behavior During Therapy: [facility] for tasks assessed/performed ?Overall Cognitive Status: History of cognitive impairments - at baseline ?  ?  ?  ?  ?  ?  ?  ?  ?  ?  ?  ?  ?  ?  ?  ?  ?  ?  ?  ? ?  ?  Exercises Other Exercises ?Other Exercises: Pt education provided on physiological benefits of activity ? ?  ?General Comments   ?  ?  ? ?Pertinent Vitals/Pain Pain Assessment ?Pain Assessment: No/denies pain  ? ? ?Home Living   ?  ?  ?  ?  ?  ?  ?  ?  ?  ?   ?  ?Prior Function    ?   ?  ?   ? ?PT Goals (current goals can now be found in the care plan section) Progress towards PT goals: Progressing toward goals ? ?  ?Frequency ? ? ? Min 2X/week ? ? ? ?  ?PT Plan Current plan remains appropriate  ? ? ?Co-evaluation   ?  ?  ?  ?  ? ?  ?AM-PAC PT "6 Clicks" Mobility   ?Outcome Measure ? Help needed turning from your back to your side while in a flat bed without using bedrails?: None ?Help needed moving from lying on your back to sitting on the side of a flat bed without using bedrails?: None ?Help needed moving to and from a bed to a chair (including a wheelchair)?: A Little ?Help needed standing up from a chair using your arms (e.g., wheelchair or bedside chair)?: A Little ?Help needed to walk in hospital room?: A Little ?Help needed climbing 3-5 steps with a railing? : A Little ?6 Click Score: 20 ? ?  ?End of Session Equipment Utilized During Treatment: Gait belt ?Activity Tolerance: Patient tolerated treatment well ?Patient left: in bed;with call bell/phone within reach;with bed alarm set ?Nurse Communication: Mobility status ?PT Visit Diagnosis: Muscle weakness (generalized) (M62.81);Difficulty in walking, not elsewhere classified (R26.2) ?  ? ? ?Time: 4098-1191 ?PT Time Calculation (min) (ACUTE ONLY): 25 min ? ?Charges:  $Gait Training: 23-37 mins          ?          ?D. Elly Modena PT, DPT ?07/29/21, 3:38 PM ? ? ?

## 2021-07-29 NOTE — Progress Notes (Signed)
Mobility Specialist - Progress Note ? ? 07/29/21 1400  ?Mobility  ?Activity Refused mobility  ? ? ? ?Per discussion with RN, hold on mobility at this time d/t enema being given. Will attempt session another date/time.  ? ? ?Kathee Delton ?Mobility Specialist ?07/29/21, 2:39 PM ? ? ? ? ?

## 2021-07-30 DIAGNOSIS — I38 Endocarditis, valve unspecified: Secondary | ICD-10-CM

## 2021-07-30 DIAGNOSIS — I742 Embolism and thrombosis of arteries of the upper extremities: Secondary | ICD-10-CM | POA: Diagnosis not present

## 2021-07-30 DIAGNOSIS — I509 Heart failure, unspecified: Secondary | ICD-10-CM | POA: Diagnosis not present

## 2021-07-30 LAB — GLUCOSE, CAPILLARY
Glucose-Capillary: 211 mg/dL — ABNORMAL HIGH (ref 70–99)
Glucose-Capillary: 200 mg/dL — ABNORMAL HIGH (ref 70–99)
Glucose-Capillary: 152 mg/dL — ABNORMAL HIGH (ref 70–99)
Glucose-Capillary: 283 mg/dL — ABNORMAL HIGH (ref 70–99)
Glucose-Capillary: 229 mg/dL — ABNORMAL HIGH (ref 70–99)
Glucose-Capillary: 250 mg/dL — ABNORMAL HIGH (ref 70–99)
Glucose-Capillary: 188 mg/dL — ABNORMAL HIGH (ref 70–99)

## 2021-07-30 LAB — CBC
HCT: 34.3 % — ABNORMAL LOW (ref 36.0–46.0)
Hemoglobin: 10.9 g/dL — ABNORMAL LOW (ref 12.0–15.0)
MCH: 27 pg (ref 26.0–34.0)
MCHC: 31.8 g/dL (ref 30.0–36.0)
MCV: 84.9 fL (ref 80.0–100.0)
Platelets: 211 10*3/uL (ref 150–400)
RBC: 4.04 MIL/uL (ref 3.87–5.11)
RDW: 14 % (ref 11.5–15.5)
WBC: 4.2 10*3/uL (ref 4.0–10.5)
nRBC: 0 % (ref 0.0–0.2)

## 2021-07-30 LAB — BASIC METABOLIC PANEL
Anion gap: 5 (ref 5–15)
BUN: 20 mg/dL (ref 8–23)
CO2: 28 mmol/L (ref 22–32)
Calcium: 8.9 mg/dL (ref 8.9–10.3)
Chloride: 105 mmol/L (ref 98–111)
Creatinine, Ser: 0.88 mg/dL (ref 0.44–1.00)
GFR, Estimated: >60 mL/min (ref >60)
Glucose, Bld: 198 mg/dL — ABNORMAL HIGH (ref 70–99)
Potassium: 3.5 mmol/L (ref 3.5–5.1)
Sodium: 138 mmol/L (ref 135–145)

## 2021-07-30 LAB — MAGNESIUM: Magnesium: 2 mg/dL (ref 1.7–2.4)

## 2021-07-30 MED ORDER — POTASSIUM CHLORIDE CRYS ER 20 MEQ PO TBCR
40.0000 meq | EXTENDED_RELEASE_TABLET | Freq: Once | ORAL | Status: AC
  Administered 2021-07-30: 40 meq via ORAL
  Filled 2021-07-30: qty 2

## 2021-07-30 MED ORDER — INSULIN GLARGINE-YFGN 100 UNIT/ML ~~LOC~~ SOLN
10.0000 [IU] | Freq: Every day | SUBCUTANEOUS | Status: DC
  Administered 2021-07-30 – 2021-08-01 (×3): 10 [IU] via SUBCUTANEOUS
  Filled 2021-07-30 (×4): qty 0.1

## 2021-07-30 MED ORDER — ACETAMINOPHEN 325 MG PO TABS: 650.0000 mg | ORAL_TABLET | Freq: Four times a day (QID) | ORAL | Status: DC | PRN

## 2021-07-30 MED ORDER — FUROSEMIDE 10 MG/ML IJ SOLN
40.0000 mg | Freq: Two times a day (BID) | INTRAMUSCULAR | Status: DC
  Administered 2021-07-30 – 2021-08-02 (×6): 40 mg via INTRAVENOUS
  Filled 2021-07-30 (×6): qty 4

## 2021-07-30 NOTE — Progress Notes (Signed)
? ? ? ?Progress Note  ? ? ?Dim Cardi  OZH:[id number] DOB: 10-16-1923  DOA: 07/25/2021 ?PCP: [facility]  ? ? ? ? ?Brief Narrative:  ? ? ?Medical records reviewed and are as summarized below: ? ? ? ?[name] is a 88 year old female with sick sinus syndrome, hypertension, hyperlipidemia, insulin-dependent diabetes mellitus, depression, dementia, GERD, chronic constipation, who presents emergency department for chief concerns of sudden onset of right upper extremity pain. ? ? ?Initial vitals in the emergency department showed temperature of 97.5, respiration rate of 18, heart rate 64, blood pressure 140/67, SPO2 99% on room air. ? ?Serum sodium is 141, potassium 4.1, chloride of 98, bicarb 32, BUN of 30, serum creatinine 1.03, GFR greater than 50, nonfasting blood glucose 246, WBC 4.9, hemoglobin 12.3, platelets of 245. ? ?CTA of the right upper extremity with and without contrast: Was read as abrupt cut off of the distal right radial artery with some reconstitution of the forearm vessels as above.  Concerning for possible embolus.  Area of decreased perfusion of the lower pole of the right kidney.  Cannot exclude embolic involvement and infarct.  Cholelithiasis. ? ?CT of the head without contrast read as no evidence of intracranial abnormality.  Small vessel ischemic changes. ? ? ? ?She was admitted to the hospital for acute occlusion of right upper extremity artery due to thrombus.  She was treated with IV heparin drip and transition to Eliquis. ? ? ? ? ? ?Assessment/Plan:  ? ?Principal Problem: ?  Acute occlusion of artery of upper extremity due to thrombus [facility]) ?Active Problems: ?  Atrial fibrillation (HCC) ?  Sick sinus syndrome (HCC) ?  GERD without esophagitis ?  Chronic constipation ?  Dyslipidemia associated with type 2 diabetes mellitus (HCC) ?  Anxiety with depression ?  Congestive heart failure due to valvular disease (HCC) ?  S/P placement of cardiac  pacemaker ?  HTN (hypertension) ?  HLD (hyperlipidemia) ?  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 ? ? ?Body mass index is 18.92 kg/m?. ? ? ? ?  ?* Acute occlusion of artery of upper extremity due to thrombus (HCC) ?-Seen by vascular surgeon, not a good surgical candidate.  Continue Eliquis ?  ?Acute exacerbation of chronic diastolic CHF, severe MR, severe TR ?Discontinue torsemide and start IV Lasix.  Monitor BMP, daily weight and urine output.  2D echo on 07/26/2021 showed EF estimated at 65 to 70%, grade 3 diastolic dysfunction, severely dilated left and right atria, severe mitral regurgitation, severe tricuspid regurgitation ? ?Severe constipation ?- Fecal impaction has improved.  Continue laxatives.   ?  ?Left upper extremity numbness and discomfort ?This has been going on for several weeks.  Unable to obtain MRI here due to her pacemaker and she is allergic to contrast for CT scan.  This can be done outpatient at [facility] or any place that can take care of her pacemaker ? ?  ?Renal infarct, right side ?- Seen on CT, continue anticoagulation ? ?Type II DM with hyperglycemia  ?She takes Lantus 9 units twice daily at home.  Start insulin glargine at 10 units nightly.  Adjust insulin therapy based on glucose levels. ? ? ?Hypokalemia ?Improved ?  ?Other comorbidities include hypertension, anxiety, depression, GERD, sick sinus syndrome, paroxysmal atrial fibrillation ? ? ? ?Diet Order   ? ?       ?  Diet regular Room service appropriate? Yes; Fluid consistency: Thin  Diet effective now       ?  ? ?  ?  ? ?  ? ? ? ? ? ? ? ? ?  Consultants: ?None ? ?Procedures: ?None ? ? ? ?Medications:  ? ? apixaban  10 mg Oral BID  ? Followed by  ? [START ON 08/04/2021] apixaban  5 mg Oral BID  ? cholecalciferol  2,000 Units Oral Daily  ? digoxin  0.125 mg Oral Daily  ? famotidine  20 mg Oral QHS  ? insulin aspart  0-6 Units Subcutaneous TID WC  ? isosorbide mononitrate  30 mg Oral Daily  ? mirtazapine  15 mg Oral  QHS  ? pantoprazole  40 mg Oral Daily  ? polyethylene glycol  17 g Oral BID  ? senna-docusate  2 tablet Oral BID  ? torsemide  20 mg Oral Daily  ? ?Continuous Infusions: ? ? ?Anti-infectives (From admission, onward)  ? ? None  ? ?  ? ? ? ? ? ? ? ? ? ?Family Communication/Anticipated D/C date and plan/Code Status  ? ?DVT prophylaxis: Place and maintain sequential compression device Start: 07/28/21 1120 ?Place TED hose Start: 07/25/21 2215 ?apixaban (ELIQUIS) tablet 10 mg  ?apixaban (ELIQUIS) tablet 5 mg  ? ?  Code Status: DNR ? ?Family Communication: None ?Disposition Plan: Plan to discharge home in 2 days ? ? ?Status is: Inpatient ?Remains inpatient appropriate because: Hypervolemia requiring IV Lasix ? ? ? ? ? ? ?Subjective:  ? ?She complains of shortness of breath and swelling on the left upper extremity.  She said her ring feels tight on her left finger. ? ?Objective:  ? ? ?Vitals:  ? 07/30/21 0200 07/30/21 0530 07/30/21 0749 07/30/21 0900  ?BP: 122/69 131/70 (!) 150/77   ?Pulse: 91 93 94 96  ?Resp: 18 16 17 18   ?Temp: 97.9 ?F (36.6 ?C) 98.4 ?F (36.9 ?C) 97.8 ?F (36.6 ?C)   ?TempSrc: Oral Oral    ?SpO2: 97% 98% 100% 97%  ?Weight:      ?Height:      ? ?No data found. ? ? ?Intake/Output Summary (Last 24 hours) at 07/30/2021 1047 ?Last data filed at 07/30/2021 0900 ?Gross per 24 hour  ?Intake 480 ml  ?Output 0 ml  ?Net 480 ml  ? ?Filed Weights  ? 07/25/21 1855  ?Weight: 50 kg  ? ? ?Exam: ? ?GEN: NAD ?SKIN: Warm and dry ?EYES: No pallor or icterus ?ENT: MMM ?CV: RRR ?PULM: CTA B ?ABD: soft, distended, NT, +BS ?CNS: AAO x 3, non focal ?EXT: Significant b/l leg edema, no tenderness ? ? ? ?  ? ? ?Data Reviewed:  ? ?I have personally reviewed following labs and imaging studies: ? ?Labs: ?Labs show the following:  ? ?Basic Metabolic Panel: ?Recent Labs  ?Lab 07/26/21 ?0160 07/27/21 ?0431 07/28/21 ?1093 07/29/21 ?0421 07/30/21 ?2355  ?NA 142 141 141 137 138  ?K 3.2* 3.9 4.4 3.9 3.5  ?CL 103 102 104 103 105  ?CO2 33* 33* 30  29 28   ?GLUCOSE 86 165* 205* 188* 198*  ?BUN 25* 24* 20 19 20   ?CREATININE 0.82 0.91 0.83 0.91 0.88  ?CALCIUM 8.9 8.9 9.2 9.2 8.9  ?MG  --  2.1 2.2 2.1 2.0  ?PHOS  --  2.7  --   --   --   ? ?GFR ?Estimated Creatinine Clearance: 30.2 mL/min (by C-G formula based on SCr of 0.88 mg/dL). ?Liver Function Tests: ?Recent Labs  ?Lab 07/27/21 ?0431  ?AST 27  ?ALT 23  ?ALKPHOS 58  ?BILITOT 0.7  ?PROT 5.5*  ?ALBUMIN 2.8*  ? ?No results for input(s): LIPASE, AMYLASE in the last 168 hours. ?No results for input(s): AMMONIA  in the last 168 hours. ?Coagulation profile ?No results for input(s): INR, PROTIME in the last 168 hours. ? ?CBC: ?Recent Labs  ?Lab 07/25/21 ?1934 07/26/21 ?4696 07/27/21 ?0431 07/28/21 ?2952 07/29/21 ?0421 07/30/21 ?8413  ?WBC 4.9 5.0 4.5 4.1 3.9* 4.2  ?NEUTROABS 3.3  --  2.4  --   --   --   ?HGB 12.3 10.6* 10.4* 11.1* 10.6* 10.9*  ?HCT 39.9 33.8* 33.6* 34.5* 33.9* 34.3*  ?MCV 89.1 85.1 85.9 85.6 85.8 84.9  ?PLT 245 232 213 210 208 211  ? ?Cardiac Enzymes: ?No results for input(s): CKTOTAL, CKMB, CKMBINDEX, TROPONINI in the last 168 hours. ?BNP (last 3 results) ?No results for input(s): PROBNP in the last 8760 hours. ?CBG: ?Recent Labs  ?Lab 07/29/21 ?1636 07/29/21 ?1954 07/30/21 ?0100 07/30/21 ?2440 07/30/21 ?0749  ?GLUCAP 194* 182* 211* 200* 152*  ? ?D-Dimer: ?No results for input(s): DDIMER in the last 72 hours. ?Hgb A1c: ?No results for input(s): HGBA1C in the last 72 hours. ?Lipid Profile: ?No results for input(s): CHOL, HDL, LDLCALC, TRIG, CHOLHDL, LDLDIRECT in the last 72 hours. ?Thyroid function studies: ?No results for input(s): TSH, T4TOTAL, T3FREE, THYROIDAB in the last 72 hours. ? ?Invalid input(s): FREET3 ?Anemia work up: ?No results for input(s): VITAMINB12, FOLATE, FERRITIN, TIBC, IRON, RETICCTPCT in the last 72 hours. ?Sepsis Labs: ?Recent Labs  ?Lab 07/27/21 ?0431 07/28/21 ?1027 07/29/21 ?0421 07/30/21 ?2536  ?WBC 4.5 4.1 3.9* 4.2  ? ? ?Microbiology ?No results found for this or any previous  visit (from the past 240 hour(s)). ? ?Procedures and diagnostic studies: ? ?DG ABD ACUTE 2+V W 1V CHEST ? ?Result Date: 07/29/2021 ?CLINICAL DATA:  Abdominal pain EXAM: DG ABDOMEN ACUTE WITH 1 VIEW CHEST COMP

## 2021-07-30 NOTE — Care Management Important Message (Signed)
Important Message ? ?Patient Details  ?Name: Elizabeth Novak ?MRN: [id number] ?Date of Birth: [birth year] ? ? ?Medicare Important Message Given:  Yes ? ? ? ? ?Dannette Barbara ?07/30/2021, 11:51 AM ?

## 2021-07-30 NOTE — Progress Notes (Signed)
Physical Therapy Treatment ?Patient Details ?Name: Elizabeth Novak ?MRN: [id number] ?DOB: Aug 20, 1923 ?Today's Date: 07/30/2021 ? ? ?History of Present Illness Pt is a 88 y/o F admitted on 07/25/21 after presenting with c/o sudden onset of RUE pain. Emergent CT angiogram of the right upper extremity showed abrupt cut off of the distal radial artery with some reconstitution of the forearm vessels. Pt was seen by vascular surgery in the ER, started on heparin infusion overnight; pt is currently being managed conservatively as she is not a good surgical candidate. PMH: SSS s/p pacemaker, HTN, HLD, insulin dependent DM2, depression, dementia, GERD ? ?  ?PT Comments  ? ? Pt was pleasant and motivated to participate during the session and put forth good effort throughout. Pt required no physical assistance with functional tasks and was generally steady with transfers and gait.  Pt was able to amb 100 feet this session with slow but steady cadence with no adverse symptoms noted and with SpO2 and HR WNL on room air. Pt will benefit from HHPT upon discharge to safely address deficits listed in patient problem list for decreased caregiver assistance and eventual return to PLOF. ? ?   ?Recommendations for follow up therapy are one component of a multi-disciplinary discharge planning process, led by the attending physician.  Recommendations may be updated based on patient status, additional functional criteria and insurance authorization. ? ?Follow Up Recommendations ? Home health PT ?  ?  ?Assistance Recommended at Discharge Frequent or constant Supervision/Assistance  ?Patient can return home with the following A little help with bathing/dressing/bathroom;Assistance with cooking/housework;Assist for transportation;Direct supervision/assist for medications management;Help with stairs or ramp for entrance;A little help with walking and/or transfers ?  ?Equipment Recommendations ? None recommended by PT  ?  ?Recommendations for  Other Services   ? ? ?  ?Precautions / Restrictions Precautions ?Precautions: Fall ?Restrictions ?Weight Bearing Restrictions: No  ?  ? ?Mobility ? Bed Mobility ?  ?  ?  ?  ?  ?  ?  ?General bed mobility comments: NT, pt in recliner ?  ? ?Transfers ?Overall transfer level: Needs assistance ?Equipment used: Rolling walker (2 wheels) ?Transfers: Sit to/from Stand ?Sit to Stand: Supervision ?  ?  ?  ?  ?  ?General transfer comment: min cuing for hand placement and technique ?  ? ?Ambulation/Gait ?Ambulation/Gait assistance: Supervision ?Gait Distance (Feet): 100 Feet ?Assistive device: Rolling walker (2 wheels) ?Gait Pattern/deviations: Decreased step length - right, Decreased step length - left, Trunk flexed, Shuffle ?Gait velocity: decreased ?  ?  ?General Gait Details: Very slow cadence with occasional shuffling and cues for amb closer to the RW but steady without LOB ? ? ?Stairs ?  ?  ?  ?  ?  ? ? ?Wheelchair Mobility ?  ? ?Modified Rankin (Stroke Patients Only) ?  ? ? ?  ?Balance Overall balance assessment: Needs assistance ?Sitting-balance support: Feet supported ?Sitting balance-Leahy Scale: Good ?  ?  ?Standing balance support: During functional activity, Bilateral upper extremity supported, Reliant on assistive device for balance ?Standing balance-Leahy Scale: Fair ?  ?  ?  ?  ?  ?  ?  ?  ?  ?  ?  ?  ?  ? ?  ?Cognition Arousal/Alertness: Awake/alert ?Behavior During Therapy: [facility] for tasks assessed/performed ?Overall Cognitive Status: History of cognitive impairments - at baseline ?  ?  ?  ?  ?  ?  ?  ?  ?  ?  ?  ?  ?  ?  ?  ?  ?  ?  ?  ? ?  ?  Exercises Total Joint Exercises ?Ankle Circles/Pumps: Strengthening, Both, 10 reps ?Long Arc Quad: Strengthening, Both, 10 reps (with manual resistance) ?Knee Flexion: Strengthening, Both, 10 reps (with manual resistance) ? ?  ?General Comments   ?  ?  ? ?Pertinent Vitals/Pain Pain Assessment ?Pain Assessment: No/denies pain  ? ? ?Home Living   ?  ?  ?  ?  ?  ?  ?  ?  ?  ?    ?  ?Prior Function    ?  ?  ?   ? ?PT Goals (current goals can now be found in the care plan section) Progress towards PT goals: Progressing toward goals ? ?  ?Frequency ? ? ? Min 2X/week ? ? ? ?  ?PT Plan Current plan remains appropriate  ? ? ?Co-evaluation   ?  ?  ?  ?  ? ?  ?AM-PAC PT "6 Clicks" Mobility   ?Outcome Measure ? Help needed turning from your back to your side while in a flat bed without using bedrails?: None ?Help needed moving from lying on your back to sitting on the side of a flat bed without using bedrails?: None ?Help needed moving to and from a bed to a chair (including a wheelchair)?: A Little ?Help needed standing up from a chair using your arms (e.g., wheelchair or bedside chair)?: A Little ?Help needed to walk in hospital room?: A Little ?Help needed climbing 3-5 steps with a railing? : A Little ?6 Click Score: 20 ? ?  ?End of Session Equipment Utilized During Treatment: Gait belt ?Activity Tolerance: Patient tolerated treatment well ?Patient left: in chair;with call bell/phone within reach ?Nurse Communication: Mobility status;Other (comment) (Pt in need of chair alarm placement to recliner) ?PT Visit Diagnosis: Muscle weakness (generalized) (M62.81);Difficulty in walking, not elsewhere classified (R26.2) ?  ? ? ?Time: 2956-2130 ?PT Time Calculation (min) (ACUTE ONLY): 25 min ? ?Charges:  $Gait Training: 8-22 mins ?$Therapeutic Exercise: 8-22 mins          ?          ? ?D. Elly Modena PT, DPT ?07/30/21, 1:39 PM ? ? ?

## 2021-07-30 NOTE — Progress Notes (Signed)
Patient with c/o shortness of breath and feeling like heart skipping at times this am.  Oxygen saturation on room air is 97%.  Scheduled digoxin and torsemide given at this time and will secure chat MD for updates. ?

## 2021-07-30 NOTE — Progress Notes (Signed)
Occupational Therapy Treatment ?Patient Details ?Name: Elizabeth Novak ?MRN: [id number] ?DOB: 05-Jul-1923 ?Today's Date: 07/30/2021 ? ? ?History of present illness Pt is a 88 y/o F admitted on 07/25/21 after presenting with c/o sudden onset of RUE pain. Emergent CT angiogram of the right upper extremity showed abrupt cut off of the distal radial artery with some reconstitution of the forearm vessels. Pt was seen by vascular surgery in the ER, started on heparin infusion overnight; pt is currently being managed conservatively as she is not a good surgical candidate. PMH: SSS s/p pacemaker, HTN, HLD, insulin dependent DM2, depression, dementia, GERD ?  ?OT comments ? Upon entering the room, pt seated on EOB and agreeable to OT intervention. Pt is upset over not knowing plan for discharge. OT encourages pt to participate in OT intervention. She dons B slippers on with set up A and close supervision. Pt stands with min cuing for hand placement and ambulates with RW and supervision into bathroom for toileting needs. Pt is able to void and performs clothing management and transfer with min guard. Pt stands at sink and washes buttocks and peri area with min guard for balance. She ambulates to return to recliner chair in same manner as above. All needs within reach.   ? ?Recommendations for follow up therapy are one component of a multi-disciplinary discharge planning process, led by the attending physician.  Recommendations may be updated based on patient status, additional functional criteria and insurance authorization. ?   ?Follow Up Recommendations ? Home health OT  ?  ?Assistance Recommended at Discharge Intermittent Supervision/Assistance  ?Patient can return home with the following ? A little help with walking and/or transfers;A little help with bathing/dressing/bathroom;Assistance with cooking/housework;Assist for transportation;Direct supervision/assist for medications management;Help with stairs or ramp for  entrance ?  ?Equipment Recommendations ? None recommended by OT  ?  ?   ?Precautions / Restrictions Precautions ?Precautions: Fall ?Restrictions ?Weight Bearing Restrictions: No  ? ? ?  ? ?Mobility Bed Mobility ?  ?  ?  ?  ?  ?  ?  ?General bed mobility comments: Pt seated on EOB when entering the room ?  ? ?Transfers ?Overall transfer level: Needs assistance ?Equipment used: Rolling walker (2 wheels) ?Transfers: Sit to/from Stand ?Sit to Stand: Supervision ?  ?  ?  ?  ?  ?General transfer comment: min cuing for hand placement and technique ?  ?  ?Balance Overall balance assessment: Needs assistance ?Sitting-balance support: Feet supported ?Sitting balance-Leahy Scale: Good ?  ?  ?Standing balance support: During functional activity, Bilateral upper extremity supported, Reliant on assistive device for balance ?Standing balance-Leahy Scale: Fair ?  ?  ?  ?  ?  ?  ?  ?  ?  ?  ?  ?  ?   ? ?ADL either performed or assessed with clinical judgement  ? ?ADL Overall ADL's : Needs assistance/impaired ?  ?  ?Grooming: Wash/dry hands;Wash/dry face;Standing;Supervision/safety ?  ?  ?  ?Lower Body Bathing: Min guard;Sit to/from stand ?Lower Body Bathing Details (indicate cue type and reason): to wash buttocks and peri area while standing at sink ?  ?  ?  ?  ?Toilet Transfer: Min guard;Ambulation;Regular Toilet;Rolling walker (2 wheels) ?  ?Toileting- Architect and Hygiene: Min guard;Sit to/from stand ?  ?  ?  ?  ?  ?  ? ?Extremity/Trunk Assessment Upper Extremity Assessment ?Upper Extremity Assessment: Generalized weakness ?  ?Lower Extremity Assessment ?Lower Extremity Assessment: Generalized weakness ?  ?  ?  ? ?  Vision Patient Visual Report: No change from baseline ?  ?  ?   ?   ? ?Cognition Arousal/Alertness: Awake/alert ?Behavior During Therapy: [facility] for tasks assessed/performed ?Overall Cognitive Status: History of cognitive impairments - at baseline ?  ?  ?  ?  ?  ?  ?  ?  ?  ?  ?  ?  ?  ?  ?  ?  ?  ?  ?  ?   ?    ?   ?   ? ? ?Pertinent Vitals/ Pain       Pain Assessment ?Pain Assessment: No/denies pain ? ?   ?   ? ?Frequency ? Min 2X/week  ? ? ? ? ?  ?Progress Toward Goals ? ?OT Goals(current goals can now be found in the care plan section) ? Progress towards OT goals: Progressing toward goals ? ?Acute Rehab OT Goals ?Patient Stated Goal: to get better and go home ?OT Goal Formulation: With patient ?Time For Goal Achievement: 08/11/21 ?Potential to Achieve Goals: Good  ?Plan Discharge plan remains appropriate;Frequency remains appropriate   ? ?   ?AM-PAC OT "6 Clicks" Daily Activity     ?Outcome Measure ? ? Help from another person eating meals?: None ?Help from another person taking care of personal grooming?: None ?Help from another person toileting, which includes using toliet, bedpan, or urinal?: A Little ?Help from another person bathing (including washing, rinsing, drying)?: A Little ?Help from another person to put on and taking off regular upper body clothing?: A Little ?Help from another person to put on and taking off regular lower body clothing?: A Little ?6 Click Score: 20 ? ?  ?End of Session Equipment Utilized During Treatment: Rolling walker (2 wheels) ? ?OT Visit Diagnosis: Other abnormalities of gait and mobility (R26.89);Muscle weakness (generalized) (M62.81);Pain ?  ?Activity Tolerance Patient tolerated treatment well ?  ?Patient Left with call bell/phone within reach;in chair;with chair alarm set ?  ?Nurse Communication Mobility status ?  ? ?   ? ?Time: 7829-5621 ?OT Time Calculation (min): 20 min ? ?Charges: OT General Charges ?$OT Visit: 1 Visit ?OT Treatments ?$Self Care/Home Management : 8-22 mins ? ?Jackquline Denmark, MS, OTR/L , CBIS ?ascom [phone]  ?07/30/21, 12:19 PM  ?

## 2021-07-31 DIAGNOSIS — I38 Endocarditis, valve unspecified: Secondary | ICD-10-CM | POA: Diagnosis not present

## 2021-07-31 DIAGNOSIS — I509 Heart failure, unspecified: Secondary | ICD-10-CM | POA: Diagnosis not present

## 2021-07-31 DIAGNOSIS — I742 Embolism and thrombosis of arteries of the upper extremities: Secondary | ICD-10-CM | POA: Diagnosis not present

## 2021-07-31 LAB — GLUCOSE, CAPILLARY
Glucose-Capillary: 96 mg/dL (ref 70–99)
Glucose-Capillary: 171 mg/dL — ABNORMAL HIGH (ref 70–99)
Glucose-Capillary: 173 mg/dL — ABNORMAL HIGH (ref 70–99)
Glucose-Capillary: 213 mg/dL — ABNORMAL HIGH (ref 70–99)

## 2021-07-31 LAB — BASIC METABOLIC PANEL
Anion gap: 6 (ref 5–15)
BUN: 20 mg/dL (ref 8–23)
CO2: 28 mmol/L (ref 22–32)
Calcium: 8.8 mg/dL — ABNORMAL LOW (ref 8.9–10.3)
Chloride: 105 mmol/L (ref 98–111)
Creatinine, Ser: 0.85 mg/dL (ref 0.44–1.00)
GFR, Estimated: >60 mL/min (ref >60)
Glucose, Bld: 105 mg/dL — ABNORMAL HIGH (ref 70–99)
Potassium: 3.8 mmol/L (ref 3.5–5.1)
Sodium: 139 mmol/L (ref 135–145)

## 2021-07-31 LAB — MAGNESIUM: Magnesium: 1.9 mg/dL (ref 1.7–2.4)

## 2021-07-31 LAB — CBC
HCT: 35.6 % — ABNORMAL LOW (ref 36.0–46.0)
Hemoglobin: 11.4 g/dL — ABNORMAL LOW (ref 12.0–15.0)
MCH: 27.3 pg (ref 26.0–34.0)
MCHC: 32 g/dL (ref 30.0–36.0)
MCV: 85.2 fL (ref 80.0–100.0)
Platelets: 222 10*3/uL (ref 150–400)
RBC: 4.18 MIL/uL (ref 3.87–5.11)
RDW: 14 % (ref 11.5–15.5)
WBC: 4.8 10*3/uL (ref 4.0–10.5)
nRBC: 0 % (ref 0.0–0.2)

## 2021-07-31 NOTE — Progress Notes (Signed)
? ? ? ?Progress Note  ? ? ?Elizabeth Novak  GEX:[id number] DOB: 1923-05-17  DOA: 07/25/2021 ?PCP: Diona Browner [facility]  ? ? ? ? ?Brief Narrative:  ? ? ?Medical records reviewed and are as summarized below: ? ? ? ?[name] is a 88 year old female with sick sinus syndrome, hypertension, hyperlipidemia, insulin-dependent diabetes mellitus, depression, dementia, GERD, chronic constipation, who presents emergency department for chief concerns of sudden onset of right upper extremity pain. ? ? ?Initial vitals in the emergency department showed temperature of 97.5, respiration rate of 18, heart rate 64, blood pressure 140/67, SPO2 99% on room air. ? ?Serum sodium is 141, potassium 4.1, chloride of 98, bicarb 32, BUN of 30, serum creatinine 1.03, GFR greater than 50, nonfasting blood glucose 246, WBC 4.9, hemoglobin 12.3, platelets of 245. ? ?CTA of the right upper extremity with and without contrast: Was read as abrupt cut off of the distal right radial artery with some reconstitution of the forearm vessels as above.  Concerning for possible embolus.  Area of decreased perfusion of the lower pole of the right kidney.  Cannot exclude embolic involvement and infarct.  Cholelithiasis. ? ?CT of the head without contrast read as no evidence of intracranial abnormality.  Small vessel ischemic changes. ? ? ? ?She was admitted to the hospital for acute occlusion of right upper extremity artery due to thrombus.  She was treated with IV heparin drip and transition to Eliquis. ? ? ? ? ? ?Assessment/Plan:  ? ?Principal Problem: ?  Acute occlusion of artery of upper extremity due to thrombus [facility]) ?Active Problems: ?  Atrial fibrillation (HCC) ?  Sick sinus syndrome (HCC) ?  GERD without esophagitis ?  Chronic constipation ?  Dyslipidemia associated with type 2 diabetes mellitus (HCC) ?  Anxiety with depression ?  Congestive heart failure due to valvular disease (HCC) ?  S/P placement of cardiac  pacemaker ?  HTN (hypertension) ?  HLD (hyperlipidemia) ?  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 ? ? ?Body mass index is 18.92 kg/m?. ? ? ? ?  ?Acute occlusion of artery of upper extremity due to thrombus [facility]) ?-Seen by vascular surgeon, not a good surgical candidate.  Continue Eliquis ? ?Acute exacerbation of chronic diastolic CHF, severe MR, severe TR ?Continue IV Lasix.  Monitor BMP, daily weight and urine output.  2D echo on 07/26/2021 showed EF estimated at 65 to 70%, grade 3 diastolic dysfunction, severely dilated left and right atria, severe mitral regurgitation, severe tricuspid regurgitation ? ?Severe constipation, fecal impaction ?- Improved.  Continue laxatives.   ?  ?Left upper extremity numbness and discomfort ?This has been going on for several weeks.  Unable to obtain MRI here due to her pacemaker and she is allergic to contrast for CT scan.  This can be done outpatient at [facility] or any place that can take care of her pacemaker ? ?  ?Renal infarct, right side ?- Seen on CT, continue Eliquis ? ?Type II DM with hyperglycemia  ?She takes Lantus 9 units twice daily at home.  Continue insulin glargine at 10 units nightly.  Adjust insulin therapy based on glucose levels. ? ? ?Hypokalemia ?Improved ?  ?Other comorbidities include hypertension, anxiety, depression, GERD, sick sinus syndrome, paroxysmal atrial fibrillation ? ? ? ?Diet Order   ? ?       ?  Diet regular Room service appropriate? Yes; Fluid consistency: Thin  Diet effective now       ?  ? ?  ?  ? ?  ? ? ? ? ? ? ? ? ?  Consultants: ?None ? ?Procedures: ?None ? ? ? ?Medications:  ? ? apixaban  10 mg Oral BID  ? Followed by  ? [START ON 08/04/2021] apixaban  5 mg Oral BID  ? cholecalciferol  2,000 Units Oral Daily  ? digoxin  0.125 mg Oral Daily  ? famotidine  20 mg Oral QHS  ? furosemide  40 mg Intravenous BID  ? insulin aspart  0-6 Units Subcutaneous TID WC  ? insulin glargine-yfgn  10 Units Subcutaneous QHS  ? isosorbide  mononitrate  30 mg Oral Daily  ? mirtazapine  15 mg Oral QHS  ? pantoprazole  40 mg Oral Daily  ? polyethylene glycol  17 g Oral BID  ? senna-docusate  2 tablet Oral BID  ? ?Continuous Infusions: ? ? ?Anti-infectives (From admission, onward)  ? ? None  ? ?  ? ? ? ? ? ? ? ? ? ?Family Communication/Anticipated D/C date and plan/Code Status  ? ?DVT prophylaxis: Place and maintain sequential compression device Start: 07/28/21 1120 ?Place TED hose Start: 07/25/21 2215 ?apixaban (ELIQUIS) tablet 10 mg  ?apixaban (ELIQUIS) tablet 5 mg  ? ?  Code Status: DNR ? ?Family Communication: None ?Disposition Plan: Plan to discharge home in 2 days ? ? ?Status is: Inpatient ?Remains inpatient appropriate because: Hypervolemia requiring IV Lasix ? ? ? ? ? ? ?Subjective:  ? ?Interval events noted.  Breathing is better today.  Her legs are still swollen. ? ?Objective:  ? ? ?Vitals:  ? 07/31/21 0634 07/31/21 0707 07/31/21 1155 07/31/21 1609  ?BP:  135/66 127/67 (!) 114/54  ?Pulse:  75 75 60  ?Resp: 19 16 16 16   ?Temp:  97.7 ?F (36.5 ?C) 97.7 ?F (36.5 ?C) 97.9 ?F (36.6 ?C)  ?TempSrc:  Oral Oral Oral  ?SpO2:  100% 99% 96%  ?Weight:      ?Height:      ? ?No data found. ? ? ?Intake/Output Summary (Last 24 hours) at 07/31/2021 1654 ?Last data filed at 07/31/2021 0630 ?Gross per 24 hour  ?Intake 360 ml  ?Output --  ?Net 360 ml  ? ?Filed Weights  ? 07/25/21 1855  ?Weight: 50 kg  ? ? ?Exam: ? ?GEN: NAD ?SKIN: Warm and dry ?EYES: EOMI ?ENT: MMM ?CV: RRR, loud systolic murmur heard loudest in the mitral area with radiation to the axilla ?PULM: CTA B ?ABD: soft, ND, NT, +BS ?CNS: AAO x 3, non focal ?EXT: Edema bilateral legs and feet appear to be slowly improving.  No tenderness ? ? ? ? ? ? ?  ? ? ?Data Reviewed:  ? ?I have personally reviewed following labs and imaging studies: ? ?Labs: ?Labs show the following:  ? ?Basic Metabolic Panel: ?Recent Labs  ?Lab 07/27/21 ?0431 07/28/21 ?0981 07/29/21 ?0421 07/30/21 ?1914 07/31/21 ?0602  ?NA 141 141 137  138 139  ?K 3.9 4.4 3.9 3.5 3.8  ?CL 102 104 103 105 105  ?CO2 33* 30 29 28 28   ?GLUCOSE 165* 205* 188* 198* 105*  ?BUN 24* 20 19 20 20   ?CREATININE 0.91 0.83 0.91 0.88 0.85  ?CALCIUM 8.9 9.2 9.2 8.9 8.8*  ?MG 2.1 2.2 2.1 2.0 1.9  ?PHOS 2.7  --   --   --   --   ? ?GFR ?Estimated Creatinine Clearance: 31.3 mL/min (by C-G formula based on SCr of 0.85 mg/dL). ?Liver Function Tests: ?Recent Labs  ?Lab 07/27/21 ?0431  ?AST 27  ?ALT 23  ?ALKPHOS 58  ?BILITOT 0.7  ?PROT 5.5*  ?ALBUMIN 2.8*  ? ?  No results for input(s): LIPASE, AMYLASE in the last 168 hours. ?No results for input(s): AMMONIA in the last 168 hours. ?Coagulation profile ?No results for input(s): INR, PROTIME in the last 168 hours. ? ?CBC: ?Recent Labs  ?Lab 07/25/21 ?1934 07/26/21 ?6433 07/27/21 ?0431 07/28/21 ?2951 07/29/21 ?0421 07/30/21 ?8841 07/31/21 ?0602  ?WBC 4.9   < > 4.5 4.1 3.9* 4.2 4.8  ?NEUTROABS 3.3  --  2.4  --   --   --   --   ?HGB 12.3   < > 10.4* 11.1* 10.6* 10.9* 11.4*  ?HCT 39.9   < > 33.6* 34.5* 33.9* 34.3* 35.6*  ?MCV 89.1   < > 85.9 85.6 85.8 84.9 85.2  ?PLT 245   < > 213 210 208 211 222  ? < > = values in this interval not displayed.  ? ?Cardiac Enzymes: ?No results for input(s): CKTOTAL, CKMB, CKMBINDEX, TROPONINI in the last 168 hours. ?BNP (last 3 results) ?No results for input(s): PROBNP in the last 8760 hours. ?CBG: ?Recent Labs  ?Lab 07/30/21 ?1956 07/30/21 ?2221 07/31/21 ?6606 07/31/21 ?1209 07/31/21 ?1639  ?GLUCAP 250* 188* 96 171* 173*  ? ?D-Dimer: ?No results for input(s): DDIMER in the last 72 hours. ?Hgb A1c: ?No results for input(s): HGBA1C in the last 72 hours. ?Lipid Profile: ?No results for input(s): CHOL, HDL, LDLCALC, TRIG, CHOLHDL, LDLDIRECT in the last 72 hours. ?Thyroid function studies: ?No results for input(s): TSH, T4TOTAL, T3FREE, THYROIDAB in the last 72 hours. ? ?Invalid input(s): FREET3 ?Anemia work up: ?No results for input(s): VITAMINB12, FOLATE, FERRITIN, TIBC, IRON, RETICCTPCT in the last 72 hours. ?Sepsis  Labs: ?Recent Labs  ?Lab 07/28/21 ?3016 07/29/21 ?0421 07/30/21 ?0109 07/31/21 ?0602  ?WBC 4.1 3.9* 4.2 4.8  ? ? ?Microbiology ?No results found for this or any previous visit (from the past 240 hour(s)). ? ?P

## 2021-07-31 NOTE — Progress Notes (Signed)
Occupational Therapy Treatment ?Patient Details ?Name: Elizabeth Novak ?MRN: [id number] ?DOB: 1923/05/08 ?Today's Date: 07/31/2021 ? ? ?History of present illness Pt is a 88 y/o F admitted on 07/25/21 after presenting with c/o sudden onset of RUE pain. Emergent CT angiogram of the right upper extremity showed abrupt cut off of the distal radial artery with some reconstitution of the forearm vessels. Pt was seen by vascular surgery in the ER, started on heparin infusion overnight; pt is currently being managed conservatively as she is not a good surgical candidate. PMH: SSS s/p pacemaker, HTN, HLD, insulin dependent DM2, depression, dementia, GERD ?  ?OT comments ? Upon entering the room, pt seated on BSC having BM and demonstrates ability to perform hygiene and clothing management with supervision only for safety. Pt stands at sink for hand hygiene,brushes teeth, and then washes buttocks and peri area. Pt returning to sit on EOB to change brief with supervision for sit <>stand without use of AD. OT assisting pt with donning B TED hose. Pt returns to supine without physical assistance. All needs within reach.   ? ?Recommendations for follow up therapy are one component of a multi-disciplinary discharge planning process, led by the attending physician.  Recommendations may be updated based on patient status, additional functional criteria and insurance authorization. ?   ?Follow Up Recommendations ? Home health OT  ?  ?Assistance Recommended at Discharge Intermittent Supervision/Assistance  ?Patient can return home with the following ? A little help with walking and/or transfers;A little help with bathing/dressing/bathroom;Assistance with cooking/housework;Assist for transportation;Direct supervision/assist for medications management;Help with stairs or ramp for entrance ?  ?Equipment Recommendations ? None recommended by OT  ?  ?   ?Precautions / Restrictions Precautions ?Precautions: Fall  ? ? ?  ? ?Mobility Bed  Mobility ?Overal bed mobility: Modified Independent ?  ?  ?  ?  ?  ?  ?  ?  ? ?Transfers ?Overall transfer level: Needs assistance ?Equipment used: None ?Transfers: Sit to/from Stand ?Sit to Stand: Supervision ?  ?  ?  ?  ?  ?  ?  ?  ?Balance Overall balance assessment: Needs assistance ?Sitting-balance support: Feet supported ?Sitting balance-Leahy Scale: Good ?  ?  ?Standing balance support: During functional activity, Bilateral upper extremity supported ?Standing balance-Leahy Scale: Fair ?  ?  ?  ?  ?  ?  ?  ?  ?  ?  ?  ?  ?   ? ?ADL either performed or assessed with clinical judgement  ? ?ADL Overall ADL's : Needs assistance/impaired ?  ?  ?Grooming: Wash/dry hands;Wash/dry face;Standing;Supervision/safety ?  ?  ?  ?Lower Body Bathing: Supervison/ safety;Sit to/from stand ?  ?  ?  ?Lower Body Dressing: Supervision/safety;Sit to/from stand ?  ?Toilet Transfer: Supervision/safety;BSC/3in1 ?  ?Toileting- Clothing Manipulation and Hygiene: Supervision/safety ?  ?  ?  ?Functional mobility during ADLs: Supervision/safety ?  ?  ? ?Extremity/Trunk Assessment Upper Extremity Assessment ?Upper Extremity Assessment: Generalized weakness;Overall [facility] for tasks assessed ?  ?Lower Extremity Assessment ?Lower Extremity Assessment: Generalized weakness;Overall [facility] for tasks assessed ?  ?  ?  ? ?Vision Patient Visual Report: No change from baseline ?  ?  ?   ?   ? ?Cognition Arousal/Alertness: Awake/alert ?Behavior During Therapy: [facility] for tasks assessed/performed ?Overall Cognitive Status: History of cognitive impairments - at baseline ?  ?  ?  ?  ?  ?  ?  ?  ?  ?  ?  ?  ?  ?  ?  ?  ?  General Comments: Pt is very pleasant and cooperative overall ?  ?  ?   ?   ?   ?   ? ? ?Pertinent Vitals/ Pain       Pain Assessment ?Pain Assessment: No/denies pain ? ?   ?   ? ?Frequency ? Min 2X/week  ? ? ? ? ?  ?Progress Toward Goals ? ?OT Goals(current goals can now be found in the care plan section) ? Progress towards OT goals: Progressing  toward goals ? ?Acute Rehab OT Goals ?Patient Stated Goal: to get better and go home ?OT Goal Formulation: With patient ?Time For Goal Achievement: 08/11/21 ?Potential to Achieve Goals: Good  ?Plan Discharge plan remains appropriate;Frequency remains appropriate   ? ?   ?AM-PAC OT "6 Clicks" Daily Activity     ?Outcome Measure ? ? Help from another person eating meals?: None ?Help from another person taking care of personal grooming?: None ?Help from another person toileting, which includes using toliet, bedpan, or urinal?: None ?Help from another person bathing (including washing, rinsing, drying)?: A Little ?Help from another person to put on and taking off regular upper body clothing?: None ?Help from another person to put on and taking off regular lower body clothing?: A Little ?6 Click Score: 22 ? ?  ?End of Session   ? ?OT Visit Diagnosis: Other abnormalities of gait and mobility (R26.89);Muscle weakness (generalized) (M62.81) ?Pain - Right/Left: Right ?Pain - part of body: Arm ?  ?Activity Tolerance Patient tolerated treatment well ?  ?Patient Left with call bell/phone within reach;in bed;with bed alarm set ?  ?Nurse Communication Mobility status ?  ? ?   ? ?Time: 1610-9604 ?OT Time Calculation (min): 18 min ? ?Charges: OT General Charges ?$OT Visit: 1 Visit ?OT Treatments ?$Self Care/Home Management : 8-22 mins ? ?Jackquline Denmark, MS, OTR/L , CBIS ?ascom [phone]  ?07/31/21, 1:34 PM  ?

## 2021-07-31 NOTE — Progress Notes (Signed)
Mobility Specialist - Progress Note ? ? 07/31/21 1300  ?Mobility  ?Activity Ambulated with assistance in room;Transferred from bed to chair  ?Level of Assistance Standby assist, set-up cues, supervision of patient - no hands on  ?Assistive Device Front wheel walker  ?Distance Ambulated (ft) 12 ft  ?Activity Response Tolerated well  ?$Mobility charge 1 Mobility  ? ? ? ?Pt lying in bed upon arrival, utilizing RA. Pt ambulated in room to transfer to recliner with supervision. No complaints. Pt inquiring about d/c plans. Pt left in chair with needs in reach.  ? ? ?Kathee Delton ?Mobility Specialist ?07/31/21, 1:29 PM ? ? ? ? ?

## 2021-07-31 NOTE — Progress Notes (Signed)
Physical Therapy Treatment ?Patient Details ?Name: Elizabeth Novak ?MRN: [id number] ?DOB: 12-May-1923 ?Today's Date: 07/31/2021 ? ? ?History of Present Illness Pt is a 88 y/o F admitted on 07/25/21 after presenting with c/o sudden onset of RUE pain. Emergent CT angiogram of the right upper extremity showed abrupt cut off of the distal radial artery with some reconstitution of the forearm vessels. Pt was seen by vascular surgery in the ER, started on heparin infusion overnight; pt is currently being managed conservatively as she is not a good surgical candidate. PMH: SSS s/p pacemaker, HTN, HLD, insulin dependent DM2, depression, dementia, GERD ? ?  ?PT Comments  ? ? Patient is agreeable to PT and is making progress towards meeting PT goals. She is able to ambulate with supervision using rolling walker with no loss of balance. Multiple standing rest breaks, more due to distraction, however patient is able to carry on conversation with walking without shortness of breath. Slow moving but no loss of balance. Recommend to continue PT to maximize independence and facilitate return to prior level of function.  ?  ?Recommendations for follow up therapy are one component of a multi-disciplinary discharge planning process, led by the attending physician.  Recommendations may be updated based on patient status, additional functional criteria and insurance authorization. ? ?Follow Up Recommendations ? Home health PT ?  ?  ?Assistance Recommended at Discharge Frequent or constant Supervision/Assistance  ?Patient can return home with the following A little help with bathing/dressing/bathroom;Assistance with cooking/housework;Assist for transportation;Direct supervision/assist for medications management;Help with stairs or ramp for entrance;A little help with walking and/or transfers ?  ?Equipment Recommendations ? None recommended by PT  ?  ?Recommendations for Other Services   ? ? ?  ?Precautions / Restrictions  Precautions ?Precautions: Fall ?Restrictions ?Weight Bearing Restrictions: No  ?  ? ?Mobility ? Bed Mobility ?Overal bed mobility: Modified Independent ?  ?  ?  ?  ?  ?  ?  ?  ? ?Transfers ?Overall transfer level: Needs assistance ?Equipment used: None ?Transfers: Sit to/from Stand ?Sit to Stand: Supervision ?  ?  ?  ?  ?  ?General transfer comment: verbal cues for technique and safety ?  ? ?Ambulation/Gait ?Ambulation/Gait assistance: Supervision ?Gait Distance (Feet): 125 Feet ?Assistive device: Rolling walker (2 wheels) ?Gait Pattern/deviations: Decreased step length - right, Decreased step length - left, Trunk flexed, Shuffle ?Gait velocity: decreased ?  ?  ?General Gait Details: patient ambulated multiple laps in the room, she declined going in hallway due to not having clothing. occasional cues for rolling walker placement for safety. no loss of balance with walking. encouraged routine walking for conditioning (with staff assistance while in the hospital). ? ? ?Stairs ?  ?  ?  ?  ?  ? ? ?Wheelchair Mobility ?  ? ?Modified Rankin (Stroke Patients Only) ?  ? ? ?  ?Balance Overall balance assessment: Needs assistance ?Sitting-balance support: Feet supported ?Sitting balance-Leahy Scale: Good ?  ?  ?Standing balance support: During functional activity, Bilateral upper extremity supported ?Standing balance-Leahy Scale: Fair ?  ?  ?  ?  ?  ?  ?  ?  ?  ?  ?  ?  ?  ? ?  ?Cognition Arousal/Alertness: Awake/alert ?Behavior During Therapy: [facility] for tasks assessed/performed ?Overall Cognitive Status: History of cognitive impairments - at baseline ?  ?  ?  ?  ?  ?  ?  ?  ?  ?  ?  ?  ?  ?  ?  ?  ?  General Comments: patient following all commands without difficulty ?  ?  ? ?  ?Exercises   ? ?  ?General Comments   ?  ?  ? ?Pertinent Vitals/Pain Pain Assessment ?Pain Assessment: No/denies pain  ? ? ?Home Living   ?  ?  ?  ?  ?  ?  ?  ?  ?  ?   ?  ?Prior Function    ?  ?  ?   ? ?PT Goals (current goals can now be found in the  care plan section) Acute Rehab PT Goals ?Patient Stated Goal: to get better ?PT Goal Formulation: With patient ?Time For Goal Achievement: 08/10/21 ?Potential to Achieve Goals: Good ?Progress towards PT goals: Progressing toward goals ? ?  ?Frequency ? ? ? Min 2X/week ? ? ? ?  ?PT Plan Current plan remains appropriate  ? ? ?Co-evaluation   ?  ?  ?  ?  ? ?  ?AM-PAC PT "6 Clicks" Mobility   ?Outcome Measure ? Help needed turning from your back to your side while in a flat bed without using bedrails?: None ?Help needed moving from lying on your back to sitting on the side of a flat bed without using bedrails?: None ?Help needed moving to and from a bed to a chair (including a wheelchair)?: A Little ?Help needed standing up from a chair using your arms (e.g., wheelchair or bedside chair)?: A Little ?Help needed to walk in hospital room?: A Little ?Help needed climbing 3-5 steps with a railing? : A Little ?6 Click Score: 20 ? ?  ?End of Session   ?Activity Tolerance: Patient tolerated treatment well ?Patient left: in chair;with call bell/phone within reach ?  ?PT Visit Diagnosis: Muscle weakness (generalized) (M62.81);Difficulty in walking, not elsewhere classified (R26.2) ?  ? ? ?Time: 2952-8413 ?PT Time Calculation (min) (ACUTE ONLY): 46 min ? ?Charges:  $Gait Training: 23-37 mins ?$Therapeutic Activity: 8-22 mins          ?          ? ?Donna Bernard, PT, MPT ? ? ? ?Ina Homes ?07/31/2021, 3:35 PM ? ?

## 2021-08-01 ENCOUNTER — Inpatient Hospital Stay: Payer: Medicare Other

## 2021-08-01 DIAGNOSIS — I38 Endocarditis, valve unspecified: Secondary | ICD-10-CM | POA: Diagnosis not present

## 2021-08-01 DIAGNOSIS — I742 Embolism and thrombosis of arteries of the upper extremities: Secondary | ICD-10-CM | POA: Diagnosis not present

## 2021-08-01 DIAGNOSIS — I509 Heart failure, unspecified: Secondary | ICD-10-CM | POA: Diagnosis not present

## 2021-08-01 LAB — CBC
HCT: 33.1 % — ABNORMAL LOW (ref 36.0–46.0)
Hemoglobin: 10.6 g/dL — ABNORMAL LOW (ref 12.0–15.0)
MCH: 27 pg (ref 26.0–34.0)
MCHC: 32 g/dL (ref 30.0–36.0)
MCV: 84.4 fL (ref 80.0–100.0)
Platelets: 198 10*3/uL (ref 150–400)
RBC: 3.92 MIL/uL (ref 3.87–5.11)
RDW: 14.2 % (ref 11.5–15.5)
WBC: 4.5 10*3/uL (ref 4.0–10.5)
nRBC: 0 % (ref 0.0–0.2)

## 2021-08-01 LAB — BASIC METABOLIC PANEL
Anion gap: 6 (ref 5–15)
BUN: 24 mg/dL — ABNORMAL HIGH (ref 8–23)
CO2: 27 mmol/L (ref 22–32)
Calcium: 8.8 mg/dL — ABNORMAL LOW (ref 8.9–10.3)
Chloride: 105 mmol/L (ref 98–111)
Creatinine, Ser: 0.84 mg/dL (ref 0.44–1.00)
GFR, Estimated: >60 mL/min (ref >60)
Glucose, Bld: 109 mg/dL — ABNORMAL HIGH (ref 70–99)
Potassium: 3.2 mmol/L — ABNORMAL LOW (ref 3.5–5.1)
Sodium: 138 mmol/L (ref 135–145)

## 2021-08-01 LAB — MAGNESIUM: Magnesium: 2.2 mg/dL (ref 1.7–2.4)

## 2021-08-01 LAB — GLUCOSE, CAPILLARY
Glucose-Capillary: 121 mg/dL — ABNORMAL HIGH (ref 70–99)
Glucose-Capillary: 178 mg/dL — ABNORMAL HIGH (ref 70–99)
Glucose-Capillary: 195 mg/dL — ABNORMAL HIGH (ref 70–99)
Glucose-Capillary: 253 mg/dL — ABNORMAL HIGH (ref 70–99)

## 2021-08-01 MED ORDER — POTASSIUM CHLORIDE CRYS ER 20 MEQ PO TBCR
40.0000 meq | EXTENDED_RELEASE_TABLET | ORAL | Status: AC
  Administered 2021-08-01 (×2): 40 meq via ORAL
  Filled 2021-08-01 (×2): qty 2

## 2021-08-01 NOTE — Care Management Important Message (Signed)
Important Message ? ?Patient Details  ?Name: Elizabeth Novak ?MRN: [id number] ?Date of Birth: 1923/11/13 ? ? ?Medicare Important Message Given:  Yes ? ? ? ? ?Dannette Barbara ?08/01/2021, 2:59 PM ?

## 2021-08-01 NOTE — Progress Notes (Signed)
Mobility Specialist - Progress Note ? ? 08/01/21 1600  ?Mobility  ?Activity Stood at bedside;Ambulated with assistance in room  ?Level of Assistance Standby assist, set-up cues, supervision of patient - no hands on  ?Assistive Device None  ?Distance Ambulated (ft) 5 ft  ?Activity Response Tolerated well  ?$Mobility charge 1 Mobility  ? ? ? ?Pt sitting in recliner upon arrival, utilizing RA. Pt reports feeling SOB states she thinks its being caused by swelling and bloating in abdomen. Pt stood at bedside and took several steps before returning to seated position. No other complaints. 71 HR, 93% O2. RN notified.  ? ? ?Kathee Delton ?Mobility Specialist ?08/01/21, 4:48 PM ? ? ? ? ?

## 2021-08-01 NOTE — Progress Notes (Incomplete)
Patient abdomen is more distended since this morning. She has passed large stool today and is passing gas. Stomach is distended, but soft with hyperactive bowel sounds. Patient reports minimal discomfort. Provider notified.  ?

## 2021-08-01 NOTE — Progress Notes (Signed)
Physical Therapy Treatment ?Patient Details ?Name: Elizabeth Novak ?MRN: [id number] ?DOB: January 09, 1924 ?Today's Date: 08/01/2021 ? ? ?History of Present Illness Pt is a 88 y/o F admitted on 07/25/21 after presenting with c/o sudden onset of RUE pain. Emergent CT angiogram of the right upper extremity showed abrupt cut off of the distal radial artery with some reconstitution of the forearm vessels. Pt was seen by vascular surgery in the ER, started on heparin infusion overnight; pt is currently being managed conservatively as she is not a good surgical candidate. PMH: SSS s/p pacemaker, HTN, HLD, insulin dependent DM2, depression, dementia, GERD ? ?  ?PT Comments  ? ? Pt was pleasant and motivated to participate during the session and put forth good effort throughout. Pt required no physical assistance during the session and reported no adverse symptoms.  Pt was steady with transfers and gait with only occasional cues needed for general sequencing.  Pt ambulated with grossly improved cadence compared to session on 07/30/21 as well as with improved activity tolerance.  Pt will benefit from HHPT upon discharge to safely address deficits listed in patient problem list for decreased caregiver assistance and eventual return to PLOF. ? ?   ?Recommendations for follow up therapy are one component of a multi-disciplinary discharge planning process, led by the attending physician.  Recommendations may be updated based on patient status, additional functional criteria and insurance authorization. ? ?Follow Up Recommendations ? Home health PT ?  ?  ?Assistance Recommended at Discharge Frequent or constant Supervision/Assistance  ?Patient can return home with the following A little help with bathing/dressing/bathroom;Assistance with cooking/housework;Assist for transportation;Direct supervision/assist for medications management;Help with stairs or ramp for entrance;A little help with walking and/or transfers ?  ?Equipment  Recommendations ? None recommended by PT  ?  ?Recommendations for Other Services   ? ? ?  ?Precautions / Restrictions Precautions ?Precautions: Fall ?Restrictions ?Weight Bearing Restrictions: No  ?  ? ?Mobility ? Bed Mobility ?Overal bed mobility: Modified Independent ?  ?  ?  ?  ?  ?  ?General bed mobility comments: Min extra time and effort only ?  ? ?Transfers ?Overall transfer level: Needs assistance ?Equipment used: Rolling walker (2 wheels) ?Transfers: Sit to/from Stand ?Sit to Stand: Supervision ?  ?  ?  ?  ?  ?General transfer comment: Min verbal cues for hand placement ?  ? ?Ambulation/Gait ?Ambulation/Gait assistance: Supervision ?Gait Distance (Feet): 150 Feet ?Assistive device: Rolling walker (2 wheels) ?Gait Pattern/deviations: Decreased step length - right, Decreased step length - left, Trunk flexed, Shuffle ?Gait velocity: decreased ?  ?  ?General Gait Details: Mod verbal cues for amb closer to the RW with upright posture, very slow cadence with occasional shuffling steps but steady without LOB ? ? ?Stairs ?  ?  ?  ?  ?  ? ? ?Wheelchair Mobility ?  ? ?Modified Rankin (Stroke Patients Only) ?  ? ? ?  ?Balance Overall balance assessment: Needs assistance ?Sitting-balance support: Feet supported ?Sitting balance-Leahy Scale: Good ?  ?  ?Standing balance support: During functional activity, Bilateral upper extremity supported ?Standing balance-Leahy Scale: Fair ?  ?  ?  ?  ?  ?  ?  ?  ?  ?  ?  ?  ?  ? ?  ?Cognition Arousal/Alertness: Awake/alert ?Behavior During Therapy: [facility] for tasks assessed/performed ?Overall Cognitive Status: History of cognitive impairments - at baseline ?  ?  ?  ?  ?  ?  ?  ?  ?  ?  ?  ?  ?  ?  ?  ?  ?  ?  ?  ? ?  ?  Exercises Total Joint Exercises ?Ankle Circles/Pumps: Strengthening, Both, 10 reps ?Long [employer]: Strengthening, Both, 10 reps, 5 reps ?Knee Flexion: Strengthening, Both, 10 reps, 5 reps ?Other Exercises ?Other Exercises: Mini squats x 4 ?Other Exercises: consecutive  sit to/from stand with slow eccentric phase x 3 ? ?  ?General Comments   ?  ?  ? ?Pertinent Vitals/Pain Pain Assessment ?Pain Assessment: No/denies pain  ? ? ?Home Living   ?  ?  ?  ?  ?  ?  ?  ?  ?  ?   ?  ?Prior Function    ?  ?  ?   ? ?PT Goals (current goals can now be found in the care plan section) Progress towards PT goals: Progressing toward goals ? ?  ?Frequency ? ? ? Min 2X/week ? ? ? ?  ?PT Plan Current plan remains appropriate  ? ? ?Co-evaluation   ?  ?  ?  ?  ? ?  ?AM-PAC PT "6 Clicks" Mobility   ?Outcome Measure ? Help needed turning from your back to your side while in a flat bed without using bedrails?: None ?Help needed moving from lying on your back to sitting on the side of a flat bed without using bedrails?: None ?Help needed moving to and from a bed to a chair (including a wheelchair)?: A Little ?Help needed standing up from a chair using your arms (e.g., wheelchair or bedside chair)?: A Little ?Help needed to walk in hospital room?: A Little ?Help needed climbing 3-5 steps with a railing? : A Little ?6 Click Score: 20 ? ?  ?End of Session Equipment Utilized During Treatment: Gait belt ?Activity Tolerance: Patient tolerated treatment well ?Patient left: in chair;with call bell/phone within reach ?Nurse Communication: Mobility status ?PT Visit Diagnosis: Muscle weakness (generalized) (M62.81);Difficulty in walking, not elsewhere classified (R26.2) ?  ? ? ?Time: 4098-1191 ?PT Time Calculation (min) (ACUTE ONLY): 24 min ? ?Charges:  $Gait Training: 8-22 mins ?$Therapeutic Exercise: 8-22 mins          ?          ? ?D. Elly Modena PT, DPT ?08/01/21, 11:20 AM ? ? ?

## 2021-08-01 NOTE — Progress Notes (Signed)
? ? ? ?Progress Note  ? ? ?Elizabeth Novak  WNU:[id number] DOB: [birth year]  DOA: 07/25/2021 ?PCP: [facility] Browner [facility]  ? ? ? ? ?Brief Narrative:  ? ? ?Medical records reviewed and are as summarized below: ? ? ? ?[name] is a 88 year old female with sick sinus syndrome, hypertension, hyperlipidemia, insulin-dependent diabetes mellitus, depression, dementia, GERD, chronic constipation, who presents emergency department for chief concerns of sudden onset of right upper extremity pain. ? ? ?Initial vitals in the emergency department showed temperature of 97.5, respiration rate of 18, heart rate 64, blood pressure 140/67, SPO2 99% on room air. ? ?Serum sodium is 141, potassium 4.1, chloride of 98, bicarb 32, BUN of 30, serum creatinine 1.03, GFR greater than 50, nonfasting blood glucose 246, WBC 4.9, hemoglobin 12.3, platelets of 245. ? ?CTA of the right upper extremity with and without contrast: Was read as abrupt cut off of the distal right radial artery with some reconstitution of the forearm vessels as above.  Concerning for possible embolus.  Area of decreased perfusion of the lower pole of the right kidney.  Cannot exclude embolic involvement and infarct.  Cholelithiasis. ? ?CT of the head without contrast read as no evidence of intracranial abnormality.  Small vessel ischemic changes. ? ? ? ?She was admitted to the hospital for acute occlusion of right upper extremity artery due to thrombus.  She was treated with IV heparin drip and transition to Eliquis. ? ? ? ? ? ?Assessment/Plan:  ? ?Principal Problem: ?  Acute occlusion of artery of upper extremity due to thrombus [facility]) ?Active Problems: ?  Atrial fibrillation (HCC) ?  Sick sinus syndrome (HCC) ?  GERD without esophagitis ?  Chronic constipation ?  Dyslipidemia associated with type 2 diabetes mellitus (HCC) ?  Anxiety with depression ?  Congestive heart failure due to valvular disease (HCC) ?  S/P placement of cardiac  pacemaker ?  HTN (hypertension) ?  HLD (hyperlipidemia) ?  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 ? ? ?Body mass index is 17.75 kg/m?. ? ? ? ?  ?Acute occlusion of artery of upper extremity due to thrombus [facility]) ?-Seen by vascular surgeon, not a good surgical candidate.  Continue Eliquis ? ?Acute exacerbation of chronic diastolic CHF, severe MR, severe TR ?Continue IV Lasix and monitor BMP, urine output and daily weight.  2D echo on 07/26/2021 showed EF estimated at 65 to 70%, grade 3 diastolic dysfunction, severely dilated left and right atria, severe mitral regurgitation, severe tricuspid regurgitation ? ?Severe constipation, fecal impaction ?- Improved.  Continue laxatives.   ?  ?Left upper extremity numbness and discomfort ?This has been going on for several weeks.  Unable to obtain MRI here due to her pacemaker and she is allergic to contrast for CT scan.  This can be done outpatient at [facility] or any place that can take care of her pacemaker ? ?  ?Renal infarct, right side ?- Seen on CT, continue Eliquis ? ?Type II DM with hyperglycemia  ?She takes Lantus 9 units twice daily at home.  Continue insulin glargine at 10 units nightly.  Adjust insulin therapy based on glucose levels. ? ? ?Hypokalemia ?Replete potassium and monitor levels ?  ?Other comorbidities include hypertension, anxiety, depression, GERD, sick sinus syndrome, paroxysmal atrial fibrillation ? ? ? ?Diet Order   ? ?       ?  Diet regular Room service appropriate? Yes; Fluid consistency: Thin  Diet effective now       ?  ? ?  ?  ? ?  ? ? ? ? ? ? ? ? ?  Consultants: ?None ? ?Procedures: ?None ? ? ? ?Medications:  ? ? apixaban  10 mg Oral BID  ? Followed by  ? [START ON 08/04/2021] apixaban  5 mg Oral BID  ? cholecalciferol  2,000 Units Oral Daily  ? digoxin  0.125 mg Oral Daily  ? famotidine  20 mg Oral QHS  ? furosemide  40 mg Intravenous BID  ? insulin aspart  0-6 Units Subcutaneous TID WC  ? insulin glargine-yfgn  10 Units  Subcutaneous QHS  ? isosorbide mononitrate  30 mg Oral Daily  ? mirtazapine  15 mg Oral QHS  ? pantoprazole  40 mg Oral Daily  ? polyethylene glycol  17 g Oral BID  ? senna-docusate  2 tablet Oral BID  ? ?Continuous Infusions: ? ? ?Anti-infectives (From admission, onward)  ? ? None  ? ?  ? ? ? ? ? ? ? ? ? ?Family Communication/Anticipated D/C date and plan/Code Status  ? ?DVT prophylaxis: Place and maintain sequential compression device Start: 07/28/21 1120 ?Place TED hose Start: 07/25/21 2215 ?apixaban (ELIQUIS) tablet 10 mg  ?apixaban (ELIQUIS) tablet 5 mg  ? ?  Code Status: DNR ? ?Family Communication: None ?Disposition Plan: Plan to discharge home tomorrow ? ? ?Status is: Inpatient ?Remains inpatient appropriate because: Hypervolemia requiring IV Lasix ? ? ? ? ? ? ?Subjective:  ? ?Interval events noted.  No new complaints.  No shortness of breath.  She is feeling a little better.  Her legs are still swollen but swelling is improving ? ?Objective:  ? ? ?Vitals:  ? 08/01/21 0325 08/01/21 0500 08/01/21 0842 08/01/21 1216  ?BP: (!) 144/58  (!) 142/85 (!) 120/56  ?Pulse: 72  85 76  ?Resp: 20  18 16   ?Temp: 97.9 ?F (36.6 ?C)  98.2 ?F (36.8 ?C) 98 ?F (36.7 ?C)  ?TempSrc:      ?SpO2: 96%  98% 100%  ?Weight:  46.9 kg    ?Height:      ? ?No data found. ? ? ?Intake/Output Summary (Last 24 hours) at 08/01/2021 1513 ?Last data filed at 08/01/2021 1349 ?Gross per 24 hour  ?Intake 480 ml  ?Output --  ?Net 480 ml  ? ?Filed Weights  ? 07/25/21 1855 08/01/21 0500  ?Weight: 50 kg 46.9 kg  ? ? ?Exam: ? ?GEN: NAD ?SKIN: Warm and dry ?EYES: EOMI ?ENT: MMM ?CV: RRR, systolic murmur loudest in the mitral area ?PULM: CTA B ?ABD: soft, ND, NT, +BS ?CNS: AAO x 3, non focal ?EXT: Bilateral leg and pedal edema is improving ? ? ? ? ? ?  ? ? ?Data Reviewed:  ? ?I have personally reviewed following labs and imaging studies: ? ?Labs: ?Labs show the following:  ? ?Basic Metabolic Panel: ?Recent Labs  ?Lab 07/27/21 ?0431 07/28/21 ?4098  07/29/21 ?0421 07/30/21 ?1191 07/31/21 ?0602 08/01/21 ?4782  ?NA 141 141 137 138 139 138  ?K 3.9 4.4 3.9 3.5 3.8 3.2*  ?CL 102 104 103 105 105 105  ?CO2 33* 30 29 28 28 27   ?GLUCOSE 165* 205* 188* 198* 105* 109*  ?BUN 24* 20 19 20 20  24*  ?CREATININE 0.91 0.83 0.91 0.88 0.85 0.84  ?CALCIUM 8.9 9.2 9.2 8.9 8.8* 8.8*  ?MG 2.1 2.2 2.1 2.0 1.9 2.2  ?PHOS 2.7  --   --   --   --   --   ? ?GFR ?Estimated Creatinine Clearance: 29.7 mL/min (by C-G formula based on SCr of 0.84 mg/dL). ?Liver Function Tests: ?Recent Labs  ?Lab 07/27/21 ?  7829  ?AST 27  ?ALT 23  ?ALKPHOS 58  ?BILITOT 0.7  ?PROT 5.5*  ?ALBUMIN 2.8*  ? ?No results for input(s): LIPASE, AMYLASE in the last 168 hours. ?No results for input(s): AMMONIA in the last 168 hours. ?Coagulation profile ?No results for input(s): INR, PROTIME in the last 168 hours. ? ?CBC: ?Recent Labs  ?Lab 07/25/21 ?1934 07/26/21 ?5621 07/27/21 ?0431 07/28/21 ?3086 07/29/21 ?0421 07/30/21 ?5784 07/31/21 ?0602 08/01/21 ?6962  ?WBC 4.9   < > 4.5 4.1 3.9* 4.2 4.8 4.5  ?NEUTROABS 3.3  --  2.4  --   --   --   --   --   ?HGB 12.3   < > 10.4* 11.1* 10.6* 10.9* 11.4* 10.6*  ?HCT 39.9   < > 33.6* 34.5* 33.9* 34.3* 35.6* 33.1*  ?MCV 89.1   < > 85.9 85.6 85.8 84.9 85.2 84.4  ?PLT 245   < > 213 210 208 211 222 198  ? < > = values in this interval not displayed.  ? ?Cardiac Enzymes: ?No results for input(s): CKTOTAL, CKMB, CKMBINDEX, TROPONINI in the last 168 hours. ?BNP (last 3 results) ?No results for input(s): PROBNP in the last 8760 hours. ?CBG: ?Recent Labs  ?Lab 07/31/21 ?1209 07/31/21 ?1639 07/31/21 ?2032 08/01/21 ?9528 08/01/21 ?1218  ?GLUCAP 171* 173* 213* 121* 178*  ? ?D-Dimer: ?No results for input(s): DDIMER in the last 72 hours. ?Hgb A1c: ?No results for input(s): HGBA1C in the last 72 hours. ?Lipid Profile: ?No results for input(s): CHOL, HDL, LDLCALC, TRIG, CHOLHDL, LDLDIRECT in the last 72 hours. ?Thyroid function studies: ?No results for input(s): TSH, T4TOTAL, T3FREE, THYROIDAB in the  last 72 hours. ? ?Invalid input(s): FREET3 ?Anemia work up: ?No results for input(s): VITAMINB12, FOLATE, FERRITIN, TIBC, IRON, RETICCTPCT in the last 72 hours. ?Sepsis Labs: ?Recent Labs  ?Lab 07/29/21 ?0421 07/30/21 ?051

## 2021-08-02 DIAGNOSIS — K5909 Other constipation: Secondary | ICD-10-CM

## 2021-08-02 DIAGNOSIS — I749 Embolism and thrombosis of unspecified artery: Secondary | ICD-10-CM

## 2021-08-02 DIAGNOSIS — I742 Embolism and thrombosis of arteries of the upper extremities: Secondary | ICD-10-CM | POA: Diagnosis not present

## 2021-08-02 LAB — CBC
HCT: 34.9 % — ABNORMAL LOW (ref 36.0–46.0)
Hemoglobin: 11.1 g/dL — ABNORMAL LOW (ref 12.0–15.0)
MCH: 27.3 pg (ref 26.0–34.0)
MCHC: 31.8 g/dL (ref 30.0–36.0)
MCV: 86 fL (ref 80.0–100.0)
Platelets: 213 10*3/uL (ref 150–400)
RBC: 4.06 MIL/uL (ref 3.87–5.11)
RDW: 14 % (ref 11.5–15.5)
WBC: 4.3 10*3/uL (ref 4.0–10.5)
nRBC: 0 % (ref 0.0–0.2)

## 2021-08-02 LAB — MAGNESIUM: Magnesium: 2.2 mg/dL (ref 1.7–2.4)

## 2021-08-02 LAB — BASIC METABOLIC PANEL
Anion gap: 5 (ref 5–15)
Anion gap: 8 (ref 5–15)
BUN: 24 mg/dL — ABNORMAL HIGH (ref 8–23)
BUN: 25 mg/dL — ABNORMAL HIGH (ref 8–23)
CO2: 28 mmol/L (ref 22–32)
CO2: 28 mmol/L (ref 22–32)
Calcium: 9.3 mg/dL (ref 8.9–10.3)
Calcium: 9.5 mg/dL (ref 8.9–10.3)
Chloride: 104 mmol/L (ref 98–111)
Chloride: 107 mmol/L (ref 98–111)
Creatinine, Ser: 0.87 mg/dL (ref 0.44–1.00)
Creatinine, Ser: 0.92 mg/dL (ref 0.44–1.00)
GFR, Estimated: 57 mL/min — ABNORMAL LOW (ref >60)
GFR, Estimated: >60 mL/min (ref >60)
Glucose, Bld: 126 mg/dL — ABNORMAL HIGH (ref 70–99)
Glucose, Bld: 181 mg/dL — ABNORMAL HIGH (ref 70–99)
Potassium: 4.1 mmol/L (ref 3.5–5.1)
Potassium: 4.3 mmol/L (ref 3.5–5.1)
Sodium: 140 mmol/L (ref 135–145)
Sodium: 140 mmol/L (ref 135–145)

## 2021-08-02 LAB — GLUCOSE, CAPILLARY
Glucose-Capillary: 95 mg/dL (ref 70–99)
Glucose-Capillary: 283 mg/dL — ABNORMAL HIGH (ref 70–99)

## 2021-08-02 LAB — TSH: TSH: 11.411 u[IU]/mL — ABNORMAL HIGH (ref 0.350–4.500)

## 2021-08-02 LAB — VITAMIN D 25 HYDROXY (VIT D DEFICIENCY, FRACTURES): Vit D, 25-Hydroxy: 19.92 ng/mL — ABNORMAL LOW (ref 30–100)

## 2021-08-02 MED ORDER — APIXABAN 5 MG PO TABS: 5.0000 mg | ORAL_TABLET | Freq: Two times a day (BID) | ORAL | 0 refills | Status: DC

## 2021-08-02 MED ORDER — INSULIN GLARGINE-YFGN 100 UNIT/ML ~~LOC~~ SOLN: 9.0000 [IU] | Freq: Two times a day (BID) | SUBCUTANEOUS | Status: DC

## 2021-08-02 MED ORDER — APIXABAN 5 MG PO TABS: 10.0000 mg | ORAL_TABLET | Freq: Two times a day (BID) | ORAL | 0 refills | Status: DC

## 2021-08-02 MED ORDER — DIGOXIN 125 MCG PO TABS: 0.1250 mg | ORAL_TABLET | Freq: Every day | ORAL | Status: AC

## 2021-08-02 NOTE — TOC Transition Note (Signed)
Transition of Care (TOC) - CM/SW Discharge Note ? ? ?Patient Details  ?Name: Elizabeth Novak ?MRN: [id number] ?Date of Birth: 1924/04/03 ? ?Transition of Care (TOC) CM/SW Contact:  ?Izola Price, RN ?Phone Number: ?08/02/2021, 3:33 PM ? ? ?Clinical Narrative: 4/29: Patient being discharged today and transported by [facility]  due to no family to [facility] [id number]. Confirmed and updated Monica at facility. Faxed DC Summary and inboxed DC Summary and DC Orders to [phone]. Active with [facility] and notified via Emilie Rutter for PT/OT. No DME orders. [facility] confirmed for transport. Forms printed to unit for EMS. No report call needed as RN CM updated staff. Simmie Davies RN CM  ? ? ? ?Final next level of care: Assisted Living ?Barriers to Discharge: Barriers Resolved ? ? ?Patient Goals and CMS Choice ?  ?  ?  ? ?Discharge Placement ?  ?           ?  ?Patient to be transferred to facility by: [facility] ?Name of family member notified: No family ?Patient and family notified of of transfer: 08/02/21 ? ?Discharge Plan and Services ?  ?  ?           ?DME Arranged: N/A ?DME Agency: NA ?  ?  ?  ?HH Arranged: OT, PT (Current with [facility] at [facility]. [facility] notified of DC.) ?[place] Agency: [place] ?Date HH Agency Contacted: 08/02/21 ?Time [place]: 8768 ?Representative spoke with at [place]: Emilie Rutter ? ?Social Determinants of Health (SDOH) Interventions ?  ? ? ?Readmission Risk Interventions ?   ? View : No data to display.  ?  ?  ?  ? ? ? ? ? ?

## 2021-08-02 NOTE — Progress Notes (Signed)
Assumed care of pt at 1900. A&O x4. RA. At 2200, pt reporting to this RN new onset of numbness in her left hand and forearm. She states "it feels like pins and needles. I would do something with it but my hand just feels dead." Pulses strong/equal. No redness/swelling noted. Provider made aware, see new orders.  ? ?Medication administration per MAR. Full assessment per flowsheets. Call bell within reach, pt ringing appropriately.  ?

## 2021-08-02 NOTE — Discharge Summary (Signed)
?Physician Discharge Summary ?  ?Patient: Elizabeth Novak MRN: [id number] DOB: 1923-12-23  ?Admit date:     07/25/2021  ?Discharge date: 08/02/21  ?Discharge Physician: Lurene Shadow  ? ?PCP: Diona Browner [facility]  ? ?Recommendations at discharge:  ? ? ?Follow-up with PCP in 1 week ? ?Discharge Diagnoses: ?Principal Problem: ?  Acute occlusion of artery of upper extremity due to thrombus (HCC) ?Active Problems: ?  Atrial fibrillation (HCC) ?  Sick sinus syndrome (HCC) ?  GERD without esophagitis ?  Chronic constipation ?  Dyslipidemia associated with type 2 diabetes mellitus (HCC) ?  Anxiety with depression ?  Congestive heart failure due to valvular disease (HCC) ?  S/P placement of cardiac pacemaker ?  HTN (hypertension) ?  HLD (hyperlipidemia) ?  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 ?Resolved Problems: ?  * No resolved hospital problems. * ? ?Hospital Course: ? ?[name] is a 88 year old female with sick sinus syndrome, hypertension, hyperlipidemia, insulin-dependent diabetes mellitus, depression, dementia, GERD, chronic constipation, who presents emergency department for chief concerns of sudden onset of right upper extremity pain. ?  ?  ?Initial vitals in the emergency department showed temperature of 97.5, respiration rate of 18, heart rate 64, blood pressure 140/67, SPO2 99% on room air. ? ?Serum sodium is 141, potassium 4.1, chloride of 98, bicarb 32, BUN of 30, serum creatinine 1.03, GFR greater than 50, nonfasting blood glucose 246, WBC 4.9, hemoglobin 12.3, platelets of 245. ?  ?CTA of the right upper extremity with and without contrast: Was read as abrupt cut off of the distal right radial artery with some reconstitution of the forearm vessels as above.  Concerning for possible embolus.  Area of decreased perfusion of the lower pole of the right kidney.  Cannot exclude embolic involvement and infarct.  Cholelithiasis. ?  ?CT of the head without contrast read  as no evidence of intracranial abnormality.  Small vessel ischemic changes. ? ?  ?  ?She was admitted to the hospital for acute occlusion of right upper extremity artery due to thrombus.  She was treated with IV heparin drip and transitioned to Eliquis. ? ?She was also treated with IV Lasix for acute on chronic diastolic CHF.  Her condition has improved and she is deemed stable for discharge today. ? ? ?  ? ? ?Consultants: Vascular surgeon ?Procedures performed: None ?Disposition: Assisted living ?Diet recommendation:  ?Discharge Diet Orders (From admission, onward)  ? ?  Start     Ordered  ? 08/02/21 0000  Diet - low sodium heart healthy       ? 08/02/21 0931  ? ?  ?  ? ?  ? ?Cardiac diet ?DISCHARGE MEDICATION: ?Allergies as of 08/02/2021   ? ?   Reactions  ? Codeine Nausea Only  ? Disopyramide   ? Other reaction(s): Unknown  ? Ibuprofen Diarrhea  ? Iodine   ? blisters  ? Metformin And Related Other (See Comments)  ? unknown  ? Norpace [disopyramide Phosphate]   ? Quinidine   ? Other reaction(s): Unknown  ? Terfenadine   ? Other reaction(s): Unknown  ? Topiramate   ? Other reaction(s): Other (See Comments) ?Hair loss  ? Verapamil   ? Other reaction(s): Unknown  ? Dexamethasone Sodium Phosphate Palpitations  ? ?  ? ?  ?Medication List  ?  ? ?TAKE these medications   ? ?acetaminophen 325 MG tablet ?Commonly known as: TYLENOL ?Take 2 tablets (650 mg total) by mouth every 6 (six)  hours as needed for mild pain. ?  ?apixaban 5 MG Tabs tablet ?Commonly known as: ELIQUIS ?Take 2 tablets (10 mg total) by mouth 2 (two) times daily for 2 days. ?  ?apixaban 5 MG Tabs tablet ?Commonly known as: ELIQUIS ?Take 1 tablet (5 mg total) by mouth 2 (two) times daily. ?Start taking on: Aug 04, 2021 ?  ?cholecalciferol 25 MCG (1000 UNIT) tablet ?Commonly known as: VITAMIN D3 ?Take 2,000 Units by mouth daily. ?  ?digoxin 0.125 MG tablet ?Commonly known as: LANOXIN ?Take 1 tablet (0.125 mg total) by mouth daily. ?  ?famotidine 20 MG  tablet ?Commonly known as: PEPCID ?Take 20 mg by mouth at bedtime. ?  ?glipiZIDE 10 MG tablet ?Commonly known as: GLUCOTROL ?Take 10 mg by mouth daily. ?  ?insulin glargine-yfgn 100 UNIT/ML injection ?Commonly known as: SEMGLEE ?Inject 0.09 mLs (9 Units total) into the skin 2 (two) times daily. ?  ?isosorbide mononitrate 30 MG 24 hr tablet ?Commonly known as: IMDUR ?Take 30 mg by mouth daily. ?  ?metolazone 2.5 MG tablet ?Commonly known as: ZAROXOLYN ?Take 1 tablet (2.5 mg total) by mouth daily as needed. Home med. ?  ?metoprolol succinate 50 MG 24 hr tablet ?Commonly known as: TOPROL-XL ?Take 1 tablet (50 mg total) by mouth daily. ?  ?mirtazapine 15 MG tablet ?Commonly known as: REMERON ?Take 15 mg by mouth at bedtime. ?  ?nitroGLYCERIN 0.4 MG SL tablet ?Commonly known as: NITROSTAT ?Place 0.4 mg under the tongue every 5 (five) minutes as needed for chest pain. ?  ?omeprazole 20 MG capsule ?Commonly known as: PRILOSEC ?Take 20 mg by mouth 2 (two) times daily before a meal. ?  ?polyethylene glycol 17 g packet ?Commonly known as: MIRALAX / GLYCOLAX ?Take 17 g by mouth daily. ?  ?potassium chloride SA 20 MEQ tablet ?Commonly known as: KLOR-CON M ?Take 1 tablet (20 mEq total) by mouth daily. ?  ?Systane 0.4-0.3 % Soln ?Generic drug: Polyethyl Glycol-Propyl Glycol ?Place 1 drop into both eyes as needed (dry eyes). ?  ?torsemide 20 MG tablet ?Commonly known as: DEMADEX ?Take 20 mg by mouth daily. ?  ? ?  ? ? ?Discharge Exam: ?Filed Weights  ? 07/25/21 1855 08/01/21 0500 08/02/21 0451  ?Weight: 50 kg 46.9 kg 46.6 kg  ? ?GEN: NAD ?SKIN: No rash ?EYES: EOMI ?ENT: MMM ?CV: RRR ?PULM: CTA B ?ABD: soft, ND, NT, +BS ?CNS: AAO x 3, non focal ?EXT: Mild b/l leg edema, no tenderness ? ? ?Condition at discharge: good ? ?The results of significant diagnostics from this hospitalization (including imaging, microbiology, ancillary and laboratory) are listed below for reference.  ? ?Imaging Studies: ?DG Abd 1 View ? ?Result Date:  07/27/2021 ?CLINICAL DATA:  Abdominal distension EXAM: ABDOMEN - 1 VIEW COMPARISON:  None. FINDINGS: Nonobstructive pattern of bowel gas. No obvious free air. Moderate burden of stool in the distal colon. Gallstone in the right upper quadrant. IMPRESSION: 1. Nonobstructive pattern of bowel gas. No obvious free air. Moderate burden of stool in the distal colon. 2.  Cholelithiasis. Electronically Signed   By: Jearld Lesch M.D.   On: 07/27/2021 12:37  ? ?CT HEAD WO CONTRAST ( ) ? ?Result Date: 08/02/2021 ?CLINICAL DATA:  Left arm numbness EXAM: CT HEAD WITHOUT CONTRAST TECHNIQUE: Contiguous axial images were obtained from the base of the skull through the vertex without intravenous contrast. RADIATION DOSE REDUCTION: This exam was performed according to the departmental dose-optimization program which includes automated exposure control, adjustment of the mA and/or  kV according to patient size and/or use of iterative reconstruction technique. COMPARISON:  07/25/2021 FINDINGS: Brain: No evidence of acute infarction, hemorrhage, cerebral edema, mass, mass effect, or midline shift. No hydrocephalus or extra-axial fluid collection. Periventricular white matter changes, likely the sequela of chronic small vessel ischemic disease. Vascular: No hyperdense vessel. Skull: Normal. Negative for fracture or focal lesion. Sinuses/Orbits: No acute finding. Status post bilateral lens replacements. Other: The mastoid air cells are well aerated. IMPRESSION: IMPRESSION No acute intracranial process. Electronically Signed   By: Wiliam Ke M.D.   On: 08/02/2021 00:10  ? ?CT Head Wo Contrast ? ?Result Date: 07/25/2021 ?CLINICAL DATA:  Right arm numbness EXAM: CT HEAD WITHOUT CONTRAST TECHNIQUE: Contiguous axial images were obtained from the base of the skull through the vertex without intravenous contrast. RADIATION DOSE REDUCTION: This exam was performed according to the departmental dose-optimization program which includes automated  exposure control, adjustment of the mA and/or kV according to patient size and/or use of iterative reconstruction technique. COMPARISON:  04/25/2021 FINDINGS: Brain: No evidence of acute infarction, hemorrhage, hyd

## 2021-08-12 ENCOUNTER — Encounter: Payer: Self-pay | Admitting: Emergency Medicine

## 2021-08-12 ENCOUNTER — Emergency Department
Admission: EM | Admit: 2021-08-12 | Discharge: 2021-08-12 | Disposition: A | Discharge status: Home / Self Care | Payer: Medicare Other | Attending: Emergency Medicine | Admitting: Emergency Medicine

## 2021-08-12 ENCOUNTER — Emergency Department: Payer: Medicare Other

## 2021-08-12 ENCOUNTER — Other Ambulatory Visit: Payer: Self-pay

## 2021-08-12 DIAGNOSIS — R7989 Other specified abnormal findings of blood chemistry: Secondary | ICD-10-CM | POA: Diagnosis not present

## 2021-08-12 DIAGNOSIS — R600 Localized edema: Secondary | ICD-10-CM | POA: Insufficient documentation

## 2021-08-12 DIAGNOSIS — M7989 Other specified soft tissue disorders: Secondary | ICD-10-CM | POA: Insufficient documentation

## 2021-08-12 LAB — CBC WITH DIFFERENTIAL/PLATELET
Abs Immature Granulocytes: 0.02 10*3/uL (ref 0.00–0.07)
Basophils Absolute: 0 10*3/uL (ref 0.0–0.1)
Basophils Relative: 1 %
Eosinophils Absolute: 0.1 10*3/uL (ref 0.0–0.5)
Eosinophils Relative: 2 %
HCT: 35.4 % — ABNORMAL LOW (ref 36.0–46.0)
Hemoglobin: 11.3 g/dL — ABNORMAL LOW (ref 12.0–15.0)
Immature Granulocytes: 0 %
Lymphocytes Relative: 17 %
Lymphs Abs: 1 10*3/uL (ref 0.7–4.0)
MCH: 27.1 pg (ref 26.0–34.0)
MCHC: 31.9 g/dL (ref 30.0–36.0)
MCV: 84.9 fL (ref 80.0–100.0)
Monocytes Absolute: 0.5 10*3/uL (ref 0.1–1.0)
Monocytes Relative: 9 %
Neutro Abs: 4.3 10*3/uL (ref 1.7–7.7)
Neutrophils Relative %: 71 %
Platelets: 253 10*3/uL (ref 150–400)
RBC: 4.17 MIL/uL (ref 3.87–5.11)
RDW: 14.4 % (ref 11.5–15.5)
WBC: 6 10*3/uL (ref 4.0–10.5)
nRBC: 0 % (ref 0.0–0.2)

## 2021-08-12 LAB — URINALYSIS, ROUTINE W REFLEX MICROSCOPIC
Bilirubin Urine: NEGATIVE
Glucose, UA: NEGATIVE mg/dL
Hgb urine dipstick: NEGATIVE
Ketones, ur: NEGATIVE mg/dL
Leukocytes,Ua: NEGATIVE
Nitrite: NEGATIVE
Protein, ur: NEGATIVE mg/dL
Specific Gravity, Urine: 1.008 (ref 1.005–1.030)
pH: 6 (ref 5.0–8.0)

## 2021-08-12 LAB — BASIC METABOLIC PANEL
Anion gap: 7 (ref 5–15)
BUN: 23 mg/dL (ref 8–23)
CO2: 25 mmol/L (ref 22–32)
Calcium: 9 mg/dL (ref 8.9–10.3)
Chloride: 107 mmol/L (ref 98–111)
Creatinine, Ser: 0.97 mg/dL (ref 0.44–1.00)
GFR, Estimated: 54 mL/min — ABNORMAL LOW (ref >60)
Glucose, Bld: 149 mg/dL — ABNORMAL HIGH (ref 70–99)
Potassium: 3.6 mmol/L (ref 3.5–5.1)
Sodium: 139 mmol/L (ref 135–145)

## 2021-08-12 LAB — HEPATIC FUNCTION PANEL
ALT: 16 U/L (ref 0–44)
AST: 26 U/L (ref 15–41)
Albumin: 3.2 g/dL — ABNORMAL LOW (ref 3.5–5.0)
Alkaline Phosphatase: 61 U/L (ref 38–126)
Bilirubin, Direct: <0.1 mg/dL (ref 0.0–0.2)
Total Bilirubin: 0.5 mg/dL (ref 0.3–1.2)
Total Protein: 5.9 g/dL — ABNORMAL LOW (ref 6.5–8.1)

## 2021-08-12 LAB — TROPONIN I (HIGH SENSITIVITY): Troponin I (High Sensitivity): 8 ng/L (ref <18)

## 2021-08-12 LAB — BRAIN NATRIURETIC PEPTIDE: B Natriuretic Peptide: 197 pg/mL — ABNORMAL HIGH (ref 0.0–100.0)

## 2021-08-12 MED ORDER — TORSEMIDE 20 MG PO TABS
20.0000 mg | ORAL_TABLET | Freq: Every day | ORAL | Status: DC
  Administered 2021-08-12: 20 mg via ORAL
  Filled 2021-08-12: qty 1

## 2021-08-12 NOTE — ED Provider Notes (Signed)
? ?[facility] ?Provider Note ? ? ? Event Date/Time  ? First MD Initiated Contact with Patient 08/12/21 1556   ?  (approximate) ? ? ?History  ? ?Leg Swelling ? ? ?HPI ? ?Elizabeth Novak is a 88 y.o. female  who, per discharge summary dated 07/03/21 has history of heart failure, who presents to the emergency department today because of concern for extremity swelling and fluid overload.  The patient states that it has been worse over the past couple of days.  She denies any shortness of breath.  She says she is on a fluid pill at home.  Patient denies any pain in her extremities save for some right knee pain when she walks. ? ?Physical Exam  ? ?Triage Vital Signs: ?ED Triage Vitals  ?Enc Vitals Group  ?   BP 08/12/21 1238 (!) 151/69  ?   Pulse Rate 08/12/21 1238 74  ?   Resp 08/12/21 1238 17  ?   Temp 08/12/21 1238 98 ?F (36.7 ?C)  ?   Temp src --   ?   SpO2 08/12/21 1238 99 %  ?   Weight --   ?   Height 08/12/21 1243 '5\' 4"'$  (1.626 m)  ?   Head Circumference --   ?   Peak Flow --   ?   Pain Score 08/12/21 1243 0  ? ?Most recent vital signs: ?Vitals:  ? 08/12/21 1238  ?BP: (!) 151/69  ?Pulse: 74  ?Resp: 17  ?Temp: 98 ?F (36.7 ?C)  ?SpO2: 99%  ? ? ?General: Awake, alert, oriented. ?CV:  Good peripheral perfusion. Irregular rhythm.  ?Resp:  Normal effort. Lungs clear to auscultation. ?Abd:  No distention.  ?MSK:  Peripheral pitting edema, worse in legs and left upper arm.  ? ? ?ED Results / Procedures / Treatments  ? ?Labs ?(all labs ordered are listed, but only abnormal results are displayed) ?Labs Reviewed  ?BRAIN NATRIURETIC PEPTIDE - Abnormal; Notable for the following components:  ?    Result Value  ? B Natriuretic Peptide 197.0 (*)   ? All other components within normal limits  ?BASIC METABOLIC PANEL - Abnormal; Notable for the following components:  ? Glucose, Bld 149 (*)   ? GFR, Estimated 54 (*)   ? All other components within normal limits  ?CBC WITH DIFFERENTIAL/PLATELET - Abnormal;  Notable for the following components:  ? Hemoglobin 11.3 (*)   ? HCT 35.4 (*)   ? All other components within normal limits  ?HEPATIC FUNCTION PANEL - Abnormal; Notable for the following components:  ? Total Protein 5.9 (*)   ? Albumin 3.2 (*)   ? All other components within normal limits  ?URINALYSIS, ROUTINE W REFLEX MICROSCOPIC  ?TROPONIN I (HIGH SENSITIVITY)  ?TROPONIN I (HIGH SENSITIVITY)  ? ? ? ?EKG ? ?INance Pear, attending physician, personally viewed and interpreted this EKG ? ?EKG Time: 1244 ?Rate: 69 ?Rhythm: atrial fibrillation with ventricular paced complexes ?Axis: normal ?Intervals: qtc 383 ?QRS: incomplete RBBB, q waves v1, v2 ?ST changes: no st elevation ?Impression: abnormal ekg ? ? ? ?RADIOLOGY ?I independently interpreted and visualized the CXR. My interpretation: Cardiomegaly. No pneumothorax, no pneumonia.  ?Radiology interpretation:  ?IMPRESSION:  ?Small right and possible trace left pleural effusions with unchanged  ?cardiomegaly. No evidence of edema.  ? ?PROCEDURES: ? ?Critical Care performed: No ? ?Procedures ? ? ?MEDICATIONS ORDERED IN ED: ?Medications - No data to display ? ? ?IMPRESSION / MDM / ASSESSMENT AND PLAN /  ED COURSE  ?I reviewed the triage vital signs and the nursing notes. ?             ?               ? ?Differential diagnosis includes, but is not limited to, CHF, kidney failure, hypoalbuminemia, liver failure. ? ?Patient presents to the emergency department today with concern for extremity swelling.  On exam she does have swelling primarily to her lower legs and left upper extremity.  It is pitting.  She denied any shortness of breath to myself.  Patient in no respiratory distress.  X-ray without any overt pulmonary edema.  BNP is slightly elevated.  Albumin is slightly low. At this time I do think she is suffering from likely fluid overload.  However given the patient is in no respiratory distress and denies any shortness of breath myself I do not feel she  necessitates inpatient admission at this time.  Given staff difficulty with obtaining IV will give patient extra dose of torsemide at this time.  Will instruct patient to take double her torsemide over the next couple days and will give CHF clinic follow-up information. ? ? ?FINAL CLINICAL IMPRESSION(S) / ED DIAGNOSES  ? ?Final diagnoses:  ?Leg swelling  ? ? ? ? ?Note:  This document was prepared using Dragon voice recognition software and may include unintentional dictation errors. ? ?  ?Nance Pear, MD ?08/12/21 1640 ? ?

## 2021-08-12 NOTE — ED Notes (Signed)
Pt comes via [facility] from [facility] home with bilateral leg swelling. 3+ pitting edema. VSS. Pt denies any pain. ?

## 2021-08-12 NOTE — ED Triage Notes (Addendum)
Pt presents via [facility] with c/o bilateral leg swelling. Pt reports left arm swelling as well. Pt has hx of congestive heart failure.Pt alert and oriented x4. From [employer]. Pt denies CP, N/V/D. Pt endorses "occasional shob" ?

## 2021-08-12 NOTE — ED Notes (Signed)
[facility] CALLED  FOR TRANSPORT  TO  [facility] ?

## 2021-08-12 NOTE — ED Provider Triage Note (Signed)
?  Emergency Medicine Provider Triage Evaluation Note ? ?Elizabeth Novak , a 88 y.o.female,  was evaluated in triage.  Pt complains of leg swelling for the past week.  Patient has a history of congestive heart failure.  No shortness of breath at this time ? ? ?Review of Systems  ?Positive: Leg swelling ?Negative: Denies fever, chest pain, vomiting ? ?Physical Exam  ?There were no vitals filed for this visit. ?Gen:   Awake, no distress   ?Resp:  Normal effort  ?MSK:   Moves extremities without difficulty  ?Other:  2+ pitting edema in the lower extremities extending up beyond the knees. ? ?Medical Decision Making  ?Given the patient's initial medical screening exam, the following diagnostic evaluation has been ordered. The patient will be placed in the appropriate treatment space, once one is available, to complete the evaluation and treatment. I have discussed the plan of care with the patient and I have advised the patient that an ED physician or mid-level practitioner will reevaluate their condition after the test results have been received, as the results may give them additional insight into the type of treatment they may need.  ? ? ?Diagnostics: Labs, CXR, EKG ? ?Treatments: none immediately ?  ?Teodoro Spray, [place] ?08/12/21 1234 ? ?

## 2021-08-12 NOTE — Discharge Instructions (Signed)
You can double your torsemide dose over the next 3-5 days to help with the swelling. Please follow up with the heart failure clinic. Please return for any shortness of breath. ?

## 2021-08-12 NOTE — ED Notes (Signed)
RN attempted to get IV placed. RN tried twice for placement with no successes.  ?

## 2021-08-14 ENCOUNTER — Ambulatory Visit: Payer: PRIVATE HEALTH INSURANCE | Admitting: Family

## 2021-09-05 ENCOUNTER — Non-Acute Institutional Stay: Payer: Medicare Other | Admitting: Nurse Practitioner

## 2021-09-05 ENCOUNTER — Encounter: Payer: Self-pay | Admitting: Nurse Practitioner

## 2021-09-05 DIAGNOSIS — R630 Anorexia: Secondary | ICD-10-CM

## 2021-09-05 DIAGNOSIS — R600 Localized edema: Secondary | ICD-10-CM

## 2021-09-05 DIAGNOSIS — Z515 Encounter for palliative care: Secondary | ICD-10-CM

## 2021-09-05 DIAGNOSIS — R0602 Shortness of breath: Secondary | ICD-10-CM
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
## 2021-09-15 ENCOUNTER — Non-Acute Institutional Stay: Payer: Medicare Other | Admitting: Nurse Practitioner

## 2021-09-15 ENCOUNTER — Encounter: Payer: Self-pay | Admitting: Nurse Practitioner

## 2021-09-15 DIAGNOSIS — R0602 Shortness of breath: Secondary | ICD-10-CM

## 2021-09-15 DIAGNOSIS — R600 Localized edema: Secondary | ICD-10-CM

## 2021-09-15 DIAGNOSIS — Z515 Encounter for palliative care: Secondary | ICD-10-CM

## 2021-09-15 NOTE — Progress Notes (Signed)
Therapist, nutritional Palliative Care Consult Note Telephone: [phone]  Fax: [phone]    Date of encounter: 09/15/21 7:44 PM PATIENT NAME: Elizabeth Novak [address] [place] [address]   [phone] (home)  DOB: Aug 15, 1923 MRN: [id number] PRIMARY CARE PROVIDER:    Gwenevere Ghazi NP  RESPONSIBLE PARTY:    Contact Information     Name Relation Home Work Mobile   [place] Niece [phone]  [phone]   Dixon,Karen Niece [phone]  [phone]   Margarete, [place]   [phone]   [place] [phone]  [phone]   Mann,Amanda Niece   [phone]      I met face to face with patient in facility. Palliative Care was asked to follow this patient by consultation request of Elizabeth Ghazi NP to address advance care planning and complex medical decision making. This is a follow up visit.                                  ASSESSMENT AND PLAN / RECOMMENDATIONS:  Symptom Management/Plan: 1. Advance Care Planning;  DNR   2. Goals of Care: Goals include to maximize quality of life and symptom management. Our advance care planning conversation included a discussion about:    The value and importance of advance care planning  Exploration of personal, cultural or spiritual beliefs that might influence medical decisions  Exploration of goals of care in the event of a sudden injury or illness  Identification and preparation of a healthcare agent  Review and updating or creation of an advance directive document.   3.  Shortness of breath with edema secondary to CHF, continue to monitor respiratory status, weights, edema, encouraged compression hose. We talked about CHF, with disease progression. Will f/u with J.Hagan NP about goc, medications   4. Palliative care encounter; Palliative care encounter; Palliative medicine team will continue to support patient, patient's family, and medical team. Visit consisted of  counseling and education dealing with the complex and emotionally intense issues of symptom management and palliative care in the setting of serious and potentially life-threatening illness   Follow up Palliative Care Visit: Palliative care will continue to follow for complex medical decision making, advance care planning, and clarification of goals. Return 1 weeks or prn.   I spent 43 minutes providing this consultation starting at 12:00 pm. More than 50% of the time in this consultation was spent in counseling and care coordination. PPS: 50% Chief Complaint: Initial palliative consult for complex medical decision making HISTORY OF PRESENT ILLNESS:  [name] is a 88 y.o. year old female  with multiple medical problems including diastolic CHF due to valvular disease, sick sinus syndrome, LBBB, afib, h/o ischemic left MCA stroke with hemiparesis, pulmonary nodule, HTN, GERD, h/o GI bleed, DM, h/o hyponatremia, anxiety, depression. I visited [name] at [facility], in her room, she was sitting in her chair with her legs up. We talked about purpose of PC visit, [name] in agreement. We talked about functional abilities. [name] endorses no recent falls, ambulates with walker. [name] does use a walker. We talked about ros, symptoms including appetite, sleep patterns. We talked about edema to BLE, compression hose, elevating her legs. We talked about medications, timing of medications. [name] endorses she had to get uo 10 times during the night to urinate. We talked about when she drinks fluids. We talked about medical goals. We  talked about [name] age, progression of disease CHF, with symptomatic edema, some shortness of breath with exertion though resolves with rest. We talked about Medicare Hospice benefit, talked about services provided, how the program works. [name] endorses she had Hospice for her husband and sister, both were in hospice home. [name] endorses she was not ready  for hospice. We talked about eligibility. We talked about her son living in [place]. We talked about role pc in poc. We talked about supportive visit, f/u visit, [name] in agreement. [name] in agreement, updated staff.    History obtained from review of EMR, discussion with primary team, and [name].  I reviewed available labs, medications, imaging, studies and related documents from the EMR.  Records reviewed and summarized above.    ROS 10 point system reviewed all negative except HPI   Physical Exam: Constitutional: NAD General: frail appearing, thin, pleasant female EYES: lids intact ENMT:oral mucous membranes moist CV: S1S2, RRR, +BLE edema Pulmonary: LCTA, no increased work of breathing, no cough, room air Abdomen: normo-active BS + 4 quadrants, soft  MSK: ambulatory with walker Skin: warm and dry Neuro:  + generalized weakness,  no cognitive impairment Psych: non-anxious affect, A and O x 3  Thank you for the opportunity to participate in the care of [name].  The palliative care team will continue to follow. Please call our office at [phone] if we can be of additional assistance.   Klohe Lovering Prince Rome, NP

## 2021-11-06 ENCOUNTER — Non-Acute Institutional Stay: Payer: Medicare Other | Admitting: Nurse Practitioner

## 2021-11-06 DIAGNOSIS — Z515 Encounter for palliative care: Secondary | ICD-10-CM

## 2021-11-06 DIAGNOSIS — I5032 Chronic diastolic (congestive) heart failure: Secondary | ICD-10-CM

## 2021-11-06 DIAGNOSIS — R600 Localized edema: Secondary | ICD-10-CM

## 2021-11-06 DIAGNOSIS — R0602 Shortness of breath: Secondary | ICD-10-CM

## 2021-11-06 NOTE — Progress Notes (Signed)
Therapist, nutritional Palliative Care Consult Note Telephone: [phone]  Fax: [phone]    Date of encounter: 11/06/21 1:13 PM PATIENT NAME: Elizabeth Novak [address] [place] [address]   [phone] (home)  DOB: 21-Feb-1924 MRN: [id number] PRIMARY CARE PROVIDER:    Gwenevere Ghazi NP [facility]  RESPONSIBLE PARTY:     Contact Information       Name Relation Home Work [place] Niece [phone]   [phone]    Dixon,Karen Niece [phone]   [phone]    Briyana, [place]     [phone]    [place] [phone]   [phone]    Mann,Amanda Niece     [phone]         I met face to face with patient in facility. Palliative Care was asked to follow this patient by consultation request of Elizabeth Ghazi NP to address advance care planning and complex medical decision making. This is a follow up visit.                                  ASSESSMENT AND PLAN / RECOMMENDATIONS:  Symptom Management/Plan: 1. Advance Care Planning;  DNR   2. Shortness of breath with edema secondary to CHF, improved significantly, continue to monitor respiratory status, weights, edema, encouraged compression hose. We talked about CHF, with disease progression.    3. Palliative care encounter; Palliative care encounter; Palliative medicine team will continue to support patient, patient's family, and medical team. Visit consisted of counseling and education dealing with the complex and emotionally intense issues of symptom management and palliative care in the setting of serious and potentially life-threatening illness   Follow up Palliative Care Visit: Palliative care will continue to follow for complex medical decision making, advance care planning, and clarification of goals. Return 1 weeks or prn.   I spent 41 minutes providing this consultation starting at 9:50 am. More than 50% of the time in this consultation  was spent in counseling and care coordination. PPS: 50% Chief Complaint: Initial palliative consult for complex medical decision making HISTORY OF PRESENT ILLNESS:  Elizabeth Novak is a 88 y.o. year old female  with multiple medical problems including diastolic CHF due to valvular disease, sick sinus syndrome, LBBB, afib, h/o ischemic left MCA stroke with hemiparesis, pulmonary nodule, HTN, GERD, h/o GI bleed, DM, h/o hyponatremia, anxiety, depression. I visited [name] at [facility], in her room, she was sitting in her chair with her legs up. We talked about purpose of PC visit, [name] in agreement. We talked about functional abilities. [name] endorses no recent falls, ambulates with walker. [name] does use a walker. We talked about ros, symptoms including appetite, sleep patterns. We talked about edema to BLE, compression hose, elevating her legs which has improved significantly. We talked about medications, timing of medications. 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 We talked about quality of life and what brings her joy. We talked about role pc in poc. We talked about supportive visit, f/u visit, [name] in agreement. [name] in agreement, updated staff.    History obtained from review of EMR, discussion with primary team, and [name].  I reviewed available labs, medications, imaging, studies and related documents from the EMR.  Records reviewed and  summarized above.    ROS 10 point system reviewed all negative except HPI   Physical Exam: Constitutional: NAD General: frail appearing, thin, pleasant female EYES: lids intact ENMT:oral mucous membranes moist CV: S1S2, RRR, +BLE edema Pulmonary: LCTA, no increased work of breathing, no cough, room air Abdomen: normo-active BS + 4 quadrants, soft  MSK: ambulatory with walker Skin: warm and dry Neuro:  + generalized weakness,  no  cognitive impairment Psych: non-anxious affect, A and O x 3   Thank you for the opportunity to participate in the care of [name].  The palliative care team will continue to follow. Please call our office at [phone] if we can be of additional assistance.   Airis Barbee Prince Rome, NP

## 2021-11-21 ENCOUNTER — Other Ambulatory Visit (HOSPITAL_COMMUNITY): Payer: Self-pay | Admitting: Nephrology

## 2021-11-21 ENCOUNTER — Other Ambulatory Visit: Payer: Self-pay | Admitting: Nephrology

## 2021-11-21 DIAGNOSIS — R8290 Unspecified abnormal findings in urine: Secondary | ICD-10-CM

## 2021-11-21 DIAGNOSIS — E785 Hyperlipidemia, unspecified: Secondary | ICD-10-CM

## 2021-11-21 DIAGNOSIS — I10 Essential (primary) hypertension: Secondary | ICD-10-CM

## 2021-11-21 DIAGNOSIS — E871 Hypo-osmolality and hyponatremia: Secondary | ICD-10-CM

## 2021-11-21 DIAGNOSIS — I509 Heart failure, unspecified: Secondary | ICD-10-CM

## 2021-11-21 DIAGNOSIS — E1122 Type 2 diabetes mellitus with diabetic chronic kidney disease: Secondary | ICD-10-CM

## 2021-11-21 DIAGNOSIS — D631 Anemia in chronic kidney disease: Secondary | ICD-10-CM

## 2021-12-08 ENCOUNTER — Encounter: Payer: Self-pay | Admitting: Nurse Practitioner

## 2021-12-08 ENCOUNTER — Non-Acute Institutional Stay: Payer: Medicare Other | Admitting: Nurse Practitioner

## 2021-12-08 DIAGNOSIS — I5032 Chronic diastolic (congestive) heart failure: Secondary | ICD-10-CM

## 2021-12-08 DIAGNOSIS — Z515 Encounter for palliative care: Secondary | ICD-10-CM

## 2021-12-08 DIAGNOSIS — R0602 Shortness of breath: Secondary | ICD-10-CM

## 2021-12-08 DIAGNOSIS — R600 Localized edema: Secondary | ICD-10-CM

## 2021-12-08 NOTE — Progress Notes (Signed)
Therapist, nutritional Palliative Care Consult Note Telephone: [phone]  Fax: [phone]    Date of encounter: 12/08/21 2:57 PM PATIENT NAME: Elizabeth Novak [address] [place] [address]   [phone] (home)  DOB: 1923/12/12 MRN: [id number] PRIMARY CARE PROVIDER:   [facility],  [address] [place] [address]  RESPONSIBLE PARTY:    Contact Information     Name Relation Home Work [place] Niece [phone]  [phone]   Dixon,Karen Niece [phone]  [phone]   Tashena, Lenard   [phone]   [place] [phone]  [phone]   Mann,Amanda Niece   [phone]       I met face to face with patient in facility. Palliative Care was asked to follow this patient by consultation request of Gwenevere Ghazi NP to address advance care planning and complex medical decision making. This is a follow up visit.                                  ASSESSMENT AND PLAN / RECOMMENDATIONS:  Symptom Management/Plan: 1. Advance Care Planning;  DNR   2. Shortness of breath with edema secondary to CHF, stable, increase edema in BLE; denies sob; continue to monitor respiratory status, weights, edema, encouraged compression hose. We talked about CHF, with disease progression.  Reviewed weights, stable  10/04/2021 weight 103 lbs 12/08/2021 weight 104.5 lbs   3. Palliative care encounter; Palliative care encounter; Palliative medicine team will continue to support patient, patient's family, and medical team. Visit consisted of counseling and education dealing with the complex and emotionally intense issues of symptom management and palliative care in the setting of serious and potentially life-threatening illness   Follow up Palliative Care Visit: Palliative care will continue to follow for complex medical decision making, advance care planning, and clarification  of goals. Return 1 weeks or prn.   I spent 45 minutes providing this consultation starting at 9:50 am. More than 50% of the time in this consultation was spent in counseling and care coordination. PPS: 40% Chief Complaint: Initial palliative consult for complex medical decision making HISTORY OF PRESENT ILLNESS:  Elizabeth Novak is a 88 y.o. year old female  with multiple medical problems including diastolic CHF due to valvular disease, sick sinus syndrome, LBBB, afib, h/o ischemic left MCA stroke with hemiparesis, pulmonary nodule, HTN, GERD, h/o GI bleed, DM, h/o hyponatremia, anxiety, depression. I visited Elizabeth Novak at [facility], in her room, she was sitting in her chair with her legs up. We talked about purpose of PC visit, Elizabeth Novak in agreement. We talked about ros, symptoms including edema to her abdomen. Elizabeth Novak endorses she feels like she has a ball in her abdomen. Elizabeth Novak and I talked about the edema including abdomen and BLE. We talked weights, appetite, chronic disease progression, functional abilities continue to ambulate with walker. We talked about no recent falls, hospitalizations, wounds, infections. We talked about residing at facility, quality of life, medical goals reviewed. We talked about role pc in poc. We talked about supportive visit, f/u visit, Elizabeth Novak in agreement. Elizabeth Novak in agreement, updated staff.    History obtained from review of EMR, discussion with primary team, and Elizabeth Novak.  I reviewed available labs, medications, imaging, studies and related documents from the EMR.  Records reviewed and summarized above.    ROS 10  point system reviewed all negative except HPI   Physical Exam: Constitutional: NAD General: frail appearing, thin, pleasant female EYES: lids intact ENMT:oral mucous membranes moist CV: S1S2, RRR, +BLE edema Pulmonary: LCTA, no increased work of breathing, no cough, room air Abdomen: normo-active BS + 4 quadrants, soft  MSK:  ambulatory with walker Skin: warm and dry Neuro:  + generalized weakness,  no cognitive impairment Psych: non-anxious affect, A and O x 3  Thank you for the opportunity to participate in the care of Elizabeth Novak.  The palliative care team will continue to follow. Please call our office at [phone] if we can be of additional assistance.   Jafeth Mustin Prince [place], NP

## 2022-02-04 ENCOUNTER — Non-Acute Institutional Stay: Payer: Medicare Other | Admitting: Nurse Practitioner

## 2022-02-04 ENCOUNTER — Encounter: Payer: Self-pay | Admitting: Nurse Practitioner

## 2022-02-04 DIAGNOSIS — I5032 Chronic diastolic (congestive) heart failure: Secondary | ICD-10-CM

## 2022-02-04 DIAGNOSIS — R0602 Shortness of breath: Secondary | ICD-10-CM

## 2022-02-04 DIAGNOSIS — R600 Localized edema: Secondary | ICD-10-CM

## 2022-02-04 DIAGNOSIS — Z515 Encounter for palliative care: Secondary | ICD-10-CM

## 2022-02-04 NOTE — Progress Notes (Signed)
Therapist, nutritional Palliative Care Consult Note Telephone: [phone]  Fax: [phone]    Date of encounter: 02/04/22 6:52 PM PATIENT NAME: Elizabeth Novak [address] [place] [address]   [phone] (home)  DOB: 22-Jun-1923 MRN: [id number] PRIMARY CARE PROVIDER:    [facility] Novak, [place]  RESPONSIBLE PARTY:    Contact Information     Name Relation Home Work [place] Niece [phone]  [phone]   Dixon,Elizabeth Novak Niece [phone]  [phone]   Elizabeth Novak, [place]   [phone]   Elizabeth Novak [phone]  [phone]   Mann,Elizabeth Novak Niece   [phone]          I met face to face with patient in facility. Palliative Care was asked to follow this patient by consultation request of Elizabeth Ghazi NP to address advance care planning and complex medical decision making. This is a follow up visit.                                  ASSESSMENT AND PLAN / RECOMMENDATIONS:  Symptom Management/Plan: 1. Advance Care Planning;  DNR   2. Shortness of breath with edema secondary to CHF, stable, increase edema in BLE; denies sob; continue to monitor respiratory status, weights, edema, encouraged compression hose. We talked about CHF, with disease progression.  Reviewed weights, stable  10/04/2021 weight 103 lbs 12/08/2021 weight 104.5 lbs   3. Palliative care encounter; Palliative care encounter; Palliative medicine team will continue to support patient, patient's family, and medical team. Visit consisted of counseling and education dealing with the complex and emotionally intense issues of symptom management and palliative care in the setting of serious and potentially life-threatening illness   Follow up Palliative Care Visit: Palliative care will continue to follow for complex medical decision making, advance care planning, and clarification of goals. Return 1 month   I spent 41 minutes providing this consultation  starting at 9:50 am. More than 50% of the time in this consultation was spent in counseling and care coordination. PPS: 40% Chief Complaint: Initial palliative consult for complex medical decision making HISTORY OF PRESENT ILLNESS:  Elizabeth Novak is a 88 y.o. year old female  with multiple medical problems including diastolic CHF due to valvular disease, sick sinus syndrome, LBBB, afib, h/o ischemic left MCA stroke with hemiparesis, pulmonary nodule, HTN, GERD, h/o GI bleed, DM, h/o hyponatremia, anxiety, depression. I visited [name] at [facility], in her room, she was sitting in her chair.  We talked about residing at facility, quality of life, medical goals reviewed. We talked about role pc in poc. We talked about supportive visit, f/u visit, [name] in agreement. [name] in agreement, updated staff.    History obtained from review of EMR, discussion with primary team, and [name].  I reviewed available labs, medications, imaging, studies and related documents from the EMR.  Records reviewed and summarized above.    ROS 10 point system reviewed all negative except HPI   Physical Exam: Constitutional: NAD General: frail appearing, thin, pleasant female EYES: lids intact ENMT:oral mucous membranes moist CV: S1S2, RRR, +BLE edema Pulmonary: LCTA, no increased work of breathing, no cough, room air Abdomen: normo-active BS + 4 quadrants, soft  MSK: ambulatory with walker Skin: warm and dry Neuro:  + generalized weakness,  no cognitive impairment Psych: non-anxious affect, A and O x 3 Thank you for the opportunity to participate  in the care of [name].  The palliative care team will continue to follow. Please call our office at [phone] if we can be of additional assistance.   Elizabeth Novak Prince [place], NP

## 2022-02-05 ENCOUNTER — Observation Stay
Admission: EM | Admit: 2022-02-05 | Discharge: 2022-02-06 | Disposition: A | Discharge status: Home Health | Payer: Medicare Other | Attending: Internal Medicine | Admitting: Internal Medicine

## 2022-02-05 ENCOUNTER — Other Ambulatory Visit: Payer: Self-pay

## 2022-02-05 ENCOUNTER — Emergency Department: Payer: Medicare Other

## 2022-02-05 DIAGNOSIS — Z8673 Personal history of transient ischemic attack (TIA), and cerebral infarction without residual deficits: Secondary | ICD-10-CM | POA: Insufficient documentation

## 2022-02-05 DIAGNOSIS — I495 Sick sinus syndrome: Secondary | ICD-10-CM | POA: Diagnosis present

## 2022-02-05 DIAGNOSIS — I130 Hypertensive heart and chronic kidney disease with heart failure and stage 1 through stage 4 chronic kidney disease, or unspecified chronic kidney disease: Secondary | ICD-10-CM | POA: Insufficient documentation

## 2022-02-05 DIAGNOSIS — F0390 Unspecified dementia without behavioral disturbance: Secondary | ICD-10-CM | POA: Diagnosis not present

## 2022-02-05 DIAGNOSIS — Z7984 Long term (current) use of oral hypoglycemic drugs: Secondary | ICD-10-CM | POA: Insufficient documentation

## 2022-02-05 DIAGNOSIS — D649 Anemia, unspecified: Secondary | ICD-10-CM | POA: Insufficient documentation

## 2022-02-05 DIAGNOSIS — E785 Hyperlipidemia, unspecified: Secondary | ICD-10-CM | POA: Diagnosis present

## 2022-02-05 DIAGNOSIS — Z7901 Long term (current) use of anticoagulants: Secondary | ICD-10-CM | POA: Diagnosis not present

## 2022-02-05 DIAGNOSIS — I152 Hypertension secondary to endocrine disorders: Secondary | ICD-10-CM | POA: Diagnosis present

## 2022-02-05 DIAGNOSIS — E119 Type 2 diabetes mellitus without complications: Secondary | ICD-10-CM

## 2022-02-05 DIAGNOSIS — Z950 Presence of cardiac pacemaker: Secondary | ICD-10-CM | POA: Diagnosis not present

## 2022-02-05 DIAGNOSIS — Z1152 Encounter for screening for COVID-19: Secondary | ICD-10-CM | POA: Diagnosis not present

## 2022-02-05 DIAGNOSIS — Z79899 Other long term (current) drug therapy: Secondary | ICD-10-CM | POA: Insufficient documentation

## 2022-02-05 DIAGNOSIS — N189 Chronic kidney disease, unspecified: Secondary | ICD-10-CM | POA: Diagnosis not present

## 2022-02-05 DIAGNOSIS — I5023 Acute on chronic systolic (congestive) heart failure: Secondary | ICD-10-CM | POA: Diagnosis not present

## 2022-02-05 DIAGNOSIS — I482 Chronic atrial fibrillation, unspecified: Secondary | ICD-10-CM | POA: Insufficient documentation

## 2022-02-05 DIAGNOSIS — I10 Essential (primary) hypertension: Secondary | ICD-10-CM | POA: Diagnosis present

## 2022-02-05 DIAGNOSIS — Z853 Personal history of malignant neoplasm of breast: Secondary | ICD-10-CM | POA: Diagnosis not present

## 2022-02-05 DIAGNOSIS — R0609 Other forms of dyspnea: Principal | ICD-10-CM | POA: Insufficient documentation

## 2022-02-05 DIAGNOSIS — R0602 Shortness of breath: Secondary | ICD-10-CM | POA: Diagnosis present

## 2022-02-05 DIAGNOSIS — I4891 Unspecified atrial fibrillation: Secondary | ICD-10-CM | POA: Diagnosis present

## 2022-02-05 DIAGNOSIS — Z794 Long term (current) use of insulin: Secondary | ICD-10-CM | POA: Diagnosis not present

## 2022-02-05 DIAGNOSIS — F418 Other specified anxiety disorders: Secondary | ICD-10-CM | POA: Diagnosis present

## 2022-02-05 LAB — CBC WITH DIFFERENTIAL/PLATELET
Abs Immature Granulocytes: 0.01 10*3/uL (ref 0.00–0.07)
Basophils Absolute: 0 10*3/uL (ref 0.0–0.1)
Basophils Relative: 0 %
Eosinophils Absolute: 0.1 10*3/uL (ref 0.0–0.5)
Eosinophils Relative: 2 %
HCT: 26.9 % — ABNORMAL LOW (ref 36.0–46.0)
Hemoglobin: 8.5 g/dL — ABNORMAL LOW (ref 12.0–15.0)
Immature Granulocytes: 0 %
Lymphocytes Relative: 12 %
Lymphs Abs: 0.7 10*3/uL (ref 0.7–4.0)
MCH: 25.7 pg — ABNORMAL LOW (ref 26.0–34.0)
MCHC: 31.6 g/dL (ref 30.0–36.0)
MCV: 81.3 fL (ref 80.0–100.0)
Monocytes Absolute: 0.5 10*3/uL (ref 0.1–1.0)
Monocytes Relative: 8 %
Neutro Abs: 4.7 10*3/uL (ref 1.7–7.7)
Neutrophils Relative %: 78 %
Platelets: 284 10*3/uL (ref 150–400)
RBC: 3.31 MIL/uL — ABNORMAL LOW (ref 3.87–5.11)
RDW: 14.7 % (ref 11.5–15.5)
WBC: 6.1 10*3/uL (ref 4.0–10.5)
nRBC: 0 % (ref 0.0–0.2)

## 2022-02-05 LAB — URINALYSIS, ROUTINE W REFLEX MICROSCOPIC
Bacteria, UA: NONE SEEN
Bilirubin Urine: NEGATIVE
Glucose, UA: NEGATIVE mg/dL
Ketones, ur: NEGATIVE mg/dL
Nitrite: NEGATIVE
Protein, ur: NEGATIVE mg/dL
Specific Gravity, Urine: 1.006 (ref 1.005–1.030)
pH: 5 (ref 5.0–8.0)

## 2022-02-05 LAB — COMPREHENSIVE METABOLIC PANEL
ALT: 13 U/L (ref 0–44)
AST: 19 U/L (ref 15–41)
Albumin: 3.4 g/dL — ABNORMAL LOW (ref 3.5–5.0)
Alkaline Phosphatase: 51 U/L (ref 38–126)
Anion gap: 6 (ref 5–15)
BUN: 28 mg/dL — ABNORMAL HIGH (ref 8–23)
CO2: 28 mmol/L (ref 22–32)
Calcium: 9.4 mg/dL (ref 8.9–10.3)
Chloride: 104 mmol/L (ref 98–111)
Creatinine, Ser: 1.04 mg/dL — ABNORMAL HIGH (ref 0.44–1.00)
GFR, Estimated: 49 mL/min — ABNORMAL LOW (ref >60)
Glucose, Bld: 201 mg/dL — ABNORMAL HIGH (ref 70–99)
Potassium: 3.5 mmol/L (ref 3.5–5.1)
Sodium: 138 mmol/L (ref 135–145)
Total Bilirubin: 0.8 mg/dL (ref 0.3–1.2)
Total Protein: 6.5 g/dL (ref 6.5–8.1)

## 2022-02-05 LAB — RESP PANEL BY RT-PCR (FLU A&B, COVID) ARPGX2
Influenza A by PCR: NEGATIVE
Influenza B by PCR: NEGATIVE
SARS Coronavirus 2 by RT PCR: NEGATIVE

## 2022-02-05 LAB — IRON AND TIBC
Iron: 17 ug/dL — ABNORMAL LOW (ref 28–170)
Saturation Ratios: 4 % — ABNORMAL LOW (ref 10.4–31.8)
TIBC: 416 ug/dL (ref 250–450)
UIBC: 399 ug/dL

## 2022-02-05 LAB — TROPONIN I (HIGH SENSITIVITY): Troponin I (High Sensitivity): 10 ng/L (ref <18)

## 2022-02-05 LAB — LIPASE, BLOOD: Lipase: 51 U/L (ref 11–51)

## 2022-02-05 LAB — SAMPLE TO BLOOD BANK

## 2022-02-05 LAB — FOLATE: Folate: 21.9 ng/mL (ref >5.9)

## 2022-02-05 LAB — VITAMIN B12: Vitamin B-12: 264 pg/mL (ref 180–914)

## 2022-02-05 LAB — FERRITIN: Ferritin: 9 ng/mL — ABNORMAL LOW (ref 11–307)

## 2022-02-05 LAB — BRAIN NATRIURETIC PEPTIDE: B Natriuretic Peptide: 256.9 pg/mL — ABNORMAL HIGH (ref 0.0–100.0)

## 2022-02-05 LAB — CBG MONITORING, ED: Glucose-Capillary: 246 mg/dL — ABNORMAL HIGH (ref 70–99)

## 2022-02-05 MED ORDER — FUROSEMIDE 10 MG/ML IJ SOLN
40.0000 mg | Freq: Once | INTRAMUSCULAR | Status: AC
  Administered 2022-02-05: 40 mg via INTRAVENOUS
  Filled 2022-02-05: qty 4

## 2022-02-05 MED ORDER — FUROSEMIDE 10 MG/ML IJ SOLN
40.0000 mg | Freq: Two times a day (BID) | INTRAMUSCULAR | Status: DC
  Administered 2022-02-06: 40 mg via INTRAVENOUS
  Filled 2022-02-05: qty 4

## 2022-02-05 MED ORDER — DIGOXIN 125 MCG PO TABS
0.1250 mg | ORAL_TABLET | Freq: Every day | ORAL | Status: DC
  Administered 2022-02-06: 0.125 mg via ORAL
  Filled 2022-02-05: qty 1

## 2022-02-05 MED ORDER — ISOSORBIDE MONONITRATE ER 30 MG PO TB24
30.0000 mg | ORAL_TABLET | Freq: Every day | ORAL | Status: DC
  Administered 2022-02-06: 30 mg via ORAL
  Filled 2022-02-05: qty 1

## 2022-02-05 MED ORDER — SERTRALINE HCL 50 MG PO TABS
25.0000 mg | ORAL_TABLET | Freq: Every day | ORAL | Status: DC
  Administered 2022-02-06: 25 mg via ORAL
  Filled 2022-02-05: qty 1

## 2022-02-05 MED ORDER — ACETAMINOPHEN 650 MG RE SUPP: 650.0000 mg | Freq: Four times a day (QID) | RECTAL | Status: DC | PRN

## 2022-02-05 MED ORDER — DOCUSATE SODIUM 100 MG PO CAPS: 100.0000 mg | ORAL_CAPSULE | Freq: Two times a day (BID) | ORAL | Status: DC | PRN

## 2022-02-05 MED ORDER — ACETAMINOPHEN 325 MG PO TABS
650.0000 mg | ORAL_TABLET | Freq: Four times a day (QID) | ORAL | Status: DC | PRN
  Filled 2022-02-05: qty 2

## 2022-02-05 MED ORDER — NITROGLYCERIN 0.4 MG SL SUBL: 0.4000 mg | SUBLINGUAL_TABLET | SUBLINGUAL | Status: DC | PRN

## 2022-02-05 MED ORDER — INSULIN ASPART 100 UNIT/ML IJ SOLN
0.0000 [IU] | Freq: Every day | INTRAMUSCULAR | Status: DC
  Administered 2022-02-05: 2 [IU] via SUBCUTANEOUS
  Filled 2022-02-05: qty 1

## 2022-02-05 MED ORDER — HEPARIN SODIUM (PORCINE) 5000 UNIT/ML IJ SOLN
5000.0000 [IU] | Freq: Three times a day (TID) | INTRAMUSCULAR | Status: DC
  Administered 2022-02-05 – 2022-02-06 (×3): 5000 [IU] via SUBCUTANEOUS
  Filled 2022-02-05 (×4): qty 1

## 2022-02-05 MED ORDER — ALLOPURINOL 100 MG PO TABS
100.0000 mg | ORAL_TABLET | Freq: Every day | ORAL | Status: DC
  Administered 2022-02-06: 100 mg via ORAL
  Filled 2022-02-05: qty 1

## 2022-02-05 MED ORDER — ONDANSETRON HCL 4 MG PO TABS: 4.0000 mg | ORAL_TABLET | Freq: Four times a day (QID) | ORAL | Status: DC | PRN

## 2022-02-05 MED ORDER — SODIUM CHLORIDE 0.9 % IV SOLN
200.0000 mg | Freq: Once | INTRAVENOUS | Status: AC
  Administered 2022-02-05: 200 mg via INTRAVENOUS
  Filled 2022-02-05: qty 200

## 2022-02-05 MED ORDER — INSULIN ASPART 100 UNIT/ML IJ SOLN
0.0000 [IU] | Freq: Three times a day (TID) | INTRAMUSCULAR | Status: DC
  Administered 2022-02-06: 3 [IU] via SUBCUTANEOUS
  Filled 2022-02-05: qty 1

## 2022-02-05 MED ORDER — METOPROLOL SUCCINATE ER 50 MG PO TB24
50.0000 mg | ORAL_TABLET | Freq: Every day | ORAL | Status: DC
  Administered 2022-02-06: 50 mg via ORAL
  Filled 2022-02-05: qty 1

## 2022-02-05 MED ORDER — ONDANSETRON HCL 4 MG/2ML IJ SOLN: 4.0000 mg | Freq: Four times a day (QID) | INTRAMUSCULAR | Status: DC | PRN

## 2022-02-05 MED ORDER — INSULIN GLARGINE-YFGN 100 UNIT/ML ~~LOC~~ SOLN
9.0000 [IU] | Freq: Two times a day (BID) | SUBCUTANEOUS | Status: DC
  Administered 2022-02-05: 9 [IU] via SUBCUTANEOUS
  Filled 2022-02-05 (×2): qty 0.09

## 2022-02-05 NOTE — ED Provider Notes (Signed)
I was called to the patient's room as the original plan was to be discharged.  On my assessment, the patient states that she continues to feel short of breath and feels like she cannot really talk in full sentences due to her abdominal distention.  She states that she has not really peed much since receiving Lasix.  She is nervous about increasing Lasix at her facility as she has to get up a lot to pee and is concerned about this.  She does have pulmonary edema on chest x-ray with bilateral rales on exam and mild tachypnea.  She has a history of intermittent CKD and is concerned about her renal function for diuresis.  She also has some worsening fatigue with decreasing hemoglobin significantly below her baseline although she is Hemoccult negative here.  I had a long discussion with her as well as her son.  They are very concerned about her ability to tolerate diuresis at her facility and are concerned about her shortness of breath.  Based on shared decision making, will admit for IV diuresis which I think is reasonable given her age and significant edema on exam as well as bilateral edema on chest x-ray.   Duffy Bruce, MD 02/05/22 [phone]

## 2022-02-05 NOTE — Assessment & Plan Note (Signed)
-   Insulin long-acting glargine 9 units subcutaneous twice daily resumed - Insulin SSI with agents coverage ordered

## 2022-02-05 NOTE — Assessment & Plan Note (Signed)
-   Resumed home sertraline 25 mg daily

## 2022-02-05 NOTE — ED Notes (Signed)
D/C discussed with pt and pt states she is still concerned about her ABD distention and blood counts, MD aware and states he will reassess pt.

## 2022-02-05 NOTE — ED Provider Notes (Signed)
[facility] Provider Note    Event Date/Time   First MD Initiated Contact with Patient 02/05/22 1131     (approximate)   History   Chief Complaint: Shortness of Breath   HPI  Elizabeth Novak is a 88 y.o. female with a past history of CHF, atrial fibrillation, GERD, diabetes who was sent to the ED for evaluation of shortness of breath, concern for GI bleed due to decreased hemoglobin level.  She is on Eliquis.  Patient denies any pain, but states that she does feel more short of breath for the past few weeks.  She has been compliant with her medications.  Denies cough or fever.  No dysuria.     Physical Exam   Triage Vital Signs: ED Triage Vitals  Enc Vitals Group     BP 02/05/22 1134 123/61     Pulse Rate 02/05/22 1134 72     Resp 02/05/22 1134 17     Temp 02/05/22 1134 97.8 F (36.6 C)     Temp Source 02/05/22 1134 Oral     SpO2 02/05/22 1134 100 %     Weight --      Height --      Head Circumference --      Peak Flow --      Pain Score 02/05/22 1139 0     Pain Loc --      Pain Edu? --      Excl. in [place]? --     Most recent vital signs: Vitals:   02/05/22 1134 02/05/22 1400  BP: 123/61 (!) 109/53  Pulse: 72 64  Resp: 17 (!) 21  Temp: 97.8 F (36.6 C)   SpO2: 100% 100%    General: Awake, no distress.  CV:  Good peripheral perfusion.  Regular rate.  Symmetric distal pulses. Resp:  Normal effort.  Clear to auscultation bilaterally.  No distress Abd:  No distention.  Soft nontender.  Rectal exam shows brown stool, Hemoccult negative. Other:  2+ pitting edema bilateral lower extremities.   ED Results / Procedures / Treatments   Labs (all labs ordered are listed, but only abnormal results are displayed) Labs Reviewed  COMPREHENSIVE METABOLIC PANEL - Abnormal; Notable for the following components:      Result Value   Glucose, Bld 201 (*)    BUN 28 (*)    Creatinine, Ser 1.04 (*)    Albumin 3.4 (*)    GFR, Estimated 49 (*)     All other components within normal limits  CBC WITH DIFFERENTIAL/PLATELET - Abnormal; Notable for the following components:   RBC 3.31 (*)    Hemoglobin 8.5 (*)    HCT 26.9 (*)    MCH 25.7 (*)    All other components within normal limits  URINALYSIS, ROUTINE W REFLEX MICROSCOPIC - Abnormal; Notable for the following components:   Color, Urine STRAW (*)    APPearance CLEAR (*)    Hgb urine dipstick MODERATE (*)    Leukocytes,Ua SMALL (*)    All other components within normal limits  RESP PANEL BY RT-PCR (FLU A&B, COVID) ARPGX2  LIPASE, BLOOD  SAMPLE TO BLOOD BANK  TROPONIN I (HIGH SENSITIVITY)     EKG Interpreted by me Sinus rhythm, rate of 72.  Left axis, left bundle branch block.  No acute ischemic changes.   RADIOLOGY Chest x-ray interpreted by me, negative for consolidation or pleural effusion or large pulmonary edema.  Radiology report reviewed noting mild pulmonary edema in  bilateral bases.   PROCEDURES:  Procedures   MEDICATIONS ORDERED IN ED: Medications  furosemide (LASIX) injection 40 mg (40 mg Intravenous Given 02/05/22 1353)     IMPRESSION / MDM / ASSESSMENT AND PLAN / ED COURSE  I reviewed the triage vital signs and the nursing notes.                              Differential diagnosis includes, but is not limited to, CHF exacerbation, non-STEMI, electrolyte abnormality, AKI, UTI, viral illness  Patient's presentation is most consistent with acute presentation with potential threat to life or bodily function.  Patient presents with shortness of breath, clinical stigmata of CHF.  She has some mild edema on chest x-ray.  Pronounced peripheral edema on exam.  She is not in distress, oxygenation is 100% on room air, mental status is sharp and energy level is vigorous.  Lab panel is all reassuring.  Patient given IV Lasix in the ED.  She reports that she is feeling better and symptoms have returned to baseline.  If she is producing urine output and beginning  to diurese in the ED, I think she is suitable for discharge back to [facility] for continued management and close follow-up with cardiology/heart failure clinic.  I will have her increase torsemide for the next 3 days in the meantime.  I discussed this with the patient, and she reports no hesitation with returning back home, so in shared decision-making I think we agree that she does not require hospitalization.   Clinical Course as of 02/05/22 1544  Thu Feb 05, 2022  1420 DG Chest Portable 1 View [PS]    Clinical Course User Index [PS] Carrie Mew, MD     FINAL CLINICAL IMPRESSION(S) / ED DIAGNOSES   Final diagnoses:  Acute on chronic systolic congestive heart failure (HCC)  Chronic atrial fibrillation ([place])  Type 2 diabetes mellitus without complication, without long-term current use of insulin ([place])     Rx / DC Orders   ED Discharge Orders     None        Note:  This document was prepared using Dragon voice recognition software and may include unintentional dictation errors.   Carrie Mew, MD 02/05/22 1549

## 2022-02-05 NOTE — ED Triage Notes (Signed)
Pt presents to ED via [facility] from [facility] with c/o of SOB and possible GI bleed. Pt states she had a BM this morning and states her stool was bright red blood, pt states HX of Hemorids. Pt does take Eliquis.   Pt states she started to feel SOB "while ago". Pt states HX of CHF. Pt denies any other underlying lung issues. Pt is speaking in full sentences without issue. Pt is A&Ox4. NAD noted.   Pt does state some weight gain and does endorse ABD swelling. Pt does have some distention noted.

## 2022-02-05 NOTE — H&P (Signed)
History and Physical   Elizabeth Novak WJX:[id number] DOB: 04-17-1923 DOA: 02/05/2022  PCP: Diona Browner [facility] Specialists: Dr. Lady Gary, cardiology Patient coming from: [facility]  I have personally briefly reviewed patient's old medical records in [facility] EMR.  Chief Concern: Weakness and shortness of breath  HPI: Elizabeth Novak is a 88 year old female with history of sick sinus syndrome, hypertension, hyperlipidemia, insulin-dependent diabetes mellitus, depression, dementia, GERD, chronic constipation, who presents emergency department from [facility] for chief concerns of shortness of breath.  Initial vitals in the emergency department showed temperature of 97.8, respiration rate of 17, heart rate of 72, blood pressure 123/61, SPO2 100% on room air.  Serum sodium is 138, potassium 3.5, chloride 104, bicarb 28, BUN of 28, serum creatinine 1.04, GFR 49, nonfasting blood glucose 201, high sensitive troponin was 10, WBC was 6.1, hemoglobin 8.5, platelets of 284.  UA was positive for small leukocytes.  COVID/influenza A/influenza B PCR were negative.  UA was positive for small leukocytes.  ED treatment: Furosemide 40 mg IV one-time dose  At bedside, Elizabeth Novak is able to tell me her name, age, current location, current calendar year is 2022. Elizabeth Novak does not appear to be in acute distress.  Elizabeth Novak reports that Elizabeth Novak has been having worsening shortness of breath that is better with the improved with fluid medications.  Elizabeth Novak reports the shortness of breath is worse with exertion.  Elizabeth Novak reports that Elizabeth Novak has ongoing shortness of breath however has been worse over the last few days.  Elizabeth Novak denies chest pain, dysuria, hematuria, diarrhea.  Elizabeth Novak endorses increased frequency since being given the IV fluid medication in the emergency department.  Elizabeth Novak denies diarrhea, trauma to her person, syncope, loss of consciousness.  Social history: Elizabeth Novak is from [place].  Elizabeth Novak  denies tobacco, EtOH, recreational drug use.  ROS: Constitutional: no weight change, no fever ENT/Mouth: no sore throat, no rhinorrhea Eyes: no eye pain, no vision changes Cardiovascular: no chest pain, + dyspnea,  no edema, no palpitations Respiratory: no cough, no sputum, no wheezing Gastrointestinal: no nausea, no vomiting, no diarrhea, no constipation Genitourinary: no urinary incontinence, no dysuria, no hematuria Musculoskeletal: no arthralgias, no myalgias Skin: no skin lesions, no pruritus, Neuro: + weakness, no loss of consciousness, no syncope Psych: no anxiety, no depression, + decrease appetite Heme/Lymph: no bruising, no bleeding  ED Course: Discussed with emergency medicine provider, patient requiring hospitalization for chief concerns of shortness of breath  Assessment/Plan  Principal Problem:   Dyspnea on exertion Active Problems:   Atrial fibrillation (HCC)   Sick sinus syndrome (HCC)   Hypertension associated with diabetes (HCC)   Anxiety with depression   History of CVA (cerebrovascular accident)   S/P placement of cardiac pacemaker   HTN (hypertension)   HLD (hyperlipidemia)   Insulin dependent type 2 diabetes mellitus (HCC)   Acute on chronic anemia   Assessment and Plan:  * Dyspnea on exertion - With elevated BNP - Presumptive diagnosis is secondary to fluid overload complicated by iron deficiency anemia - Strict I's and O's - Status post furosemide 40 mg IV one-time dose - I ordered furosemide 40 mg IV twice daily, 2 doses ordered for 02/06/2022  Acute on chronic anemia Presumptive diagnosis is iron deficiency anemia, anemia panel has been ordered - Patient's baseline hemoglobin is 10.6-11.4 about 6 months ago - One-time dose of Venofer 200 mg IV ordered - CBC in a.m.  Insulin dependent type 2 diabetes mellitus (HCC) - Insulin long-acting  glargine 9 units subcutaneous twice daily resumed - Insulin SSI with agents coverage ordered  HTN  (hypertension) - Metoprolol succinate 50 mg daily, isosorbide mononitrate 30 mg daily resumed  Anxiety with depression - Resumed home sertraline 25 mg daily  Code status: God is in control, so if it's my time, I want to go. God is telling me what to do.  Chart reviewed.   Complete echo on 07/26/2021: Ejection fraction estimated at 65 to 70%.  Left atrial and right atrial severely dilated.  DVT prophylaxis: Holding home apixaban due to acute on chronic anemia Code Status: DNR Diet: Heart healthy/carb modified Family Communication: No Disposition Plan: Pending clinical course Consults called: None at the time Admission status: Telemetry cardiac, observation  Past Medical History:  Diagnosis Date   A-fib (HCC)    Anemia    Arthritis    Breast cancer (HCC) 1978   left breast with lymph node removal   CHF (congestive heart failure) (HCC)    Diabetes mellitus without complication (HCC)    Diverticulitis    Dysrhythmia    GERD (gastroesophageal reflux disease)    Hyperlipidemia    Macular degeneration of both eyes    Mitral valve prolapse    Mitral valve regurgitation    Presence of permanent cardiac pacemaker    Past Surgical History:  Procedure Laterality Date   ABDOMINAL HYSTERECTOMY     APPENDECTOMY     BREAST SURGERY     CARDIAC CATHETERIZATION     ESOPHAGEAL DILATION     EYE SURGERY Bilateral    Cataract Extraction with IOL   INSERT / REPLACE / REMOVE PACEMAKER     IRRIGATION AND DEBRIDEMENT HEMATOMA Left 08/21/2015   Procedure: IRRIGATION AND DEBRIDEMENT HEMATOMA;  Surgeon: Earline Mayotte, MD;  Location: [facility] ORS;  Service: General;  Laterality: Left;   KNEE ARTHROSCOPY Right    LEFT OOPHORECTOMY Left    MASTECTOMY Left 1978   MASTECTOMY Right 1978   OPEN REDUCTION INTERNAL FIXATION (ORIF) DISTAL RADIAL FRACTURE Right 02/14/2018   Procedure: OPEN REDUCTION INTERNAL FIXATION (ORIF) DISTAL RADIAL FRACTURE;  Surgeon: Kennedy Bucker, MD;  Location: [facility] ORS;   Service: Orthopedics;  Laterality: Right;   PACEMAKER INSERTION  08/11/12   PPM GENERATOR CHANGEOUT N/A 05/02/2020   Procedure: PPM GENERATOR CHANGEOUT;  Surgeon: Marcina Millard, MD;  Location: [facility] INVASIVE CV LAB;  Service: Cardiovascular;  Laterality: N/A;   TEMPORAL ARTERY BIOPSY / LIGATION     TONSILLECTOMY     Social History:  reports that Elizabeth Novak has never smoked. Elizabeth Novak has never used smokeless tobacco. Elizabeth Novak reports that Elizabeth Novak does not drink alcohol and does not use drugs.  Allergies  Allergen Reactions   Codeine Nausea Only   Disopyramide     Other reaction(s): Unknown   Ibuprofen Diarrhea   Iodine     blisters   Metformin And Related Other (See Comments)    unknown   Norpace [Disopyramide Phosphate]    Quinidine     Other reaction(s): Unknown   Terfenadine     Other reaction(s): Unknown   Topiramate     Other reaction(s): Other (See Comments) Hair loss   Verapamil     Other reaction(s): Unknown   Dexamethasone Sodium Phosphate Palpitations   Family History  Problem Relation Age of Onset   Hypertension Mother    Family history: Family history reviewed and not pertinent  Prior to Admission medications   Medication Sig Start Date End Date Taking? Authorizing Provider  acetaminophen (TYLENOL) 325  MG tablet Take 2 tablets (650 mg total) by mouth every 6 (six) hours as needed for mild pain. 04/29/21  Yes Leeroy Bock, MD  allopurinol (ZYLOPRIM) 100 MG tablet Take 100 mg by mouth daily.   Yes [provider]  apixaban (ELIQUIS) 2.5 MG TABS tablet Take 2.5 mg by mouth 2 (two) times daily.   Yes [provider]  Cholecalciferol (VITAMIN D) 50 MCG (2000 UT) tablet Take 2,000 Units by mouth daily.   Yes [provider]  diclofenac Sodium (VOLTAREN) 1 % GEL Apply 2 g topically 3 (three) times daily. (Apply to knees)   Yes [provider]  digoxin (LANOXIN) 0.125 MG tablet Take 1 tablet (0.125 mg total) by mouth daily. 08/02/21  Yes Lurene Shadow, MD  docusate sodium (COLACE) 100 MG capsule Take 100 mg by mouth 2 (two) times daily.   Yes [provider]  glipiZIDE (GLUCOTROL) 10 MG tablet Take 10 mg by mouth daily.   Yes [provider]  hydrocortisone 2.5 % cream Apply 1 Application topically 2 (two) times daily. 01/27/22 02/10/22 Yes [provider]  insulin glargine-yfgn (SEMGLEE) 100 UNIT/ML injection Inject 0.09 mLs (9 Units total) into the skin 2 (two) times daily. 08/02/21  Yes Lurene Shadow, MD  isosorbide mononitrate (IMDUR) 30 MG 24 hr tablet Take 30 mg by mouth daily.   Yes [provider]  metolazone (ZAROXOLYN) 2.5 MG tablet Take 1 tablet (2.5 mg total) by mouth daily as needed. Home med. Patient taking differently: Take 2.5 mg by mouth daily as needed (excessive fluid retention). 07/03/21  Yes Darlin Priestly, MD  metoprolol succinate (TOPROL-XL) 50 MG 24 hr tablet Take 1 tablet (50 mg total) by mouth daily. 05/10/18  Yes Sharee Holster, NP  nitroGLYCERIN (NITROSTAT) 0.4 MG SL tablet Place 0.4 mg under the tongue every 5 (five) minutes as needed for chest pain.   Yes [provider]  pantoprazole (PROTONIX) 40 MG tablet Take 40 mg by mouth 2 (two) times daily.   Yes [provider]  polyethylene glycol (MIRALAX / GLYCOLAX) 17 g packet Take 17 g by mouth daily. 04/29/21  Yes Leeroy Bock, MD  potassium chloride (MICRO-K) 10 MEQ CR capsule Take 40 mEq by mouth 2 (two) times daily.   Yes [provider]  senna (SENOKOT) 8.6 MG TABS tablet Take 2 tablets by mouth 2 (two) times daily.   Yes [provider]  sertraline (ZOLOFT) 25 MG tablet Take 25 mg by mouth daily.   Yes [provider]  simethicone (MYLICON) 125 MG chewable tablet Chew 250 mg by mouth in the morning.   Yes [provider]  simethicone (MYLICON) 125 MG chewable tablet Chew 250 mg by mouth daily as needed for flatulence.   Yes [provider]  sodium chloride  (OCEAN) 0.65 % SOLN nasal spray Place 2 sprays into both nostrils every 6 (six) hours as needed for congestion.   Yes [provider]  sucralfate (CARAFATE) 1 g tablet Take 1 g by mouth 4 (four) times daily -  with meals and at bedtime. 01/29/22 02/28/22 Yes [provider]  torsemide (DEMADEX) 20 MG tablet Take 40 mg by mouth 2 (two) times daily.   Yes [provider]   Physical Exam: Vitals:   02/05/22 1134 02/05/22 1400 02/05/22 1700 02/05/22 1900  BP: 123/61 (!) 109/53 126/61 (!) 126/44  Pulse: 72 64 64 73  Resp: 17 (!) 21 (!) 24 20  Temp: 97.8  F (36.6 C)  98 F (36.7 C)   TempSrc: Oral  Oral   SpO2: 100% 100% 99% 100%   Constitutional: appears age-appropriate, frail, NAD, calm, comfortable Eyes: PERRL, lids and conjunctivae normal ENMT: Mucous membranes are moist. Posterior pharynx clear of any exudate or lesions. Age-appropriate dentition. Hearing appropriate Neck: normal, supple, no masses, no thyromegaly Respiratory: clear to auscultation bilaterally, no wheezing, no crackles. Normal respiratory effort. No accessory muscle use.  Cardiovascular: Regular rate and rhythm, no murmurs / rubs / gallops. No extremity edema. 2+ pedal pulses. No carotid bruits.  Abdomen: no tenderness, no masses palpated, no hepatosplenomegaly. Bowel sounds positive.  Musculoskeletal: no clubbing / cyanosis. No joint deformity upper and lower extremities. Good ROM, no contractures, no atrophy. Normal muscle tone.  Skin: no rashes, lesions, ulcers. No induration Neurologic: Sensation intact. Strength 5/5 in all 4.  Psychiatric: Normal judgment and insight. Alert and oriented x 3. Normal mood.   EKG: independently reviewed, showing junctional rhythm with rate of 72, QTc 502, right bundle branch block.  Chest x-ray on Admission: I personally reviewed and I agree with radiologist reading as below.  DG Chest Portable 1 View  Result Date: 02/05/2022 CLINICAL DATA:  Shortness of  breath EXAM: PORTABLE CHEST 1 VIEW COMPARISON:  Chest x-ray dated Aug 12, 2021 FINDINGS: Unchanged cardiomegaly. Left chest wall single lead pacer is unchanged in position. Mild bilateral interstitial opacities, slightly increased when compared to prior exam. No large pleural effusion or pneumothorax. IMPRESSION: Mild bilateral interstitial opacities, likely due to pulmonary edema. Electronically Signed   By: Allegra Lai M.D.   On: 02/05/2022 12:37    Labs on Admission: I have personally reviewed following labs  CBC: Recent Labs  Lab 02/05/22 1134  WBC 6.1  NEUTROABS 4.7  HGB 8.5*  HCT 26.9*  MCV 81.3  PLT 284   Basic Metabolic Panel: Recent Labs  Lab 02/05/22 1134  NA 138  K 3.5  CL 104  CO2 28  GLUCOSE 201*  BUN 28*  CREATININE 1.04*  CALCIUM 9.4   GFR: CrCl cannot be calculated (Unknown ideal weight.).  Liver Function Tests: Recent Labs  Lab 02/05/22 1134  AST 19  ALT 13  ALKPHOS 51  BILITOT 0.8  PROT 6.5  ALBUMIN 3.4*   Recent Labs  Lab 02/05/22 1134  LIPASE 51   Urine analysis:    Component Value Date/Time   COLORURINE STRAW (A) 02/05/2022 1135   APPEARANCEUR CLEAR (A) 02/05/2022 1135   LABSPEC 1.006 02/05/2022 1135   PHURINE 5.0 02/05/2022 1135   GLUCOSEU NEGATIVE 02/05/2022 1135   HGBUR MODERATE (A) 02/05/2022 1135   BILIRUBINUR NEGATIVE 02/05/2022 1135   KETONESUR NEGATIVE 02/05/2022 1135   PROTEINUR NEGATIVE 02/05/2022 1135   NITRITE NEGATIVE 02/05/2022 1135   LEUKOCYTESUR SMALL (A) 02/05/2022 1135   Dr. Sedalia Muta [facility]  If 7PM-7AM, please contact overnight-coverage provider If 7AM-7PM, please contact day coverage provider [email or web address]  02/05/2022, 8:20 PM

## 2022-02-05 NOTE — Assessment & Plan Note (Addendum)
Presumptive diagnosis is iron deficiency anemia, anemia panel has been ordered - Patient's baseline hemoglobin is 10.6-11.4 about 6 months ago - One-time dose of Venofer 200 mg IV ordered - CBC in a.m.

## 2022-02-05 NOTE — Hospital Course (Addendum)
[name] is a 88 year old female with history of sick sinus syndrome, hypertension, hyperlipidemia, insulin-dependent diabetes mellitus, depression, dementia, GERD, chronic constipation, who presents emergency department from [facility] for chief concerns of shortness of breath.  Initial vitals in the emergency department showed temperature of 97.8, respiration rate of 17, heart rate of 72, blood pressure 123/61, SPO2 100% on room air.  Serum sodium is 138, potassium 3.5, chloride 104, bicarb 28, BUN of 28, serum creatinine 1.04, GFR 49, nonfasting blood glucose 201, high sensitive troponin was 10, WBC was 6.1, hemoglobin 8.5, platelets of 284.  UA was positive for small leukocytes.  COVID/influenza A/influenza B PCR were negative.  ED treatment: Furosemide 40 mg IV one-time dose.  11/3: Patient remained hemodynamically stable.  Hemoglobin at 8.2 which is decreased from her baseline.  Anemia panel with anemia of chronic disease with some iron deficiency and B12 at borderline.  Patient was on Eliquis without any obvious bleeding.  Occasionally gets mild bleeding from her hemorrhoids.  Had a normal bowel movement in the hospital. She was started on iron and B12 supplement. Significant hypokalemia with IV diuresis which was repleted. Patient does have lower extremity edema and takes metolazone and torsemide at home which she should continue.  She should continue with her compression stockings.  Patient should continue with current medications and need to have a close follow-up with her providers for further recommendations.  Patient is frail elderly lady, high risk for mortality based on age and underlying comorbidities.

## 2022-02-05 NOTE — Assessment & Plan Note (Addendum)
-   With elevated BNP - Presumptive diagnosis is secondary to fluid overload complicated by iron deficiency anemia - Strict I's and O's - Status post furosemide 40 mg IV one-time dose - I ordered furosemide 40 mg IV twice daily, 2 doses ordered for 02/06/2022

## 2022-02-05 NOTE — ED Notes (Signed)
Pt cleaned up, put in brief, chux changed, and pt adjusted in bed.

## 2022-02-05 NOTE — Discharge Instructions (Addendum)
Your evaluation reveals increased fluid due to your heart failure.  Increase your torsemide for the next 3 days by taking your usual dose two times a day instead once a day.  Follow up with cardiology or the heart failure clinic for further evaluation.  Return to the ER if your shortness of breath gets worse.

## 2022-02-05 NOTE — Assessment & Plan Note (Signed)
-   Metoprolol succinate 50 mg daily, isosorbide mononitrate 30 mg daily resumed

## 2022-02-06 DIAGNOSIS — R0609 Other forms of dyspnea: Secondary | ICD-10-CM

## 2022-02-06 LAB — CBC
HCT: 25.9 % — ABNORMAL LOW (ref 36.0–46.0)
Hemoglobin: 8.2 g/dL — ABNORMAL LOW (ref 12.0–15.0)
MCH: 25.4 pg — ABNORMAL LOW (ref 26.0–34.0)
MCHC: 31.7 g/dL (ref 30.0–36.0)
MCV: 80.2 fL (ref 80.0–100.0)
Platelets: 291 10*3/uL (ref 150–400)
RBC: 3.23 MIL/uL — ABNORMAL LOW (ref 3.87–5.11)
RDW: 14.6 % (ref 11.5–15.5)
WBC: 5.1 10*3/uL (ref 4.0–10.5)
nRBC: 0 % (ref 0.0–0.2)

## 2022-02-06 LAB — BASIC METABOLIC PANEL
Anion gap: 7 (ref 5–15)
BUN: 23 mg/dL (ref 8–23)
CO2: 30 mmol/L (ref 22–32)
Calcium: 8.9 mg/dL (ref 8.9–10.3)
Chloride: 106 mmol/L (ref 98–111)
Creatinine, Ser: 0.87 mg/dL (ref 0.44–1.00)
GFR, Estimated: >60 mL/min (ref >60)
Glucose, Bld: 99 mg/dL (ref 70–99)
Potassium: 2.6 mmol/L — CL (ref 3.5–5.1)
Sodium: 143 mmol/L (ref 135–145)

## 2022-02-06 LAB — MAGNESIUM: Magnesium: 1.8 mg/dL (ref 1.7–2.4)

## 2022-02-06 LAB — GLUCOSE, CAPILLARY
Glucose-Capillary: 83 mg/dL (ref 70–99)
Glucose-Capillary: 203 mg/dL — ABNORMAL HIGH (ref 70–99)
Glucose-Capillary: 81 mg/dL (ref 70–99)
Glucose-Capillary: 91 mg/dL (ref 70–99)

## 2022-02-06 LAB — HEMOGLOBIN A1C
Hgb A1c MFr Bld: 8.9 % — ABNORMAL HIGH (ref 4.8–5.6)
Mean Plasma Glucose: 208.73 mg/dL

## 2022-02-06 LAB — POTASSIUM: Potassium: 3.6 mmol/L (ref 3.5–5.1)

## 2022-02-06 MED ORDER — POTASSIUM CHLORIDE 10 MEQ/100ML IV SOLN
10.0000 meq | INTRAVENOUS | Status: DC
  Administered 2022-02-06: 10 meq via INTRAVENOUS
  Filled 2022-02-06 (×8): qty 100

## 2022-02-06 MED ORDER — INSULIN GLARGINE-YFGN 100 UNIT/ML ~~LOC~~ SOLN
12.0000 [IU] | Freq: Two times a day (BID) | SUBCUTANEOUS | Status: DC
  Administered 2022-02-06: 12 [IU] via SUBCUTANEOUS
  Filled 2022-02-06 (×2): qty 0.12

## 2022-02-06 MED ORDER — MAGNESIUM SULFATE 2 GM/50ML IV SOLN
2.0000 g | Freq: Once | INTRAVENOUS | Status: AC
  Administered 2022-02-06: 2 g via INTRAVENOUS
  Filled 2022-02-06: qty 50

## 2022-02-06 MED ORDER — FERROUS SULFATE 325 (65 FE) MG PO TABS: 325.0000 mg | ORAL_TABLET | Freq: Two times a day (BID) | ORAL | Status: DC

## 2022-02-06 MED ORDER — VITAMIN B-12 1000 MCG PO TABS
1000.0000 ug | ORAL_TABLET | Freq: Every day | ORAL | Status: DC
  Administered 2022-02-06: 1000 ug via ORAL
  Filled 2022-02-06: qty 1

## 2022-02-06 MED ORDER — VITAMIN B-12 1000 MCG PO TABS: 1000.0000 ug | ORAL_TABLET | Freq: Every day | ORAL | 1 refills | Status: DC

## 2022-02-06 MED ORDER — POTASSIUM CHLORIDE CRYS ER 20 MEQ PO TBCR
40.0000 meq | EXTENDED_RELEASE_TABLET | Freq: Once | ORAL | Status: DC
  Filled 2022-02-06: qty 2

## 2022-02-06 MED ORDER — POTASSIUM CHLORIDE 20 MEQ PO PACK
40.0000 meq | PACK | Freq: Once | ORAL | Status: AC
  Administered 2022-02-06: 40 meq via ORAL
  Filled 2022-02-06: qty 2

## 2022-02-06 MED ORDER — FERROUS SULFATE 325 (65 FE) MG PO TBEC: 325.0000 mg | DELAYED_RELEASE_TABLET | Freq: Two times a day (BID) | ORAL | 3 refills | Status: DC

## 2022-02-06 MED ORDER — POTASSIUM CHLORIDE 10 MEQ/100ML IV SOLN
10.0000 meq | INTRAVENOUS | Status: AC
  Administered 2022-02-06 (×2): 10 meq via INTRAVENOUS
  Filled 2022-02-06 (×7): qty 100

## 2022-02-06 NOTE — Discharge Summary (Signed)
Physician Discharge Summary   Patient: Elizabeth Novak MRN: [id number] DOB: August 03, 1923  Admit date:     02/05/2022  Discharge date: 02/06/22  Discharge Physician: Arnetha Courser   PCP: Diona Browner [facility]   Recommendations at discharge:  Please obtain CBC and BMP in the next week Follow-up with primary care provider within a week Follow-up with cardiology  Discharge Diagnoses: Principal Problem:   Dyspnea on exertion Active Problems:   Atrial fibrillation (HCC)   Sick sinus syndrome (HCC)   Hypertension associated with diabetes (HCC)   Anxiety with depression   History of CVA (cerebrovascular accident)   S/P placement of cardiac pacemaker   HTN (hypertension)   HLD (hyperlipidemia)   Insulin dependent type 2 diabetes mellitus (HCC)   Acute on chronic anemia   Hospital Course: Elizabeth Novak is a 88 year old female with history of sick sinus syndrome, hypertension, hyperlipidemia, insulin-dependent diabetes mellitus, depression, dementia, GERD, chronic constipation, who presents emergency department from [facility] for chief concerns of shortness of breath.  Initial vitals in the emergency department showed temperature of 97.8, respiration rate of 17, heart rate of 72, blood pressure 123/61, SPO2 100% on room air.  Serum sodium is 138, potassium 3.5, chloride 104, bicarb 28, BUN of 28, serum creatinine 1.04, GFR 49, nonfasting blood glucose 201, high sensitive troponin was 10, WBC was 6.1, hemoglobin 8.5, platelets of 284.  UA was positive for small leukocytes.  COVID/influenza A/influenza B PCR were negative.  ED treatment: Furosemide 40 mg IV one-time dose.  11/3: Patient remained hemodynamically stable.  Hemoglobin at 8.2 which is decreased from her baseline.  Anemia panel with anemia of chronic disease with some iron deficiency and B12 at borderline.  Patient was on Eliquis without any obvious bleeding.  Occasionally gets mild bleeding from  her hemorrhoids.  Had a normal bowel movement in the hospital. She was started on iron and B12 supplement. Significant hypokalemia with IV diuresis which was repleted. Patient does have lower extremity edema and takes metolazone and torsemide at home which she should continue.  She should continue with her compression stockings.  Patient should continue with current medications and need to have a close follow-up with her providers for further recommendations.  Patient is frail elderly lady, high risk for mortality based on age and underlying comorbidities.  Assessment and Plan: * Dyspnea on exertion - With elevated BNP - Presumptive diagnosis is secondary to fluid overload complicated by iron deficiency anemia - Strict I's and O's - Status post furosemide 40 mg IV one-time dose - I ordered furosemide 40 mg IV twice daily, 2 doses ordered for 02/06/2022  Acute on chronic anemia Presumptive diagnosis is iron deficiency anemia, anemia panel has been ordered - Patient's baseline hemoglobin is 10.6-11.4 about 6 months ago - One-time dose of Venofer 200 mg IV ordered - CBC in a.m.  Insulin dependent type 2 diabetes mellitus (HCC) - Insulin long-acting glargine 9 units subcutaneous twice daily resumed - Insulin SSI with agents coverage ordered  HTN (hypertension) - Metoprolol succinate 50 mg daily, isosorbide mononitrate 30 mg daily resumed  Anxiety with depression - Resumed home sertraline 25 mg daily   Consultants: None Procedures performed:None  Disposition: Assisted living Diet recommendation:  Discharge Diet Orders (From admission, onward)     Start     Ordered   02/06/22 0000  Diet - low sodium heart healthy        02/06/22 1517  Cardiac and Carb modified diet DISCHARGE MEDICATION: Allergies as of 02/06/2022       Reactions   Codeine Nausea Only   Disopyramide    Other reaction(s): Unknown   Ibuprofen Diarrhea   Iodine    blisters   Metformin And  Related Other (See Comments)   unknown   Norpace [disopyramide Phosphate]    Quinidine    Other reaction(s): Unknown   Terfenadine    Other reaction(s): Unknown   Topiramate    Other reaction(s): Other (See Comments) Hair loss   Verapamil    Other reaction(s): Unknown   Dexamethasone Sodium Phosphate Palpitations        Medication List     TAKE these medications    acetaminophen 325 MG tablet Commonly known as: TYLENOL Take 2 tablets (650 mg total) by mouth every 6 (six) hours as needed for mild pain.   allopurinol 100 MG tablet Commonly known as: ZYLOPRIM Take 100 mg by mouth daily.   apixaban 2.5 MG Tabs tablet Commonly known as: ELIQUIS Take 2.5 mg by mouth 2 (two) times daily.   cyanocobalamin 1000 MCG tablet Commonly known as: VITAMIN B12 Take 1 tablet (1,000 mcg total) by mouth daily.   diclofenac Sodium 1 % Gel Commonly known as: VOLTAREN Apply 2 g topically 3 (three) times daily. (Apply to knees)   digoxin 0.125 MG tablet Commonly known as: LANOXIN Take 1 tablet (0.125 mg total) by mouth daily.   docusate sodium 100 MG capsule Commonly known as: COLACE Take 100 mg by mouth 2 (two) times daily.   ferrous sulfate 325 (65 FE) MG EC tablet Take 1 tablet (325 mg total) by mouth 2 (two) times daily.   glipiZIDE 10 MG tablet Commonly known as: GLUCOTROL Take 10 mg by mouth daily.   hydrocortisone 2.5 % cream Apply 1 Application topically 2 (two) times daily.   insulin glargine-yfgn 100 UNIT/ML injection Commonly known as: SEMGLEE Inject 0.09 mLs (9 Units total) into the skin 2 (two) times daily.   isosorbide mononitrate 30 MG 24 hr tablet Commonly known as: IMDUR Take 30 mg by mouth daily.   metolazone 2.5 MG tablet Commonly known as: ZAROXOLYN Take 1 tablet (2.5 mg total) by mouth daily as needed. Home med. What changed:  reasons to take this additional instructions   metoprolol succinate 50 MG 24 hr tablet Commonly known as:  TOPROL-XL Take 1 tablet (50 mg total) by mouth daily.   nitroGLYCERIN 0.4 MG SL tablet Commonly known as: NITROSTAT Place 0.4 mg under the tongue every 5 (five) minutes as needed for chest pain.   pantoprazole 40 MG tablet Commonly known as: PROTONIX Take 40 mg by mouth 2 (two) times daily.   polyethylene glycol 17 g packet Commonly known as: MIRALAX / GLYCOLAX Take 17 g by mouth daily.   potassium chloride 10 MEQ CR capsule Commonly known as: MICRO-K Take 40 mEq by mouth 2 (two) times daily.   senna 8.6 MG Tabs tablet Commonly known as: SENOKOT Take 2 tablets by mouth 2 (two) times daily.   sertraline 25 MG tablet Commonly known as: ZOLOFT Take 25 mg by mouth daily.   simethicone 125 MG chewable tablet Commonly known as: MYLICON Chew 250 mg by mouth daily as needed for flatulence. What changed: Another medication with the same name was removed. Continue taking this medication, and follow the directions you see here.   sodium chloride 0.65 % Soln nasal spray Commonly known as: OCEAN Place 2 sprays into both nostrils  every 6 (six) hours as needed for congestion.   sucralfate 1 g tablet Commonly known as: CARAFATE Take 1 g by mouth 4 (four) times daily -  with meals and at bedtime.   torsemide 20 MG tablet Commonly known as: DEMADEX Take 40 mg by mouth 2 (two) times daily.   Vitamin D 50 MCG (2000 UT) tablet Take 2,000 Units by mouth daily.        Follow-up Information     [facility]. Schedule an appointment as soon as possible for a visit .   Specialty: Cardiology Contact information: [address] [place] [address] [phone]        [facility] .   Specialty: Emergency Medicine Why: If symptoms worsen Contact information: [id number] [place] [address] [phone]        Arsenio Loader, [facility]  [facility]. Schedule an appointment as soon as possible for a visit in 1 week(s).   Contact information: [address] [place] [address] [phone]                Discharge Exam: There were no vitals filed for this visit. General. Frail elderly lady, In no acute distress. Pulmonary.  Lungs clear bilaterally, normal respiratory effort. CV.  Regular rate and rhythm, no JVD, rub or murmur. Abdomen.  Soft, nontender, nondistended, BS positive. CNS.  Alert and oriented .  No focal neurologic deficit. Extremities.  1+ LE edema, no cyanosis, pulses intact and symmetrical. Psychiatry.  Judgment and insight appears normal.   Condition at discharge: stable  The results of significant diagnostics from this hospitalization (including imaging, microbiology, ancillary and laboratory) are listed below for reference.   Imaging Studies: DG Chest Portable 1 View  Result Date: 02/05/2022 CLINICAL DATA:  Shortness of breath EXAM: PORTABLE CHEST 1 VIEW COMPARISON:  Chest x-ray dated Aug 12, 2021 FINDINGS: Unchanged cardiomegaly. Left chest wall single lead pacer is unchanged in position. Mild bilateral interstitial opacities, slightly increased when compared to prior exam. No large pleural effusion or pneumothorax. IMPRESSION: Mild bilateral interstitial opacities, likely due to pulmonary edema. Electronically Signed   By: Allegra Lai M.D.   On: 02/05/2022 12:37    Microbiology: Results for orders placed or performed during the hospital encounter of 02/05/22  Resp Panel by RT-PCR (Flu A&B, Covid) Urine, Clean Catch     Status: None   Collection Time: 02/05/22 11:34 AM   Specimen: Urine, Clean Catch; Nasal Swab  Result Value Ref Range Status   SARS Coronavirus 2 by RT PCR NEGATIVE NEGATIVE Final    Comment: (NOTE) SARS-CoV-2 target nucleic acids are NOT DETECTED.  The SARS-CoV-2 RNA is generally detectable in upper respiratory specimens during the acute  phase of infection. The lowest concentration of SARS-CoV-2 viral copies this assay can detect is 138 copies/mL. A negative result does not preclude SARS-Cov-2 infection and should not be used as the sole basis for treatment or other patient management decisions. A negative result may occur with  improper specimen collection/handling, submission of specimen other than nasopharyngeal swab, presence of viral mutation(s) within the areas targeted by this assay, and inadequate number of viral copies(<138 copies/mL). A negative result must be combined with clinical observations, patient history, and epidemiological information. The expected result is Negative.  Fact Sheet for Patients:  BloggerCourse.com  Fact Sheet for Healthcare Providers:  SeriousBroker.it  This test is no t yet approved or cleared  by the [place] and  has been authorized for detection and/or diagnosis of SARS-CoV-2 by FDA under an Emergency Use Authorization (EUA). This EUA will remain  in effect (meaning this test can be used) for the duration of the COVID-19 declaration under Section 564(b)(1) of the Act, 21 U.S.C.section 360bbb-3(b)(1), unless the authorization is terminated  or revoked sooner.       Influenza A by PCR NEGATIVE NEGATIVE Final   Influenza B by PCR NEGATIVE NEGATIVE Final    Comment: (NOTE) The Xpert Xpress SARS-CoV-2/FLU/RSV plus assay is intended as an aid in the diagnosis of influenza from Nasopharyngeal swab specimens and should not be used as a sole basis for treatment. Nasal washings and aspirates are unacceptable for Xpert Xpress SARS-CoV-2/FLU/RSV testing.  Fact Sheet for Patients: BloggerCourse.com  Fact Sheet for Healthcare Providers: SeriousBroker.it  This test is not yet approved or cleared by the [place] FDA and has been authorized for detection and/or diagnosis of  SARS-CoV-2 by FDA under an Emergency Use Authorization (EUA). This EUA will remain in effect (meaning this test can be used) for the duration of the COVID-19 declaration under Section 564(b)(1) of the Act, 21 U.S.C. section 360bbb-3(b)(1), unless the authorization is terminated or revoked.  Performed at [facility], [address] [place], [place] [address]     Labs: CBC: Recent Labs  Lab 02/05/22 1134 02/06/22 0419  WBC 6.1 5.1  NEUTROABS 4.7  --   HGB 8.5* 8.2*  HCT 26.9* 25.9*  MCV 81.3 80.2  PLT 284 291   Basic Metabolic Panel: Recent Labs  Lab 02/05/22 1134 02/06/22 0419 02/06/22 1405  NA 138 143  --   K 3.5 2.6* 3.6  CL 104 106  --   CO2 28 30  --   GLUCOSE 201* 99  --   BUN 28* 23  --   CREATININE 1.04* 0.87  --   CALCIUM 9.4 8.9  --   MG  --  1.8  --    Liver Function Tests: Recent Labs  Lab 02/05/22 1134  AST 19  ALT 13  ALKPHOS 51  BILITOT 0.8  PROT 6.5  ALBUMIN 3.4*   CBG: Recent Labs  Lab 02/05/22 2149 02/06/22 0719 02/06/22 1139  GLUCAP 246* 83 203*    Discharge time spent: greater than 30 minutes.  This record has been created using Conservation officer, historic buildings. Errors have been sought and corrected,but may not always be located. Such creation errors do not reflect on the standard of care.   Signed: Arnetha Courser, MD [facility] 02/06/2022

## 2022-02-06 NOTE — TOC Transition Note (Addendum)
Transition of Care [facility]) - CM/SW Discharge Note   Patient Details  Name: Elizabeth Novak MRN: [id number] Date of Birth: 1923-04-28  [facility] of [facility] CM/SW Contact:  Candie Chroman, LCSW Phone Number: 02/06/2022, 3:46 PM   Clinical Narrative:  Patient has orders to discharge back to [place] today. Notified Linda at [facility] this morning. Faxed discharge summary and left Vaughan Basta a voicemail asking her to call back once reviewed. Patient was active with [facility] for RN prior to admission. They will resume services at discharge. Son requested EMS transport back to the facility. Will arrange once confirmation received from facility.   4:12 pm: EMS transport has been arranged. She is 3rd on the list. [facility] called and said they are no longer active so they will need a new HH order for nursing. Sent secure chat to MD. No further concerns. CSW signing off.  Final next level of care: Assisted Living (with home health) Barriers to Discharge: Barriers Resolved   Patient Goals and CMS Choice     Choice offered to / list presented to : NA  Discharge Placement                Patient to be transferred to facility by: EMS Name of family member notified: Alric Quan Patient and family notified of of transfer: 02/06/22  Discharge Plan and Services                          HH Arranged: RN [facility] Agency: [place] Date [place]: 02/06/22   Representative spoke with at [place]: Penni Homans  Social Determinants of Health (SDOH) Interventions     Readmission Risk Interventions     No data to display

## 2022-02-07 LAB — RETICULOCYTES

## 2022-02-09 NOTE — Progress Notes (Unsigned)
Patient ID: Aleasa Draughn, female    DOB: Jan 01, 1924, 88 y.o.   MRN: [id number]  HPI  Ms Botero is a 88 y/o female with a history of  Echo report from 07/26/21 reviewed and showed an EF of 65-70% along with severe LAE/ RAE and severe MR/ TR with prolapsed mitral valve.   Admitted 02/05/22 due to sudden SOB. Initially given IV lasix with transition to oral diuretics. Hypokalemia corrected. Started on iron and B12. One dose of venofer given. Discharged the following day.   She presents today for her initial visit with a chief complaint of   Review of Systems    Physical Exam    Assessment & Plan:  1: Chronic heart failure with preserved ejection fraction with structural changes (LAE)- - NYHA class - on GDMT of  - saw cardiology (Fath) 10/23/21 - BNP 02/05/22 was 256.9  2: HTN- - BP - saw PCP Hyacinth Meeker) 04/22/21; now seeing PCP at [facility] - BMP 02/06/22 reviewed and showed sodium 143, potassium 2.6 (repeat was 3.6), creatinine 0.87 & GFR >60 - repeat BMP today  3: IDDM with CKD- - saw nephrology (Korrapati) 11/20/21 - A1c 02/06/22 was 8.9%  4: Anemia- - saw GI Kathryne Hitch) 01/21/22 - hemoglobin 02/06/22 was 8.2  5: Anxiety with depression- - palliative care visit done 02/04/22

## 2022-02-10 ENCOUNTER — Encounter: Payer: Self-pay | Admitting: Family

## 2022-02-10 ENCOUNTER — Other Ambulatory Visit
Admission: RE | Admit: 2022-02-10 | Discharge: 2022-02-10 | Disposition: A | Discharge status: Home / Self Care | Payer: Medicare Other | Source: Ambulatory Visit | Attending: Family | Admitting: Family

## 2022-02-10 ENCOUNTER — Encounter: Payer: Self-pay | Admitting: Pharmacist

## 2022-02-10 ENCOUNTER — Telehealth: Payer: Self-pay | Admitting: Family

## 2022-02-10 ENCOUNTER — Ambulatory Visit (HOSPITAL_BASED_OUTPATIENT_CLINIC_OR_DEPARTMENT_OTHER): Payer: Medicare Other | Admitting: Family

## 2022-02-10 VITALS — BP 121/55 | HR 71 | Resp 16 | Ht 64.0 in | Wt 112.2 lb

## 2022-02-10 DIAGNOSIS — E1122 Type 2 diabetes mellitus with diabetic chronic kidney disease: Secondary | ICD-10-CM | POA: Insufficient documentation

## 2022-02-10 DIAGNOSIS — R5383 Other fatigue: Secondary | ICD-10-CM | POA: Insufficient documentation

## 2022-02-10 DIAGNOSIS — Z794 Long term (current) use of insulin: Secondary | ICD-10-CM | POA: Insufficient documentation

## 2022-02-10 DIAGNOSIS — I10 Essential (primary) hypertension: Secondary | ICD-10-CM

## 2022-02-10 DIAGNOSIS — I5033 Acute on chronic diastolic (congestive) heart failure: Secondary | ICD-10-CM

## 2022-02-10 DIAGNOSIS — R0789 Other chest pain: Secondary | ICD-10-CM | POA: Insufficient documentation

## 2022-02-10 DIAGNOSIS — E876 Hypokalemia: Secondary | ICD-10-CM | POA: Insufficient documentation

## 2022-02-10 DIAGNOSIS — N189 Chronic kidney disease, unspecified: Secondary | ICD-10-CM | POA: Insufficient documentation

## 2022-02-10 DIAGNOSIS — Z8673 Personal history of transient ischemic attack (TIA), and cerebral infarction without residual deficits: Secondary | ICD-10-CM | POA: Insufficient documentation

## 2022-02-10 DIAGNOSIS — Z7984 Long term (current) use of oral hypoglycemic drugs: Secondary | ICD-10-CM | POA: Insufficient documentation

## 2022-02-10 DIAGNOSIS — E785 Hyperlipidemia, unspecified: Secondary | ICD-10-CM | POA: Insufficient documentation

## 2022-02-10 DIAGNOSIS — R002 Palpitations: Secondary | ICD-10-CM | POA: Insufficient documentation

## 2022-02-10 DIAGNOSIS — F418 Other specified anxiety disorders: Secondary | ICD-10-CM | POA: Insufficient documentation

## 2022-02-10 DIAGNOSIS — I70208 Unspecified atherosclerosis of native arteries of extremities, other extremity: Secondary | ICD-10-CM | POA: Diagnosis not present

## 2022-02-10 DIAGNOSIS — R140 Abdominal distension (gaseous): Secondary | ICD-10-CM | POA: Insufficient documentation

## 2022-02-10 DIAGNOSIS — K219 Gastro-esophageal reflux disease without esophagitis: Secondary | ICD-10-CM | POA: Insufficient documentation

## 2022-02-10 DIAGNOSIS — I130 Hypertensive heart and chronic kidney disease with heart failure and stage 1 through stage 4 chronic kidney disease, or unspecified chronic kidney disease: Secondary | ICD-10-CM | POA: Insufficient documentation

## 2022-02-10 DIAGNOSIS — E119 Type 2 diabetes mellitus without complications: Secondary | ICD-10-CM

## 2022-02-10 DIAGNOSIS — D649 Anemia, unspecified: Secondary | ICD-10-CM

## 2022-02-10 DIAGNOSIS — Z853 Personal history of malignant neoplasm of breast: Secondary | ICD-10-CM | POA: Insufficient documentation

## 2022-02-10 DIAGNOSIS — I341 Nonrheumatic mitral (valve) prolapse: Secondary | ICD-10-CM | POA: Insufficient documentation

## 2022-02-10 LAB — BASIC METABOLIC PANEL
Anion gap: 6 (ref 5–15)
BUN: 19 mg/dL (ref 8–23)
CO2: 29 mmol/L (ref 22–32)
Calcium: 9.5 mg/dL (ref 8.9–10.3)
Chloride: 104 mmol/L (ref 98–111)
Creatinine, Ser: 0.96 mg/dL (ref 0.44–1.00)
GFR, Estimated: 54 mL/min — ABNORMAL LOW (ref >60)
Glucose, Bld: 133 mg/dL — ABNORMAL HIGH (ref 70–99)
Potassium: 3.7 mmol/L (ref 3.5–5.1)
Sodium: 139 mmol/L (ref 135–145)

## 2022-02-10 LAB — DIGOXIN LEVEL: Digoxin Level: 1.4 ng/mL (ref 0.8–2.0)

## 2022-02-10 LAB — BRAIN NATRIURETIC PEPTIDE: B Natriuretic Peptide: 223.1 pg/mL — ABNORMAL HIGH (ref 0.0–100.0)

## 2022-02-10 NOTE — Telephone Encounter (Signed)
Received BMP results and potassium noted to be 3.7. Currently taking 46mq potassium BID but have ordered metolazone 2.'5mg'$  daily for 2 days. Will give an additional 228m potassium daily for these 2 days in addition to her regular dose. [facility]

## 2022-02-10 NOTE — Patient Instructions (Addendum)
Continue weighing daily and call for an overnight weight gain of 3 pounds or more or a weekly weight gain of more than 5 pounds.   Keep daily fluid intake to 60-64 ounces daily.

## 2022-02-11 ENCOUNTER — Telehealth: Payer: Self-pay

## 2022-02-11 NOTE — Telephone Encounter (Signed)
Spoke with patient's nurse, Vaughan Basta, at [facility] to ensure they had received faxed order from yesterday. 02/10/22, for extra dose of potassium 20 meq for two days, in addition to her normal daily dose.  RN confirmed fax had been received and their pharmacy now had the order.  Georg Ruddle, RN

## 2022-02-11 NOTE — Progress Notes (Signed)
Patient ID: Elizabeth Novak, female   DOB: [birth year], 88 y.o.   MRN: [id number] [facility] - PHARMACIST COUNSELING NOTE  Guideline-Directed Medical Therapy/Evidence Based Medicine  *HFpEF*  ACE/ARB/ARNI: N/A Beta Blocker: Metoprolol succinate 50 mg daily Aldosterone Antagonist:  none Diuretic: Torsemide 20 mg twice daily , metolazone 2.5mg  as needed SGLT2i:  none  Adherence Assessment  Do you ever forget to take your medication? [] Yes [x] No  Do you ever skip doses due to side effects? [] Yes [x] No  Do you have trouble affording your medicines? [] Yes [x] No  Are you ever unable to pick up your medication due to transportation difficulties? [] Yes [x] No  Do you ever stop taking your medications because you don't believe they are helping? [] Yes [x] No  Do you check your weight daily? [x] Yes [] No   Adherence strategy: none  Barriers to obtaining medications: none  Vital signs: HR 71, BP 121/55, weight (pounds) 112 lbs ECHO: Date 07/26/21, EF 65-70%, notes severe LAE/ RAE and severe MR/ TR with prolapsed mitral valve.      Latest Ref Rng & Units 02/10/2022    9:46 AM 02/06/2022    2:05 PM 02/06/2022    4:19 AM  BMP  Glucose 70 - 99 mg/dL 409   99   BUN 8 - 23 mg/dL 19   23   Creatinine 8.11 - 1.00 mg/dL 9.14   7.82   Sodium 956 - 145 mmol/L 139   143   Potassium 3.5 - 5.1 mmol/L 3.7  3.6  2.6  C  Chloride 98 - 111 mmol/L 104   106   CO2 22 - 32 mmol/L 29   30   Calcium 8.9 - 10.3 mg/dL 9.5   8.9     C Corrected result    Past Medical History:  Diagnosis Date   A-fib (HCC)    Anemia    Anxiety    Arthritis    Breast cancer (HCC) 1978   left breast with lymph node removal   CHF (congestive heart failure) (HCC)    Chronic kidney disease    Depression    Diabetes mellitus without complication (HCC)    Diverticulitis    Dysrhythmia    GERD (gastroesophageal reflux disease)    Hyperlipidemia    Hypertension    Macular  degeneration of both eyes    Mitral valve prolapse    Mitral valve regurgitation    Presence of permanent cardiac pacemaker    Stroke [facility])     ASSESSMENT 88 year old female who presents to the HF clinic for initial visit. PMH includes history of breast cancer, DM, hyperlipidemia, HTN, CKD, atrial fibrillation, stroke, anemia, anxiety, depression, GERD, macular degeneration, and HFpEF )ef 65-70%). Recent hospital admission was 02/05/2022 due to sudden onset of SOB. Noted patient had hypokalemia, iron deficiency anemia, and hypervolemia.  Patient in good spirit but SOB, and with low extremity edema. MedRec done with [facility] copy form assisted living facility. Noted no metolazone dose given since discharge.   PLAN  S/sx of fluid overload - need gentle diuresis (metolazone x 2 days plus Kcl replacement) Repeat BMP and digoxin level Consider fluid restriction SGLT2i can be initiated once fluid status and renal function stable Will need ferritin and iron saturation checked in 2-3 weeks   Time spent: 15 minutes  Ramone Gander Rodriguez-Guzman PharmD, BCPS 02/11/2022 7:15 AM    Current Outpatient Medications:    acetaminophen (TYLENOL) 325 MG tablet, Take 2 tablets (650 mg total)  by mouth every 6 (six) hours as needed for mild pain. (Patient not taking: Reported on 02/10/2022), Disp: , Rfl:    allopurinol (ZYLOPRIM) 100 MG tablet, Take 100 mg by mouth daily., Disp: , Rfl:    apixaban (ELIQUIS) 2.5 MG TABS tablet, Take 2.5 mg by mouth 2 (two) times daily., Disp: , Rfl:    Cholecalciferol (VITAMIN D) 50 MCG (2000 UT) tablet, Take 2,000 Units by mouth daily., Disp: , Rfl:    cyanocobalamin (VITAMIN B12) 1000 MCG tablet, Take 1 tablet (1,000 mcg total) by mouth daily., Disp: 30 tablet, Rfl: 1   diclofenac Sodium (VOLTAREN) 1 % GEL, Apply 2 g topically 3 (three) times daily. (Apply to knees), Disp: , Rfl:    digoxin (LANOXIN) 0.125 MG tablet, Take 1 tablet (0.125 mg total) by mouth daily., Disp: , Rfl:     docusate sodium (COLACE) 100 MG capsule, Take 100 mg by mouth 2 (two) times daily., Disp: , Rfl:    ferrous sulfate 325 (65 FE) MG EC tablet, Take 1 tablet (325 mg total) by mouth 2 (two) times daily., Disp: 60 tablet, Rfl: 3   glipiZIDE (GLUCOTROL) 10 MG tablet, Take 10 mg by mouth daily., Disp: , Rfl:    insulin glargine-yfgn (SEMGLEE) 100 UNIT/ML injection, Inject 0.09 mLs (9 Units total) into the skin 2 (two) times daily., Disp: , Rfl:    isosorbide mononitrate (IMDUR) 30 MG 24 hr tablet, Take 30 mg by mouth daily., Disp: , Rfl:    metolazone (ZAROXOLYN) 2.5 MG tablet, Take 1 tablet (2.5 mg total) by mouth daily as needed. Home med. (Patient taking differently: Take 2.5 mg by mouth daily as needed (excessive fluid retention).), Disp: , Rfl:    metoprolol succinate (TOPROL-XL) 50 MG 24 hr tablet, Take 1 tablet (50 mg total) by mouth daily., Disp: 30 tablet, Rfl: 0   nitroGLYCERIN (NITROSTAT) 0.4 MG SL tablet, Place 0.4 mg under the tongue every 5 (five) minutes as needed for chest pain. (Patient not taking: Reported on 02/10/2022), Disp: , Rfl:    pantoprazole (PROTONIX) 40 MG tablet, Take 40 mg by mouth 2 (two) times daily., Disp: , Rfl:    polyethylene glycol (MIRALAX / GLYCOLAX) 17 g packet, Take 17 g by mouth daily., Disp: 14 each, Rfl: 0   potassium chloride (MICRO-K) 10 MEQ CR capsule, Take 40 mEq by mouth 2 (two) times daily., Disp: , Rfl:    senna (SENOKOT) 8.6 MG TABS tablet, Take 2 tablets by mouth 2 (two) times daily., Disp: , Rfl:    sertraline (ZOLOFT) 25 MG tablet, Take 25 mg by mouth daily., Disp: , Rfl:    simethicone (MYLICON) 125 MG chewable tablet, Chew 250 mg by mouth daily as needed for flatulence., Disp: , Rfl:    sodium chloride (OCEAN) 0.65 % SOLN nasal spray, Place 2 sprays into both nostrils every 6 (six) hours as needed for congestion. (Patient not taking: Reported on 02/10/2022), Disp: , Rfl:    sucralfate (CARAFATE) 1 g tablet, Take 1 g by mouth 4 (four) times daily -   with meals and at bedtime., Disp: , Rfl:    torsemide (DEMADEX) 20 MG tablet, Take 40 mg by mouth 2 (two) times daily., Disp: , Rfl:    MEDICATION ADHERENCES TIPS AND STRATEGIES Taking medication as prescribed improves patient outcomes in heart failure (reduces hospitalizations, improves symptoms, increases survival) Side effects of medications can be managed by decreasing doses, switching agents, stopping drugs, or adding additional therapy. Please let someone in  the Heart Failure Clinic know if you have having bothersome side effects so we can modify your regimen. Do not alter your medication regimen without talking to [place].  Medication reminders can help patients remember to take drugs on time. If you are missing or forgetting doses you can try linking behaviors, using pill boxes, or an electronic reminder like an alarm on your phone or an app. Some people can also get automated phone calls as medication reminders.

## 2022-02-11 NOTE — Progress Notes (Unsigned)
Patient ID: Elizabeth Novak, female    DOB: 1923-12-04, 88 y.o.   MRN: [id number]  HPI  Elizabeth Novak is a 88 y/o female with a history of breast cancer, DM, hyperlipidemia, HTN, CKD, atrial fibrillation, stroke, anemia, anxiety, depression, GERD, macular degeneration and chronic heart failure.   Echo report from 07/26/21 reviewed and showed an EF of 65-70% along with severe LAE/ RAE and severe MR/ TR with prolapsed mitral valve.   Admitted 02/05/22 due to sudden SOB. Initially given IV lasix with transition to oral diuretics. Hypokalemia corrected. Started on iron and B12. One dose of venofer given. Discharged the following day.   She presents today for a follow-up visit with a chief complaint of moderate shortness of breath with minimal exertion. Describes this as chronic in nature although she feels like it has improved from when she was last here 2 days ago. She has associated fatigue, pedal edema, occasional palpitations, abdominal distention (improving) and raspy voice along with this. Denies any dizziness, chest pain, cough or weight gain.   Took 2 doses of metolazone with extra potassium and feels better. Has had some difficulty sleeping during this time due to frequent urination.   She is being weighed daily at [facility]   Past Medical History:  Diagnosis Date   A-fib [facility])    Anemia    Anxiety    Arthritis    Breast cancer (HCC) 1978   left breast with lymph node removal   CHF (congestive heart failure) (HCC)    Chronic kidney disease    Depression    Diabetes mellitus without complication (HCC)    Diverticulitis    Dysrhythmia    GERD (gastroesophageal reflux disease)    Hyperlipidemia    Hypertension    Macular degeneration of both eyes    Mitral valve prolapse    Mitral valve regurgitation    Presence of permanent cardiac pacemaker    Stroke [facility])    Past Surgical History:  Procedure Laterality Date   ABDOMINAL HYSTERECTOMY     APPENDECTOMY     BREAST SURGERY      CARDIAC CATHETERIZATION     ESOPHAGEAL DILATION     EYE SURGERY Bilateral    Cataract Extraction with IOL   INSERT / REPLACE / REMOVE PACEMAKER     IRRIGATION AND DEBRIDEMENT HEMATOMA Left 08/21/2015   Procedure: IRRIGATION AND DEBRIDEMENT HEMATOMA;  Surgeon: Elizabeth Mayotte, MD;  Location: [facility] ORS;  Service: General;  Laterality: Left;   KNEE ARTHROSCOPY Right    LEFT OOPHORECTOMY Left    MASTECTOMY Left 1978   MASTECTOMY Right 1978   OPEN REDUCTION INTERNAL FIXATION (ORIF) DISTAL RADIAL FRACTURE Right 02/14/2018   Procedure: OPEN REDUCTION INTERNAL FIXATION (ORIF) DISTAL RADIAL FRACTURE;  Surgeon: Elizabeth Bucker, MD;  Location: [facility] ORS;  Service: Orthopedics;  Laterality: Right;   PACEMAKER INSERTION  08/11/12   PPM GENERATOR CHANGEOUT N/A 05/02/2020   Procedure: PPM GENERATOR CHANGEOUT;  Surgeon: Elizabeth Millard, MD;  Location: [facility] INVASIVE CV LAB;  Service: Cardiovascular;  Laterality: N/A;   TEMPORAL ARTERY BIOPSY / LIGATION     TONSILLECTOMY     Family History  Problem Relation Age of Onset   Hypertension Mother    Social History   Tobacco Use   Smoking status: Never   Smokeless tobacco: Never  Substance Use Topics   Alcohol use: No    Alcohol/week: 0.0 standard drinks of alcohol   Allergies  Allergen Reactions   Codeine Nausea Only  Disopyramide     Other reaction(s): Unknown   Ibuprofen Diarrhea   Iodine     blisters   Metformin And Related Other (See Comments)    unknown   Norpace [Disopyramide Phosphate]    Quinidine     Other reaction(s): Unknown   Terfenadine     Other reaction(s): Unknown   Topiramate     Other reaction(s): Other (See Comments) Hair loss   Verapamil     Other reaction(s): Unknown   Dexamethasone Sodium Phosphate Palpitations   Prior to Admission medications   Medication Sig Start Date End Date Taking? Authorizing Elizabeth  acetaminophen (TYLENOL) 325 MG tablet Take 2 tablets (650 mg total) by mouth every 6 (six) hours as  needed for mild pain. 04/29/21  Yes Elizabeth Bock, MD  allopurinol (ZYLOPRIM) 100 MG tablet Take 100 mg by mouth daily.   Yes Elizabeth, Historical, MD  apixaban (ELIQUIS) 2.5 MG TABS tablet Take 2.5 mg by mouth 2 (two) times daily.   Yes Elizabeth, Historical, MD  Cholecalciferol (VITAMIN D) 50 MCG (2000 UT) tablet Take 2,000 Units by mouth daily.   Yes Elizabeth, Historical, MD  cyanocobalamin (VITAMIN B12) 1000 MCG tablet Take 1 tablet (1,000 mcg total) by mouth daily. 02/06/22  Yes Elizabeth Courser, MD  diclofenac Sodium (VOLTAREN) 1 % GEL Apply 2 g topically 3 (three) times daily. (Apply to knees)   Yes Elizabeth, Historical, MD  digoxin (LANOXIN) 0.125 MG tablet Take 1 tablet (0.125 mg total) by mouth daily. 08/02/21  Yes Elizabeth Shadow, MD  docusate sodium (COLACE) 100 MG capsule Take 100 mg by mouth 2 (two) times daily.   Yes Elizabeth, Historical, MD  ferrous sulfate 325 (65 FE) MG EC tablet Take 1 tablet (325 mg total) by mouth 2 (two) times daily. 02/06/22 02/06/23 Yes Elizabeth Courser, MD  glipiZIDE (GLUCOTROL) 10 MG tablet Take 10 mg by mouth daily.   Yes Elizabeth, Historical, MD  insulin glargine-yfgn (SEMGLEE) 100 UNIT/ML injection Inject 0.09 mLs (9 Units total) into the skin 2 (two) times daily. 08/02/21  Yes Elizabeth Shadow, MD  isosorbide mononitrate (IMDUR) 30 MG 24 hr tablet Take 30 mg by mouth daily.   Yes Elizabeth, Historical, MD  metolazone (ZAROXOLYN) 2.5 MG tablet Take 1 tablet (2.5 mg total) by mouth daily as needed. Home med. Patient taking differently: Take 2.5 mg by mouth daily as needed (excessive fluid retention). 07/03/21  Yes Elizabeth Priestly, MD  metoprolol succinate (TOPROL-XL) 50 MG 24 hr tablet Take 1 tablet (50 mg total) by mouth daily. 05/10/18  Yes Elizabeth Holster, NP  nitroGLYCERIN (NITROSTAT) 0.4 MG SL tablet Place 0.4 mg under the tongue every 5 (five) minutes as needed for chest pain.   Yes Elizabeth, Historical, MD  pantoprazole (PROTONIX) 40 MG tablet Take 40 mg by mouth 2  (two) times daily.   Yes Elizabeth, Historical, MD  polyethylene glycol (MIRALAX / GLYCOLAX) 17 g packet Take 17 g by mouth daily. 04/29/21  Yes Elizabeth Bock, MD  potassium chloride (MICRO-K) 10 MEQ CR capsule Take 40 mEq by mouth 2 (two) times daily.   Yes Elizabeth, Historical, MD  senna (SENOKOT) 8.6 MG TABS tablet Take 2 tablets by mouth 2 (two) times daily.   Yes Elizabeth, Historical, MD  sertraline (ZOLOFT) 25 MG tablet Take 25 mg by mouth daily.   Yes Elizabeth, Historical, MD  simethicone (MYLICON) 125 MG chewable tablet Chew 250 mg by mouth daily as needed for flatulence.   Yes Elizabeth, Historical, MD  sodium chloride (OCEAN) 0.65 % SOLN nasal spray Place 2 sprays into both nostrils every 6 (six) hours as needed for congestion.   Yes Elizabeth, Historical, MD  sucralfate (CARAFATE) 1 g tablet Take 1 g by mouth 4 (four) times daily -  with meals and at bedtime. 01/29/22 02/28/22 Yes Elizabeth, Historical, MD  torsemide (DEMADEX) 20 MG tablet Take 40 mg by mouth 2 (two) times daily.   Yes Elizabeth, Historical, MD   Review of Systems  Constitutional:  Positive for appetite change (decreased) and fatigue.  HENT:  Positive for hearing loss and voice change (raspy). Negative for congestion, postnasal drip and sore throat.   Eyes: Negative.   Respiratory:  Positive for shortness of breath (easily). Negative for cough and chest tightness.   Cardiovascular:  Positive for palpitations and leg swelling. Negative for chest pain.  Gastrointestinal:  Positive for abdominal distention ("better"). Negative for abdominal pain.  Endocrine: Negative.   Genitourinary: Negative.   Musculoskeletal:  Negative for back pain and neck pain.  Skin: Negative.   Allergic/Immunologic: Negative.   Neurological:  Negative for dizziness and light-headedness.  Hematological:  Negative for adenopathy. Does not bruise/bleed easily.  Psychiatric/Behavioral:  Positive for sleep disturbance (waking up frequently due to  urination; sleeping with HOB up due to SOB). Negative for dysphoric mood. The patient is not nervous/anxious.    Vitals:   02/12/22 0933  BP: (!) 111/55  Pulse: 94  Resp: 16  SpO2: 96%  Weight: 109 lb (49.4 kg)   Wt Readings from Last 3 Encounters:  02/12/22 109 lb (49.4 kg)  02/10/22 112 lb 4 oz (50.9 kg)  08/02/21 102 lb 12.8 oz (46.6 kg)   Lab Results  Component Value Date   CREATININE 0.96 02/10/2022   CREATININE 0.87 02/06/2022   CREATININE 1.04 (H) 02/05/2022   Physical Exam Vitals and nursing note reviewed. Exam conducted with a chaperone present (facility employee).  Constitutional:      Appearance: Normal appearance.  HENT:     Head: Normocephalic and atraumatic.     Right Ear: Decreased hearing noted.     Left Ear: Decreased hearing noted.  Cardiovascular:     Rate and Rhythm: Normal rate and regular rhythm.  Pulmonary:     Effort: Pulmonary effort is normal. No respiratory distress.     Breath sounds: No wheezing or rales.  Abdominal:     General: There is distension (less distended).     Palpations: Abdomen is soft.  Musculoskeletal:        General: No tenderness.     Cervical back: Normal range of motion and neck supple.     Right lower leg: No tenderness. Edema (2+ pitting) present.     Left lower leg: No tenderness. Edema (2+ pitting) present.  Skin:    General: Skin is warm and dry.  Neurological:     General: No focal deficit present.     Mental Status: She is alert and oriented to person, place, and time.  Psychiatric:        Mood and Affect: Mood normal.        Behavior: Behavior normal.        Thought Content: Thought content normal.    Assessment & Plan:  1: Chronic heart failure with preserved ejection fraction with structural changes (LAE)- - NYHA class III - still fluid overloaded although improved with improvement of SOB/ abdominal distention - being weighed daily and order in place to give metolazone 2.5mg  for overnight weight  gain  of > 3 pounds or weekly weight gain of > 5 pounds  - weight down 3.4 pounds from last visit here 2 days ago - recheck BMP today since she took 2 doses of metolazone with extra potassium since last here - will add jardiance 10mg  daily - check BMP next visit - saw cardiology (Fath) 10/23/21 - she brought her cup that she normally drinks out of and it's a 12 ounces styrofoam cup; explained that she could have 5 of these cups of liquid daily - wearing compression socks daily with removal at bedtime - dig level 02/10/22 was 1.4 - BNP 02/10/22 was 223.1 - PharmD reconciled medications with the patient  2: HTN- - BP looks good (111/55) - saw PCP Hyacinth Meeker) 04/22/21; now seeing PCP at [facility] - BMP 02/10/22 reviewed and showed sodium 139, potassium 3.7, creatinine 0.96 & GFR 54 - 3: IDDM with CKD- - saw nephrology (Korrapati) 11/20/21 - A1c 02/06/22 was 8.9%  4: Anemia- - saw GI Kathryne Hitch) 01/21/22 - hemoglobin 02/06/22 was 8.2  5: Anxiety with depression- - palliative care visit done 02/04/22   Medication list reviewed.   Return in 1 month, sooner if needed.

## 2022-02-12 ENCOUNTER — Encounter: Payer: Self-pay | Admitting: Family

## 2022-02-12 ENCOUNTER — Ambulatory Visit: Payer: Medicare Other | Attending: Family | Admitting: Family

## 2022-02-12 VITALS — BP 111/55 | HR 94 | Resp 16 | Wt 109.0 lb

## 2022-02-12 DIAGNOSIS — I341 Nonrheumatic mitral (valve) prolapse: Secondary | ICD-10-CM | POA: Insufficient documentation

## 2022-02-12 DIAGNOSIS — R0602 Shortness of breath: Secondary | ICD-10-CM | POA: Diagnosis present

## 2022-02-12 DIAGNOSIS — D649 Anemia, unspecified: Secondary | ICD-10-CM

## 2022-02-12 DIAGNOSIS — E1122 Type 2 diabetes mellitus with diabetic chronic kidney disease: Secondary | ICD-10-CM | POA: Diagnosis not present

## 2022-02-12 DIAGNOSIS — I4891 Unspecified atrial fibrillation: Secondary | ICD-10-CM | POA: Diagnosis not present

## 2022-02-12 DIAGNOSIS — I130 Hypertensive heart and chronic kidney disease with heart failure and stage 1 through stage 4 chronic kidney disease, or unspecified chronic kidney disease: Secondary | ICD-10-CM | POA: Insufficient documentation

## 2022-02-12 DIAGNOSIS — I70208 Unspecified atherosclerosis of native arteries of extremities, other extremity: Secondary | ICD-10-CM | POA: Diagnosis not present

## 2022-02-12 DIAGNOSIS — R002 Palpitations: Secondary | ICD-10-CM | POA: Insufficient documentation

## 2022-02-12 DIAGNOSIS — Z794 Long term (current) use of insulin: Secondary | ICD-10-CM | POA: Insufficient documentation

## 2022-02-12 DIAGNOSIS — F418 Other specified anxiety disorders: Secondary | ICD-10-CM | POA: Diagnosis not present

## 2022-02-12 DIAGNOSIS — E785 Hyperlipidemia, unspecified: Secondary | ICD-10-CM | POA: Insufficient documentation

## 2022-02-12 DIAGNOSIS — I5032 Chronic diastolic (congestive) heart failure: Secondary | ICD-10-CM | POA: Diagnosis not present

## 2022-02-12 DIAGNOSIS — R140 Abdominal distension (gaseous): Secondary | ICD-10-CM | POA: Insufficient documentation

## 2022-02-12 DIAGNOSIS — D631 Anemia in chronic kidney disease: Secondary | ICD-10-CM | POA: Diagnosis not present

## 2022-02-12 DIAGNOSIS — I10 Essential (primary) hypertension: Secondary | ICD-10-CM

## 2022-02-12 DIAGNOSIS — E119 Type 2 diabetes mellitus without complications: Secondary | ICD-10-CM

## 2022-02-12 DIAGNOSIS — Z7984 Long term (current) use of oral hypoglycemic drugs: Secondary | ICD-10-CM | POA: Diagnosis not present

## 2022-02-12 DIAGNOSIS — Z8673 Personal history of transient ischemic attack (TIA), and cerebral infarction without residual deficits: Secondary | ICD-10-CM | POA: Insufficient documentation

## 2022-02-12 DIAGNOSIS — K219 Gastro-esophageal reflux disease without esophagitis: Secondary | ICD-10-CM | POA: Insufficient documentation

## 2022-02-12 DIAGNOSIS — R5383 Other fatigue: Secondary | ICD-10-CM | POA: Diagnosis not present

## 2022-02-12 DIAGNOSIS — N189 Chronic kidney disease, unspecified: Secondary | ICD-10-CM | POA: Insufficient documentation

## 2022-02-12 LAB — BASIC METABOLIC PANEL
Anion gap: 11 (ref 5–15)
BUN: 23 mg/dL (ref 8–23)
CO2: 29 mmol/L (ref 22–32)
Calcium: 10.1 mg/dL (ref 8.9–10.3)
Chloride: 98 mmol/L (ref 98–111)
Creatinine, Ser: 1.2 mg/dL — ABNORMAL HIGH (ref 0.44–1.00)
GFR, Estimated: 41 mL/min — ABNORMAL LOW (ref >60)
Glucose, Bld: 183 mg/dL — ABNORMAL HIGH (ref 70–99)
Potassium: 3.2 mmol/L — ABNORMAL LOW (ref 3.5–5.1)
Sodium: 138 mmol/L (ref 135–145)

## 2022-02-12 NOTE — Patient Instructions (Addendum)
Medication Changes:  No changes. Continue taking your medications as prescribed.  Lab Work:  Labs done today, your results will be available in MyChart, we will contact you for abnormal readings.   Testing/Procedures:  None    Referrals:  None  Special Instructions // Education:  Continue weighing daily and call for an overnight weight gain of 3 pounds or more or a weekly weight gain of more than 5 pounds.  Do the following things EVERYDAY: Weigh yourself in the morning before breakfast. Write it down and keep it in a log. Take your medicines as prescribed Eat low salt foods--Limit salt (sodium) to 2000 mg per day.  Stay as active as you can everyday Limit all fluids for the day to less than 2 liters   Follow-Up in:  1 month    If you have any questions or concerns before your next appointment please send [place] a message through [place] or call our office at [phone]

## 2022-03-16 ENCOUNTER — Ambulatory Visit: Payer: Medicare Other | Admitting: Family

## 2023-05-02 ENCOUNTER — Inpatient Hospital Stay (HOSPITAL_COMMUNITY)

## 2023-05-02 ENCOUNTER — Emergency Department (HOSPITAL_COMMUNITY)

## 2023-05-02 ENCOUNTER — Encounter (HOSPITAL_COMMUNITY): Payer: Self-pay

## 2023-05-02 ENCOUNTER — Inpatient Hospital Stay (HOSPITAL_COMMUNITY)
Admission: EM | Admit: 2023-05-02 | Discharge: 2023-05-04 | DRG: 064 | Disposition: A | Discharge status: Hospice | Source: Skilled Nursing Facility | Attending: Internal Medicine | Admitting: Internal Medicine

## 2023-05-02 DIAGNOSIS — E1165 Type 2 diabetes mellitus with hyperglycemia: Secondary | ICD-10-CM | POA: Diagnosis present

## 2023-05-02 DIAGNOSIS — E785 Hyperlipidemia, unspecified: Secondary | ICD-10-CM | POA: Diagnosis present

## 2023-05-02 DIAGNOSIS — I63311 Cerebral infarction due to thrombosis of right middle cerebral artery: Secondary | ICD-10-CM | POA: Diagnosis present

## 2023-05-02 DIAGNOSIS — I130 Hypertensive heart and chronic kidney disease with heart failure and stage 1 through stage 4 chronic kidney disease, or unspecified chronic kidney disease: Secondary | ICD-10-CM | POA: Diagnosis present

## 2023-05-02 DIAGNOSIS — F0284 Dementia in other diseases classified elsewhere, unspecified severity, with anxiety: Secondary | ICD-10-CM | POA: Diagnosis present

## 2023-05-02 DIAGNOSIS — M1990 Unspecified osteoarthritis, unspecified site: Secondary | ICD-10-CM | POA: Diagnosis present

## 2023-05-02 DIAGNOSIS — Z90721 Acquired absence of ovaries, unilateral: Secondary | ICD-10-CM

## 2023-05-02 DIAGNOSIS — I614 Nontraumatic intracerebral hemorrhage in cerebellum: Secondary | ICD-10-CM | POA: Diagnosis present

## 2023-05-02 DIAGNOSIS — I5030 Unspecified diastolic (congestive) heart failure: Secondary | ICD-10-CM | POA: Insufficient documentation

## 2023-05-02 DIAGNOSIS — Z9013 Acquired absence of bilateral breasts and nipples: Secondary | ICD-10-CM

## 2023-05-02 DIAGNOSIS — Z888 Allergy status to other drugs, medicaments and biological substances status: Secondary | ICD-10-CM

## 2023-05-02 DIAGNOSIS — I4891 Unspecified atrial fibrillation: Secondary | ICD-10-CM | POA: Diagnosis present

## 2023-05-02 DIAGNOSIS — I341 Nonrheumatic mitral (valve) prolapse: Secondary | ICD-10-CM | POA: Diagnosis present

## 2023-05-02 DIAGNOSIS — I639 Cerebral infarction, unspecified: Secondary | ICD-10-CM | POA: Diagnosis present

## 2023-05-02 DIAGNOSIS — Z7984 Long term (current) use of oral hypoglycemic drugs: Secondary | ICD-10-CM

## 2023-05-02 DIAGNOSIS — E119 Type 2 diabetes mellitus without complications: Secondary | ICD-10-CM

## 2023-05-02 DIAGNOSIS — I495 Sick sinus syndrome: Secondary | ICD-10-CM | POA: Diagnosis present

## 2023-05-02 DIAGNOSIS — Z950 Presence of cardiac pacemaker: Secondary | ICD-10-CM

## 2023-05-02 DIAGNOSIS — Z90710 Acquired absence of both cervix and uterus: Secondary | ICD-10-CM

## 2023-05-02 DIAGNOSIS — G309 Alzheimer's disease, unspecified: Secondary | ICD-10-CM | POA: Diagnosis present

## 2023-05-02 DIAGNOSIS — Z853 Personal history of malignant neoplasm of breast: Secondary | ICD-10-CM

## 2023-05-02 DIAGNOSIS — Z7902 Long term (current) use of antithrombotics/antiplatelets: Secondary | ICD-10-CM

## 2023-05-02 DIAGNOSIS — Z515 Encounter for palliative care: Secondary | ICD-10-CM

## 2023-05-02 DIAGNOSIS — H3530 Unspecified macular degeneration: Secondary | ICD-10-CM | POA: Diagnosis present

## 2023-05-02 DIAGNOSIS — I5032 Chronic diastolic (congestive) heart failure: Secondary | ICD-10-CM | POA: Diagnosis present

## 2023-05-02 DIAGNOSIS — H538 Other visual disturbances: Secondary | ICD-10-CM | POA: Diagnosis present

## 2023-05-02 DIAGNOSIS — I2510 Atherosclerotic heart disease of native coronary artery without angina pectoris: Secondary | ICD-10-CM | POA: Diagnosis present

## 2023-05-02 DIAGNOSIS — I63511 Cerebral infarction due to unspecified occlusion or stenosis of right middle cerebral artery: Principal | ICD-10-CM | POA: Diagnosis present

## 2023-05-02 DIAGNOSIS — R414 Neurologic neglect syndrome: Secondary | ICD-10-CM | POA: Diagnosis present

## 2023-05-02 DIAGNOSIS — R1310 Dysphagia, unspecified: Secondary | ICD-10-CM | POA: Diagnosis present

## 2023-05-02 DIAGNOSIS — F02811 Dementia in other diseases classified elsewhere, unspecified severity, with agitation: Secondary | ICD-10-CM | POA: Diagnosis present

## 2023-05-02 DIAGNOSIS — Z9842 Cataract extraction status, left eye: Secondary | ICD-10-CM

## 2023-05-02 DIAGNOSIS — R29713 NIHSS score 13: Secondary | ICD-10-CM | POA: Diagnosis present

## 2023-05-02 DIAGNOSIS — F32A Depression, unspecified: Secondary | ICD-10-CM | POA: Diagnosis present

## 2023-05-02 DIAGNOSIS — I340 Nonrheumatic mitral (valve) insufficiency: Secondary | ICD-10-CM | POA: Diagnosis present

## 2023-05-02 DIAGNOSIS — Z961 Presence of intraocular lens: Secondary | ICD-10-CM | POA: Diagnosis present

## 2023-05-02 DIAGNOSIS — H53462 Homonymous bilateral field defects, left side: Secondary | ICD-10-CM | POA: Diagnosis present

## 2023-05-02 DIAGNOSIS — Z9089 Acquired absence of other organs: Secondary | ICD-10-CM

## 2023-05-02 DIAGNOSIS — Z794 Long term (current) use of insulin: Secondary | ICD-10-CM | POA: Diagnosis not present

## 2023-05-02 DIAGNOSIS — D5911 Warm autoimmune hemolytic anemia: Secondary | ICD-10-CM | POA: Diagnosis present

## 2023-05-02 DIAGNOSIS — Z886 Allergy status to analgesic agent status: Secondary | ICD-10-CM

## 2023-05-02 DIAGNOSIS — F0283 Dementia in other diseases classified elsewhere, unspecified severity, with mood disturbance: Secondary | ICD-10-CM | POA: Diagnosis present

## 2023-05-02 DIAGNOSIS — R471 Dysarthria and anarthria: Secondary | ICD-10-CM | POA: Diagnosis present

## 2023-05-02 DIAGNOSIS — Z7901 Long term (current) use of anticoagulants: Secondary | ICD-10-CM

## 2023-05-02 DIAGNOSIS — Z9841 Cataract extraction status, right eye: Secondary | ICD-10-CM

## 2023-05-02 DIAGNOSIS — G8194 Hemiplegia, unspecified affecting left nondominant side: Secondary | ICD-10-CM | POA: Diagnosis present

## 2023-05-02 DIAGNOSIS — F418 Other specified anxiety disorders: Secondary | ICD-10-CM | POA: Diagnosis present

## 2023-05-02 DIAGNOSIS — Z9049 Acquired absence of other specified parts of digestive tract: Secondary | ICD-10-CM

## 2023-05-02 DIAGNOSIS — Z885 Allergy status to narcotic agent status: Secondary | ICD-10-CM

## 2023-05-02 DIAGNOSIS — N1831 Chronic kidney disease, stage 3a: Secondary | ICD-10-CM | POA: Diagnosis present

## 2023-05-02 DIAGNOSIS — G8191 Hemiplegia, unspecified affecting right dominant side: Secondary | ICD-10-CM

## 2023-05-02 DIAGNOSIS — I6389 Other cerebral infarction: Secondary | ICD-10-CM | POA: Diagnosis not present

## 2023-05-02 DIAGNOSIS — I10 Essential (primary) hypertension: Secondary | ICD-10-CM | POA: Diagnosis present

## 2023-05-02 DIAGNOSIS — Z79899 Other long term (current) drug therapy: Secondary | ICD-10-CM

## 2023-05-02 DIAGNOSIS — E1122 Type 2 diabetes mellitus with diabetic chronic kidney disease: Secondary | ICD-10-CM | POA: Diagnosis present

## 2023-05-02 DIAGNOSIS — Z7982 Long term (current) use of aspirin: Secondary | ICD-10-CM

## 2023-05-02 DIAGNOSIS — Z883 Allergy status to other anti-infective agents status: Secondary | ICD-10-CM

## 2023-05-02 DIAGNOSIS — R29810 Facial weakness: Secondary | ICD-10-CM | POA: Diagnosis present

## 2023-05-02 DIAGNOSIS — K219 Gastro-esophageal reflux disease without esophagitis: Secondary | ICD-10-CM | POA: Diagnosis present

## 2023-05-02 DIAGNOSIS — Z8249 Family history of ischemic heart disease and other diseases of the circulatory system: Secondary | ICD-10-CM

## 2023-05-02 DIAGNOSIS — Z9889 Other specified postprocedural states: Secondary | ICD-10-CM

## 2023-05-02 LAB — COMPREHENSIVE METABOLIC PANEL
ALT: 14 U/L (ref 0–44)
ALT: 14 U/L (ref 0–44)
AST: 20 U/L (ref 15–41)
AST: 18 U/L (ref 15–41)
Albumin: 3.3 g/dL — ABNORMAL LOW (ref 3.5–5.0)
Albumin: 3 g/dL — ABNORMAL LOW (ref 3.5–5.0)
Alkaline Phosphatase: 53 U/L (ref 38–126)
Alkaline Phosphatase: 49 U/L (ref 38–126)
Anion gap: 12 (ref 5–15)
Anion gap: 8 (ref 5–15)
BUN: 24 mg/dL — ABNORMAL HIGH (ref 8–23)
BUN: 21 mg/dL (ref 8–23)
CO2: 18 mmol/L — ABNORMAL LOW (ref 22–32)
CO2: 22 mmol/L (ref 22–32)
Calcium: 9.4 mg/dL (ref 8.9–10.3)
Calcium: 8.7 mg/dL — ABNORMAL LOW (ref 8.9–10.3)
Chloride: 105 mmol/L (ref 98–111)
Chloride: 107 mmol/L (ref 98–111)
Creatinine, Ser: 1.02 mg/dL — ABNORMAL HIGH (ref 0.44–1.00)
Creatinine, Ser: 0.86 mg/dL (ref 0.44–1.00)
GFR, Estimated: 50 mL/min — ABNORMAL LOW (ref >60)
GFR, Estimated: >60 mL/min (ref >60)
Glucose, Bld: 190 mg/dL — ABNORMAL HIGH (ref 70–99)
Glucose, Bld: 139 mg/dL — ABNORMAL HIGH (ref 70–99)
Potassium: 3.8 mmol/L (ref 3.5–5.1)
Potassium: 2.8 mmol/L — ABNORMAL LOW (ref 3.5–5.1)
Sodium: 135 mmol/L (ref 135–145)
Sodium: 137 mmol/L (ref 135–145)
Total Bilirubin: 0.6 mg/dL (ref 0.0–1.2)
Total Bilirubin: 0.5 mg/dL (ref 0.0–1.2)
Total Protein: 5.8 g/dL — ABNORMAL LOW (ref 6.5–8.1)
Total Protein: 5.6 g/dL — ABNORMAL LOW (ref 6.5–8.1)

## 2023-05-02 LAB — CBC
HCT: 39.6 % (ref 36.0–46.0)
Hemoglobin: 12.2 g/dL (ref 12.0–15.0)
MCH: 29.8 pg (ref 26.0–34.0)
MCHC: 30.8 g/dL (ref 30.0–36.0)
MCV: 96.6 fL (ref 80.0–100.0)
Platelets: 205 10*3/uL (ref 150–400)
RBC: 4.1 MIL/uL (ref 3.87–5.11)
RDW: 14.6 % (ref 11.5–15.5)
WBC: 6.1 10*3/uL (ref 4.0–10.5)
nRBC: 0 % (ref 0.0–0.2)

## 2023-05-02 LAB — I-STAT CHEM 8, ED
BUN: 28 mg/dL — ABNORMAL HIGH (ref 8–23)
Calcium, Ion: 1.14 mmol/L — ABNORMAL LOW (ref 1.15–1.40)
Chloride: 103 mmol/L (ref 98–111)
Creatinine, Ser: 1.1 mg/dL — ABNORMAL HIGH (ref 0.44–1.00)
Glucose, Bld: 181 mg/dL — ABNORMAL HIGH (ref 70–99)
HCT: 38 % (ref 36.0–46.0)
Hemoglobin: 12.9 g/dL (ref 12.0–15.0)
Potassium: 3.8 mmol/L (ref 3.5–5.1)
Sodium: 136 mmol/L (ref 135–145)
TCO2: 23 mmol/L (ref 22–32)

## 2023-05-02 LAB — DIFFERENTIAL
Abs Immature Granulocytes: 0.01 10*3/uL (ref 0.00–0.07)
Basophils Absolute: 0 10*3/uL (ref 0.0–0.1)
Basophils Relative: 1 %
Eosinophils Absolute: 0.2 10*3/uL (ref 0.0–0.5)
Eosinophils Relative: 3 %
Immature Granulocytes: 0 %
Lymphocytes Relative: 19 %
Lymphs Abs: 1.2 10*3/uL (ref 0.7–4.0)
Monocytes Absolute: 0.6 10*3/uL (ref 0.1–1.0)
Monocytes Relative: 10 %
Neutro Abs: 4.1 10*3/uL (ref 1.7–7.7)
Neutrophils Relative %: 67 %

## 2023-05-02 LAB — CBG MONITORING, ED
Glucose-Capillary: 155 mg/dL — ABNORMAL HIGH (ref 70–99)
Glucose-Capillary: 90 mg/dL (ref 70–99)
Glucose-Capillary: 90 mg/dL (ref 70–99)
Glucose-Capillary: 126 mg/dL — ABNORMAL HIGH (ref 70–99)
Glucose-Capillary: 147 mg/dL — ABNORMAL HIGH (ref 70–99)
Glucose-Capillary: 95 mg/dL (ref 70–99)
Glucose-Capillary: 101 mg/dL — ABNORMAL HIGH (ref 70–99)

## 2023-05-02 LAB — LIPID PANEL
Cholesterol: 142 mg/dL (ref 0–200)
HDL: 30 mg/dL — ABNORMAL LOW (ref >40)
LDL Cholesterol: 83 mg/dL (ref 0–99)
Total CHOL/HDL Ratio: 4.7 {ratio}
Triglycerides: 144 mg/dL (ref <150)
VLDL: 29 mg/dL (ref 0–40)

## 2023-05-02 LAB — PROTIME-INR
INR: 1.1 (ref 0.8–1.2)
Prothrombin Time: 14.3 s (ref 11.4–15.2)

## 2023-05-02 LAB — ETHANOL: Alcohol, Ethyl (B): <10 mg/dL (ref <10)

## 2023-05-02 LAB — HEMOGLOBIN A1C
Hgb A1c MFr Bld: 8.7 % — ABNORMAL HIGH (ref 4.8–5.6)
Mean Plasma Glucose: 202.99 mg/dL

## 2023-05-02 LAB — APTT: aPTT: 30 s (ref 24–36)

## 2023-05-02 MED ORDER — ENOXAPARIN SODIUM 30 MG/0.3ML IJ SOSY
30.0000 mg | PREFILLED_SYRINGE | Freq: Every day | INTRAMUSCULAR | Status: DC
Start: 2023-05-02 — End: 2023-05-04
  Administered 2023-05-02 – 2023-05-04 (×3): 30 mg via SUBCUTANEOUS
  Filled 2023-05-02 (×3): qty 0.3

## 2023-05-02 MED ORDER — IOHEXOL 350 MG/ML SOLN
75.0000 mL | Freq: Once | INTRAVENOUS | Status: AC | PRN
  Administered 2023-05-02: 75 mL via INTRAVENOUS

## 2023-05-02 MED ORDER — ASPIRIN 300 MG RE SUPP
300.0000 mg | Freq: Once | RECTAL | Status: AC
  Administered 2023-05-02: 300 mg via RECTAL
  Filled 2023-05-02: qty 1

## 2023-05-02 MED ORDER — METOPROLOL TARTRATE 5 MG/5ML IV SOLN: 5.0000 mg | INTRAVENOUS | Status: DC | PRN

## 2023-05-02 MED ORDER — SODIUM CHLORIDE 0.9% FLUSH
3.0000 mL | Freq: Once | INTRAVENOUS | Status: AC
  Administered 2023-05-02: 3 mL via INTRAVENOUS

## 2023-05-02 MED ORDER — ACETAMINOPHEN 650 MG RE SUPP
650.0000 mg | RECTAL | Status: DC | PRN
  Administered 2023-05-02: 650 mg via RECTAL
  Filled 2023-05-02: qty 1

## 2023-05-02 MED ORDER — ACETAMINOPHEN 160 MG/5ML PO SOLN: 650.0000 mg | ORAL | Status: DC | PRN

## 2023-05-02 MED ORDER — DEXTROSE-SODIUM CHLORIDE 5-0.9 % IV SOLN: INTRAVENOUS | Status: AC

## 2023-05-02 MED ORDER — ASPIRIN 81 MG PO TBEC
81.0000 mg | DELAYED_RELEASE_TABLET | Freq: Every day | ORAL | Status: DC
  Filled 2023-05-02: qty 1

## 2023-05-02 MED ORDER — ACETAMINOPHEN 325 MG PO TABS
650.0000 mg | ORAL_TABLET | ORAL | Status: DC | PRN
  Administered 2023-05-03: 650 mg via ORAL
  Filled 2023-05-02: qty 2

## 2023-05-02 MED ORDER — STROKE: EARLY STAGES OF RECOVERY BOOK
Freq: Once | Status: AC
  Filled 2023-05-02: qty 1

## 2023-05-02 MED ORDER — DIGOXIN 125 MCG PO TABS
0.1250 mg | ORAL_TABLET | Freq: Every day | ORAL | Status: DC
  Administered 2023-05-04: 0.125 mg via ORAL
  Filled 2023-05-02 (×3): qty 1

## 2023-05-02 MED ORDER — ASPIRIN 300 MG RE SUPP
300.0000 mg | Freq: Every day | RECTAL | Status: DC
  Administered 2023-05-02 – 2023-05-04 (×3): 300 mg via RECTAL
  Filled 2023-05-02 (×3): qty 1

## 2023-05-02 MED ORDER — INSULIN ASPART 100 UNIT/ML IJ SOLN
0.0000 [IU] | INTRAMUSCULAR | Status: DC
  Administered 2023-05-02: 2 [IU] via SUBCUTANEOUS
  Administered 2023-05-02: 3 [IU] via SUBCUTANEOUS
  Administered 2023-05-03 (×2): 2 [IU] via SUBCUTANEOUS
  Administered 2023-05-03 (×2): 3 [IU] via SUBCUTANEOUS
  Administered 2023-05-03: 2 [IU] via SUBCUTANEOUS
  Administered 2023-05-03: 3 [IU] via SUBCUTANEOUS
  Administered 2023-05-03 – 2023-05-04 (×2): 2 [IU] via SUBCUTANEOUS

## 2023-05-02 MED ORDER — ASPIRIN 300 MG RE SUPP: 300.0000 mg | Freq: Every day | RECTAL | Status: DC

## 2023-05-02 MED ORDER — ASPIRIN 325 MG PO TABS: 325.0000 mg | ORAL_TABLET | Freq: Once | ORAL | Status: AC

## 2023-05-02 NOTE — Evaluation (Signed)
Clinical/Bedside Swallow Evaluation Patient Details  Name: Elizabeth Novak MRN: [id number] Date of Birth: 1924-01-06  Today's Date: 05/02/2023 Time: SLP Start Time (ACUTE ONLY): 1050 SLP Stop Time (ACUTE ONLY): 1105 SLP Time Calculation (min) (ACUTE ONLY): 15 min  Past Medical History:  Past Medical History:  Diagnosis Date   A-fib (HCC)    Anemia    Anxiety    Arthritis    Breast cancer (HCC) 1978   left breast with lymph node removal   CHF (congestive heart failure) (HCC)    Chronic kidney disease    Depression    Diabetes mellitus without complication (HCC)    Diverticulitis    Dysrhythmia    GERD (gastroesophageal reflux disease)    Hyperlipidemia    Hypertension    Macular degeneration of both eyes    Mitral valve prolapse    Mitral valve regurgitation    Presence of permanent cardiac pacemaker    Stroke [facility])    Past Surgical History:  Past Surgical History:  Procedure Laterality Date   ABDOMINAL HYSTERECTOMY     APPENDECTOMY     BREAST SURGERY     CARDIAC CATHETERIZATION     ESOPHAGEAL DILATION     EYE SURGERY Bilateral    Cataract Extraction with IOL   INSERT / REPLACE / REMOVE PACEMAKER     IRRIGATION AND DEBRIDEMENT HEMATOMA Left 08/21/2015   Procedure: IRRIGATION AND DEBRIDEMENT HEMATOMA;  Surgeon: Earline Mayotte, MD;  Location: [facility] ORS;  Service: General;  Laterality: Left;   KNEE ARTHROSCOPY Right    LEFT OOPHORECTOMY Left    MASTECTOMY Left 1978   MASTECTOMY Right 1978   OPEN REDUCTION INTERNAL FIXATION (ORIF) DISTAL RADIAL FRACTURE Right 02/14/2018   Procedure: OPEN REDUCTION INTERNAL FIXATION (ORIF) DISTAL RADIAL FRACTURE;  Surgeon: Kennedy Bucker, MD;  Location: [facility] ORS;  Service: Orthopedics;  Laterality: Right;   PACEMAKER INSERTION  08/11/12   PPM GENERATOR CHANGEOUT N/A 05/02/2020   Procedure: PPM GENERATOR CHANGEOUT;  Surgeon: Marcina Millard, MD;  Location: [facility] INVASIVE CV LAB;  Service: Cardiovascular;  Laterality: N/A;    TEMPORAL ARTERY BIOPSY / LIGATION     TONSILLECTOMY     HPI:  Patient is a 89 y.o. female with PMH: dementia (resident of memory care ALF), CHF, DM-2,HTN, anxiety, depression, CVA  with residual right sided weakness, dysphagia (2015 esophagram reported: laryngeal penetration of liquids, osteophytes causing mild deformity of posterior wall of esophagus). She presented to the hospital on 05/02/23 as a code stroke with right gaze preference, left hemianopia, left facial droop,, left arm weakness and left leg weakness. CT head showed acute right MCA  infarct and possible subacute hematoma and/or subacute hemorrhagic stroke in right cerebellum. She failed Yale swallow with RN and was kept NPO awaiting SLP swallow evaluation.    Assessment / Plan / Recommendation  Clinical Impression  Patient presents with clinical s/s of dysphagia as per this BSE. She exhibited immediate cough after first small cup sip of thin liquids (water) which occured prior to her initiating a swallow and during coughing episode, patient's RR increased briefly to 40 (from low 20's) and HR increased to 120 from mid 60's before returning to baseline. 1/4-1/2 spoon sips of water resulted in fairly immediate coughing as well. 1/2 spoon sips of nectar thick juice did not result in any overt s/s aspiration. Paitent has a documented h/o dysphagia as per esophagrams (most recent completed in 2015) reporting presbyesophagus, vestibular penetration of liquids, osteophytes causing deformity of posterior wall of  esophagus. Patient's swallow function as likely declined since 2015 and in addition, she has now had an acute CVA. SLP is recommending to continue NPO status at this time. PO and objective swallow assessment recommendations will be dependent upon POC. SLP will follow briefly. SLP Visit Diagnosis: Dysphagia, unspecified (R13.10)    Aspiration Risk  Severe aspiration risk;Risk for inadequate nutrition/hydration    Diet Recommendation NPO     Medication Administration: Via alternative means    Other  Recommendations Oral Care Recommendations: Oral care QID;Staff/trained caregiver to provide oral care    Recommendations for follow up therapy are one component of a multi-disciplinary discharge planning process, led by the attending physician.  Recommendations may be updated based on patient status, additional functional criteria and insurance authorization.  Follow up Recommendations Other (comment) (TBD)      Assistance Recommended at Discharge    Functional Status Assessment Patient has had a recent decline in their functional status and demonstrates the ability to make significant improvements in function in a reasonable and predictable amount of time.  Frequency and Duration min 2x/week  1 week       Prognosis Prognosis for improved oropharyngeal function: Guarded Barriers to Reach Goals: Severity of deficits;Time post onset;Cognitive deficits      Swallow Study   General Date of Onset: 05/02/23 HPI: Patient is a 89 y.o. female with PMH: dementia (resident of memory care ALF), CHF, DM-2,HTN, anxiety, depression, CVA  with residual right sided weakness, dysphagia (2015 esophagram reported: laryngeal penetration of liquids, osteophytes causing mild deformity of posterior wall of esophagus). She presented to the hospital on 05/02/23 as a code stroke with right gaze preference, left hemianopia, left facial droop,, left arm weakness and left leg weakness. CT head showed acute right MCA  infarct and possible subacute hematoma and/or subacute hemorrhagic stroke in right cerebellum. She failed Yale swallow with RN and was kept NPO awaiting SLP swallow evaluation. Type of Study: Bedside Swallow Evaluation Previous Swallow Assessment: none found Diet Prior to this Study: NPO Temperature Spikes Noted: No Respiratory Status: Room air History of Recent Intubation: No Behavior/Cognition: Alert;Cooperative;Pleasant  mood;Confused Oral Cavity Assessment: Within Functional Limits Oral Care Completed by SLP: Other (Comment) (attempted but patient refused) Oral Cavity - Dentition: Adequate natural dentition Self-Feeding Abilities: Needs assist;Needs set up Patient Positioning: Postural control adequate for testing;Upright in bed Baseline Vocal Quality: Normal Volitional Cough: Cognitively unable to elicit Volitional Swallow: Unable to elicit    Oral/Motor/Sensory Function Overall Oral Motor/Sensory Function: Mild impairment Facial ROM: Reduced left;Suspected CN VII (facial) dysfunction Facial Symmetry: Abnormal symmetry left Facial Strength: Reduced left Lingual ROM: Other (Comment) (able to protrude tongue but not following commands for other ROM) Lingual Symmetry: Within Functional Limits   Ice Chips     Thin Liquid Thin Liquid: Impaired Presentation: Cup;Spoon Pharyngeal  Phase Impairments: Cough - Immediate;Change in Vital Signs    Nectar Thick Nectar Thick Liquid: Impaired Presentation: Spoon Pharyngeal Phase Impairments: Suspected delayed Swallow   Honey Thick Honey Thick Liquid: Not tested   Puree Puree: Not tested   Solid     Solid: Not tested      Angela Nevin, MA, CCC-SLP Speech Therapy
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
## 2023-05-02 NOTE — ED Provider Notes (Signed)
[place] EMERGENCY DEPARTMENT AT [facility] Provider Note   CSN: [id number] Arrival date & time: 05/02/23  0025  An emergency department physician performed an initial assessment on this suspected stroke patient at 0026.  History  Chief Complaint  Patient presents with   Code Stroke    Elizabeth Novak is a 89 y.o. female.  The history is provided by the nursing home and the EMS personnel.  She has history of hypertension, diabetes, hyperlipidemia, stroke with right hemiparesis, heart failure, chronic kidney disease, atrial fibrillation not on anticoagulation, status post permanent pacemaker insertion and is sent from a nursing facility because of concern for stroke.  She was noted tonight to have dysarthric speech, new left-sided weakness, left-sided neglect.  Last known well was at 8:45 PM.   Home Medications Prior to Admission medications   Medication Sig Start Date End Date Taking? Authorizing Provider  ANTI-DIARRHEAL 2 MG tablet Take 1 tablet by mouth every 6 (six) hours. 03/30/23  Yes [provider]  busPIRone (BUSPAR) 5 MG tablet Take 5 mg by mouth 3 (three) times daily. 04/28/23  Yes [provider]  ciprofloxacin (CIPRO) 500 MG tablet Take 500 mg by mouth 2 (two) times daily. 04/13/23  Yes [provider]  famotidine (PEPCID) 20 MG tablet Take 20 mg by mouth daily. 04/28/23  Yes [provider]  isosorbide dinitrate (ISORDIL) 10 MG tablet Take 10 mg by mouth 2 (two) times daily. 04/28/23  Yes [provider]  LANTUS SOLOSTAR 100 UNIT/ML Solostar Pen Inject into the skin. 04/29/23  Yes [provider]  metoprolol tartrate (LOPRESSOR) 25 MG tablet Take 25 mg by mouth 2 (two) times daily. 04/28/23  Yes [provider]  QUEtiapine (SEROQUEL) 25 MG tablet Take by mouth. 04/12/23  Yes [provider]  sertraline (ZOLOFT) 100 MG tablet Take 100 mg by mouth daily. 04/28/23  Yes [provider]   XIFAXAN 200 MG tablet Take by mouth. 01/12/23  Yes [provider]  acetaminophen (TYLENOL) 325 MG tablet Take 2 tablets (650 mg total) by mouth every 6 (six) hours as needed for mild pain. 04/29/21   Leeroy Bock, MD  allopurinol (ZYLOPRIM) 100 MG tablet Take 100 mg by mouth daily.    [provider]  apixaban (ELIQUIS) 2.5 MG TABS tablet Take 2.5 mg by mouth 2 (two) times daily.    [provider]  Cholecalciferol (VITAMIN D) 50 MCG (2000 UT) tablet Take 2,000 Units by mouth daily.    [provider]  cyanocobalamin (VITAMIN B12) 1000 MCG tablet Take 1 tablet (1,000 mcg total) by mouth daily. 02/06/22   Arnetha Courser, MD  diclofenac Sodium (VOLTAREN) 1 % GEL Apply 2 g topically 3 (three) times daily. (Apply to knees)    [provider]  digoxin (LANOXIN) 0.125 MG tablet Take 1 tablet (0.125 mg total) by mouth daily. 08/02/21   Lurene Shadow, MD  docusate sodium (COLACE) 100 MG capsule Take 100 mg by mouth 2 (two) times daily.    [provider]  empagliflozin (JARDIANCE) 10 MG TABS tablet Take by mouth daily.    [provider]  ferrous sulfate 325 (65 FE) MG EC tablet Take 1 tablet (325 mg total) by mouth 2 (two) times daily. 02/06/22 02/06/23  Arnetha Courser, MD  glipiZIDE (GLUCOTROL) 10 MG tablet Take 10 mg by mouth daily.    [provider]  insulin glargine-yfgn (SEMGLEE) 100 UNIT/ML injection Inject 0.09 mLs (9 Units total) into the  skin 2 (two) times daily. 08/02/21   Lurene Shadow, MD  isosorbide mononitrate (IMDUR) 30 MG 24 hr tablet Take 30 mg by mouth daily.    [provider]  metolazone (ZAROXOLYN) 2.5 MG tablet Take 1 tablet (2.5 mg total) by mouth daily as needed. Home med. Patient taking differently: Take 2.5 mg by mouth daily as needed (excessive fluid retention). 07/03/21   Darlin Priestly, MD  metoprolol succinate (TOPROL-XL) 50 MG 24 hr tablet Take 1 tablet (50 mg total) by mouth daily. 05/10/18   Sharee Holster, NP  nitroGLYCERIN (NITROSTAT) 0.4 MG SL tablet Place 0.4 mg under the tongue every 5 (five) minutes as needed for chest pain.    [provider]  pantoprazole (PROTONIX) 40 MG tablet Take 40 mg by mouth 2 (two) times daily.    [provider]  polyethylene glycol (MIRALAX / GLYCOLAX) 17 g packet Take 17 g by mouth daily. 04/29/21   Leeroy Bock, MD  potassium chloride (MICRO-K) 10 MEQ CR capsule Take 40 mEq by mouth 2 (two) times daily.    [provider]  senna (SENOKOT) 8.6 MG TABS tablet Take 2 tablets by mouth 2 (two) times daily.    [provider]  sertraline (ZOLOFT) 25 MG tablet Take 25 mg by mouth daily.    [provider]  simethicone (MYLICON) 125 MG chewable tablet Chew 250 mg by mouth daily as needed for flatulence.    [provider]  sodium chloride (OCEAN) 0.65 % SOLN nasal spray Place 2 sprays into both nostrils every 6 (six) hours as needed for congestion.    [provider]  torsemide (DEMADEX) 20 MG tablet Take 40 mg by mouth 2 (two) times daily.    [provider]      Allergies    Codeine, Disopyramide, Ibuprofen, Iodine, Metformin and related, Norpace [disopyramide phosphate], Quinidine, Terfenadine, Topiramate, Verapamil, and Dexamethasone sodium phosphate    Review of Systems   Review of Systems  All other systems reviewed and are negative.   Physical Exam Updated Vital Signs BP (!) 152/65   Pulse (!) 59   Resp 19   Wt 45.2 kg   SpO2 98%   BMI 17.10 kg/m  Physical Exam Vitals and nursing note reviewed.   89 year old female, resting comfortably and in no acute distress. Vital signs are significant for sure. Oxygen saturation is 98%, which is normal. Head is normocephalic and atraumatic. PERRLA, she has a gaze preference to the right, but I can get her to look completely to the left. Oropharynx is clear. Neck is nontender and supple without adenopathy or JVD.  There are no  carotid bruits. Lungs are clear without rales, wheezes, or rhonchi. Chest is nontender. Heart has regular rate and rhythm without murmur. Abdomen is soft, flat, nontender. Extremities have no cyanosis or edema. Skin is warm and dry without rash. Neurologic: Awake but with moderately dysarthric speech.  No obvious facial droop.  She has generalized weakness, but left side seems weaker than the right.  Motor exam is difficult because of poor cooperation.  She has a tendency to neglect the left side.  ED Results / Procedures / Treatments   Labs (all labs ordered are listed, but only abnormal results are displayed) Labs Reviewed  COMPREHENSIVE METABOLIC PANEL - Abnormal; Notable for the following components:      Result Value   CO2 18 (*)    Glucose, Bld 190 (*)    BUN 24 (*)  Creatinine, Ser 1.02 (*)    Total Protein 5.8 (*)    Albumin 3.3 (*)    GFR, Estimated 50 (*)    All other components within normal limits  I-STAT CHEM 8, ED - Abnormal; Notable for the following components:   BUN 28 (*)    Creatinine, Ser 1.10 (*)    Glucose, Bld 181 (*)    Calcium, Ion 1.14 (*)    All other components within normal limits  PROTIME-INR  APTT  CBC  DIFFERENTIAL  ETHANOL  CBG MONITORING, ED    EKG EKG Interpretation Date/Time:  Sunday May 02 2023 01:47:23 EST Ventricular Rate:  60 PR Interval:  217 QRS Duration:  185 QT Interval:  456 QTC Calculation: 456 R Axis:   -83  Text Interpretation: VENTRICULAR PACED RHYTHM When compared with ECG of 02/05/2022, No significant change was found Confirmed by Dione Booze ([id number]) on 05/02/2023 2:08:29 AM  Radiology CT HEAD CODE STROKE WO CONTRAST Result Date: 05/02/2023 CLINICAL DATA:  Code stroke. Initial evaluation for acute neuro deficit, stroke suspected. EXAM: CT HEAD WITHOUT CONTRAST TECHNIQUE: Contiguous axial images were obtained from the base of the skull through the vertex without intravenous contrast. RADIATION DOSE REDUCTION:  This exam was performed according to the departmental dose-optimization program which includes automated exposure control, adjustment of the mA and/or kV according to patient size and/or use of iterative reconstruction technique. COMPARISON:  CT from 08/01/2021. FINDINGS: Brain: Age-related cerebral atrophy with chronic microvascular ischemic disease. Small remote left cerebellar infarct noted. Remote infarcts involving the right frontal and parietal lobes noted. There is subtle loss of gray-white matter differentiation involving the right insular cortex, suspicious for an evolving right MCA distribution infarct. Basal ganglia maintained. Few additional scattered patchy hypodensities noted involving the overlying right frontal region (series 2, images 24, 25). Patchy hypodensity at the posterior right parietal region (series 2, image 23). 3.3 cm hypodense area with heterogeneous central hyperdensity present within the right cerebellum (series 2, image 11). Finding favored to reflect an evolving subacute hematoma and/or hemorrhagic stroke. Underlying mass not excluded. No other acute intracranial hemorrhage or mass lesion. No hydrocephalus or extra-axial fluid collection. Vascular: Dense right MCA branch at the sylvian fissure, suspicious for thrombus (series 2, image 15). Skull: Scalp soft tissues within normal limits.  Calvarium intact. Sinuses/Orbits: Right gaze noted. Paranasal sinuses and mastoid air cells are largely clear. Other: None. ASPECTS [facility] Stroke Program Early CT Score) - Ganglionic level infarction (caudate, lentiform nuclei, internal capsule, insula, M1-M3 cortex): 6 - Supraganglionic infarction (M4-M6 cortex): 1 Total score (0-10 with 10 being normal): 7 IMPRESSION: 1. Findings concerning for acute right MCA distribution infarct as above. Associated dense right MCA branch at the Sylvian fissure, suspicious for thrombus. 2. Aspects = 7. 3. 3.3 cm hypodense area with heterogeneous central  hyperdensity within the right cerebellum. Finding favored to reflect an evolving subacute hematoma and/or subacute hemorrhagic stroke. Underlying mass not excluded. 4. Underlying age-related cerebral atrophy with chronic microvascular ischemic disease. These results were communicated to Dr. Otelia Limes at 1:01 am on 05/02/2023 by text page via the [facility] messaging system. Electronically Signed   By: Rise Mu M.D.   On: 05/02/2023 01:04    Procedures Procedures  Cardiac monitor shows ventricular paced rhythm, per my interpretation.  Medications Ordered in ED Medications  sodium chloride flush (NS) 0.9 % injection 3 mL (has no administration in time range)    ED Course/ Medical Decision Making/ A&P  Medical Decision Making Risk Decision regarding hospitalization.   Apparent stroke with left hemiparesis and patient who had previous right hemiparesis.  She arrived as a code stroke.  Emergent CT of head shows acute right MCA infarct for with aspects equal 7, hypodense area in the right cerebellum which is likely a subacute hemorrhagic stroke.  Chest x-ray shows probable mild interstitial edema.  I have independently viewed the images, and agree with the radiologist's interpretation.  I have discussed case with Dr. Otelia Limes of neurology service, she is not a candidate for thrombolytics with possible hemorrhagic stroke, not a candidate for thrombectomy because of debilitated baseline status.  MRI not done tonight because of presence of pacemaker, will need to determine whether it is MRI compliant and get MRI when appropriate team is present.  I have reviewed her electrocardiogram, and my interpretation is ventricular paced rhythm.  I have reviewed her laboratory tests, and my interpretation is as stable renal insufficiency, normal CBC, normal coagulation studies, undetectable ethanol level.  I have discussed the case with Dr. Joneen Roach of [facility], who agrees  to admit the patient.  CRITICAL CARE Performed by: Dione Booze Total critical care time: 55 minutes Critical care time was exclusive of separately billable procedures and treating other patients. Critical care was necessary to treat or prevent imminent or life-threatening deterioration. Critical care was time spent personally by me on the following activities: development of treatment plan with patient and/or surrogate as well as nursing, discussions with consultants, evaluation of patient's response to treatment, examination of patient, obtaining history from patient or surrogate, ordering and performing treatments and interventions, ordering and review of laboratory studies, ordering and review of radiographic studies, pulse oximetry and re-evaluation of patient's condition.  Final Clinical Impression(s) / ED Diagnoses Final diagnoses:  Cerebrovascular accident (CVA), unspecified mechanism (HCC)    Rx / DC Orders ED Discharge Orders     None         Dione Booze, MD 05/02/23 [phone]

## 2023-05-02 NOTE — ED Notes (Signed)
Patient transported to CT

## 2023-05-02 NOTE — ED Triage Notes (Signed)
Patient is coming from an assisted nursing facility. Last known well was 2045 tonight. Patient presenting with left side facial droop and arm weakness. Patient also presents with right eye deviation. Hx of stroke with aphasia.  EMS noted patient repeating questions and was Aox1 to self. CBG 204 Patient currently on no blood thinners.

## 2023-05-02 NOTE — Hospital Course (Signed)
89 y/o female with PMHx of atrual fibrillation, CHF., T2DM, HLD, HTN, anxiety and depression, dementia, SSS sp PPM, history of CVA with residual right-sided weakness.  Patient's rehab center SNF memory care unit, patient brought in as a code stroke.  Patient with right gaze preference, left hemianopia, left facial droop, left arm weakness, left leg weakness   Presenting vitals 143/65, 13F, 16, afebrile. CT head shows acute right MCA distribution infarct, associated dense right MCA branch at the Sylvian fissure, suspicious for thrombus.  Possible subacute hematoma and/or subacute hemorrhagic stroke in the right cerebellum

## 2023-05-02 NOTE — Code Documentation (Signed)
Stroke Response Nurse Documentation Code Documentation  Elizabeth Novak is a 89 y.o. female arriving to [facility]  via [facility] on 1/26 with past medical hx of afib. On No antithrombotic. Code stroke was activated by EMS.   Patient from SNF where she was LKW at 2045 and now complaining of right gaze preference and left sided weakness.   Stroke team at the bedside on patient arrival. Labs drawn and patient cleared for CT by Dr. Eudelia Bunch. Patient to CT with team. NIHSS 13, see documentation for details and code stroke times. Patient with disoriented, right gaze preference , left hemianopia, left facial droop, left arm weakness, left leg weakness, left decreased sensation, dysarthria , and left neglect on exam. The following imaging was completed:  CT Head. Patient is not a candidate for IV Thrombolytic due to Hemorrhage on CT. Patient is not a candidate for IR due to risk outweighs benefit.      Bedside handoff with ED RN Marcie Bal.    Rose Fillers  Rapid Response RN

## 2023-05-02 NOTE — ED Triage Notes (Signed)
Patient is coming from an assisted nursing facility. Last known well was 2045 on 05/01/23. Patient presenting with left side facial droop and arm weakness. Patient also presents with right eye deviation. Hx of stroke with aphasia.  EMS noted patient repeating questions and was Aox1 to self. CBG 204 Patient currently on no blood thinners.

## 2023-05-02 NOTE — Progress Notes (Addendum)
STROKE TEAM PROGRESS NOTE   BRIEF HPI [name] is a 89 y.o. female with history of A-fib not on anticoagulation, anemia, arthritis, anxiety, breast cancer in 1978, CHF, CKD, depression, diabetes, diverticulitis, GERD, hyperlipidemia, hypertension, macular degeneration bilaterally, mitral valve prolapse, mitral valve regurgitation, s/p PPM and old stroke with residual right-sided weakness presenting with cute onset of left side weakness right gaze deviation dysarthria and left hemineglect.   NIH on Admission 13   SIGNIFICANT HOSPITAL EVENTS 1/26 CT brain with acute right MCA infarct, aspects 7.3 cm hypodense area with heterogenicity central hyperdensity within the right cerebellum  INTERIM HISTORY/SUBJECTIVE No family at the bedside.  Patient is laying in the bed.  Patient is awake and alert, oriented to self and age, stated month "December" year as "2026".  Can follow most commands.  Speech is dysarthric, no aphasia.  Eyes with forced right gaze, does not cross midline, left hemianopia, no blink to threat on left.  Left facial droop.  Left arm with some movement but able to hold up for 10 seconds hits the bed, left lower unable to hold against gravity, right arm and right leg spontaneous movement and antigravity.  Sensation decreased on left arm and leg, with left neglect Unsure if pacemaker is MRI compatible will need to look further into it  OBJECTIVE  CBC    Component Value Date/Time   WBC 6.1 05/02/2023 0028   RBC 4.10 05/02/2023 0028   HGB 12.9 05/02/2023 0033   HGB 12.6 08/08/2012 1055   HCT 38.0 05/02/2023 0033   HCT 37.2 08/08/2012 1055   PLT 205 05/02/2023 0028   PLT 258 08/08/2012 1055   MCV 96.6 05/02/2023 0028   MCV 84 08/08/2012 1055   MCH 29.8 05/02/2023 0028   MCHC 30.8 05/02/2023 0028   RDW 14.6 05/02/2023 0028   RDW 13.9 08/08/2012 1055   LYMPHSABS 1.2 05/02/2023 0028   LYMPHSABS 1.4 08/08/2012 1055   MONOABS 0.6 05/02/2023 0028   MONOABS 0.6  08/08/2012 1055   EOSABS 0.2 05/02/2023 0028   EOSABS 0.1 08/08/2012 1055   BASOSABS 0.0 05/02/2023 0028   BASOSABS 0.1 08/08/2012 1055    BMET    Component Value Date/Time   NA 137 05/02/2023 0357   NA 138 08/08/2012 1055   K 2.8 (L) 05/02/2023 0357   K 4.2 08/08/2012 1055   CL 107 05/02/2023 0357   CL 103 08/08/2012 1055   CO2 22 05/02/2023 0357   CO2 17 (L) 08/08/2012 1055   GLUCOSE 139 (H) 05/02/2023 0357   GLUCOSE 228 (H) 08/08/2012 1055   BUN 21 05/02/2023 0357   BUN 18 08/08/2012 1055   CREATININE 0.86 05/02/2023 0357   CREATININE 0.72 08/08/2012 1055   CALCIUM 8.7 (L) 05/02/2023 0357   CALCIUM 10.3 (H) 08/08/2012 1055   GFRNONAA >60 05/02/2023 0357   GFRNONAA >60 08/08/2012 1055    IMAGING past 24 hours CT ANGIO HEAD NECK W WO CM Result Date: 05/02/2023 CLINICAL DATA:  Initial evaluation for acute neuro deficit, stroke suspected. EXAM: CT ANGIOGRAPHY HEAD AND NECK WITH AND WITHOUT CONTRAST TECHNIQUE: Multidetector CT imaging of the head and neck was performed using the standard protocol during bolus administration of intravenous contrast. Multiplanar CT image reconstructions and MIPs were obtained to evaluate the vascular anatomy. Carotid stenosis measurements (when applicable) are obtained utilizing NASCET criteria, using the distal internal carotid diameter as the denominator. RADIATION DOSE REDUCTION: This exam was performed according to the departmental dose-optimization program which includes automated  exposure control, adjustment of the mA and/or kV according to patient size and/or use of iterative reconstruction technique. CONTRAST:  75mL OMNIPAQUE IOHEXOL 350 MG/ML SOLN COMPARISON:  Prior CT from earlier the same day. FINDINGS: CTA NECK FINDINGS Aortic arch: Visualized aortic arch ectatic measuring up to 3.1 cm. Standard 3 vessel morphology. Moderate aortic atherosclerosis. No high-grade stenosis about the origin the great vessels. Right carotid system: Right common  and internal carotid arteries are tortuous with patent without dissection. Moderate atheromatous change about the right carotid bulb without hemodynamically significant greater than 50% stenosis. Left carotid system: Left common and internal carotid arteries are tortuous but patent without dissection. Moderate atheromatous change about the left carotid bulb with up to 60% stenosis by NASCET criteria. Vertebral arteries: Both vertebral arteries arise from the subclavian arteries. No significant proximal subclavian artery stenosis. Atheromatous change at the origin of the right vertebral artery with moderate stenosis. Vertebral arteries patent distally without significant stenosis or dissection. Skeleton: No discrete or worrisome osseous lesions. Exaggeration of the normal cervical lordosis with mild spondylosis. Other neck: No other acute finding. Upper chest: No other acute finding. Left-sided pacemaker/AICD in place. Review of the MIP images confirms the above findings CTA HEAD FINDINGS Anterior circulation: Atheromatous change about the carotid siphons with up to mild narrowing bilaterally. Filling defect within the cavernous left ICA suspicious for subocclusive thrombus (series 5, images 103-106). A1 segments patent. Normal anterior communicating artery complex. Anterior cerebral arteries patent without stenosis. Left M1 segment and distal left MCA branches patent and well perfused. Right M1 segment patent. There is acute occlusion of a proximal right M2 branch, inferior division (series 5, image 92). Relatively minimal/scant collateral flow seen within the right MCA distribution distally. Finding corresponds with hyperdense vessel seen on prior noncontrast head CT. Posterior circulation: Both V4 segments patent without stenosis. Left vertebral artery dominant. Both PICA patent. Basilar patent without stenosis. Superior cerebral arteries patent bilaterally. Both PCAs primarily supplied via the basilar. PCAs  patent to their distal aspects without significant stenosis. Venous sinuses: Patent allowing for timing of the contrast bolus. Anatomic variants: As above. No intracranial aneurysm. 8 mm enhancing lesion overlying the left frontal convexity (series 5, image 38). An additional 1.3 cm enhancing lesion also seen overlying the anterior left frontal convexity slightly inferiorly (series 5, image 62). Findings favored to reflect meningiomas. No vascular malformation seen underlying the previously identified right cerebellar hemorrhage. Review of the MIP images confirms the above findings IMPRESSION: 1. Acute proximal right M2 branch occlusion, inferior division. 2. Filling defect within the cavernous left ICA, likely subocclusive thrombus. 3. No vascular abnormality seen underlying the previously identified right cerebellar hemorrhage. 4. Moderate atheromatous change about the carotid bifurcations with up to 60% stenosis on the left. 5. Moderate atheromatous stenosis at the origin of the right vertebral artery. 6. Two separate enhancing lesions overlying the left frontal convexity, likely small meningiomas. 7.  Aortic Atherosclerosis (ICD10-I70.0). These results were communicated to Dr. Otelia Limes at 4:09 am on 05/02/2023 by text page via the [facility] messaging system. Electronically Signed   By: Rise Mu M.D.   On: 05/02/2023 04:13   CT HEAD CODE STROKE WO CONTRAST Result Date: 05/02/2023 CLINICAL DATA:  Code stroke. Initial evaluation for acute neuro deficit, stroke suspected. EXAM: CT HEAD WITHOUT CONTRAST TECHNIQUE: Contiguous axial images were obtained from the base of the skull through the vertex without intravenous contrast. RADIATION DOSE REDUCTION: This exam was performed according to the departmental dose-optimization program which includes automated exposure  control, adjustment of the mA and/or kV according to patient size and/or use of iterative reconstruction technique. COMPARISON:  CT from  08/01/2021. FINDINGS: Brain: Age-related cerebral atrophy with chronic microvascular ischemic disease. Small remote left cerebellar infarct noted. Remote infarcts involving the right frontal and parietal lobes noted. There is subtle loss of gray-white matter differentiation involving the right insular cortex, suspicious for an evolving right MCA distribution infarct. Basal ganglia maintained. Few additional scattered patchy hypodensities noted involving the overlying right frontal region (series 2, images 24, 25). Patchy hypodensity at the posterior right parietal region (series 2, image 23). 3.3 cm hypodense area with heterogeneous central hyperdensity present within the right cerebellum (series 2, image 11). Finding favored to reflect an evolving subacute hematoma and/or hemorrhagic stroke. Underlying mass not excluded. No other acute intracranial hemorrhage or mass lesion. No hydrocephalus or extra-axial fluid collection. Vascular: Dense right MCA branch at the sylvian fissure, suspicious for thrombus (series 2, image 15). Skull: Scalp soft tissues within normal limits.  Calvarium intact. Sinuses/Orbits: Right gaze noted. Paranasal sinuses and mastoid air cells are largely clear. Other: None. ASPECTS [facility] Stroke Program Early CT Score) - Ganglionic level infarction (caudate, lentiform nuclei, internal capsule, insula, M1-M3 cortex): 6 - Supraganglionic infarction (M4-M6 cortex): 1 Total score (0-10 with 10 being normal): 7 IMPRESSION: 1. Findings concerning for acute right MCA distribution infarct as above. Associated dense right MCA branch at the Sylvian fissure, suspicious for thrombus. 2. Aspects = 7. 3. 3.3 cm hypodense area with heterogeneous central hyperdensity within the right cerebellum. Finding favored to reflect an evolving subacute hematoma and/or subacute hemorrhagic stroke. Underlying mass not excluded. 4. Underlying age-related cerebral atrophy with chronic microvascular ischemic disease. These  results were communicated to Dr. Otelia Limes at 1:01 am on 05/02/2023 by text page via the [facility] messaging system. Electronically Signed   By: Rise Mu M.D.   On: 05/02/2023 01:04    Vitals:   05/02/23 0215 05/02/23 0545 05/02/23 0744 05/02/23 0800  BP: (!) 152/65 (!) 131/58 (!) 146/82 139/67  Pulse: (!) 59 60 65 64  Resp: 19 16 18 17   Temp:   97.9 F (36.6 C)   TempSrc:   Oral   SpO2: 98% 99% 97% 97%  Weight:         PHYSICAL EXAM General:  Alert, critically ill female Psych:  Mood and affect appropriate for situation CV: Regular rate and rhythm on monitor Respiratory:  Regular, unlabored respirations on room air GI: Abdomen soft and nontender     NEURO:  Mental Status: awake and alert, oriented to self and age, stated month "December" year as "2026".  Can follow most commands.  Speech is dysarthric, no aphasia.    Cranial Nerves:  II: PERRL. Visual fields left hemianopia, no blink to threat on left. III, IV, VI: EOM with forced right gaze, does not cross midline, V: Sensation is intact to light touch and symmetrical to face.  VII: Left facial VIII: hearing intact to voice. IX, X: Palate elevates symmetrically. Phonation is normal.  UU:[id number] shrug 5/5. XII: tongue is midline without fasciculations. Motor: Left arm with some movement but able to hold up for 10 seconds hits the bed, left lower unable to hold against gravity, right arm and right leg spontaneous movement and antigravity.  Sensation decreased on left arm and leg, with left neglect Tone: Increased in left upper arm Sensation-decreased on left side Coordination: FTN intact bilaterally, HKS: no ataxia in BLE.No drift.  Gait- deferred  Most Recent NIH  1a Level of Conscious.: 0 1b LOC Questions: 1 1c LOC Commands: 0 2 Best Gaze1 3 Visual: 2 4 Facial Palsy: 2 5a Motor Arm - left: 2 5b Motor Arm - Right: 0 6a Motor Leg - Left: 2 6b Motor Leg - Right: 0 7 Limb Ataxia: 0 8 Sensory: 1 9 Best  Language: 0 10 Dysarthria: 1 11 Extinct. and Inatten.: 1 TOTAL: 13   ASSESSMENT/PLAN  Acute Ischemic Infarct:  right MCA Etiology:   Cardioembolic in the setting of A-fib not on anticoagulation Code Stroke CT head concerning for acute right MCA  Small vessel disease. Atrophy. ASPECTS 9   CTA head & neck Acute proximal right M2 branch occlusion, inferior division. Filling defect within the cavernous left ICA, likely subocclusive thrombus. MRI PPM need to determine if MRI compatible 2D Echo ordered LDL 83 HgbA1c 8.7 VTE prophylaxis -Lovenox No antithrombotic prior to admission, now on aspirin 81 mg daily and clopidogrel 75 mg daily for 3 weeks and then Plavix alone. Therapy recommendations:  Pending Disposition: Pending  Hx of Stroke/TIA Stroke with residual right-sided weakness  Hypertension Sick sinus syndrome s/p PPM CHF CAD Atrial fibrillation not on anticoagulation Home meds:   isosorbide 10mg , metoprolol 25 mg, torsemide 20 mg, digoxin 0.125 mg Stable Blood Pressure Goal: BP less than 220/110   Hyperlipidemia Home meds: None,  LDL 83, goal < 70 High intensity statin not indicated due to patient's age Continue statin at discharge  Diabetes type II UnControlled Home meds: Insulin HgbA1c 8.7, goal < 7.0 CBGs SSI Recommend close follow-up with PCP for better DM control  Dysphagia Patient has post-stroke dysphagia, SLP consulted    Diet   Diet NPO time specified   Advance diet as tolerated  Other Stroke Risk Factors Coronary artery disease Congestive heart failure    Other Active Problems Anxiety/depression  Hospital day # 0  Gevena Mart DNP, ACNPC-AG  [facility]  I have personally obtained history,examined this patient, reviewed notes, independently viewed imaging studies, participated in medical decision making and plan of care.ROS completed by me personally and pertinent positives fully documented I have made any additions or  clarifications directly to the above note. Agree with note above with the following changes. Patient on hospice and will be going CMO per family at bedside. Neurology will sign off at this time.   Anibal Henderson, MD [facility]  To contact Stroke Continuity provider, please refer to [email or web address]. After hours, contact General Neurology

## 2023-05-02 NOTE — Consult Note (Addendum)
NEUROLOGY CONSULT NOTE   Date of service: May 02, 2023 Patient Name: Royann Wildasin MRN:  [id number] DOB:  11-18-1923 Chief Complaint: Acute onset of left sided weakness Requesting Provider: Dione Booze, MD  History of Present Illness  Zienna Ahlin is a 89 y.o. female with hx of A-fib (not currently on anticoagulation), Anemia, Anxiety, Arthritis, Breast cancer (1978), CHF, CKD, Depression, Diabetes mellitus without complication, Diverticulitis, Dysrhythmia, GERD, Hyperlipidemia, Hypertension, Macular degeneration of both eyes, Mitral valve prolapse, Mitral valve regurgitation, Presence of permanent cardiac pacemaker, and Stroke with residual right sided weakness who presents as a Code Stroke to the ED from her SNF via EMS for new onset of left sided weakness, right gaze deviation, dysarthria and left hemineglect. BP per EMS was 138/82.  LKW: 8:45 PM Modified rankin score: No detailed information given by EMS; she is a SNF patient requiring memory care IV Thrombolysis: No: Subacute right cerebellar stroke with hemorrhagic conversion is seen on CT EVT: No: Debilitated at baseline. Risks of morbidity/mortality due to hemorrhagic complications significantly outweigh benefits given underlying debility and frailty   NIHSS components Score: Comment  1a Level of Conscious 0[x]  1[]  2[]  3[]      1b LOC Questions 0[]  1[x]  2[]      Not oriented to month  1c LOC Commands 0[x]  1[]  2[]       2 Best Gaze 0[]  1[x]  2[]      Right gaze preference  3 Visual 0[]  1[]  2[x]  3[]     Left visual field cut  4 Facial Palsy 0[]  1[]  2[x]  3[]     Prominent left facial droop  5a Motor Arm - left 0[]  1[]  2[x]  3[]  4[]  UN[]   Drops to bed in < 10 sec  5b Motor Arm - Right 0[x]  1[]  2[]  3[]  4[]  UN[]    6a Motor Leg - Left 0[]  1[]  2[x]  3[]  4[]  UN[]   Drops to bed in < 5 sec  6b Motor Leg - Right 0[x]  1[]  2[]  3[]  4[]  UN[]    7 Limb Ataxia 0[x]  1[]  2[]  3[]  UN[]     8 Sensory 0[]  1[x]  2[]  UN[]     Decreased to LUE and LLE   9 Best Language 0[x]  1[]  2[]  3[]      10 Dysarthria 0[]  1[x]  2[]  UN[]       11 Extinct. and Inattention 0[]  1[x]  2[]      Left hemineglect  TOTAL:   13      ROS  Unable to ascertain due to cognitive/communication deficit.   Past History   Past Medical History:  Diagnosis Date   A-fib (HCC)    Anemia    Anxiety    Arthritis    Breast cancer (HCC) 1978   left breast with lymph node removal   CHF (congestive heart failure) (HCC)    Chronic kidney disease    Depression    Diabetes mellitus without complication (HCC)    Diverticulitis    Dysrhythmia    GERD (gastroesophageal reflux disease)    Hyperlipidemia    Hypertension    Macular degeneration of both eyes    Mitral valve prolapse    Mitral valve regurgitation    Presence of permanent cardiac pacemaker    Stroke [facility])     Past Surgical History:  Procedure Laterality Date   ABDOMINAL HYSTERECTOMY     APPENDECTOMY     BREAST SURGERY     CARDIAC CATHETERIZATION     ESOPHAGEAL DILATION     EYE SURGERY Bilateral    Cataract Extraction with IOL   INSERT /  REPLACE / REMOVE PACEMAKER     IRRIGATION AND DEBRIDEMENT HEMATOMA Left 08/21/2015   Procedure: IRRIGATION AND DEBRIDEMENT HEMATOMA;  Surgeon: Earline Mayotte, MD;  Location: [facility] ORS;  Service: General;  Laterality: Left;   KNEE ARTHROSCOPY Right    LEFT OOPHORECTOMY Left    MASTECTOMY Left 1978   MASTECTOMY Right 1978   OPEN REDUCTION INTERNAL FIXATION (ORIF) DISTAL RADIAL FRACTURE Right 02/14/2018   Procedure: OPEN REDUCTION INTERNAL FIXATION (ORIF) DISTAL RADIAL FRACTURE;  Surgeon: Kennedy Bucker, MD;  Location: [facility] ORS;  Service: Orthopedics;  Laterality: Right;   PACEMAKER INSERTION  08/11/12   PPM GENERATOR CHANGEOUT N/A 05/02/2020   Procedure: PPM GENERATOR CHANGEOUT;  Surgeon: Marcina Millard, MD;  Location: [facility] INVASIVE CV LAB;  Service: Cardiovascular;  Laterality: N/A;   TEMPORAL ARTERY BIOPSY / LIGATION     TONSILLECTOMY      Family  History: Family History  Problem Relation Age of Onset   Hypertension Mother     Social History  reports that she has never smoked. She has never used smokeless tobacco. She reports that she does not drink alcohol and does not use drugs.  Allergies  Allergen Reactions   Codeine Nausea Only   Disopyramide     Other reaction(s): Unknown   Ibuprofen Diarrhea   Iodine     blisters   Metformin And Related Other (See Comments)    unknown   Norpace [Disopyramide Phosphate]    Quinidine     Other reaction(s): Unknown   Terfenadine     Other reaction(s): Unknown   Topiramate     Other reaction(s): Other (See Comments) Hair loss   Verapamil     Other reaction(s): Unknown   Dexamethasone Sodium Phosphate Palpitations    Medications   Current Facility-Administered Medications:    sodium chloride flush (NS) 0.9 % injection 3 mL, 3 mL, Intravenous, Once, Cardama, Amadeo Garnet, MD  Current Outpatient Medications:    acetaminophen (TYLENOL) 325 MG tablet, Take 2 tablets (650 mg total) by mouth every 6 (six) hours as needed for mild pain., Disp: , Rfl:    allopurinol (ZYLOPRIM) 100 MG tablet, Take 100 mg by mouth daily., Disp: , Rfl:    apixaban (ELIQUIS) 2.5 MG TABS tablet, Take 2.5 mg by mouth 2 (two) times daily., Disp: , Rfl:    Cholecalciferol (VITAMIN D) 50 MCG (2000 UT) tablet, Take 2,000 Units by mouth daily., Disp: , Rfl:    cyanocobalamin (VITAMIN B12) 1000 MCG tablet, Take 1 tablet (1,000 mcg total) by mouth daily., Disp: 30 tablet, Rfl: 1   diclofenac Sodium (VOLTAREN) 1 % GEL, Apply 2 g topically 3 (three) times daily. (Apply to knees), Disp: , Rfl:    digoxin (LANOXIN) 0.125 MG tablet, Take 1 tablet (0.125 mg total) by mouth daily., Disp: , Rfl:    docusate sodium (COLACE) 100 MG capsule, Take 100 mg by mouth 2 (two) times daily., Disp: , Rfl:    empagliflozin (JARDIANCE) 10 MG TABS tablet, Take by mouth daily., Disp: , Rfl:    ferrous sulfate 325 (65 FE) MG EC tablet,  Take 1 tablet (325 mg total) by mouth 2 (two) times daily., Disp: 60 tablet, Rfl: 3   glipiZIDE (GLUCOTROL) 10 MG tablet, Take 10 mg by mouth daily., Disp: , Rfl:    insulin glargine-yfgn (SEMGLEE) 100 UNIT/ML injection, Inject 0.09 mLs (9 Units total) into the skin 2 (two) times daily., Disp: , Rfl:    isosorbide mononitrate (IMDUR) 30 MG 24 hr tablet, Take  30 mg by mouth daily., Disp: , Rfl:    metolazone (ZAROXOLYN) 2.5 MG tablet, Take 1 tablet (2.5 mg total) by mouth daily as needed. Home med. (Patient taking differently: Take 2.5 mg by mouth daily as needed (excessive fluid retention).), Disp: , Rfl:    metoprolol succinate (TOPROL-XL) 50 MG 24 hr tablet, Take 1 tablet (50 mg total) by mouth daily., Disp: 30 tablet, Rfl: 0   nitroGLYCERIN (NITROSTAT) 0.4 MG SL tablet, Place 0.4 mg under the tongue every 5 (five) minutes as needed for chest pain., Disp: , Rfl:    pantoprazole (PROTONIX) 40 MG tablet, Take 40 mg by mouth 2 (two) times daily., Disp: , Rfl:    polyethylene glycol (MIRALAX / GLYCOLAX) 17 g packet, Take 17 g by mouth daily., Disp: 14 each, Rfl: 0   potassium chloride (MICRO-K) 10 MEQ CR capsule, Take 40 mEq by mouth 2 (two) times daily., Disp: , Rfl:    senna (SENOKOT) 8.6 MG TABS tablet, Take 2 tablets by mouth 2 (two) times daily., Disp: , Rfl:    sertraline (ZOLOFT) 25 MG tablet, Take 25 mg by mouth daily., Disp: , Rfl:    simethicone (MYLICON) 125 MG chewable tablet, Chew 250 mg by mouth daily as needed for flatulence., Disp: , Rfl:    sodium chloride (OCEAN) 0.65 % SOLN nasal spray, Place 2 sprays into both nostrils every 6 (six) hours as needed for congestion., Disp: , Rfl:    torsemide (DEMADEX) 20 MG tablet, Take 40 mg by mouth 2 (two) times daily., Disp: , Rfl:   Vitals   Vitals:   05/02/23 0000  Weight: 45.2 kg    Body mass index is 17.1 kg/m.    Physical Exam   HEENT: [place]/AT Lungs: Respirations unlabored Ext: No edema  Neurologic Examination   See  annotated NIHSS above.    Labs/Imaging/Neurodiagnostic studies   CBC: No results for input(s): "WBC", "NEUTROABS", "HGB", "HCT", "MCV", "PLT" in the last 168 hours. Basic Metabolic Panel:  Lab Results  Component Value Date   NA 138 02/12/2022   K 3.2 (L) 02/12/2022   CO2 29 02/12/2022   GLUCOSE 183 (H) 02/12/2022   BUN 23 02/12/2022   CREATININE 1.20 (H) 02/12/2022   CALCIUM 10.1 02/12/2022   GFRNONAA 41 (L) 02/12/2022   GFRAA >60 10/24/2019   Lipid Panel: No results found for: "LDLCALC" HgbA1c:  Lab Results  Component Value Date   HGBA1C 8.9 (H) 02/06/2022   Urine Drug Screen: No results found for: "LABOPIA", "COCAINSCRNUR", "LABBENZ", "AMPHETMU", "THCU", "LABBARB"  Alcohol Level No results found for: "ETH" INR  Lab Results  Component Value Date   INR 1.0 04/25/2021   APTT  Lab Results  Component Value Date   APTT 33 04/25/2021     ASSESSMENT  89 y.o. female with hx of A-fib (not currently on anticoagulation), Anemia, Anxiety, Arthritis, Breast cancer (1978), CHF, CKD, Depression, Diabetes mellitus without complication, Diverticulitis, Dysrhythmia, GERD, Hyperlipidemia, Hypertension, Macular degeneration of both eyes, Mitral valve prolapse, Mitral valve regurgitation, Presence of permanent cardiac pacemaker, and Stroke with residual right sided weakness who presents as a Code Stroke to the ED from her SNF via EMS for new onset of left sided weakness, right gaze deviation, dysarthria and left hemineglect. BP per EMS was 138/82. LKW: 8:45 PM.  - Exam reveals findings best localizable to the right cerebral hemisphere. Most likely etiology is acute ischemic stroke.  - CT head: Findings concerning for acute right MCA distribution infarct. Associated dense right  MCA branch at the Sylvian fissure, suspicious for thrombus. Aspects = 7. Also noted is a 3.3 cm hypodense area with heterogeneous central hyperdensity within the right cerebellum; this finding is favored to reflect an  evolving subacute hematoma and/or subacute hemorrhagic stroke. Underlying mass not excluded. Underlying age-related cerebral atrophy with chronic microvascular ischemic disease. - Not a TNK candidate: Subacute right cerebellar stroke with hemorrhagic conversion is seen on CT - Not a thrombectomy candidate: Debilitated at baseline. Risks of morbidity/mortality due to hemorrhagic complications significantly outweigh benefits given underlying debility and frailty - Labs: BUN elevated at 28. Cr elevated at 1.10. Na and K normal. Ionized Ca slightly low at 1.14. Glucose 181. CBC normal.  - Overall impression: Acute right MCA territory ischemic stroke. Not a TNK or thrombectomy candidate (see above)   RECOMMENDATIONS  1. HgbA1c, fasting lipid panel 2. MRI of the brain without contrast, if her pacemaker is MRI-compatible 3. PT consult, OT consult, Speech consult 4. Echocardiogram 5. Not a good candidate for a statin given her advanced age and frailty.  6. CTA of head and neck  7. Risk factor modification 8. Telemetry monitoring 9. Frequent neuro checks 10. NPO until passes stroke swallow screen 11. Modified permissive HTN protocol given advanced age. Treat SBP if > 180 12. ASA 325 mg po now, then 81 mg po every day. If unable to take PO, administer per rectum 13. Stroke Team to follow in the AM   Addendum: - CTA of head and neck:  1. Acute proximal right M2 branch occlusion, inferior division. 2. Filling defect within the cavernous left ICA, likely subocclusive thrombus. 3. No vascular abnormality seen underlying the previously identified right cerebellar hemorrhage. 4. Moderate atheromatous change about the carotid bifurcations with up to 60% stenosis on the left. 5. Moderate atheromatous stenosis at the origin of the right vertebral artery. 6. Two separate enhancing lesions overlying the left frontal convexity, likely small meningiomas. 7.  Aortic Atherosclerosis  - Will not add Plavix  as localization by exam is suggestive of a large cortically based infarction - She continues not to be a candidate for endovascular procedure due to high risk of luxury reperfusion and hemorrhage, risk of vessel wall trauma from catheter with subarachnoid hemorrhage, her extreme old age, frailty and debility ______________________________________________________________________    Dessa Phi, Azalee Weimer, MD [facility]

## 2023-05-02 NOTE — Progress Notes (Signed)
Elizabeth Novak ED rm 11 [facility] Patient Visit   [name] is a current [facility] patient with a terminal diagnosis of hypertensive heart with chronic kidney disease stage 3 with heart failure. Patient resides at [facility] and was noted to have stroke symptoms and EMS was activated. [facility] was notified of this after patient in route to hospital. She has been admitted on 1.26.25 with a diagnosis of acute CVA. Per Dr. Gordy Savers with [facility] this is a related hospital admission.   Visited patient at bedside, she is resting with eyes closed, no family present. She has no outward symptoms of distress. Patient very hard of hearing but does attempt to answer questions, however her speech is slurred. She is noted to have a pronounced right side gaze.    She is inpatient appropriate with need testing and monitoring related to acute CVA.   Vital Signs: 98/70/19    146/67    spO2 97% room air  I&O: not yet recorded  Abnormal labs: K+ 2.8, Ca+ 8.7, Albumin 3  Diagnostics:  CT ANGIOGRAPHY HEAD AND NECK WITH AND WITHOUT CONTRAST   IMPRESSION: 1. Acute proximal right M2 branch occlusion, inferior division. 2. Filling defect within the cavernous left ICA, likely subocclusive thrombus. 3. No vascular abnormality seen underlying the previously identified right cerebellar hemorrhage. 4. Moderate atheromatous change about the carotid bifurcations with up to 60% stenosis on the left. 5. Moderate atheromatous stenosis at the origin of the right vertebral artery. 6. Two separate enhancing lesions overlying the left frontal convexity, likely small meningiomas. 7.  Aortic Atherosclerosis (ICD10-I70.0).   These results were communicated to Dr. Otelia Limes at 4:09 am on 05/02/2023 by text page via the [facility] messaging system.   Electronically Signed   By: Rise Mu M.D.   On: 05/02/2023 04:13   IV/PRN Meds: D5NS @ 62ml/H IV  Problem List as per  Dr. Rickey Barbara 1.26  Acute CVA -Patient not a candidate for tPA.  Concern for hemorrhagic conversion and age -Unable to do MRI overnight, patient with pacemaker.   -CT head concerning for R MCA stroke -CTA head and neck with R M2 branch occlusion, inferior division, filling defect w/in the cavernous L ICA -Neurology following. Recs for ASA 81mg  daily and plavix 75mg  daily x 3 weeks, then plavix alone -Therapy recs for LTC    T2DM -Sliding scale ordered -A1c 8.7    Atrial fibrillation (HCC) -Not on anticoagulation.   -rate controlled    CAD //  HTN (hypertension) -BP meds were on hold to allow for permissive hypertension.    Sick sinus syndrome (HCC) -S/P placement of cardiac pacemaker    Anxiety with depression //  Alzheimer's dementia -Not on medications    Warm autoimmune hemolytic anemia (HCC)   Post-CVA dysphagia -Did not pass swallow and remains NPO -For now, continue basal D5 fluids -SLP following -Will f/u with [facility].  -Consult Palliative Care  Discharge Planning: Ongoing Family Contact: no family present IDT: Updated Goals of Care: DNR  Please don't hesitate to reach out for any hospice related questions or concerns.  Thea Gist, BSN RN Hospice hospital liaison [phone]

## 2023-05-02 NOTE — Evaluation (Signed)
Occupational Therapy Evaluation Patient Details Name: Elizabeth Novak MRN: [id number] DOB: 01/06/1924 Today's Date: 05/02/2023   History of Present Illness 89 y.o. female presents to [facility] 05/02/23 as a  code stroke with right gaze preference, left hemianopia, left facial droop, left arm weakness and left leg weakness. CT head showed acute right MCA infarct and possible subacute hematoma and/or subacute hemorrhagic stroke in right cerebellum. PMHx: T2DM, a-fib, CAD, HTN, sick sinus syndome s/p pacemaker, alzheimer's disease, CVA w/ residual R sided weakness   Clinical Impression   Per nephew, Elizabeth Novak utilizes w/c at baseline for mobility, states facility assists with medications. Elizabeth Novak currently needing total +2 for bed mobility and is dependent for all ADLs. Elizabeth Novak with LUE weakness and does not deviate from R gaze/head turn throughout session. Elizabeth Novak with L lateral lean at EOB and able to sit EOB ~3 mins. Elizabeth Novak presenting with impairments listed below, will follow acutely as appropriate to maximize safety/ind with ADL/functional mobility.       If plan is discharge home, recommend the following: Two people to help with walking and/or transfers;Two people to help with bathing/dressing/bathroom;Assistance with cooking/housework;Assistance with feeding;Direct supervision/assist for medications management;Direct supervision/assist for financial management;Assist for transportation;Help with stairs or ramp for entrance;Supervision due to cognitive status    Functional Status Assessment  Patient has had a recent decline in their functional status and demonstrates the ability to make significant improvements in function in a reasonable and predictable amount of time.  Equipment Recommendations  Other (comment) (defer)    Recommendations for Other Services Other (comment) (palliative)     Precautions / Restrictions Precautions Precautions: Fall Precaution Comments: prior CVA w/ R sided weakness, new L sided  deficits Restrictions Weight Bearing Restrictions Per Provider Order: No      Mobility Bed Mobility Overal bed mobility: Needs Assistance Bed Mobility: Sidelying to Sit, Sit to Sidelying   Sidelying to sit: Total assist, +2 for physical assistance     Sit to sidelying: Total assist, +2 for physical assistance      Transfers                   General transfer comment: deferred      Balance Overall balance assessment: Needs assistance Sitting-balance support: Feet supported Sitting balance-Leahy Scale: Poor Sitting balance - Comments: L lateral and posterior lean                                   ADL either performed or assessed with clinical judgement   ADL                                         General ADL Comments: dependent for all ADLs at this time     Vision Baseline Vision/History: 6 Macular Degeneration Ability to See in Adequate Light: 1 Impaired Additional Comments: Elizabeth Novak unable to participate in visual assessment, noted Elizabeth Novak with R gaze, did not track to midline or to L when cued or during session     Perception Perception: Not tested       Praxis Praxis: Not tested       Pertinent Vitals/Pain Pain Assessment Pain Assessment: Faces Pain Score: 4  Faces Pain Scale: Hurts little more Pain Location: generalized with movement Pain Descriptors / Indicators: Grimacing, Guarding Pain Intervention(s): Limited activity within patient's tolerance, Monitored  during session, Repositioned     Extremity/Trunk Assessment Upper Extremity Assessment Upper Extremity Assessment: Generalized weakness;LUE deficits/detail LUE Deficits / Details: noted minimal movement Elizabeth Novak able to raise arm to cross arms because she is cold, did not follow commands for ROM testing   Lower Extremity Assessment Lower Extremity Assessment: Defer to Elizabeth Novak evaluation   Cervical / Trunk Assessment Cervical / Trunk Assessment: Kyphotic;Other  exceptions Cervical / Trunk Exceptions: L lean   Communication Communication Communication: Difficulty communicating thoughts/reduced clarity of speech;Difficulty following commands/understanding (hx aphasia per chart review)   Cognition Arousal: Lethargic Behavior During Therapy: WFL for tasks assessed/performed Overall Cognitive Status: Impaired/Different from baseline Area of Impairment: Orientation, Attention, Memory, Following commands, Safety/judgement, Awareness, Problem solving                 Orientation Level: Situation, Time, Place Current Attention Level: Focused   Following Commands: Follows one step commands inconsistently Safety/Judgement: Decreased awareness of deficits Awareness: Emergent Problem Solving: Slow processing, Decreased initiation, Difficulty sequencing, Requires verbal cues, Requires tactile cues General Comments: Elizabeth Novak repeatedly stating that she needs to use the bathroom, able to state her name     General Comments  VSS, Elizabeth Novak's nephew present    Exercises     Shoulder Instructions      Home Living Family/patient expects to be discharged to:: Unsure   Available Help at Discharge: Other (Comment) (was at memory care ALF) Type of Home: Other(Comment) (Memory care ALF)                           Additional Comments: uncle reports Elizabeth Novak may not be able to get assistance needed at current ALF, deciding if Elizabeth Novak will progress to palliative/comfort care      Prior Functioning/Environment Prior Level of Function : History of Falls (last six months)             Mobility Comments: Elizabeth Novak unable to describe PLOF, uncle reports Elizabeth Novak has been non-ambulatory for "awhile" and has been stand/pivoting independently. However, reports Elizabeth Novak has been declining with mobility prior to this CVA. ADLs Comments: uncle reports Elizabeth Novak was independent with ADLs        OT Problem List: Decreased strength;Decreased range of motion;Decreased activity tolerance;Impaired  balance (sitting and/or standing);Impaired vision/perception;Decreased coordination;Decreased cognition;Decreased knowledge of use of DME or AE;Decreased safety awareness;Decreased knowledge of precautions;Cardiopulmonary status limiting activity;Impaired sensation;Impaired tone;Impaired UE functional use      OT Treatment/Interventions: Self-care/ADL training;Therapeutic exercise;Energy conservation;DME and/or AE instruction;Therapeutic activities;Balance training;Patient/family education;Visual/perceptual remediation/compensation;Cognitive remediation/compensation;Neuromuscular education    OT Goals(Current goals can be found in the care plan section) Acute Rehab OT Goals Patient Stated Goal: Elizabeth Novak unable to state OT Goal Formulation: With patient Time For Goal Achievement: 05/16/23 Potential to Achieve Goals: Good  OT Frequency: Min 1X/week    Co-evaluation Elizabeth Novak/OT/SLP Co-Evaluation/Treatment: Yes Reason for Co-Treatment: Complexity of the patient's impairments (multi-system involvement);For patient/therapist safety;To address functional/ADL transfers   OT goals addressed during session: Strengthening/ROM;ADL's and self-care      AM-PAC OT "6 Clicks" Daily Activity     Outcome Measure Help from another person eating meals?: Total Help from another person taking care of personal grooming?: Total Help from another person toileting, which includes using toliet, bedpan, or urinal?: Total Help from another person bathing (including washing, rinsing, drying)?: Total Help from another person to put on and taking off regular upper body clothing?: Total Help from another person to put on and taking off regular lower body clothing?: Total  6 Click Score: 6   End of Session Nurse Communication: Mobility status  Activity Tolerance: Patient limited by lethargy Patient left: in bed;with call bell/phone within reach;with family/visitor present  OT Visit Diagnosis: Unsteadiness on feet (R26.81);Other  abnormalities of gait and mobility (R26.89);Muscle weakness (generalized) (M62.81);Low vision, both eyes (H54.2);Other symptoms and signs involving the nervous system (R29.898);Other symptoms and signs involving cognitive function                Time: 1330-1355 OT Time Calculation (min): 25 min Charges:  OT General Charges $OT Visit: 1 Visit OT Evaluation $OT Eval Moderate Complexity: 1 Mod  Abubakr Wieman K, OTD, OTR/L SecureChat Preferred Acute Rehab [phone]   Carver Fila Koonce 05/02/2023, 3:44 PM

## 2023-05-02 NOTE — ED Notes (Signed)
Family updated as to patient's status. [name], Niece and POA.

## 2023-05-02 NOTE — Progress Notes (Signed)
  Progress Note   Patient: Elizabeth Novak ZOX:[id number] DOB: December 23, 1923 DOA: 05/02/2023     0 DOS: the patient was seen and examined on 05/02/2023   Brief hospital course: 89 y/o female with PMHx of atrual fibrillation, CHF., T2DM, HLD, HTN, anxiety and depression, dementia, SSS sp PPM, history of CVA with residual right-sided weakness.  Patient's rehab center SNF memory care unit, patient brought in as a code stroke.  Patient with right gaze preference, left hemianopia, left facial droop, left arm weakness, left leg weakness   Presenting vitals 143/65, 28F, 16, afebrile. CT head shows acute right MCA distribution infarct, associated dense right MCA branch at the Sylvian fissure, suspicious for thrombus.  Possible subacute hematoma and/or subacute hemorrhagic stroke in the right cerebellum   Assessment and Plan:  Acute CVA -Patient not a candidate for tPA.  Concern for hemorrhagic conversion and age -Unable to do MRI overnight, patient with pacemaker.   -CT head concerning for R MCA stroke -CTA head and neck with R M2 branch occlusion, inferior division, filling defect w/in the cavernous L ICA -Neurology following. Recs for ASA 81mg  daily and plavix 75mg  daily x 3 weeks, then plavix alone -Therapy recs for LTC    T2DM -Sliding scale ordered -A1c 8.7    Atrial fibrillation (HCC) -Not on anticoagulation.   -rate controlled    CAD //  HTN (hypertension) -BP meds were on hold to allow for permissive hypertension.    Sick sinus syndrome (HCC) -S/P placement of cardiac pacemaker    Anxiety with depression //  Alzheimer's dementia -Not on medications    Warm autoimmune hemolytic anemia (HCC)  Post-CVA dysphagia -Did not pass swallow and remains NPO -For now, continue basal D5 fluids -SLP following -Will f/u with [facility].  -Consult Palliative Care   Subjective: Unable to assess at this time  Physical Exam: Vitals:   05/02/23 1300 05/02/23 1534 05/02/23 1600 05/02/23 1700   BP: 121/65 134/62  133/66  Pulse: 62 75 68 71  Resp: 13 (!) 21 20 17   Temp:  98.3 F (36.8 C)    TempSrc:      SpO2: 97% 97% 95% 95%  Weight:       General exam: Awake, laying in bed, in nad Respiratory system: Normal respiratory effort, no wheezing Cardiovascular system: regular rate, s1, s2 Gastrointestinal system: Soft, nondistended, positive BS Central nervous system: L sided weakness, L sided facial droop Extremities: Perfused, no clubbing Skin: Normal skin turgor, no notable skin lesions seen Psychiatry: Unable to assess given mentation  Data Reviewed:  Labs reviewed: Na 137, K 2.8, Cr 0.86  Family Communication: Pt in room, family not at bedside  Disposition: Status is: Inpatient Remains inpatient appropriate because: severity of illness  Planned Discharge Destination: Skilled nursing facility     Author: Rickey Barbara, MD 05/02/2023 6:15 PM  For on call review [email or web address].

## 2023-05-02 NOTE — H&P (Signed)
PCP:   Levada Dy Complaint:  Code stroke  HPI: This is a 89 y/o female with PMHx of atrual fibrillation, CHF., T2DM, HLD, HTN, anxiety and depression, dementia, SSS sp PPM, history of CVA with residual right-sided weakness.  Patient's rehab center SNF memory care unit, patient brought in as a code stroke.  Patient with right gaze preference, left hemianopia, left facial droop, left arm weakness, left leg weakness  Presenting vitals 143/65, 68F, 16, afebrile. CT head shows acute right MCA distribution infarct, associated dense right MCA branch at the Sylvian fissure, suspicious for thrombus.  Possible subacute hematoma and/or subacute hemorrhagic stroke in the right cerebellum   [employer], [employer] (formerly known as [facility]), please call [phone] for [place] or [phone]   Review of Systems:  Unable to obtain Past Medical History: Past Medical History:  Diagnosis Date   A-fib (HCC)    Anemia    Anxiety    Arthritis    Breast cancer (HCC) 1978   left breast with lymph node removal   CHF (congestive heart failure) (HCC)    Chronic kidney disease    Depression    Diabetes mellitus without complication (HCC)    Diverticulitis    Dysrhythmia    GERD (gastroesophageal reflux disease)    Hyperlipidemia    Hypertension    Macular degeneration of both eyes    Mitral valve prolapse    Mitral valve regurgitation    Presence of permanent cardiac pacemaker    Stroke [facility])    Past Surgical History:  Procedure Laterality Date   ABDOMINAL HYSTERECTOMY     APPENDECTOMY     BREAST SURGERY     CARDIAC CATHETERIZATION     ESOPHAGEAL DILATION     EYE SURGERY Bilateral    Cataract Extraction with IOL   INSERT / REPLACE / REMOVE PACEMAKER     IRRIGATION AND DEBRIDEMENT HEMATOMA Left 08/21/2015   Procedure: IRRIGATION AND DEBRIDEMENT HEMATOMA;  Surgeon: Earline Mayotte, MD;  Location: [facility] ORS;   Service: General;  Laterality: Left;   KNEE ARTHROSCOPY Right    LEFT OOPHORECTOMY Left    MASTECTOMY Left 1978   MASTECTOMY Right 1978   OPEN REDUCTION INTERNAL FIXATION (ORIF) DISTAL RADIAL FRACTURE Right 02/14/2018   Procedure: OPEN REDUCTION INTERNAL FIXATION (ORIF) DISTAL RADIAL FRACTURE;  Surgeon: Kennedy Bucker, MD;  Location: [facility] ORS;  Service: Orthopedics;  Laterality: Right;   PACEMAKER INSERTION  08/11/12   PPM GENERATOR CHANGEOUT N/A 05/02/2020   Procedure: PPM GENERATOR CHANGEOUT;  Surgeon: Marcina Millard, MD;  Location: [facility] INVASIVE CV LAB;  Service: Cardiovascular;  Laterality: N/A;   TEMPORAL ARTERY BIOPSY / LIGATION     TONSILLECTOMY      Medications: Prior to Admission medications   Medication Sig Start Date End Date Taking? Authorizing Provider  ANTI-DIARRHEAL 2 MG tablet Take 1 tablet by mouth every 6 (six) hours. 03/30/23  Yes [provider]  busPIRone (BUSPAR) 5 MG tablet Take 5 mg by mouth 3 (three) times daily. 04/28/23  Yes [provider]  ciprofloxacin (CIPRO) 500 MG tablet Take 500 mg by mouth 2 (two) times daily. 04/13/23  Yes [provider]  famotidine (PEPCID) 20 MG tablet Take 20 mg by mouth daily. 04/28/23  Yes [provider]  isosorbide dinitrate (ISORDIL) 10 MG tablet Take 10 mg by mouth 2 (two) times daily. 04/28/23  Yes [provider]  LANTUS SOLOSTAR 100 UNIT/ML Solostar Pen  Inject into the skin. 04/29/23  Yes [provider]  metoprolol tartrate (LOPRESSOR) 25 MG tablet Take 25 mg by mouth 2 (two) times daily. 04/28/23  Yes [provider]  QUEtiapine (SEROQUEL) 25 MG tablet Take by mouth. 04/12/23  Yes [provider]  sertraline (ZOLOFT) 100 MG tablet Take 100 mg by mouth daily. 04/28/23  Yes [provider]  XIFAXAN 200 MG tablet Take by mouth. 01/12/23  Yes [provider]  acetaminophen (TYLENOL) 325 MG tablet Take 2 tablets (650 mg total) by mouth every 6  (six) hours as needed for mild pain. 04/29/21   Leeroy Bock, MD  allopurinol (ZYLOPRIM) 100 MG tablet Take 100 mg by mouth daily.    [provider]  apixaban (ELIQUIS) 2.5 MG TABS tablet Take 2.5 mg by mouth 2 (two) times daily.    [provider]  Cholecalciferol (VITAMIN D) 50 MCG (2000 UT) tablet Take 2,000 Units by mouth daily.    [provider]  cyanocobalamin (VITAMIN B12) 1000 MCG tablet Take 1 tablet (1,000 mcg total) by mouth daily. 02/06/22   Arnetha Courser, MD  diclofenac Sodium (VOLTAREN) 1 % GEL Apply 2 g topically 3 (three) times daily. (Apply to knees)    [provider]  digoxin (LANOXIN) 0.125 MG tablet Take 1 tablet (0.125 mg total) by mouth daily. 08/02/21   Lurene Shadow, MD  docusate sodium (COLACE) 100 MG capsule Take 100 mg by mouth 2 (two) times daily.    [provider]  empagliflozin (JARDIANCE) 10 MG TABS tablet Take by mouth daily.    [provider]  ferrous sulfate 325 (65 FE) MG EC tablet Take 1 tablet (325 mg total) by mouth 2 (two) times daily. 02/06/22 02/06/23  Arnetha Courser, MD  glipiZIDE (GLUCOTROL) 10 MG tablet Take 10 mg by mouth daily.    [provider]  insulin glargine-yfgn (SEMGLEE) 100 UNIT/ML injection Inject 0.09 mLs (9 Units total) into the skin 2 (two) times daily. 08/02/21   Lurene Shadow, MD  isosorbide mononitrate (IMDUR) 30 MG 24 hr tablet Take 30 mg by mouth daily.    [provider]  metolazone (ZAROXOLYN) 2.5 MG tablet Take 1 tablet (2.5 mg total) by mouth daily as needed. Home med. Patient taking differently: Take 2.5 mg by mouth daily as needed (excessive fluid retention). 07/03/21   Darlin Priestly, MD  metoprolol succinate (TOPROL-XL) 50 MG 24 hr tablet Take 1 tablet (50 mg total) by mouth daily. 05/10/18   Sharee Holster, NP  nitroGLYCERIN (NITROSTAT) 0.4 MG SL tablet Place 0.4 mg under the tongue every 5 (five) minutes as needed for chest pain.    [provider]  pantoprazole (PROTONIX) 40 MG tablet Take 40 mg by mouth 2 (two) times daily.    [provider]  polyethylene glycol (MIRALAX / GLYCOLAX) 17 g packet Take 17 g by mouth daily. 04/29/21   Leeroy Bock, MD  potassium chloride (MICRO-K) 10 MEQ CR capsule Take 40 mEq by mouth 2 (two) times daily.    [provider]  senna (SENOKOT) 8.6 MG TABS tablet Take 2 tablets by mouth 2 (two) times daily.    [provider]  sertraline (ZOLOFT) 25 MG tablet Take 25 mg by mouth daily.    [provider]  simethicone (MYLICON) 125 MG chewable tablet Chew 250 mg by mouth daily as needed for flatulence.    [provider]  sodium chloride (OCEAN) 0.65 % SOLN nasal spray  Place 2 sprays into both nostrils every 6 (six) hours as needed for congestion.    [provider]  torsemide (DEMADEX) 20 MG tablet Take 40 mg by mouth 2 (two) times daily.    [provider]    Allergies:   Allergies  Allergen Reactions   Codeine Nausea Only   Disopyramide     Other reaction(s): Unknown   Ibuprofen Diarrhea   Iodine     blisters   Metformin And Related Other (See Comments)    unknown   Norpace [Disopyramide Phosphate]    Quinidine     Other reaction(s): Unknown   Terfenadine     Other reaction(s): Unknown   Topiramate     Other reaction(s): Other (See Comments) Hair loss   Verapamil     Other reaction(s): Unknown   Dexamethasone Sodium Phosphate Palpitations    Social History:  reports that she has never smoked. She has never used smokeless tobacco. She reports that she does not drink alcohol and does not use drugs.  Family History: Family History  Problem Relation Age of Onset   Hypertension Mother     Physical Exam: Vitals:   05/02/23 0000 05/02/23 0200  BP:  (!) 143/65  Pulse:  66  Resp:  14  SpO2:  100%  Weight: 45.2 kg     General:  Alert , talkative confused, very cachectic elderly female Eyes: Eyes deviate to the  right.  ENT: Moist oral mucosa, neck supple, no thyromegaly Lungs: CTA B/L, no use of accessory muscles. PPM left chest wall Cardiovascular: RRR (V paced rhythm), no murmurs. +JVD Abdomen: soft, positive BS, NTND, not an acute abdomen GU: not examined Neuro: unable to accute do neuro exam w/ patients confusion Musculoskeletal: unable to do an accurate exam. No edema Skin: no rash, no subcutaneous crepitation, no decubitus Psych: confused female   Labs on Admission:  Recent Labs    05/02/23 0028 05/02/23 0033  NA 135 136  K 3.8 3.8  CL 105 103  CO2 18*  --   GLUCOSE 190* 181*  BUN 24* 28*  CREATININE 1.02* 1.10*  CALCIUM 9.4  --    Recent Labs    05/02/23 0028  AST 20  ALT 14  ALKPHOS 53  BILITOT 0.6  PROT 5.8*  ALBUMIN 3.3*    Recent Labs    05/02/23 0028 05/02/23 0033  WBC 6.1  --   NEUTROABS 4.1  --   HGB 12.2 12.9  HCT 39.6 38.0  MCV 96.6  --   PLT 205  --     Radiological Exams on Admission: CT HEAD CODE STROKE WO CONTRAST Result Date: 05/02/2023 CLINICAL DATA:  Code stroke. Initial evaluation for acute neuro deficit, stroke suspected. EXAM: CT HEAD WITHOUT CONTRAST TECHNIQUE: Contiguous axial images were obtained from the base of the skull through the vertex without intravenous contrast. RADIATION DOSE REDUCTION: This exam was performed according to the departmental dose-optimization program which includes automated exposure control, adjustment of the mA and/or kV according to patient size and/or use of iterative reconstruction technique. COMPARISON:  CT from 08/01/2021. FINDINGS: Brain: Age-related cerebral atrophy with chronic microvascular ischemic disease. Small remote left cerebellar infarct noted. Remote infarcts involving the right frontal and parietal lobes noted. There is subtle loss of gray-white matter differentiation involving the right insular cortex, suspicious for an evolving right MCA distribution infarct. Basal ganglia maintained. Few  additional scattered patchy hypodensities noted involving the overlying right frontal region (series 2, images 24, 25). Patchy  hypodensity at the posterior right parietal region (series 2, image 23). 3.3 cm hypodense area with heterogeneous central hyperdensity present within the right cerebellum (series 2, image 11). Finding favored to reflect an evolving subacute hematoma and/or hemorrhagic stroke. Underlying mass not excluded. No other acute intracranial hemorrhage or mass lesion. No hydrocephalus or extra-axial fluid collection. Vascular: Dense right MCA branch at the sylvian fissure, suspicious for thrombus (series 2, image 15). Skull: Scalp soft tissues within normal limits.  Calvarium intact. Sinuses/Orbits: Right gaze noted. Paranasal sinuses and mastoid air cells are largely clear. Other: None. ASPECTS [facility] Stroke Program Early CT Score) - Ganglionic level infarction (caudate, lentiform nuclei, internal capsule, insula, M1-M3 cortex): 6 - Supraganglionic infarction (M4-M6 cortex): 1 Total score (0-10 with 10 being normal): 7 IMPRESSION: 1. Findings concerning for acute right MCA distribution infarct as above. Associated dense right MCA branch at the Sylvian fissure, suspicious for thrombus. 2. Aspects = 7. 3. 3.3 cm hypodense area with heterogeneous central hyperdensity within the right cerebellum. Finding favored to reflect an evolving subacute hematoma and/or subacute hemorrhagic stroke. Underlying mass not excluded. 4. Underlying age-related cerebral atrophy with chronic microvascular ischemic disease. These results were communicated to Dr. Otelia Limes at 1:01 am on 05/02/2023 by text page via the [facility] messaging system. Electronically Signed   By: Rise Mu M.D.   On: 05/02/2023 01:04    Assessment/Plan Present on Admission:  Acute CVA -Patient not a candidate for tPA.  Concern for hemorrhagic conversion and age -Unable to do MRI overnight, patient with pacemaker.  Repeat CT head 24  hours after -CTA head and neck pending -Neurohospitalist testing patient admitted recommendations noted below -TIA order set initiated -Neurochecks every 2 hours x 12, then every 4 hours -ASA daily -Permissive Hypertension modified by age treat SBP> 180 -No statin due to patient's age -PT/OT/ST consult   T2DM -Sliding scale ordered -Lantus held   Atrial fibrillation (HCC) -Not on anticoagulation.  Currently V-paced rhythm   CAD //  HTN (hypertension) -BP meds on hold to allow for permissive hypertension.   Sick sinus syndrome (HCC) -S/P placement of cardiac pacemaker   Anxiety with depression //  Alzheimer's dementia -Not on medications   Warm autoimmune hemolytic anemia (HCC)   Lajune Perine 05/02/2023, 2:31 AM

## 2023-05-02 NOTE — Evaluation (Signed)
Physical Therapy Evaluation Patient Details Name: Elizabeth Novak MRN: [id number] DOB: 02/04/1924 Today's Date: 05/02/2023  History of Present Illness  89 y.o. female presents to [facility] 05/02/23 as a  code stroke with right gaze preference, left hemianopia, left facial droop, left arm weakness and left leg weakness. CT head showed acute right MCA infarct and possible subacute hematoma and/or subacute hemorrhagic stroke in right cerebellum. PMHx: T2DM, a-fib, CAD, HTN, sick sinus syndome s/p pacemaker, alzheimer's disease, CVA w/ residual R sided weakness   Clinical Impression  Pt lethargic throughout session with nephew present and answering history questions. According to nephew, pt was able to stand and pivot at baseline and had been non-ambulatory for "awhile". However, nephew reports pt has had increased weakness and a decline in mobility recently. Limited PT eval as pt was lethargic and not able to follow commands well. Pt was totalAx2 for sup/sit mobility and was unable to sit EOB without totalA. Pt had a heavy left lateral and posterior lean. Pt presents to therapy session with decreased balance, strength, and mobility. Pt would benefit from acute skilled PT to address functional impairments. Will continue to follow as family is deciding if pt is progressing towards palliative/comfort care.          If plan is discharge home, recommend the following: A lot of help with walking and/or transfers;A lot of help with bathing/dressing/bathroom;Assistance with cooking/housework;Assistance with feeding;Direct supervision/assist for medications management;Direct supervision/assist for financial management;Assist for transportation;Help with stairs or ramp for entrance   Can travel by private vehicle    No    Equipment Recommendations Other (comment) (TBD)  Recommendations for Other Services  Other (comment) (palliative care)    Functional Status Assessment Patient has had a recent decline in  their functional status and/or demonstrates limited ability to make significant improvements in function in a reasonable and predictable amount of time     Precautions / Restrictions Precautions Precautions: Fall Precaution Comments: prior CVA w/ R sided weakness, new L sided deficits Restrictions Weight Bearing Restrictions Per Provider Order: No      Mobility  Bed Mobility Overal bed mobility: Needs Assistance Bed Mobility: Sidelying to Sit, Sit to Sidelying   Sidelying to sit: Total assist, +2 for physical assistance     Sit to sidelying: Total assist, +2 for physical assistance General bed mobility comments: totalAx2 for all aspects, pt not initiating movement    Transfers  General transfer comment: deferred    Modified Rankin (Stroke Patients Only) Modified Rankin (Stroke Patients Only) Pre-Morbid Rankin Score: Moderately severe disability Modified Rankin: Severe disability     Balance Overall balance assessment: Modified Independent, Needs assistance, History of Falls Sitting-balance support: Bilateral upper extremity supported, Feet supported Sitting balance-Leahy Scale: Zero Sitting balance - Comments: L lateral and posterior lean, totalA to maintain balance Postural control: Posterior lean, Left lateral lean      Pertinent Vitals/Pain Pain Assessment Pain Assessment: Faces Faces Pain Scale: Hurts little more Pain Location: generalized with movement Pain Descriptors / Indicators: Grimacing, Guarding Pain Intervention(s): Limited activity within patient's tolerance, Monitored during session, Repositioned    Home Living Family/patient expects to be discharged to:: Unsure   Available Help at Discharge: Other (Comment) (was at memory care ALF) Type of Home: Other(Comment) (Memory care ALF)      Additional Comments: nephew reports pt may not be able to get assistance needed at current ALF, deciding if pt will progress to palliative/comfort care    Prior  Function Prior Level of Function :  History of Falls (last six months)      Mobility Comments: pt unable to describe PLOF, nephew reports pt has been non-ambulatory for "awhile" and has been stand/pivoting independently. However, reports pt has been declining with mobility prior to this CVA. ADLs Comments: nephew reports pt was independent with ADLs, assist with medq     Extremity/Trunk Assessment   Upper Extremity Assessment Upper Extremity Assessment: Defer to OT evaluation LUE Deficits / Details: noted minimal movement pt able to raise arm to cross arms because she is cold, did not follow commands for ROM testing    Lower Extremity Assessment Lower Extremity Assessment: Difficult to assess due to impaired cognition;LLE deficits/detail;RLE deficits/detail RLE Deficits / Details: pt able to wiggle toes and minimally ankle DF/PF, difficult to further assess due to cognition LLE Deficits / Details: no spontaneous or volitional movement to command, difficult to assess due to cognition    Cervical / Trunk Assessment Cervical / Trunk Assessment: Kyphotic Cervical / Trunk Exceptions: L lean  Communication   Communication Communication: Difficulty communicating thoughts/reduced clarity of speech;Difficulty following commands/understanding (history of aphasia per chart review)  Cognition Arousal: Lethargic Behavior During Therapy: WFL for tasks assessed/performed Overall Cognitive Status: Impaired/Different from baseline Area of Impairment: Orientation, Attention, Memory, Following commands, Safety/judgement, Awareness, Problem solving    Orientation Level: Situation, Time, Place Current Attention Level: Focused   Following Commands: Follows one step commands inconsistently Safety/Judgement: Decreased awareness of deficits Awareness: Emergent Problem Solving: Slow processing, Decreased initiation, Difficulty sequencing, Requires verbal cues, Requires tactile cues General Comments:  oriented to self, would state that she needed to use the bathroom        General Comments General comments (skin integrity, edema, etc.): VSS on RA, nephew present and supportive through session     PT Assessment Patient needs continued PT services  PT Problem List Decreased strength;Decreased range of motion;Decreased activity tolerance;Decreased balance;Decreased mobility;Decreased coordination;Decreased knowledge of use of DME;Decreased safety awareness       PT Treatment Interventions DME instruction;Gait training;Functional mobility training;Therapeutic activities;Therapeutic exercise;Balance training;Neuromuscular re-education;Patient/family education;Wheelchair mobility training    PT Goals (Current goals can be found in the Care Plan section)  Acute Rehab PT Goals Patient Stated Goal: unable to state goal PT Goal Formulation: Patient unable to participate in goal setting Time For Goal Achievement: 05/16/23 Potential to Achieve Goals: Fair    Frequency Min 1X/week     Co-evaluation   Reason for Co-Treatment: Complexity of the patient's impairments (multi-system involvement);For patient/therapist safety;To address functional/ADL transfers PT goals addressed during session: Mobility/safety with mobility;Balance OT goals addressed during session: Strengthening/ROM;ADL's and self-care       AM-PAC PT "6 Clicks" Mobility  Outcome Measure Help needed turning from your back to your side while in a flat bed without using bedrails?: Total Help needed moving from lying on your back to sitting on the side of a flat bed without using bedrails?: Total Help needed moving to and from a bed to a chair (including a wheelchair)?: Total Help needed standing up from a chair using your arms (e.g., wheelchair or bedside chair)?: Total Help needed to walk in hospital room?: Total Help needed climbing 3-5 steps with a railing? : Total 6 Click Score: 6    End of Session   Activity  Tolerance: Patient limited by lethargy Patient left: in bed;with call bell/phone within reach;with family/visitor present Nurse Communication: Mobility status PT Visit Diagnosis: Other abnormalities of gait and mobility (R26.89);History of falling (Z91.81);Muscle weakness (generalized) (M62.81);Hemiplegia and hemiparesis Hemiplegia -  Right/Left:  (residual R sided weakness, acute onset of L sided weakness) Hemiplegia - caused by: Cerebral infarction    Time: 1330-1355 PT Time Calculation (min) (ACUTE ONLY): 25 min   Charges:   PT Evaluation $PT Eval Moderate Complexity: 1 Mod   PT General Charges $$ ACUTE PT VISIT: 1 Visit         Hilton Cork, PT, DPT Secure Chat Preferred  Rehab Office [phone]   Arturo Morton Brion Aliment 05/02/2023, 4:00 PM

## 2023-05-03 ENCOUNTER — Inpatient Hospital Stay (HOSPITAL_COMMUNITY)

## 2023-05-03 ENCOUNTER — Other Ambulatory Visit: Payer: Self-pay

## 2023-05-03 DIAGNOSIS — I6389 Other cerebral infarction: Secondary | ICD-10-CM | POA: Diagnosis not present

## 2023-05-03 DIAGNOSIS — I639 Cerebral infarction, unspecified: Secondary | ICD-10-CM | POA: Diagnosis not present

## 2023-05-03 LAB — ECHOCARDIOGRAM COMPLETE
AR max vel: 1.75 cm2
AV Area VTI: 1.13 cm2
AV Area mean vel: 1.7 cm2
AV Mean grad: 2 mm[Hg]
AV Peak grad: 3.9 mm[Hg]
Ao pk vel: 0.98 m/s
Area-P 1/2: 4.8 cm2
Height: 62 in
MV M vel: 5.81 m/s
MV Peak grad: 135 mm[Hg]
MV VTI: 0.85 cm2
S' Lateral: 2.1 cm
Weight: 1477.96 [oz_av]

## 2023-05-03 LAB — COMPREHENSIVE METABOLIC PANEL
ALT: 12 U/L (ref 0–44)
AST: 17 U/L (ref 15–41)
Albumin: 3.2 g/dL — ABNORMAL LOW (ref 3.5–5.0)
Alkaline Phosphatase: 46 U/L (ref 38–126)
Anion gap: 12 (ref 5–15)
BUN: 16 mg/dL (ref 8–23)
CO2: 22 mmol/L (ref 22–32)
Calcium: 9.5 mg/dL (ref 8.9–10.3)
Chloride: 110 mmol/L (ref 98–111)
Creatinine, Ser: 0.81 mg/dL (ref 0.44–1.00)
GFR, Estimated: >60 mL/min (ref >60)
Glucose, Bld: 137 mg/dL — ABNORMAL HIGH (ref 70–99)
Potassium: 2.9 mmol/L — ABNORMAL LOW (ref 3.5–5.1)
Sodium: 144 mmol/L (ref 135–145)
Total Bilirubin: 1 mg/dL (ref 0.0–1.2)
Total Protein: 5.8 g/dL — ABNORMAL LOW (ref 6.5–8.1)

## 2023-05-03 LAB — CBC
HCT: 38.8 % (ref 36.0–46.0)
Hemoglobin: 12.7 g/dL (ref 12.0–15.0)
MCH: 29.9 pg (ref 26.0–34.0)
MCHC: 32.7 g/dL (ref 30.0–36.0)
MCV: 91.3 fL (ref 80.0–100.0)
Platelets: 215 10*3/uL (ref 150–400)
RBC: 4.25 MIL/uL (ref 3.87–5.11)
RDW: 14.7 % (ref 11.5–15.5)
WBC: 6.4 10*3/uL (ref 4.0–10.5)
nRBC: 0 % (ref 0.0–0.2)

## 2023-05-03 LAB — GLUCOSE, CAPILLARY
Glucose-Capillary: 131 mg/dL — ABNORMAL HIGH (ref 70–99)
Glucose-Capillary: 137 mg/dL — ABNORMAL HIGH (ref 70–99)
Glucose-Capillary: 143 mg/dL — ABNORMAL HIGH (ref 70–99)
Glucose-Capillary: 146 mg/dL — ABNORMAL HIGH (ref 70–99)
Glucose-Capillary: 169 mg/dL — ABNORMAL HIGH (ref 70–99)
Glucose-Capillary: 181 mg/dL — ABNORMAL HIGH (ref 70–99)
Glucose-Capillary: 183 mg/dL — ABNORMAL HIGH (ref 70–99)

## 2023-05-03 MED ORDER — HALOPERIDOL LACTATE 5 MG/ML IJ SOLN
5.0000 mg | Freq: Four times a day (QID) | INTRAMUSCULAR | Status: DC | PRN
  Administered 2023-05-03 – 2023-05-04 (×2): 5 mg via INTRAVENOUS
  Filled 2023-05-03 (×3): qty 1

## 2023-05-03 MED ORDER — POTASSIUM CHLORIDE 20 MEQ PO PACK
60.0000 meq | PACK | Freq: Two times a day (BID) | ORAL | Status: DC
  Administered 2023-05-03: 60 meq
  Filled 2023-05-03: qty 3

## 2023-05-03 MED ORDER — POTASSIUM CHLORIDE 20 MEQ PO PACK
60.0000 meq | PACK | Freq: Two times a day (BID) | ORAL | Status: AC
  Administered 2023-05-04: 60 meq via ORAL
  Filled 2023-05-03: qty 3

## 2023-05-03 NOTE — Plan of Care (Signed)
  Problem: Education: Goal: Ability to describe self-care measures that may prevent or decrease complications (Diabetes Survival Skills Education) will improve Outcome: Progressing Goal: Individualized Educational Video(s) Outcome: Progressing   Problem: Education: Goal: Ability to describe self-care measures that may prevent or decrease complications (Diabetes Survival Skills Education) will improve 05/03/2023 1420 by Genevie Ann, RN Outcome: Progressing 05/03/2023 1417 by Genevie Ann, RN Outcome: Progressing Goal: Individualized Educational Video(s) 05/03/2023 1420 by Genevie Ann, RN Outcome: Progressing 05/03/2023 1417 by Genevie Ann, RN Outcome: Progressing

## 2023-05-03 NOTE — Progress Notes (Signed)
Nutrition Brief Note  Chart reviewed. Pt in memory care unit at baseline and with poor nutrition and low BMI. Now with new CVA and SLP has determined that pt is not safe for PO intake. MD discussed with family who wish to pursue comfort focused care. Pt followed by hospice outpatient and plan is to discharge to a hospice home. SLP notes to allow comfort feeds as pt expresses interest.   No nutrition interventions planned at this time.  Please re-consult as needed.   Greig Castilla, RD, LDN Registered Dietitian II Please reach out via secure chat Weekend on-call pager # available in [facility]

## 2023-05-03 NOTE — Progress Notes (Signed)
  Progress Note   Patient: Elizabeth Novak ZOX:[id number] DOB: 07/17/1923 DOA: 05/02/2023     1 DOS: the patient was seen and examined on 05/03/2023   Brief hospital course: 89 y/o female with PMHx of atrual fibrillation, CHF., T2DM, HLD, HTN, anxiety and depression, dementia, SSS sp PPM, history of CVA with residual right-sided weakness.  Patient's rehab center SNF memory care unit, patient brought in as a code stroke.  Patient with right gaze preference, left hemianopia, left facial droop, left arm weakness, left leg weakness   Presenting vitals 143/65, 28F, 16, afebrile. CT head shows acute right MCA distribution infarct, associated dense right MCA branch at the Sylvian fissure, suspicious for thrombus.  Possible subacute hematoma and/or subacute hemorrhagic stroke in the right cerebellum   Assessment and Plan:  Acute CVA -Patient not a candidate for tPA.  Concern for hemorrhagic conversion and age -Unable to do MRI overnight, patient with pacemaker.   -CT head concerning for R MCA stroke -CTA head and neck with R M2 branch occlusion, inferior division, filling defect w/in the cavernous L ICA -Neurology following. Recs for ASA 81mg  daily and plavix 75mg  daily x 3 weeks, then plavix alone -Pt still unable to safely swallow per SLP. Discussed with POA who states that she and family is in agreement with focus on comfort at this time [facility] following, pending placement at [facility]    T2DM -Sliding scale ordered -A1c 8.7    Atrial fibrillation (HCC) -Not on anticoagulation.   -rate controlled    CAD //  HTN (hypertension) -BP meds were on hold to allow for permissive hypertension.    Sick sinus syndrome (HCC) -S/P placement of cardiac pacemaker    Anxiety with depression //  Alzheimer's dementia -Not on medications    Warm autoimmune hemolytic anemia (HCC)  Post-CVA dysphagia -Did not pass swallow. Since pt is now comfort, will allow comfort feed    Subjective: Pleasantly confused. Wants something to drink  Physical Exam: Vitals:   05/03/23 0028 05/03/23 0445 05/03/23 0744 05/03/23 1127  BP: (!) 148/64 (!) 138/57 121/65 (!) 151/73  Pulse: 67 (!) 55 61 73  Resp: 17 17 16 16   Temp: (!) 97.5 F (36.4 C) (!) 97.5 F (36.4 C) 97.9 F (36.6 C) 98 F (36.7 C)  TempSrc:   Oral Oral  SpO2: 100% 91% 93% 92%  Weight:      Height:       General exam: Conversant, in no acute distress Respiratory system: normal chest rise, clear, no audible wheezing Cardiovascular system: regular rhythm, s1-s2 Gastrointestinal system: Nondistended, nontender, pos BS Central nervous system: No seizures, no tremors Extremities: No cyanosis, no joint deformities Skin: No rashes, no pallor Psychiatry: Affect normal // no auditory hallucinations   Data Reviewed:  Labs reviewed: Na 144, K 2.9, Cr 0.81, hgb 12.7  Family Communication: Pt in room, family not at bedside  Disposition: Status is: Inpatient Remains inpatient appropriate because: severity of illness  Planned Discharge Destination:  Hospice     Author: Rickey Barbara, MD 05/03/2023 3:42 PM  For on call review [email or web address].
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
## 2023-05-03 NOTE — Patient Care Conference (Signed)
Called and updated pt's Niece over the phone. Family is aware of stroke and deficits. More concerning, pt with worse dysphagia in the setting of known malnutrition. Discussed with SLP today, still high aspiration risk with recs for comfort feeds. Per POA, family is unanimous in decision to focus on comfort and are agreeable with allowing comfort feeds. Family is interested in transfer to hospice in [place] if possible. Have discussed with [facility], who is aware, and have also notified TOC. Pt seen and examined. Full note will follow

## 2023-05-03 NOTE — Progress Notes (Signed)
Echocardiogram 2D Echocardiogram has been performed.  Elizabeth Novak 05/03/2023, 11:33 AM

## 2023-05-03 NOTE — Progress Notes (Signed)
TRH night cross cover note:  I was notified by RN that this patient is agitated, repeatedly attempting to get out of bed on her own, in the setting of being a fall risk, with these behaviors refractory to attempts at verbal redirection.  In the setting of associated interference with ongoing medical treatment posing potential harm to themself, I have placed order for prn IV Haldol for agitation.   Newton Pigg, DO Hospitalist

## 2023-05-03 NOTE — Progress Notes (Addendum)
[facility]ized [facility]ice Patient Visit   [name] is a current [facility] patient with a terminal diagnosis of hypertensive heart with chronic kidney disease stage 3 with heart failure. Patient resides at [facility] and was noted to have stroke symptoms and EMS was activated. [facility] was notified of this after patient in route to hospital. She has been admitted on 1.26.25 with a diagnosis of acute CVA. Per Dr. Gordy Savers with [facility] this is a related hospital admission.    Visited patient at bedside and discussed care with attending and niece, [name] by phone. Patient is lethargic but responsive and appreciative of ice chips given. Denies wanting food at this time.   She is inpatient appropriate for IV fluids and ongoing monitoring related to acute CVA.    Vital Signs: 9873/16   151/73   92% on RA   I&O: 467/not documented   Abnormal labs: K+ 2.9, alb 3.2   Diagnostics: none new    IV/PRN Meds: D5NS @ 26ml/hr   Problem List as per Dr. Rickey Barbara   Called and updated pt's Niece over the phone. Family is aware of stroke and deficits. More concerning, pt with worse dysphagia in the setting of known malnutrition. Discussed with SLP today, still high aspiration risk with recs for comfort feeds. Per POA, family is unanimous in decision to focus on comfort and are agreeable with allowing comfort feeds. Family is interested in transfer to hospice in [place] if possible. Have discussed with [facility], who is aware, and have also notified TOC. Pt seen and examined. Full note will follow    Discharge Planning: Ongoing  Family Contact: spoke with [name], niece by phone   IDT: Updated  Goals of Care: DNR   Please don't hesitate to reach out for any hospice related questions or concerns.   Glenna Fellows, BSN, RN, [facility] Hospice hospital liaison [phone]

## 2023-05-03 NOTE — Progress Notes (Signed)
Speech Language Pathology Treatment:    Patient Details Name: Elizabeth Novak MRN: [id number] DOB: 12-Jun-1923 Today's Date: 05/03/2023 Time: 5284-1324 SLP Time Calculation (min) (ACUTE ONLY): 37 min  Assessment / Plan / Recommendation Clinical Impression  Pt presents with continued significantly high risk of aspiration and inadequate PO intake. Pt was awake and alert, cooperative. She allowed oral care with suction after SLP explained rationale (given pt's reported fear of germs reported at yesterday's BSE). She continues to exhibit significant weakness and deconditioning, but accepted PO presentations.   Pt accepted individual ice chips, thin and nectar thick liquid, and puree trials. Immediate reflexive cough response elicited after thin liquids via cup and straw and nectar thick liquid via cup. Pt appeared to tolerate individual ice chips, teaspoon boluses of nectar thick liquid, and small boluses of puree.   Pending establishment of goals of care, recommend either conservative diet of puree with teaspoon boluses of nectar thick liquid with individual ice chips after oral care (which will maximize swallow safety but restrict PO choices), or regular diet to provide full range of PO choices with higher risk of aspiration.     HPI HPI: Patient is a 89 y.o. female with PMH: dementia (resident of memory care ALF), CHF, DM-2,HTN, anxiety, depression, CVA  with residual right sided weakness, dysphagia (2015 esophagram reported: laryngeal penetration of liquids, osteophytes causing mild deformity of posterior wall of esophagus). She presented to the hospital on 05/02/23 as a code stroke with right gaze preference, left hemianopia, left facial droop,, left arm weakness and left leg weakness. CT head showed acute right MCA  infarct and possible subacute hematoma and/or subacute hemorrhagic stroke in right cerebellum. She failed Yale swallow with RN and was kept NPO awaiting SLP swallow evaluation.       SLP Plan   Provide education to family re: maximizing safe swallow       Recommendations for follow up therapy are one component of a multi-disciplinary discharge planning process, led by the attending physician.  Recommendations may be updated based on patient status, additional functional criteria and insurance authorization.    Recommendations  Compensations: Minimize environmental distractions;Slow rate;Small sips/bites (allow frequent rest breaks)        Oral care QID;Staff/trained caregiver to provide oral care     Dysphagia, unspecified (R13.10)          Elizabeth Novak, [facility], CCC-SLP Speech Language Pathologist Office: [phone]  Elizabeth Novak 05/03/2023, 11:20 AM

## 2023-05-04 DIAGNOSIS — I639 Cerebral infarction, unspecified: Secondary | ICD-10-CM | POA: Diagnosis not present

## 2023-05-04 LAB — GLUCOSE, CAPILLARY
Glucose-Capillary: 110 mg/dL — ABNORMAL HIGH (ref 70–99)
Glucose-Capillary: 145 mg/dL — ABNORMAL HIGH (ref 70–99)
Glucose-Capillary: 95 mg/dL (ref 70–99)

## 2023-05-04 MED ORDER — SCOPOLAMINE 1 MG/3DAYS TD PT72
1.0000 | MEDICATED_PATCH | Freq: Once | TRANSDERMAL | Status: DC
  Administered 2023-05-04: 1.5 mg via TRANSDERMAL
  Filled 2023-05-04: qty 1

## 2023-05-04 MED ORDER — SCOPOLAMINE 1 MG/3DAYS TD PT72: 1.0000 | MEDICATED_PATCH | TRANSDERMAL | Status: AC

## 2023-05-04 MED ORDER — ASPIRIN 81 MG PO TBEC: 81.0000 mg | DELAYED_RELEASE_TABLET | Freq: Every day | ORAL | Status: AC

## 2023-05-04 MED ORDER — SCOPOLAMINE 1 MG/3DAYS TD PT72: 1.0000 | MEDICATED_PATCH | TRANSDERMAL | Status: DC

## 2023-05-04 NOTE — Progress Notes (Signed)
[facility] Hospitalized [facility] Patient Visit   [name] is approved for Routine LOC at [facility] inpatient hospice facility. We are offering a bed for today.   [name], her niece, attending, and TOC are aware.   Glenna Fellows, BSN, RN, [facility] Hospice hospital liaison [phone]

## 2023-05-04 NOTE — Progress Notes (Signed)
Called and gave report to facility. PIV to remain per receiving RN.

## 2023-05-04 NOTE — Progress Notes (Signed)
Physical Therapy Discharge Patient Details Name: Elizabeth Novak MRN: [id number] DOB: [birth year] Today's Date: 05/04/2023 Time:  -     Patient discharged from PT services secondary to  pt has transitioned to comfort care .  Please see latest therapy progress note for current level of functioning and progress toward goals.    Progress and discharge plan discussed with patient and/or caregiver: Patient unable to participate in discharge planning and no caregivers available  GP     Jerolyn Center, PT Acute Rehabilitation Services  Office [phone]  Zena Amos 05/04/2023, 7:58 AM

## 2023-05-04 NOTE — TOC Transition Note (Signed)
Transition of Care [facility]) - Discharge Note   Patient Details  Name: Elizabeth Novak MRN: [id number] Date of Birth: [birth year]  [facility] CM/SW Contact:  Baldemar Lenis, LCSW Phone Number: 05/04/2023, 1:09 PM   Clinical Narrative:   CSW updated by hospice liaison that bed is available for patient today. Patient medically stable, discharge complete. Transport arranged with [facility] for next available. CSW updated niece, [name], who is in agreement. No further TOC needs.    Final next level of care: Hospice Medical Facility Barriers to Discharge: Barriers Resolved   Patient Goals and CMS Choice Patient states their goals for this hospitalization and ongoing recovery are:: patient unable to participate in goal setting, not fully oriented CMS Medicare.gov Compare Post Acute Care list provided to:: Patient Represenative (must comment) Choice offered to / list presented to : [facility] POA / Guardian      Discharge Placement              Patient chooses bed at:  [facility] in [place]) Patient to be transferred to facility by: [facility] Name of family member notified: [name] Patient and family notified of of transfer: 05/04/23  Discharge Plan and Services Additional resources added to the After Visit Summary for       Post Acute Care Choice: Hospice                               Social Drivers of Health (SDOH) Interventions SDOH Screenings   Housing: Unknown (05/04/2023)  Utilities: Patient Unable To Answer (05/04/2023)  Financial Resource Strain: Low Risk  (08/18/2018)   Received [facility], [facility]  Physical Activity: Unknown (08/18/2018)   Received from [facility], [facility]  Social Connections: Unknown (05/04/2023)  Stress: No Stress Concern Present (08/18/2018)   Received from [facility], [facility]  Tobacco Use: Low Risk   (05/02/2023)     Readmission Risk Interventions     No data to display

## 2023-05-04 NOTE — Plan of Care (Signed)
  Problem: Skin Integrity: Goal: Risk for impaired skin integrity will decrease Outcome: Progressing   Problem: Coping: Goal: Level of anxiety will decrease Outcome: Progressing   Problem: Safety: Goal: Ability to remain free from injury will improve Outcome: Progressing

## 2023-05-04 NOTE — Progress Notes (Signed)
TRH night cross cover note:   I was notified by RN that the patient is reporting coughing and the sensation of choking.  Suspect that this is aspiration of secretions as a result of dysphagia from recent stroke, with family electing for emphasis on comfort measures, including comfort feeds.  To help her manage the secretions from a symptomatic standpoint, I have added a scopolamine patch for her.    Newton Pigg, DO Hospitalist

## 2023-05-04 NOTE — Progress Notes (Signed)
Patient complains of catching her breath, checked vital signs and informed Dr. Dalene Carrow. Placed patient on O2 for support.

## 2023-05-04 NOTE — Progress Notes (Signed)
SLP Cancellation Note  Patient Details Name: Elizabeth Novak MRN: [id number] DOB: 10/28/1923   Cancelled treatment:       Reason Eval/Treat Not Completed: Other (comment) Patient has transitioned to comfort care/PO's. SLP to s/o at this time  Angela Nevin, MA, CCC-SLP Speech Therapy

## 2023-05-04 NOTE — Progress Notes (Signed)
Patient starts to cough, complains of choking sensation. Notified Dr. Dalene Carrow.

## 2023-05-04 NOTE — Progress Notes (Signed)
Patient is agitated and trying to get out of bed. Very confused, wants to get out of the hospital.  Re oriented the patient several times. Questions from the patient repetitive.

## 2023-05-04 NOTE — Discharge Summary (Signed)
Physician Discharge Summary   Patient: Elizabeth Novak MRN: [id number] DOB: [birth year]  Admit date:     05/02/2023  Discharge date: 05/04/23  Discharge Physician: Rickey Barbara   PCP: Diona Browner [facility]   Recommendations at discharge:    Follow up with hospice as needed  Discharge Diagnoses: Principal Problem:   Acute CVA (cerebrovascular accident) [facility]) Active Problems:   Atrial fibrillation (HCC)   (HFpEF) heart failure with preserved ejection fraction (HCC)   Sick sinus syndrome (HCC)   Warm autoimmune hemolytic anemia (HCC)   Acute ischemic right MCA stroke (HCC)   Anxiety with depression   Coronary artery disease involving native coronary artery without angina pectoris   Right hemiparesis (HCC)   S/P placement of cardiac pacemaker   HTN (hypertension)   Diabetes mellitus without complication (HCC)  Resolved Problems:   * No resolved hospital problems. *  Hospital Course: 89 y/o female with PMHx of atrual fibrillation, CHF., T2DM, HLD, HTN, anxiety and depression, dementia, SSS sp PPM, history of CVA with residual right-sided weakness.  Patient's rehab center SNF memory care unit, patient brought in as a code stroke.  Patient with right gaze preference, left hemianopia, left facial droop, left arm weakness, left leg weakness   Presenting vitals 143/65, 62F, 16, afebrile. CT head shows acute right MCA distribution infarct, associated dense right MCA branch at the Sylvian fissure, suspicious for thrombus.  Possible subacute hematoma and/or subacute hemorrhagic stroke in the right cerebellum   Assessment and Plan:  Acute CVA -Patient not a candidate for tPA.  Concern for hemorrhagic conversion and age -Unable to do MRI overnight, patient with pacemaker.   -CT head concerning for R MCA stroke -CTA head and neck with R M2 branch occlusion, inferior division, filling defect w/in the cavernous L ICA -Neurology following. Recs were for ASA 81mg   daily and plavix 75mg  daily x 3 weeks, then plavix alone -Pt is still unable to safely swallow per SLP. Discussed with POA who states that she and family is in agreement with focus on comfort at this time [facility] following, pending placement at [facility]    T2DM -Sliding scale given this admission -A1c 8.7    Atrial fibrillation (HCC) -Not on anticoagulation.   -rate controlled this visit    CAD //  HTN (hypertension) -BP meds were on hold to allow for permissive hypertension.    Sick sinus syndrome (HCC) -S/P placement of cardiac pacemaker    Anxiety with depression //  Alzheimer's dementia -Not on medications    Warm autoimmune hemolytic anemia (HCC)   Post-CVA dysphagia -Did not pass swallow. Since pt is now comfort, will allow comfort feed        Consultants: Neurology, Hospice Procedures performed:   Disposition: Hospice care Diet recommendation:  Regular diet DISCHARGE MEDICATION: Allergies as of 05/04/2023       Reactions   Codeine Nausea Only   Disopyramide    Other reaction(s): Unknown   Ibuprofen Diarrhea   Iodine    blisters   Metformin And Related Other (See Comments)   unknown   Mirtazapine Other (See Comments)   None provided   Norpace [disopyramide Phosphate]    Quinidine    Other reaction(s): Unknown   Terfenadine    Other reaction(s): Unknown   Topiramate    Other reaction(s): Other (See Comments) Hair loss   Verapamil    Other reaction(s): Unknown   Dexamethasone Sodium Phosphate Palpitations        Medication  List     STOP taking these medications    allopurinol 100 MG tablet Commonly known as: ZYLOPRIM   Anti-Diarrheal 2 MG tablet Generic drug: loperamide   busPIRone 5 MG tablet Commonly known as: BUSPAR   diclofenac Sodium 1 % Gel Commonly known as: VOLTAREN   docusate sodium 100 MG capsule Commonly known as: COLACE   famotidine 20 MG tablet Commonly known as: PEPCID   isosorbide dinitrate  10 MG tablet Commonly known as: ISORDIL   Lantus SoloStar 100 UNIT/ML Solostar Pen Generic drug: insulin glargine   metoprolol tartrate 25 MG tablet Commonly known as: LOPRESSOR   phenylephrine-shark liver oil-mineral oil-petrolatum 0.25-14-74.9 % rectal ointment Commonly known as: PREPARATION H   polyethylene glycol 17 g packet Commonly known as: MIRALAX / GLYCOLAX   potassium chloride 10 MEQ CR capsule Commonly known as: MICRO-K   senna 8.6 MG Tabs tablet Commonly known as: SENOKOT   sertraline 100 MG tablet Commonly known as: ZOLOFT   simethicone 125 MG chewable tablet Commonly known as: MYLICON   torsemide 20 MG tablet Commonly known as: DEMADEX       TAKE these medications    acetaminophen 325 MG tablet Commonly known as: TYLENOL Take 650 mg by mouth every 6 (six) hours as needed for mild pain (pain score 1-3).   acetaminophen 325 MG tablet Commonly known as: TYLENOL Take 2 tablets (650 mg total) by mouth every 6 (six) hours as needed for mild pain.   aspirin EC 81 MG tablet Take 1 tablet (81 mg total) by mouth daily. Swallow whole. Start taking on: May 05, 2023   digoxin 0.125 MG tablet Commonly known as: LANOXIN Take 1 tablet (0.125 mg total) by mouth daily.   LORazepam 0.5 MG tablet Commonly known as: ATIVAN Take 0.25 mg by mouth every 12 (twelve) hours as needed for anxiety.   nitroGLYCERIN 0.4 MG SL tablet Commonly known as: NITROSTAT Place 0.4 mg under the tongue every 5 (five) minutes as needed for chest pain.   scopolamine 1 MG/3DAYS Commonly known as: TRANSDERM-SCOP Place 1 patch (1.5 mg total) onto the skin every 3 (three) days. Start taking on: May 07, 2023   sodium chloride 0.65 % Soln nasal spray Commonly known as: OCEAN Place 2 sprays into both nostrils every 6 (six) hours as needed for congestion.   traMADol 50 MG tablet Commonly known as: ULTRAM Take 50 mg by mouth every 6 (six) hours as needed for moderate pain (pain  score 4-6).        Follow-up Information     Follow up with hospice services Follow up.   Why: As needed               Discharge Exam: Filed Weights   05/02/23 0000 05/02/23 2108  Weight: 45.2 kg 41.9 kg   General exam: Awake, laying in bed, in nad Respiratory system: Normal respiratory effort, no audible wheezing Cardiovascular system: perfused, no notable JVD Gastrointestinal system: Soft, nondistended, positive BS Central nervous system: CN2-12 grossly intact, strength intact Extremities: Perfused, no clubbing Skin: Normal skin turgor, no notable skin lesions seen Psychiatry: Unable to assess given deficits  Condition at discharge: fair  The results of significant diagnostics from this hospitalization (including imaging, microbiology, ancillary and laboratory) are listed below for reference.   Imaging Studies: ECHOCARDIOGRAM COMPLETE Result Date: 05/03/2023    ECHOCARDIOGRAM REPORT   Patient Name:   Megumi Treaster Date of Exam: 05/03/2023 Medical Rec #:  [id number]  Height:       62.0 in Accession #:    [id number]         Weight:       92.4 lb Date of Birth:  [birth year]          BSA:          1.377 m Patient Age:    89 years           BP:           121/65 mmHg Patient Gender: F                  HR:           65 bpm. Exam Location:  Inpatient Procedure: 2D Echo, Cardiac Doppler and Color Doppler Indications:    TIA  History:        Patient has prior history of Echocardiogram examinations, most                 recent 07/26/2021.  Sonographer:    Karma Ganja Referring Phys: Gery Pray  Sonographer Comments: Technically difficult study due to poor echo windows. Image acquisition challenging due to patient body habitus and Image acquisition challenging due to uncooperative patient. IMPRESSIONS  1. Left ventricular ejection fraction, by estimation, is 65 to 70%. The left ventricle has normal function. The left ventricle has no regional wall motion abnormalities. There  is mild concentric left ventricular hypertrophy. Left ventricular diastolic function could not be evaluated.  2. Right ventricular systolic function is normal. The right ventricular size is normal. There is moderately elevated pulmonary artery systolic pressure. The estimated right ventricular systolic pressure is 59.9 mmHg.  3. Left atrial size was severely dilated.  4. Right atrial size was severely dilated.  5. The mitral valve is myxomatous. Moderate mitral valve regurgitation. No evidence of mitral stenosis. There is mild prolapse of both leaflets of the mitral valve.  6. Tricuspid valve regurgitation is severe.  7. The aortic valve is tricuspid. There is moderate calcification of the aortic valve. Aortic valve regurgitation is moderate. Aortic valve sclerosis/calcification is present, without any evidence of aortic stenosis. Aortic valve area, by VTI measures 1.13 cm. Aortic valve mean gradient measures 2.0 mmHg. Aortic valve Vmax measures 0.98 m/s.  8. The inferior vena cava is dilated in size with <50% respiratory variability, suggesting right atrial pressure of 15 mmHg. FINDINGS  Left Ventricle: Left ventricular ejection fraction, by estimation, is 65 to 70%. The left ventricle has normal function. The left ventricle has no regional wall motion abnormalities. The left ventricular internal cavity size was normal in size. There is  mild concentric left ventricular hypertrophy. Left ventricular diastolic function could not be evaluated due to atrial fibrillation. Left ventricular diastolic function could not be evaluated. Right Ventricle: The right ventricular size is normal. No increase in right ventricular wall thickness. Right ventricular systolic function is normal. There is moderately elevated pulmonary artery systolic pressure. The tricuspid regurgitant velocity is 3.35 m/s, and with an assumed right atrial pressure of 15 mmHg, the estimated right ventricular systolic pressure is 59.9 mmHg. Left Atrium:  Left atrial size was severely dilated. Right Atrium: Right atrial size was severely dilated. Pericardium: Trivial pericardial effusion is present. The pericardial effusion is posterior to the left ventricle. Mitral Valve: The mitral valve is myxomatous. There is mild prolapse of both leaflets of the mitral valve. Moderate mitral valve regurgitation. No evidence of mitral valve stenosis. MV peak gradient, 7.0 mmHg. The mean mitral valve  gradient is 2.0 mmHg. Tricuspid Valve: The tricuspid valve is normal in structure. Tricuspid valve regurgitation is severe. No evidence of tricuspid stenosis. Aortic Valve: The aortic valve is tricuspid. There is moderate calcification of the aortic valve. Aortic valve regurgitation is moderate. Aortic valve sclerosis/calcification is present, without any evidence of aortic stenosis. Aortic valve mean gradient  measures 2.0 mmHg. Aortic valve peak gradient measures 3.9 mmHg. Aortic valve area, by VTI measures 1.13 cm. Pulmonic Valve: The pulmonic valve was normal in structure. Pulmonic valve regurgitation is mild. No evidence of pulmonic stenosis. Aorta: The aortic root is normal in size and structure. Venous: The inferior vena cava is dilated in size with less than 50% respiratory variability, suggesting right atrial pressure of 15 mmHg. IAS/Shunts: No atrial level shunt detected by color flow Doppler. Additional Comments: A device lead is visualized.  LEFT VENTRICLE PLAX 2D LVIDd:         4.20 cm   Diastology LVIDs:         2.10 cm   LV e' medial:    8.05 cm/s LV PW:         1.00 cm   LV E/e' medial:  10.4 LV IVS:        0.90 cm   LV e' lateral:   14.60 cm/s LVOT diam:     1.90 cm   LV E/e' lateral: 5.7 LV SV:         27 LV SV Index:   19 LVOT Area:     2.84 cm  RIGHT VENTRICLE            IVC RV Basal diam:  4.10 cm    IVC diam: 2.70 cm RV S prime:     8.49 cm/s TAPSE (M-mode): 1.3 cm LEFT ATRIUM            Index         RIGHT ATRIUM           Index LA diam:      6.10 cm  4.43  cm/m    RA Area:     36.30 cm LA Vol (A2C): 393.0 ml 285.47 ml/m  RA Volume:   142.00 ml 103.15 ml/m LA Vol (A4C): 185.0 ml 134.38 ml/m  AORTIC VALVE AV Area (Vmax):    1.75 cm AV Area (Vmean):   1.70 cm AV Area (VTI):     1.13 cm AV Vmax:           98.30 cm/s AV Vmean:          60.500 cm/s AV VTI:            0.235 m AV Peak Grad:      3.9 mmHg AV Mean Grad:      2.0 mmHg LVOT Vmax:         60.70 cm/s LVOT Vmean:        36.300 cm/s LVOT VTI:          0.094 m LVOT/AV VTI ratio: 0.40  AORTA Ao Root diam: 2.90 cm Ao Asc diam:  2.90 cm MITRAL VALVE               TRICUSPID VALVE MV Area (PHT): 4.80 cm    TR Peak grad:   44.9 mmHg MV Area VTI:   0.85 cm    TR Vmax:        335.00 cm/s MV Peak grad:  7.0 mmHg MV Mean grad:  2.0 mmHg    SHUNTS MV Vmax:  1.32 m/s    Systemic VTI:  0.09 m MV Vmean:      67.9 cm/s   Systemic Diam: 1.90 cm MV Decel Time: 158 msec MR Peak grad: 135.0 mmHg MR Vmax:      581.00 cm/s MV E velocity: 83.80 cm/s Arvilla Meres MD Electronically signed by Arvilla Meres MD Signature Date/Time: 05/03/2023/11:46:10 AM    Final    DG Elbow 2 Views Right Result Date: 05/02/2023 CLINICAL DATA:  Right elbow pain.  Unable to straighten elbow. EXAM: RIGHT ELBOW - 2 VIEW COMPARISON:  None Available. FINDINGS: The bones are diffusely under mineralized. No evidence of acute fracture. Elbow alignment is normal on the lateral view, suboptimally assessed on the AP view due to difficulty with positioning. No frank dislocation. No erosive change or focal bone abnormality. No elbow joint effusion. IMPRESSION: 1. No acute fracture or dislocation of the right elbow. 2. Osteopenia/osteoporosis. Electronically Signed   By: Narda Rutherford M.D.   On: 05/02/2023 19:42   CT ANGIO HEAD NECK W WO CM Result Date: 05/02/2023 CLINICAL DATA:  Initial evaluation for acute neuro deficit, stroke suspected. EXAM: CT ANGIOGRAPHY HEAD AND NECK WITH AND WITHOUT CONTRAST TECHNIQUE: Multidetector CT imaging of the  head and neck was performed using the standard protocol during bolus administration of intravenous contrast. Multiplanar CT image reconstructions and MIPs were obtained to evaluate the vascular anatomy. Carotid stenosis measurements (when applicable) are obtained utilizing NASCET criteria, using the distal internal carotid diameter as the denominator. RADIATION DOSE REDUCTION: This exam was performed according to the departmental dose-optimization program which includes automated exposure control, adjustment of the mA and/or kV according to patient size and/or use of iterative reconstruction technique. CONTRAST:  75mL OMNIPAQUE IOHEXOL 350 MG/ML SOLN COMPARISON:  Prior CT from earlier the same day. FINDINGS: CTA NECK FINDINGS Aortic arch: Visualized aortic arch ectatic measuring up to 3.1 cm. Standard 3 vessel morphology. Moderate aortic atherosclerosis. No high-grade stenosis about the origin the great vessels. Right carotid system: Right common and internal carotid arteries are tortuous with patent without dissection. Moderate atheromatous change about the right carotid bulb without hemodynamically significant greater than 50% stenosis. Left carotid system: Left common and internal carotid arteries are tortuous but patent without dissection. Moderate atheromatous change about the left carotid bulb with up to 60% stenosis by NASCET criteria. Vertebral arteries: Both vertebral arteries arise from the subclavian arteries. No significant proximal subclavian artery stenosis. Atheromatous change at the origin of the right vertebral artery with moderate stenosis. Vertebral arteries patent distally without significant stenosis or dissection. Skeleton: No discrete or worrisome osseous lesions. Exaggeration of the normal cervical lordosis with mild spondylosis. Other neck: No other acute finding. Upper chest: No other acute finding. Left-sided pacemaker/AICD in place. Review of the MIP images confirms the above findings CTA  HEAD FINDINGS Anterior circulation: Atheromatous change about the carotid siphons with up to mild narrowing bilaterally. Filling defect within the cavernous left ICA suspicious for subocclusive thrombus (series 5, images 103-106). A1 segments patent. Normal anterior communicating artery complex. Anterior cerebral arteries patent without stenosis. Left M1 segment and distal left MCA branches patent and well perfused. Right M1 segment patent. There is acute occlusion of a proximal right M2 branch, inferior division (series 5, image 92). Relatively minimal/scant collateral flow seen within the right MCA distribution distally. Finding corresponds with hyperdense vessel seen on prior noncontrast head CT. Posterior circulation: Both V4 segments patent without stenosis. Left vertebral artery dominant. Both PICA patent. Basilar patent without stenosis. Superior  cerebral arteries patent bilaterally. Both PCAs primarily supplied via the basilar. PCAs patent to their distal aspects without significant stenosis. Venous sinuses: Patent allowing for timing of the contrast bolus. Anatomic variants: As above. No intracranial aneurysm. 8 mm enhancing lesion overlying the left frontal convexity (series 5, image 38). An additional 1.3 cm enhancing lesion also seen overlying the anterior left frontal convexity slightly inferiorly (series 5, image 62). Findings favored to reflect meningiomas. No vascular malformation seen underlying the previously identified right cerebellar hemorrhage. Review of the MIP images confirms the above findings IMPRESSION: 1. Acute proximal right M2 branch occlusion, inferior division. 2. Filling defect within the cavernous left ICA, likely subocclusive thrombus. 3. No vascular abnormality seen underlying the previously identified right cerebellar hemorrhage. 4. Moderate atheromatous change about the carotid bifurcations with up to 60% stenosis on the left. 5. Moderate atheromatous stenosis at the origin of  the right vertebral artery. 6. Two separate enhancing lesions overlying the left frontal convexity, likely small meningiomas. 7.  Aortic Atherosclerosis (ICD10-I70.0). These results were communicated to Dr. Otelia Limes at 4:09 am on 05/02/2023 by text page via the [facility] messaging system. Electronically Signed   By: Rise Mu M.D.   On: 05/02/2023 04:13   CT HEAD CODE STROKE WO CONTRAST Result Date: 05/02/2023 CLINICAL DATA:  Code stroke. Initial evaluation for acute neuro deficit, stroke suspected. EXAM: CT HEAD WITHOUT CONTRAST TECHNIQUE: Contiguous axial images were obtained from the base of the skull through the vertex without intravenous contrast. RADIATION DOSE REDUCTION: This exam was performed according to the departmental dose-optimization program which includes automated exposure control, adjustment of the mA and/or kV according to patient size and/or use of iterative reconstruction technique. COMPARISON:  CT from 08/01/2021. FINDINGS: Brain: Age-related cerebral atrophy with chronic microvascular ischemic disease. Small remote left cerebellar infarct noted. Remote infarcts involving the right frontal and parietal lobes noted. There is subtle loss of gray-white matter differentiation involving the right insular cortex, suspicious for an evolving right MCA distribution infarct. Basal ganglia maintained. Few additional scattered patchy hypodensities noted involving the overlying right frontal region (series 2, images 24, 25). Patchy hypodensity at the posterior right parietal region (series 2, image 23). 3.3 cm hypodense area with heterogeneous central hyperdensity present within the right cerebellum (series 2, image 11). Finding favored to reflect an evolving subacute hematoma and/or hemorrhagic stroke. Underlying mass not excluded. No other acute intracranial hemorrhage or mass lesion. No hydrocephalus or extra-axial fluid collection. Vascular: Dense right MCA branch at the sylvian fissure,  suspicious for thrombus (series 2, image 15). Skull: Scalp soft tissues within normal limits.  Calvarium intact. Sinuses/Orbits: Right gaze noted. Paranasal sinuses and mastoid air cells are largely clear. Other: None. ASPECTS [facility] Stroke Program Early CT Score) - Ganglionic level infarction (caudate, lentiform nuclei, internal capsule, insula, M1-M3 cortex): 6 - Supraganglionic infarction (M4-M6 cortex): 1 Total score (0-10 with 10 being normal): 7 IMPRESSION: 1. Findings concerning for acute right MCA distribution infarct as above. Associated dense right MCA branch at the Sylvian fissure, suspicious for thrombus. 2. Aspects = 7. 3. 3.3 cm hypodense area with heterogeneous central hyperdensity within the right cerebellum. Finding favored to reflect an evolving subacute hematoma and/or subacute hemorrhagic stroke. Underlying mass not excluded. 4. Underlying age-related cerebral atrophy with chronic microvascular ischemic disease. These results were communicated to Dr. Otelia Limes at 1:01 am on 05/02/2023 by text page via the [facility] messaging system. Electronically Signed   By: Rise Mu M.D.   On: 05/02/2023 01:04  Microbiology: Results for orders placed or performed during the hospital encounter of 02/05/22  Resp Panel by RT-PCR (Flu A&B, Covid) Urine, Clean Catch     Status: None   Collection Time: 02/05/22 11:34 AM   Specimen: Urine, Clean Catch; Nasal Swab  Result Value Ref Range Status   SARS Coronavirus 2 by RT PCR NEGATIVE NEGATIVE Final    Comment: (NOTE) SARS-CoV-2 target nucleic acids are NOT DETECTED.  The SARS-CoV-2 RNA is generally detectable in upper respiratory specimens during the acute phase of infection. The lowest concentration of SARS-CoV-2 viral copies this assay can detect is 138 copies/mL. A negative result does not preclude SARS-Cov-2 infection and should not be used as the sole basis for treatment or other patient management decisions. A negative result may occur  with  improper specimen collection/handling, submission of specimen other than nasopharyngeal swab, presence of viral mutation(s) within the areas targeted by this assay, and inadequate number of viral copies(<138 copies/mL). A negative result must be combined with clinical observations, patient history, and epidemiological information. The expected result is Negative.  Fact Sheet for Patients:  BloggerCourse.com  Fact Sheet for Healthcare Providers:  SeriousBroker.it  This test is no t yet approved or cleared by the [place] FDA and  has been authorized for detection and/or diagnosis of SARS-CoV-2 by FDA under an Emergency Use Authorization (EUA). This EUA will remain  in effect (meaning this test can be used) for the duration of the COVID-19 declaration under Section 564(b)(1) of the Act, 21 U.S.C.section 360bbb-3(b)(1), unless the authorization is terminated  or revoked sooner.       Influenza A by PCR NEGATIVE NEGATIVE Final   Influenza B by PCR NEGATIVE NEGATIVE Final    Comment: (NOTE) The Xpert Xpress SARS-CoV-2/FLU/RSV plus assay is intended as an aid in the diagnosis of influenza from Nasopharyngeal swab specimens and should not be used as a sole basis for treatment. Nasal washings and aspirates are unacceptable for Xpert Xpress SARS-CoV-2/FLU/RSV testing.  Fact Sheet for Patients: BloggerCourse.com  Fact Sheet for Healthcare Providers: SeriousBroker.it  This test is not yet approved or cleared by the [place] FDA and has been authorized for detection and/or diagnosis of SARS-CoV-2 by FDA under an Emergency Use Authorization (EUA). This EUA will remain in effect (meaning this test can be used) for the duration of the COVID-19 declaration under Section 564(b)(1) of the Act, 21 U.S.C. section 360bbb-3(b)(1), unless the authorization is terminated  or revoked.  Performed at [facility], [address] [place], [place] [address]     Labs: CBC: Recent Labs  Lab 05/02/23 0028 05/02/23 0033 05/03/23 0638  WBC 6.1  --  6.4  NEUTROABS 4.1  --   --   HGB 12.2 12.9 12.7  HCT 39.6 38.0 38.8  MCV 96.6  --  91.3  PLT 205  --  215   Basic Metabolic Panel: Recent Labs  Lab 05/02/23 0028 05/02/23 0033 05/02/23 0357 05/03/23 0638  NA 135 136 137 144  K 3.8 3.8 2.8* 2.9*  CL 105 103 107 110  CO2 18*  --  22 22  GLUCOSE 190* 181* 139* 137*  BUN 24* 28* 21 16  CREATININE 1.02* 1.10* 0.86 0.81  CALCIUM 9.4  --  8.7* 9.5   Liver Function Tests: Recent Labs  Lab 05/02/23 0028 05/02/23 0357 05/03/23 0638  AST 20 18 17   ALT 14 14 12   ALKPHOS 53 49 46  BILITOT 0.6 0.5 1.0  PROT 5.8* 5.6* 5.8*  ALBUMIN 3.3* 3.0* 3.2*   CBG: Recent Labs  Lab 05/03/23 2001 05/03/23 2341 05/04/23 0337 05/04/23 0745 05/04/23 1111  GLUCAP 181* 131* 110* 145* 95    Discharge time spent: less than 30 minutes.  Signed: Rickey Barbara, MD [facility] 05/04/2023

## 2023-06-05 DEATH — deceased
